# Patient Record
Sex: Female | Born: 1935 | Race: White | Hispanic: No | Smoking: Never smoker
Health system: Southern US, Community
[De-identification: ages and names within clinical notes are randomized; demographics above are authoritative.]

## PROBLEM LIST (undated history)

## (undated) DIAGNOSIS — I10 Essential (primary) hypertension: Secondary | ICD-10-CM

## (undated) DIAGNOSIS — E079 Disorder of thyroid, unspecified: Secondary | ICD-10-CM

## (undated) HISTORY — PX: ABDOMINAL HYSTERECTOMY: SHX81

---

## 2014-09-01 ENCOUNTER — Encounter (HOSPITAL_COMMUNITY): Payer: Self-pay | Admitting: Emergency Medicine

## 2014-09-01 ENCOUNTER — Inpatient Hospital Stay (HOSPITAL_COMMUNITY)
Admission: EM | Admit: 2014-09-01 | Discharge: 2014-09-05 | DRG: 482 | Disposition: A | Discharge status: Skilled Nursing Facility | Payer: Medicare Other | Attending: Internal Medicine | Admitting: Internal Medicine

## 2014-09-01 ENCOUNTER — Emergency Department (HOSPITAL_COMMUNITY): Payer: Medicare Other

## 2014-09-01 DIAGNOSIS — E039 Hypothyroidism, unspecified: Secondary | ICD-10-CM | POA: Diagnosis present

## 2014-09-01 DIAGNOSIS — Y9289 Other specified places as the place of occurrence of the external cause: Secondary | ICD-10-CM

## 2014-09-01 DIAGNOSIS — I10 Essential (primary) hypertension: Secondary | ICD-10-CM | POA: Diagnosis present

## 2014-09-01 DIAGNOSIS — W19XXXA Unspecified fall, initial encounter: Secondary | ICD-10-CM

## 2014-09-01 DIAGNOSIS — S72142A Displaced intertrochanteric fracture of left femur, initial encounter for closed fracture: Secondary | ICD-10-CM | POA: Diagnosis not present

## 2014-09-01 DIAGNOSIS — S72002D Fracture of unspecified part of neck of left femur, subsequent encounter for closed fracture with routine healing: Secondary | ICD-10-CM | POA: Diagnosis not present

## 2014-09-01 DIAGNOSIS — W010XXA Fall on same level from slipping, tripping and stumbling without subsequent striking against object, initial encounter: Secondary | ICD-10-CM | POA: Diagnosis present

## 2014-09-01 DIAGNOSIS — T148XXA Other injury of unspecified body region, initial encounter: Secondary | ICD-10-CM

## 2014-09-01 DIAGNOSIS — E038 Other specified hypothyroidism: Secondary | ICD-10-CM | POA: Diagnosis not present

## 2014-09-01 DIAGNOSIS — S72002A Fracture of unspecified part of neck of left femur, initial encounter for closed fracture: Secondary | ICD-10-CM | POA: Diagnosis not present

## 2014-09-01 DIAGNOSIS — Y93H2 Activity, gardening and landscaping: Secondary | ICD-10-CM | POA: Diagnosis not present

## 2014-09-01 DIAGNOSIS — M25552 Pain in left hip: Secondary | ICD-10-CM | POA: Diagnosis present

## 2014-09-01 HISTORY — DX: Disorder of thyroid, unspecified: E07.9

## 2014-09-01 LAB — CBC WITH DIFFERENTIAL/PLATELET
Basophils Absolute: 0 10*3/uL (ref 0.0–0.1)
Basophils Relative: 0 % (ref 0–1)
EOS ABS: 0 10*3/uL (ref 0.0–0.7)
Eosinophils Relative: 0 % (ref 0–5)
HCT: 41.2 % (ref 36.0–46.0)
Hemoglobin: 13.9 g/dL (ref 12.0–15.0)
LYMPHS PCT: 7 % — AB (ref 12–46)
Lymphs Abs: 1 10*3/uL (ref 0.7–4.0)
MCH: 31 pg (ref 26.0–34.0)
MCHC: 33.7 g/dL (ref 30.0–36.0)
MCV: 91.8 fL (ref 78.0–100.0)
MONOS PCT: 4 % (ref 3–12)
Monocytes Absolute: 0.5 10*3/uL (ref 0.1–1.0)
Neutro Abs: 12 10*3/uL — ABNORMAL HIGH (ref 1.7–7.7)
Neutrophils Relative %: 89 % — ABNORMAL HIGH (ref 43–77)
Platelets: 294 10*3/uL (ref 150–400)
RBC: 4.49 MIL/uL (ref 3.87–5.11)
RDW: 13.6 % (ref 11.5–15.5)
WBC: 13.6 10*3/uL — AB (ref 4.0–10.5)

## 2014-09-01 LAB — BASIC METABOLIC PANEL
Anion gap: 11 (ref 5–15)
BUN: 21 mg/dL — AB (ref 6–20)
CALCIUM: 9.4 mg/dL (ref 8.9–10.3)
CO2: 25 mmol/L (ref 22–32)
Chloride: 104 mmol/L (ref 101–111)
Creatinine, Ser: 1.11 mg/dL — ABNORMAL HIGH (ref 0.44–1.00)
GFR calc Af Amer: 51 mL/min — ABNORMAL LOW (ref >60)
GFR calc non Af Amer: 44 mL/min — ABNORMAL LOW (ref >60)
Glucose, Bld: 118 mg/dL — ABNORMAL HIGH (ref 65–99)
Potassium: 3.9 mmol/L (ref 3.5–5.1)
SODIUM: 140 mmol/L (ref 135–145)

## 2014-09-01 LAB — TSH: TSH: 0.571 u[IU]/mL (ref 0.350–4.500)

## 2014-09-01 MED ORDER — ONDANSETRON HCL 4 MG/2ML IJ SOLN: 4.0000 mg | Freq: Four times a day (QID) | INTRAMUSCULAR | Status: DC | PRN

## 2014-09-01 MED ORDER — SODIUM CHLORIDE 0.9 % IV SOLN
INTRAVENOUS | Status: DC
  Administered 2014-09-01 – 2014-09-04 (×6): via INTRAVENOUS

## 2014-09-01 MED ORDER — HYDROCODONE-ACETAMINOPHEN 5-325 MG PO TABS
1.0000 | ORAL_TABLET | ORAL | Status: AC | PRN
  Administered 2014-09-01: 2 via ORAL
  Filled 2014-09-01: qty 2

## 2014-09-01 MED ORDER — DOXAZOSIN MESYLATE 2 MG PO TABS
4.0000 mg | ORAL_TABLET | Freq: Every day | ORAL | Status: DC
  Administered 2014-09-02 – 2014-09-05 (×4): 4 mg via ORAL
  Filled 2014-09-01 (×2): qty 2
  Filled 2014-09-01: qty 1
  Filled 2014-09-01: qty 2
  Filled 2014-09-01: qty 1
  Filled 2014-09-01: qty 2

## 2014-09-01 MED ORDER — ONDANSETRON HCL 4 MG PO TABS: 4.0000 mg | ORAL_TABLET | Freq: Four times a day (QID) | ORAL | Status: DC | PRN

## 2014-09-01 MED ORDER — LEVOTHYROXINE SODIUM 25 MCG PO TABS: 125.0000 ug | ORAL_TABLET | Freq: Every day | ORAL | Status: DC

## 2014-09-01 MED ORDER — TRIAMTERENE-HCTZ 37.5-25 MG PO TABS
0.5000 | ORAL_TABLET | Freq: Every day | ORAL | Status: DC
  Administered 2014-09-02 – 2014-09-05 (×4): 0.5 via ORAL
  Filled 2014-09-01: qty 1
  Filled 2014-09-01: qty 0.5
  Filled 2014-09-01 (×2): qty 1
  Filled 2014-09-01: qty 0.5
  Filled 2014-09-01: qty 1

## 2014-09-01 MED ORDER — CEFAZOLIN SODIUM-DEXTROSE 2-3 GM-% IV SOLR
2.0000 g | Freq: Once | INTRAVENOUS | Status: AC
  Administered 2014-09-02: 2 g via INTRAVENOUS
  Filled 2014-09-01 (×2): qty 50

## 2014-09-01 MED ORDER — ONDANSETRON HCL 4 MG/2ML IJ SOLN: 4.0000 mg | Freq: Three times a day (TID) | INTRAMUSCULAR | Status: AC | PRN

## 2014-09-01 MED ORDER — POVIDONE-IODINE 10 % EX SOLN
Freq: Once | CUTANEOUS | Status: AC
  Administered 2014-09-01: 23:00:00 via TOPICAL

## 2014-09-01 MED ORDER — LEVOTHYROXINE SODIUM 25 MCG PO TABS
125.0000 ug | ORAL_TABLET | Freq: Every day | ORAL | Status: DC
  Administered 2014-09-02 – 2014-09-05 (×4): 125 ug via ORAL
  Filled 2014-09-01 (×8): qty 1

## 2014-09-01 MED ORDER — MORPHINE SULFATE 2 MG/ML IJ SOLN
2.0000 mg | INTRAMUSCULAR | Status: DC | PRN
  Administered 2014-09-02 – 2014-09-05 (×7): 2 mg via INTRAVENOUS
  Filled 2014-09-01 (×9): qty 1

## 2014-09-01 NOTE — Consult Note (Signed)
Reason for Consult:Fracture of the left hip Referring Physician: ER  Andrea [name] is an 81 y.o. female.  HPI: She was out in her garden tilling the soil.  She stopped to get some fertilizer to till into the soil.  She tripped over a large clod of dirt and fell on her left hip.  She was unable to stand.  X-rays here show an intertrochanteric fracture of the left hip with slight displacement.  She has no other injury.  She is in good health.  She has taken medicine for thyroid and hypertension.  I have gone over the risks and imponderables of hip surgery with her including infection, embolus which could cause death, possible blood transfusion, need for physical therapy and possible post op nursing home placement and anesthesia risks.  She asked appropriate questions and appears to understand.  She agrees to the procedure.  I will place her on the schedule tomorrow.  Past Medical History  Diagnosis Date  . Thyroid disease     Past Surgical History  Procedure Laterality Date  . Abdominal hysterectomy      History reviewed. No pertinent family history.  Social History:  reports that she has never smoked. She does not have any smokeless tobacco history on file. She reports that she does not drink alcohol or use illicit drugs.  Allergies: No Known Allergies  Medications: I have reviewed the patient's current medications.  Results for orders placed or performed during the hospital encounter of 09/01/14 (from the past 48 hour(s))  CBC with Differential/Platelet     Status: Abnormal   Collection Time: 09/01/14  4:16 PM  Result Value Ref Range   WBC 13.6 (H) 4.0 - 10.5 K/uL   RBC 4.49 3.87 - 5.11 MIL/uL   Hemoglobin 13.9 12.0 - 15.0 g/dL   HCT 91.4 78.2 - 95.6 %   MCV 91.8 78.0 - 100.0 fL   MCH 31.0 26.0 - 34.0 pg   MCHC 33.7 30.0 - 36.0 g/dL   RDW 21.3 08.6 - 57.8 %   Platelets 294 150 - 400 K/uL   Neutrophils Relative % 89 (H) 43 - 77 %   Neutro Abs 12.0 (H) 1.7 - 7.7 K/uL   Lymphocytes Relative 7 (L) 12 - 46 %   Lymphs Abs 1.0 0.7 - 4.0 K/uL   Monocytes Relative 4 3 - 12 %   Monocytes Absolute 0.5 0.1 - 1.0 K/uL   Eosinophils Relative 0 0 - 5 %   Eosinophils Absolute 0.0 0.0 - 0.7 K/uL   Basophils Relative 0 0 - 1 %   Basophils Absolute 0.0 0.0 - 0.1 K/uL    Dg Chest 1 View  09/01/2014   CLINICAL DATA:  Left hip fracture.  Fall today.  EXAM: CHEST  1 VIEW  COMPARISON:  None.  FINDINGS: Bony demineralization.  Degenerative spurring at both [facility] joints.  Atherosclerotic calcification and tortuosity of the thoracic aorta. Apical lordotic projection. Upper normal heart size. Faint interstitial accentuation peripherally in both lungs. Thoracic spondylosis.  IMPRESSION: 1. Peripheral interstitial accentuation in both lungs could reflect mild fibrosis but is technically nonspecific. 2. Atherosclerotic aortic arch. 3. Degenerative AC joint arthropathy bilaterally.   Electronically Signed   By: Gaylyn Rong M.D.   On: 09/01/2014 15:28   Dg Tibia/fibula Left  09/01/2014   CLINICAL DATA:  Left hip pain radiating down the leg status post fall in the garden striking the left hip  EXAM: LEFT TIBIA AND FIBULA - 2 VIEW  COMPARISON:  None.  FINDINGS: The left tibia and fibula are adequately mineralized. There is no acute fracture. There is moderate degenerative change of all 3 joint compartments of the left knee. The left ankle is only partially included in the field of view. The soft tissues of the leg are normal.  IMPRESSION: There is moderate tricompartmental degenerative change of the left knee. There is no acute bony abnormality of the left tibia or fibula. The tip of the medial malleolus is excluded from the field of view but is included on an accompanying ankle series   Electronically Signed   By: David  [place] M.D.   On: 09/01/2014 15:28   Dg Ankle Complete Left  09/01/2014   CLINICAL DATA:  Left hip pain which radiates down left leg  EXAM: LEFT ANKLE COMPLETE - 3+ VIEW   COMPARISON:  None.  FINDINGS: There is no evidence of fracture, dislocation, or joint effusion. There is no evidence of arthropathy or other focal bone abnormality. Soft tissues are unremarkable.  IMPRESSION: Negative.   Electronically Signed   By: Signa Kell M.D.   On: 09/01/2014 15:29   Ct Head Wo Contrast  09/01/2014   CLINICAL DATA:  Pain following fall  EXAM: CT HEAD WITHOUT CONTRAST  TECHNIQUE: Contiguous axial images were obtained from the base of the skull through the vertex without intravenous contrast.  COMPARISON:  None.  FINDINGS: There is mild diffuse atrophy. There is no intracranial mass, hemorrhage, extra-axial fluid collection, or midline shift. There is patchy small vessel disease throughout the centra semiovale bilaterally. No acute infarct is apparent. The bony calvarium appears intact. The mastoid air cells are clear. There is mild mucosal thickening in each maxillary antra, more on the right than on the left.  IMPRESSION: Atrophy with patchy small vessel disease in the centra semiovale bilaterally. No intracranial mass, hemorrhage, or extra-axial fluid collection. No acute appearing infarct. Mild paranasal sinus disease in the maxillary antra.   Electronically Signed   By: Bretta Bang III M.D.   On: 09/01/2014 14:26   Dg Hip Unilat With Pelvis 1v Left  09/01/2014   CLINICAL DATA:  Left hip pain radiating down the left leg.  Fall.  EXAM: LEFT HIP (WITH PELVIS) 1 VIEW  COMPARISON:  None.  FINDINGS: Acute intertrochanteric fracture of the left proximal femur. The medial portion of this fractures strays close to the base of the femoral neck.  Lower lumbar spondylosis and degenerative disc disease.  Bony demineralization.  IMPRESSION: 1. Acute left intertrochanteric hip fracture. This strays close to the femoral neck margin medially. 2. Bony demineralization and the hip fracture indicate osteoporosis. 3. Lower lumbar spondylosis and degenerative disc disease.   Electronically Signed    By: Gaylyn Rong M.D.   On: 09/01/2014 15:27   Dg Femur Min 2 Views Left  09/01/2014   CLINICAL DATA:  Left hip pain after falling today. Initial encounter.  EXAM: LEFT FEMUR 2 VIEWS  COMPARISON:  None.  FINDINGS: The bones are demineralized. There is a mildly displaced intertrochanteric left femur fracture. The femoral head is intact without dislocation. There is no evidence of pelvic fracture. Degenerative changes are noted within the lower lumbar spine and at the left knee.  IMPRESSION: Mildly displaced intertrochanteric left femur fracture.   Electronically Signed   By: Carey Bullocks M.D.   On: 09/01/2014 15:26    Review of Systems  Cardiovascular:       Long history of hypertension well controlled.  Musculoskeletal: Positive for falls (today  in garden.  Fell on left hip.  No other injury.).  Endo/Heme/Allergies:       Takes thyroid medicine.  All other systems reviewed and are negative.  Blood pressure 131/56, pulse 80, temperature 97.9 F (36.6 C), temperature source Oral, resp. rate 16, height 5\' 5"  (1.651 m), weight 81.647 kg (180 lb), SpO2 96 %. Physical Exam  Constitutional: She is oriented to person, place, and time. She appears well-developed and well-nourished.  HENT:  Head: Normocephalic and atraumatic.  Eyes: Conjunctivae and EOM are normal. Pupils are equal, round, and reactive to light.  Neck: Normal range of motion. Neck supple.  Cardiovascular: Normal rate, regular rhythm and intact distal pulses.   Respiratory: Effort normal.  GI: Soft.  Musculoskeletal: She exhibits tenderness (Pain of left hip with motion but no shortening or excessive rotation.  NV is intact.).  Neurological: She is alert and oriented to person, place, and time. She has normal reflexes.  Skin: Skin is warm and dry.  Psychiatric: She has a normal mood and affect. Her behavior is normal. Judgment and thought content normal.    Assessment/Plan: Intertrochanteric fracture of the left hip,  closed, slightly displaced for surgery repair tomorrow.  Andrea [name] 09/01/2014, 5:10 PM

## 2014-09-01 NOTE — ED Notes (Signed)
Fall in garden.  Fell on left hip.  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  Has pain with movement to left hip, rates 6.

## 2014-09-01 NOTE — ED Provider Notes (Signed)
CSN: [id number]     Arrival date & time 09/01/14  1252 History   First MD Initiated Contact with Patient 09/01/14 1319     Chief Complaint  Patient presents with  . Fall     (Consider location/radiation/quality/duration/timing/severity/associated sxs/prior Treatment) HPI Comments: Patient presents via EMS after a fall. 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 Uncertain if she hit her head. Denies losing consciousness. Complains of pain initially to left ankle but now pain left thigh and left hip. Has not tried ambulating since. Denies any head, neck, back, chest or abdominal pain. She denies any other medical problems. She denies taking any medications. No blood in her use. Denies any dizziness or lightheadedness.  The history is provided by the patient and the EMS personnel.    Past Medical History  Diagnosis Date  . Thyroid disease    Past Surgical History  Procedure Laterality Date  . Abdominal hysterectomy     History reviewed. No pertinent family history. History  Substance Use Topics  . Smoking status: Never Smoker   . Smokeless tobacco: Not on file  . Alcohol Use: No   OB History    No data available     Review of Systems  Constitutional: Negative for fever, activity change and appetite change.  HENT: Negative for congestion and rhinorrhea.   Respiratory: Negative for cough, chest tightness and shortness of breath.   Cardiovascular: Negative for chest pain.  Gastrointestinal: Negative for nausea, vomiting and abdominal pain.  Genitourinary: Negative for dysuria, hematuria, vaginal bleeding and vaginal discharge.  Musculoskeletal: Positive for myalgias and arthralgias. Negative for back pain and neck pain.  Skin: Negative for pallor and wound.  Neurological: Negative for dizziness, syncope, weakness and headaches.  A complete 10 system review of systems was obtained and all  systems are negative except as noted in the HPI and PMH.      Allergies  Review of patient's allergies indicates no known allergies.  Home Medications   Prior to Admission medications   Medication Sig Start Date End Date Taking? Authorizing Provider  doxazosin (CARDURA) 4 MG tablet Take 4 mg by mouth daily. 06/13/14   Historical Provider, MD  levothyroxine (SYNTHROID, LEVOTHROID) 125 MCG tablet Take 125 mcg by mouth daily. 06/13/14   Historical Provider, MD  triamterene-hydrochlorothiazide (MAXZIDE-25) 37.5-25 MG per tablet Take 0.5 tablets by mouth daily. 06/13/14   Historical Provider, MD   BP 131/56 mmHg  Pulse 80  Temp(Src) 97.9 F (36.6 C) (Oral)  Resp 16  Ht 5\' 5"  (1.651 m)  Wt 180 lb (81.647 kg)  BMI 29.95 kg/m2  SpO2 96% Physical Exam  Constitutional: She is oriented to person, place, and time. She appears well-developed and well-nourished. No distress.  HENT:  Head: Normocephalic and atraumatic.  Mouth/Throat: Oropharynx is clear and moist. No oropharyngeal exudate.  Eyes: Conjunctivae and EOM are normal. Pupils are equal, round, and reactive to light.  Neck: Normal range of motion. Neck supple.  No C spine tenderness  Cardiovascular: Normal rate, regular rhythm, normal heart sounds and intact distal pulses.   No murmur heard. Pulmonary/Chest: Effort normal and breath sounds normal. No respiratory distress.  Abdominal: Soft. There is no tenderness. There is no rebound and no guarding.  Musculoskeletal: Normal range of motion. She exhibits no edema or tenderness.  No T or L spine tenderness Tenderness to left lateral thigh and hip. No ecchymosis.  Patient able to range with minimal difficulty. Range of motion of left hip, knee, ankle intact. Intact DP and PT pulses. No appreciable shortening or external rotation. Full range of motion of hip, knee and ankle on right side.  Neurological: She is alert and oriented to person, place, and time. No cranial nerve deficit. She  exhibits normal muscle tone. Coordination normal.  No ataxia on finger to nose bilaterally. No pronator drift. 5/5 strength throughout. CN 2-12 intact.Equal grip strength. Sensation intact.   Skin: Skin is warm.  Psychiatric: She has a normal mood and affect. Her behavior is normal.  Nursing note and vitals reviewed.   ED Course  Procedures (including critical care time) Labs Review Labs Reviewed  CBC WITH DIFFERENTIAL/PLATELET  BASIC METABOLIC PANEL    Imaging Review Dg Chest 1 View  09/01/2014   CLINICAL DATA:  Left hip fracture.  Fall today.  EXAM: CHEST  1 VIEW  COMPARISON:  None.  FINDINGS: Bony demineralization.  Degenerative spurring at both [facility] joints.  Atherosclerotic calcification and tortuosity of the thoracic aorta. Apical lordotic projection. Upper normal heart size. Faint interstitial accentuation peripherally in both lungs. Thoracic spondylosis.  IMPRESSION: 1. Peripheral interstitial accentuation in both lungs could reflect mild fibrosis but is technically nonspecific. 2. Atherosclerotic aortic arch. 3. Degenerative AC joint arthropathy bilaterally.   Electronically Signed   By: Gaylyn Rong M.D.   On: 09/01/2014 15:28   Dg Tibia/fibula Left  09/01/2014   CLINICAL DATA:  Left hip pain radiating down the leg status post fall in the garden striking the left hip  EXAM: LEFT TIBIA AND FIBULA - 2 VIEW  COMPARISON:  None.  FINDINGS: The left tibia and fibula are adequately mineralized. There is no acute fracture. There is moderate degenerative change of all 3 joint compartments of the left knee. The left ankle is only partially included in the field of view. The soft tissues of the leg are normal.  IMPRESSION: There is moderate tricompartmental degenerative change of the left knee. There is no acute bony abnormality of the left tibia or fibula. The tip of the medial malleolus is excluded from the field of view but is included on an accompanying ankle series   Electronically Signed    By: David  [place] M.D.   On: 09/01/2014 15:28   Dg Ankle Complete Left  09/01/2014   CLINICAL DATA:  Left hip pain which radiates down left leg  EXAM: LEFT ANKLE COMPLETE - 3+ VIEW  COMPARISON:  None.  FINDINGS: There is no evidence of fracture, dislocation, or joint effusion. There is no evidence of arthropathy or other focal bone abnormality. Soft tissues are unremarkable.  IMPRESSION: Negative.   Electronically Signed   By: Signa Kell M.D.   On: 09/01/2014 15:29   Ct Head Wo Contrast  09/01/2014   CLINICAL DATA:  Pain following fall  EXAM: CT HEAD WITHOUT CONTRAST  TECHNIQUE: Contiguous axial images were obtained from the base of the skull through the vertex without intravenous contrast.  COMPARISON:  None.  FINDINGS: There is mild diffuse atrophy. There is no intracranial mass, hemorrhage, extra-axial fluid collection, or midline shift. There is patchy small vessel disease throughout the centra semiovale bilaterally. No acute infarct is apparent. The bony calvarium appears intact. The mastoid air cells are clear. There is mild mucosal thickening in each maxillary antra, more on the right than on the left.  IMPRESSION: Atrophy with patchy small vessel disease in the centra semiovale bilaterally. No intracranial mass, hemorrhage, or  extra-axial fluid collection. No acute appearing infarct. Mild paranasal sinus disease in the maxillary antra.   Electronically Signed   By: Bretta Bang III M.D.   On: 09/01/2014 14:26   Dg Hip Unilat With Pelvis 1v Left  09/01/2014   CLINICAL DATA:  Left hip pain radiating down the left leg.  Fall.  EXAM: LEFT HIP (WITH PELVIS) 1 VIEW  COMPARISON:  None.  FINDINGS: Acute intertrochanteric fracture of the left proximal femur. The medial portion of this fractures strays close to the base of the femoral neck.  Lower lumbar spondylosis and degenerative disc disease.  Bony demineralization.  IMPRESSION: 1. Acute left intertrochanteric hip fracture. This strays close to the  femoral neck margin medially. 2. Bony demineralization and the hip fracture indicate osteoporosis. 3. Lower lumbar spondylosis and degenerative disc disease.   Electronically Signed   By: Gaylyn Rong M.D.   On: 09/01/2014 15:27   Dg Femur Min 2 Views Left  09/01/2014   CLINICAL DATA:  Left hip pain after falling today. Initial encounter.  EXAM: LEFT FEMUR 2 VIEWS  COMPARISON:  None.  FINDINGS: The bones are demineralized. There is a mildly displaced intertrochanteric left femur fracture. The femoral head is intact without dislocation. There is no evidence of pelvic fracture. Degenerative changes are noted within the lower lumbar spine and at the left knee.  IMPRESSION: Mildly displaced intertrochanteric left femur fracture.   Electronically Signed   By: Carey Bullocks M.D.   On: 09/01/2014 15:26     EKG Interpretation None      MDM   Final diagnoses:  Fall   Mechanical fall with left hip and thigh pain. Questionable loss of consciousness.  Imaging remarkable for left intertrochanteric hip fracture. NV intact.  Patient refusing pain meds in the ED.  Discussed with Dr. Hilda Lias. He will plan on surgery tomorrow. Admission d.w Dr. Karilyn Cota.   Glynn Octave, MD 09/01/14 [phone]

## 2014-09-01 NOTE — H&P (Signed)
[facility] History and Physical  Andrea [name] ZOX:[id number] DOB: 11/04/1925 DOA: 09/01/2014  Referring physician: ER PCP: Pcp Not In System   Chief Complaint: Left hip pain  HPI: Andrea [name] is a 81 y.o. female  This is a very pleasant 81 year old lady who was out in her garden telling the swell. She tripped over a large clod of dirt and fell on to her left hip. She was unable to stand. She denied any chest pain, palpitations, dyspnea or syncopal event. There was no head injury. When she presented to the emergency room, x-rays showed presence of a left hip fracture. There was slight displacement. She is now being admitted for further management. Orthopedics, Dr. Hilda LiasKeeling, has already seen the patient and will take her to the operating room tomorrow.   Review of Systems:  Apart from symptoms above, all systems negative.  Past Medical History  Diagnosis Date  . Thyroid disease    Past Surgical History  Procedure Laterality Date  . Abdominal hysterectomy     Social History:  reports that she has never smoked. She does not have any smokeless tobacco history on file. She reports that she does not drink alcohol or use illicit drugs.  No Known Allergies   Family history: She has had 3 siblings all of whom died from some sort of malignancy.   Prior to Admission medications   Medication Sig Start Date End Date Taking? Authorizing Provider  doxazosin (CARDURA) 4 MG tablet Take 4 mg by mouth daily. 06/13/14  Yes Historical Provider, MD  levothyroxine (SYNTHROID, LEVOTHROID) 125 MCG tablet Take 125 mcg by mouth daily. 06/13/14  Yes Historical Provider, MD  naproxen sodium (ANAPROX) 220 MG tablet Take 220-440 mg by mouth daily as needed (for artritis pain).   Yes Historical Provider, MD  triamterene-hydrochlorothiazide (MAXZIDE-25) 37.5-25 MG per tablet Take 0.5 tablets by mouth daily. 06/13/14  Yes Historical Provider, MD   Physical Exam: Filed Vitals:   09/01/14 1301 09/01/14 1549    BP: 115/45 131/56  Pulse: 77 80  Temp: 97.9 F (36.6 C)   TempSrc: Oral   Resp: 16 16  Height: 5\' 5"  (1.651 m)   Weight: 81.647 kg (180 lb)   SpO2: 95% 96%    Wt Readings from Last 3 Encounters:  09/01/14 81.647 kg (180 lb)    General:  Appears calm and comfortable. She does not appear to be in significant pain. She is alert and oriented. Eyes: PERRL, normal lids, irises & conjunctiva ENT: grossly normal hearing, lips & tongue Neck: no LAD, masses or thyromegaly Cardiovascular: RRR, no m/r/g. No LE edema. Telemetry: SR, no arrhythmias  Respiratory: CTA bilaterally, no w/r/r. Normal respiratory effort. Abdomen: soft, ntnd Skin: no rash or induration seen on limited exam Musculoskeletal: Tender in the left hip area but no significant shortening or internal rotation. Psychiatric: grossly normal mood and affect, speech fluent and appropriate Neurologic: grossly non-focal.          Labs on Admission:  Basic Metabolic Panel:  Recent Labs Lab 09/01/14 1616  NA 140  K 3.9  CL 104  CO2 25  GLUCOSE 118*  BUN 21*  CREATININE 1.11*  CALCIUM 9.4   Liver Function Tests: No results for input(s): AST, ALT, ALKPHOS, BILITOT, PROT, ALBUMIN in the last 168 hours. No results for input(s): LIPASE, AMYLASE in the last 168 hours. No results for input(s): AMMONIA in the last 168 hours. CBC:  Recent Labs Lab 09/01/14 1616  WBC 13.6*  NEUTROABS 12.0*  HGB  13.9  HCT 41.2  MCV 91.8  PLT 294   Cardiac Enzymes: No results for input(s): CKTOTAL, CKMB, CKMBINDEX, TROPONINI in the last 168 hours.  BNP (last 3 results) No results for input(s): BNP in the last 8760 hours.  ProBNP (last 3 results) No results for input(s): PROBNP in the last 8760 hours.  CBG: No results for input(s): GLUCAP in the last 168 hours.  Radiological Exams on Admission: Dg Chest 1 View  09/01/2014   CLINICAL DATA:  Left hip fracture.  Fall today.  EXAM: CHEST  1 VIEW  COMPARISON:  None.  FINDINGS:  Bony demineralization.  Degenerative spurring at both [facility] joints.  Atherosclerotic calcification and tortuosity of the thoracic aorta. Apical lordotic projection. Upper normal heart size. Faint interstitial accentuation peripherally in both lungs. Thoracic spondylosis.  IMPRESSION: 1. Peripheral interstitial accentuation in both lungs could reflect mild fibrosis but is technically nonspecific. 2. Atherosclerotic aortic arch. 3. Degenerative AC joint arthropathy bilaterally.   Electronically Signed   By: Gaylyn Rong M.D.   On: 09/01/2014 15:28   Dg Tibia/fibula Left  09/01/2014   CLINICAL DATA:  Left hip pain radiating down the leg status post fall in the garden striking the left hip  EXAM: LEFT TIBIA AND FIBULA - 2 VIEW  COMPARISON:  None.  FINDINGS: The left tibia and fibula are adequately mineralized. There is no acute fracture. There is moderate degenerative change of all 3 joint compartments of the left knee. The left ankle is only partially included in the field of view. The soft tissues of the leg are normal.  IMPRESSION: There is moderate tricompartmental degenerative change of the left knee. There is no acute bony abnormality of the left tibia or fibula. The tip of the medial malleolus is excluded from the field of view but is included on an accompanying ankle series   Electronically Signed   By: David  Swaziland M.D.   On: 09/01/2014 15:28   Dg Ankle Complete Left  09/01/2014   CLINICAL DATA:  Left hip pain which radiates down left leg  EXAM: LEFT ANKLE COMPLETE - 3+ VIEW  COMPARISON:  None.  FINDINGS: There is no evidence of fracture, dislocation, or joint effusion. There is no evidence of arthropathy or other focal bone abnormality. Soft tissues are unremarkable.  IMPRESSION: Negative.   Electronically Signed   By: Signa Kell M.D.   On: 09/01/2014 15:29   Ct Head Wo Contrast  09/01/2014   CLINICAL DATA:  Pain following fall  EXAM: CT HEAD WITHOUT CONTRAST  TECHNIQUE: Contiguous axial images  were obtained from the base of the skull through the vertex without intravenous contrast.  COMPARISON:  None.  FINDINGS: There is mild diffuse atrophy. There is no intracranial mass, hemorrhage, extra-axial fluid collection, or midline shift. There is patchy small vessel disease throughout the centra semiovale bilaterally. No acute infarct is apparent. The bony calvarium appears intact. The mastoid air cells are clear. There is mild mucosal thickening in each maxillary antra, more on the right than on the left.  IMPRESSION: Atrophy with patchy small vessel disease in the centra semiovale bilaterally. No intracranial mass, hemorrhage, or extra-axial fluid collection. No acute appearing infarct. Mild paranasal sinus disease in the maxillary antra.   Electronically Signed   By: Bretta Bang III M.D.   On: 09/01/2014 14:26   Dg Hip Unilat With Pelvis 1v Left  09/01/2014   CLINICAL DATA:  Left hip pain radiating down the left leg.  Fall.  EXAM: LEFT  HIP (WITH PELVIS) 1 VIEW  COMPARISON:  None.  FINDINGS: Acute intertrochanteric fracture of the left proximal femur. The medial portion of this fractures strays close to the base of the femoral neck.  Lower lumbar spondylosis and degenerative disc disease.  Bony demineralization.  IMPRESSION: 1. Acute left intertrochanteric hip fracture. This strays close to the femoral neck margin medially. 2. Bony demineralization and the hip fracture indicate osteoporosis. 3. Lower lumbar spondylosis and degenerative disc disease.   Electronically Signed   By: Gaylyn Rong M.D.   On: 09/01/2014 15:27   Dg Femur Min 2 Views Left  09/01/2014   CLINICAL DATA:  Left hip pain after falling today. Initial encounter.  EXAM: LEFT FEMUR 2 VIEWS  COMPARISON:  None.  FINDINGS: The bones are demineralized. There is a mildly displaced intertrochanteric left femur fracture. The femoral head is intact without dislocation. There is no evidence of pelvic fracture. Degenerative changes are  noted within the lower lumbar spine and at the left knee.  IMPRESSION: Mildly displaced intertrochanteric left femur fracture.   Electronically Signed   By: Carey Bullocks M.D.   On: 09/01/2014 15:26      Assessment/Plan   1. Fracture of the left hip. We will provide analgesia as required. Orthopedics, Dr. Hilda Lias will take her to surgery tomorrow. Nothing by mouth from midnight. 2. Hypertension. Stable. Continue with home medications. 3. Hypothyroidism. Continue with replacement thyroid medications.  Further recommendations will depend on patient's hospital progress.   Code Status: Full code  DVT Prophylaxis: SCDs.  Family Communication: I discussed the plan with the patient at the bedside.   Disposition Plan: Depending on progress. She may need placement for rehabilitation in the short-term.   Time spent: 45 minutes.  Wilson Singer [facility] Pager [phone].

## 2014-09-02 ENCOUNTER — Inpatient Hospital Stay (HOSPITAL_COMMUNITY): Payer: Medicare Other

## 2014-09-02 ENCOUNTER — Encounter (HOSPITAL_COMMUNITY): Payer: Self-pay

## 2014-09-02 ENCOUNTER — Inpatient Hospital Stay (HOSPITAL_COMMUNITY): Payer: Medicare Other | Admitting: Anesthesiology

## 2014-09-02 ENCOUNTER — Encounter (HOSPITAL_COMMUNITY)
Admission: EM | Disposition: A | Discharge status: Skilled Nursing Facility | Payer: Self-pay | Source: Home / Self Care | Attending: Internal Medicine

## 2014-09-02 DIAGNOSIS — E038 Other specified hypothyroidism: Secondary | ICD-10-CM

## 2014-09-02 DIAGNOSIS — S72002D Fracture of unspecified part of neck of left femur, subsequent encounter for closed fracture with routine healing: Secondary | ICD-10-CM

## 2014-09-02 DIAGNOSIS — I10 Essential (primary) hypertension: Secondary | ICD-10-CM

## 2014-09-02 HISTORY — PX: ORIF HIP FRACTURE: SHX2125

## 2014-09-02 LAB — COMPREHENSIVE METABOLIC PANEL
ALBUMIN: 3.3 g/dL — AB (ref 3.5–5.0)
ALT: 25 U/L (ref 14–54)
AST: 37 U/L (ref 15–41)
Alkaline Phosphatase: 71 U/L (ref 38–126)
Anion gap: 8 (ref 5–15)
BUN: 22 mg/dL — AB (ref 6–20)
CALCIUM: 8.8 mg/dL — AB (ref 8.9–10.3)
CO2: 26 mmol/L (ref 22–32)
Chloride: 107 mmol/L (ref 101–111)
Creatinine, Ser: 1.14 mg/dL — ABNORMAL HIGH (ref 0.44–1.00)
GFR calc Af Amer: 49 mL/min — ABNORMAL LOW (ref >60)
GFR calc non Af Amer: 42 mL/min — ABNORMAL LOW (ref >60)
Glucose, Bld: 115 mg/dL — ABNORMAL HIGH (ref 65–99)
Potassium: 4.1 mmol/L (ref 3.5–5.1)
SODIUM: 141 mmol/L (ref 135–145)
Total Bilirubin: 1.1 mg/dL (ref 0.3–1.2)
Total Protein: 6 g/dL — ABNORMAL LOW (ref 6.5–8.1)

## 2014-09-02 LAB — CBC WITH DIFFERENTIAL/PLATELET
BASOS ABS: 0 10*3/uL (ref 0.0–0.1)
BASOS PCT: 1 % (ref 0–1)
Eosinophils Absolute: 0 10*3/uL (ref 0.0–0.7)
Eosinophils Relative: 0 % (ref 0–5)
HEMATOCRIT: 39.8 % (ref 36.0–46.0)
Hemoglobin: 13.1 g/dL (ref 12.0–15.0)
LYMPHS ABS: 1.3 10*3/uL (ref 0.7–4.0)
LYMPHS PCT: 20 % (ref 12–46)
MCH: 30.5 pg (ref 26.0–34.0)
MCHC: 32.9 g/dL (ref 30.0–36.0)
MCV: 92.8 fL (ref 78.0–100.0)
MONO ABS: 0.3 10*3/uL (ref 0.1–1.0)
Monocytes Relative: 5 % (ref 3–12)
Neutro Abs: 4.9 10*3/uL (ref 1.7–7.7)
Neutrophils Relative %: 74 % (ref 43–77)
Platelets: 259 10*3/uL (ref 150–400)
RBC: 4.29 MIL/uL (ref 3.87–5.11)
RDW: 13.8 % (ref 11.5–15.5)
WBC: 6.6 10*3/uL (ref 4.0–10.5)

## 2014-09-02 LAB — PREPARE RBC (CROSSMATCH)

## 2014-09-02 LAB — SURGICAL PCR SCREEN
MRSA, PCR: NEGATIVE
STAPHYLOCOCCUS AUREUS: NEGATIVE

## 2014-09-02 LAB — ABO/RH: ABO/RH(D): O NEG

## 2014-09-02 SURGERY — OPEN REDUCTION INTERNAL FIXATION HIP
Anesthesia: Spinal | Site: Hip | Laterality: Left

## 2014-09-02 MED ORDER — ALBUTEROL SULFATE (2.5 MG/3ML) 0.083% IN NEBU
2.5000 mg | INHALATION_SOLUTION | Freq: Four times a day (QID) | RESPIRATORY_TRACT | Status: DC
  Administered 2014-09-02 – 2014-09-05 (×8): 2.5 mg via RESPIRATORY_TRACT
  Filled 2014-09-02 (×8): qty 3

## 2014-09-02 MED ORDER — MAGNESIUM HYDROXIDE 400 MG/5ML PO SUSP: 30.0000 mL | Freq: Every day | ORAL | Status: DC | PRN

## 2014-09-02 MED ORDER — MIDAZOLAM HCL 5 MG/5ML IJ SOLN
INTRAMUSCULAR | Status: DC | PRN
  Administered 2014-09-02 (×2): 1 mg via INTRAVENOUS

## 2014-09-02 MED ORDER — BUPIVACAINE IN DEXTROSE 0.75-8.25 % IT SOLN
INTRATHECAL | Status: DC | PRN
  Administered 2014-09-02: 15 mg via INTRATHECAL

## 2014-09-02 MED ORDER — HYDROGEN PEROXIDE 3 % EX SOLN
CUTANEOUS | Status: DC | PRN
  Administered 2014-09-02: 1 via TOPICAL

## 2014-09-02 MED ORDER — FENTANYL CITRATE (PF) 100 MCG/2ML IJ SOLN
INTRAMUSCULAR | Status: AC
  Filled 2014-09-02: qty 2

## 2014-09-02 MED ORDER — LACTATED RINGERS IV SOLN
INTRAVENOUS | Status: DC
  Administered 2014-09-02 (×2): via INTRAVENOUS

## 2014-09-02 MED ORDER — FENTANYL CITRATE (PF) 100 MCG/2ML IJ SOLN
INTRAMUSCULAR | Status: DC | PRN
  Administered 2014-09-02: 20 ug via INTRATHECAL

## 2014-09-02 MED ORDER — PROMETHAZINE HCL 25 MG/ML IJ SOLN: 12.5000 mg | INTRAMUSCULAR | Status: DC | PRN

## 2014-09-02 MED ORDER — EPHEDRINE SULFATE 50 MG/ML IJ SOLN
INTRAMUSCULAR | Status: AC
  Filled 2014-09-02: qty 1

## 2014-09-02 MED ORDER — LIDOCAINE HCL (CARDIAC) 10 MG/ML IV SOLN
INTRAVENOUS | Status: DC | PRN
  Administered 2014-09-02: 25 mg via INTRAVENOUS

## 2014-09-02 MED ORDER — SODIUM CHLORIDE 0.9 % IJ SOLN
INTRAMUSCULAR | Status: AC
  Filled 2014-09-02: qty 20

## 2014-09-02 MED ORDER — ONDANSETRON HCL 4 MG/2ML IJ SOLN
4.0000 mg | Freq: Once | INTRAMUSCULAR | Status: AC
  Administered 2014-09-02: 4 mg via INTRAVENOUS
  Filled 2014-09-02: qty 2

## 2014-09-02 MED ORDER — BUPIVACAINE IN DEXTROSE 0.75-8.25 % IT SOLN
INTRATHECAL | Status: AC
  Filled 2014-09-02: qty 2

## 2014-09-02 MED ORDER — ENOXAPARIN SODIUM 40 MG/0.4ML ~~LOC~~ SOLN
40.0000 mg | SUBCUTANEOUS | Status: DC
  Administered 2014-09-03 – 2014-09-05 (×3): 40 mg via SUBCUTANEOUS
  Filled 2014-09-02 (×3): qty 0.4

## 2014-09-02 MED ORDER — FENTANYL CITRATE (PF) 100 MCG/2ML IJ SOLN: 25.0000 ug | INTRAMUSCULAR | Status: DC | PRN

## 2014-09-02 MED ORDER — MIDAZOLAM HCL 2 MG/2ML IJ SOLN
1.0000 mg | INTRAMUSCULAR | Status: DC | PRN
  Administered 2014-09-02: 2 mg via INTRAVENOUS
  Filled 2014-09-02: qty 2

## 2014-09-02 MED ORDER — SODIUM CHLORIDE 0.9 % IV SOLN: Freq: Once | INTRAVENOUS | Status: DC

## 2014-09-02 MED ORDER — ONDANSETRON HCL 4 MG/2ML IJ SOLN: 4.0000 mg | Freq: Once | INTRAMUSCULAR | Status: DC | PRN

## 2014-09-02 MED ORDER — FENTANYL CITRATE (PF) 100 MCG/2ML IJ SOLN
25.0000 ug | INTRAMUSCULAR | Status: DC
  Administered 2014-09-02: 25 ug via INTRAVENOUS
  Filled 2014-09-02: qty 2

## 2014-09-02 MED ORDER — PROPOFOL INFUSION 10 MG/ML OPTIME
INTRAVENOUS | Status: DC | PRN
  Administered 2014-09-02: 25 ug/kg/min via INTRAVENOUS

## 2014-09-02 MED ORDER — MIDAZOLAM HCL 2 MG/2ML IJ SOLN
INTRAMUSCULAR | Status: AC
  Filled 2014-09-02: qty 2

## 2014-09-02 MED ORDER — EPHEDRINE SULFATE 50 MG/ML IJ SOLN
INTRAMUSCULAR | Status: DC | PRN
  Administered 2014-09-02: 10 mg via INTRAVENOUS

## 2014-09-02 MED ORDER — SODIUM CHLORIDE 0.9 % IR SOLN
Status: DC | PRN
  Administered 2014-09-02: 1000 mL

## 2014-09-02 SURGICAL SUPPLY — 54 items
BAG HAMPER (MISCELLANEOUS) ×3 IMPLANT
BIT DRILL TWIST 3.5MM (BIT) ×2 IMPLANT
BLADE 10 SAFETY STRL DISP (BLADE) ×6 IMPLANT
BLADE HEX COATED 2.75 (ELECTRODE) ×3 IMPLANT
BLADE SURG SZ20 CARB STEEL (BLADE) ×3 IMPLANT
CLOTH BEACON ORANGE TIMEOUT ST (SAFETY) ×3 IMPLANT
COVER LIGHT HANDLE STERIS (MISCELLANEOUS) ×6 IMPLANT
COVER MAYO STAND XLG (DRAPE) ×3 IMPLANT
DRAPE STERI IOBAN 125X83 (DRAPES) ×3 IMPLANT
DRILL TWIST 3.5MM (BIT) ×6
ELECT REM PT RETURN 9FT ADLT (ELECTROSURGICAL) ×3
ELECTRODE REM PT RTRN 9FT ADLT (ELECTROSURGICAL) ×1 IMPLANT
EVACUATOR 3/16  PVC DRAIN (DRAIN) ×2
EVACUATOR 3/16 PVC DRAIN (DRAIN) ×1 IMPLANT
GAUZE SPONGE 4X4 12PLY STRL (GAUZE/BANDAGES/DRESSINGS) ×2 IMPLANT
GAUZE XEROFORM 5X9 LF (GAUZE/BANDAGES/DRESSINGS) ×3 IMPLANT
GLOVE BIO SURGEON STRL SZ7 (GLOVE) ×6 IMPLANT
GLOVE BIO SURGEON STRL SZ8 (GLOVE) ×3 IMPLANT
GLOVE BIO SURGEON STRL SZ8.5 (GLOVE) ×3 IMPLANT
GLOVE BIOGEL PI IND STRL 7.0 (GLOVE) ×2 IMPLANT
GLOVE BIOGEL PI IND STRL 7.5 (GLOVE) ×1 IMPLANT
GLOVE BIOGEL PI INDICATOR 7.0 (GLOVE) ×4
GLOVE BIOGEL PI INDICATOR 7.5 (GLOVE) ×2
GOWN STRL REUS W/TWL LRG LVL3 (GOWN DISPOSABLE) ×6 IMPLANT
GOWN STRL REUS W/TWL XL LVL3 (GOWN DISPOSABLE) ×3 IMPLANT
GUIDE PIN CALIBRATED (PIN) ×3 IMPLANT
INST SET MAJOR BONE (KITS) ×3 IMPLANT
KIT BLADEGUARD II DBL (SET/KITS/TRAYS/PACK) ×3 IMPLANT
KIT ROOM TURNOVER AP CYSTO (KITS) ×3 IMPLANT
MANIFOLD NEPTUNE II (INSTRUMENTS) ×3 IMPLANT
MARKER SKIN DUAL TIP RULER LAB (MISCELLANEOUS) ×3 IMPLANT
NS IRRIG 1000ML POUR BTL (IV SOLUTION) ×3 IMPLANT
PACK BASIC III (CUSTOM PROCEDURE TRAY) ×2
PACK SRG BSC III STRL LF ECLPS (CUSTOM PROCEDURE TRAY) ×1 IMPLANT
PAD ABD 5X9 TENDERSORB (GAUZE/BANDAGES/DRESSINGS) ×3 IMPLANT
PAD ARMBOARD 7.5X6 YLW CONV (MISCELLANEOUS) ×3 IMPLANT
PENCIL HANDSWITCHING (ELECTRODE) ×3 IMPLANT
PLATE SHORT BARREL 135X4 (Plate) ×3 IMPLANT
SCREW CORTICAL SFTP 4.5X36MM (Screw) ×6 IMPLANT
SCREW CORTICAL SFTP 4.5X38MM (Screw) ×3 IMPLANT
SCREW CORTICAL SFTP 4.5X42MM (Screw) ×3 IMPLANT
SCREW LAG 85MM (Screw) ×2 IMPLANT
SCREW LAGSTD 85X21X12.7X9 (Screw) ×1 IMPLANT
SET BASIN LINEN APH (SET/KITS/TRAYS/PACK) ×3 IMPLANT
SPONGE GAUZE 4X4 12PLY (GAUZE/BANDAGES/DRESSINGS) ×3 IMPLANT
SPONGE LAP 18X18 X RAY DECT (DISPOSABLE) ×6 IMPLANT
STAPLER VISISTAT 35W (STAPLE) ×3 IMPLANT
SUT BRALON NAB BRD #1 30IN (SUTURE) ×6 IMPLANT
SUT PLAIN 2 0 XLH (SUTURE) ×3 IMPLANT
SUT SILK 0 FSL (SUTURE) ×3 IMPLANT
SYR BULB IRRIGATION 50ML (SYRINGE) ×3 IMPLANT
TAPE MEDIFIX FOAM 3 (GAUZE/BANDAGES/DRESSINGS) ×3 IMPLANT
YANKAUER SUCT 12FT TUBE ARGYLE (SUCTIONS) ×3 IMPLANT
YANKAUER SUCT BULB TIP NO VENT (SUCTIONS) ×3 IMPLANT

## 2014-09-02 NOTE — Anesthesia Procedure Notes (Addendum)
Procedure Name: MAC Date/Time: 09/02/2014 12:08 PM Performed by: Pernell DupreADAMS, AMY A Pre-anesthesia Checklist: Patient identified, Timeout performed, Emergency Drugs available, Suction available and Patient being monitored Oxygen Delivery Method: Simple face mask   Spinal Patient location during procedure: OR Start time: 09/02/2014 12:25 PM Staffing Resident/CRNA: ADAMS, AMY A Performed by: resident/CRNA  Preanesthetic Checklist Completed: patient identified, site marked, surgical consent, pre-op evaluation, timeout performed, IV checked, risks and benefits discussed and monitors and equipment checked Spinal Block Patient position: left lateral decubitus Prep: Betadine Patient monitoring: heart rate, cardiac monitor, continuous pulse ox and blood pressure Approach: left paramedian Location: L3-4 Injection technique: single-shot Needle Needle type: Spinocan  Needle gauge: 22 G Needle length: 9 cm Assessment Sensory level: T8 Additional Notes  ATTEMPTS:1 TRAY ZH:[id number] TRAY EXPIRATION DATE:12/2014

## 2014-09-02 NOTE — Plan of Care (Addendum)
Dr notified about low BP, concerns of given Morphine esponseand possible need for PO Pain meds. Waiting on response. Info passed in report to 3rd shift RN. [phone].

## 2014-09-02 NOTE — Clinical Social Work Placement (Signed)
   CLINICAL SOCIAL WORK PLACEMENT  NOTE  Date:  09/02/2014  Patient Details  Name: Velna Hatchetancy Melling MRN: [id number] Date of Birth: 01/28/1926  Clinical Social Work is seeking post-discharge placement for this patient at the Skilled  Nursing Facility level of care (*CSW will initial, date and re-position this form in  chart as items are completed):  Yes   Patient/family provided with [facility] Social Work Department's list of facilities offering this level of care within the geographic area requested by the patient (or if unable, by the patient's family).  Yes   Patient/family informed of their freedom to choose among providers that offer the needed level of care, that participate in Medicare, Medicaid or managed care program needed by the patient, have an available bed and are willing to accept the patient.  Yes   Patient/family informed of [facility]'s ownership interest in [facility] and [facility], as well as of the fact that they are under no obligation to receive care at these facilities.  PASRR submitted to EDS on 09/02/14     PASRR number received on 09/02/14     Existing PASRR number confirmed on       FL2 transmitted to all facilities in geographic area requested by pt/family on 09/02/14     FL2 transmitted to all facilities within larger geographic area on       Patient informed that his/her managed care company has contracts with or will negotiate with certain facilities, including the following:            Patient/family informed of bed offers received.  Patient chooses bed at       Physician recommends and patient chooses bed at      Patient to be transferred to   on  .  Patient to be transferred to facility by       Patient family notified on   of transfer.  Name of family member notified:        PHYSICIAN       Additional Comment:    _______________________________________________ Karn CassisStultz, Sheilla Maris [place], LCSW 09/02/2014, 9:18  AM [phone]

## 2014-09-02 NOTE — Progress Notes (Signed)
Mid- level made aware of pts BP. Per mid level ordered to give IV Morphine because systolic BP was within normal limits.

## 2014-09-02 NOTE — Progress Notes (Signed)
[facility] PROGRESS NOTE  Andrea [name] ZOX:[id number] DOB: 01/19/1926 DOA: 09/01/2014 PCP: Pcp Not In System  Assessment/Plan: Left intertrochanteric hip fracture -Status post repair by Dr. Hilda LiasKeeling. -Will likely require SNF upon discharge.  Hypothyroidism -Continue Synthroid  Hypertension -Stable.  Code Status: Full code Family Communication: Patient only  Disposition Plan: SNF   Consultants:  Orthopedics, Dr. Hilda LiasKeeling   Antibiotics:  None   Subjective: Patient seen briefly this a.m. as she was leaving for surgery.  Objective: Filed Vitals:   09/02/14 1515 09/02/14 1530 09/02/14 1542 09/02/14 1550  BP: 101/54 101/40 104/47 106/62  Pulse: 66 69 68 68  Temp:  97.6 F (36.4 C)  97.5 F (36.4 C)  TempSrc:      Resp: 16 16 17 18   Height:      Weight:      SpO2: 95% 96% 93% 93%    Intake/Output Summary (Last 24 hours) at 09/02/14 1648 Last data filed at 09/02/14 1409  Gross per 24 hour  Intake   1630 ml  Output    150 ml  Net   1480 ml   Filed Weights   09/01/14 1301 09/01/14 1840 09/01/14 2120  Weight: 81.647 kg (180 lb) 81.647 kg (180 lb) 80.6 kg (177 lb 11.1 oz)    Exam:   General:  Alert, awake, oriented 3  Cardiovascular: Regular rate and rhythm  Respiratory: Clear to auscultation bilaterally  Abdomen: Soft, nontender, nondistended  Extremities: No clubbing, cyanosis or edema   Neurologic:  Nonfocal  Data Reviewed: Basic Metabolic Panel:  Recent Labs Lab 09/01/14 1616 09/02/14 0647  NA 140 141  K 3.9 4.1  CL 104 107  CO2 25 26  GLUCOSE 118* 115*  BUN 21* 22*  CREATININE 1.11* 1.14*  CALCIUM 9.4 8.8*   Liver Function Tests:  Recent Labs Lab 09/02/14 0647  AST 37  ALT 25  ALKPHOS 71  BILITOT 1.1  PROT 6.0*  ALBUMIN 3.3*   No results for input(s): LIPASE, AMYLASE in the last 168 hours. No results for input(s): AMMONIA in the last 168 hours. CBC:  Recent Labs Lab 09/01/14 1616 09/02/14 0647  WBC 13.6*  6.6  NEUTROABS 12.0* 4.9  HGB 13.9 13.1  HCT 41.2 39.8  MCV 91.8 92.8  PLT 294 259   Cardiac Enzymes: No results for input(s): CKTOTAL, CKMB, CKMBINDEX, TROPONINI in the last 168 hours. BNP (last 3 results) No results for input(s): BNP in the last 8760 hours.  ProBNP (last 3 results) No results for input(s): PROBNP in the last 8760 hours.  CBG: No results for input(s): GLUCAP in the last 168 hours.  Recent Results (from the past 240 hour(s))  Surgical pcr screen     Status: None   Collection Time: 09/02/14 12:30 PM  Result Value Ref Range Status   MRSA, PCR NEGATIVE NEGATIVE Final   Staphylococcus aureus NEGATIVE NEGATIVE Final    Comment:        The Xpert SA Assay (FDA approved for NASAL specimens in patients over 79 years of age), is one component of a comprehensive surveillance program.  Test performance has been validated by [facility] for patients greater than or equal to 79 year old. It is not intended to diagnose infection nor to guide or monitor treatment.      Studies: Dg Chest 1 View  09/01/2014   CLINICAL DATA:  Left hip fracture.  Fall today.  EXAM: CHEST  1 VIEW  COMPARISON:  None.  FINDINGS: Bony demineralization.  Degenerative spurring at both [facility] joints.  Atherosclerotic calcification and tortuosity of the thoracic aorta. Apical lordotic projection. Upper normal heart size. Faint interstitial accentuation peripherally in both lungs. Thoracic spondylosis.  IMPRESSION: 1. Peripheral interstitial accentuation in both lungs could reflect mild fibrosis but is technically nonspecific. 2. Atherosclerotic aortic arch. 3. Degenerative AC joint arthropathy bilaterally.   Electronically Signed   By: Gaylyn Rong M.D.   On: 09/01/2014 15:28   Dg Tibia/fibula Left  09/01/2014   CLINICAL DATA:  Left hip pain radiating down the leg status post fall in the garden striking the left hip  EXAM: LEFT TIBIA AND FIBULA - 2 VIEW  COMPARISON:  None.  FINDINGS: The left tibia  and fibula are adequately mineralized. There is no acute fracture. There is moderate degenerative change of all 3 joint compartments of the left knee. The left ankle is only partially included in the field of view. The soft tissues of the leg are normal.  IMPRESSION: There is moderate tricompartmental degenerative change of the left knee. There is no acute bony abnormality of the left tibia or fibula. The tip of the medial malleolus is excluded from the field of view but is included on an accompanying ankle series   Electronically Signed   By: David  Swaziland M.D.   On: 09/01/2014 15:28   Dg Ankle Complete Left  09/01/2014   CLINICAL DATA:  Left hip pain which radiates down left leg  EXAM: LEFT ANKLE COMPLETE - 3+ VIEW  COMPARISON:  None.  FINDINGS: There is no evidence of fracture, dislocation, or joint effusion. There is no evidence of arthropathy or other focal bone abnormality. Soft tissues are unremarkable.  IMPRESSION: Negative.   Electronically Signed   By: Signa Kell M.D.   On: 09/01/2014 15:29   Ct Head Wo Contrast  09/01/2014   CLINICAL DATA:  Pain following fall  EXAM: CT HEAD WITHOUT CONTRAST  TECHNIQUE: Contiguous axial images were obtained from the base of the skull through the vertex without intravenous contrast.  COMPARISON:  None.  FINDINGS: There is mild diffuse atrophy. There is no intracranial mass, hemorrhage, extra-axial fluid collection, or midline shift. There is patchy small vessel disease throughout the centra semiovale bilaterally. No acute infarct is apparent. The bony calvarium appears intact. The mastoid air cells are clear. There is mild mucosal thickening in each maxillary antra, more on the right than on the left.  IMPRESSION: Atrophy with patchy small vessel disease in the centra semiovale bilaterally. No intracranial mass, hemorrhage, or extra-axial fluid collection. No acute appearing infarct. Mild paranasal sinus disease in the maxillary antra.   Electronically Signed   By:  Bretta Bang III M.D.   On: 09/01/2014 14:26   Dg Hip Unilat With Pelvis 1v Left  09/01/2014   CLINICAL DATA:  Left hip pain radiating down the left leg.  Fall.  EXAM: LEFT HIP (WITH PELVIS) 1 VIEW  COMPARISON:  None.  FINDINGS: Acute intertrochanteric fracture of the left proximal femur. The medial portion of this fractures strays close to the base of the femoral neck.  Lower lumbar spondylosis and degenerative disc disease.  Bony demineralization.  IMPRESSION: 1. Acute left intertrochanteric hip fracture. This strays close to the femoral neck margin medially. 2. Bony demineralization and the hip fracture indicate osteoporosis. 3. Lower lumbar spondylosis and degenerative disc disease.   Electronically Signed   By: Gaylyn Rong M.D.   On: 09/01/2014 15:27   Dg Hip Operative Unilat With Pelvis Left  09/02/2014  CLINICAL DATA:  Left hip fracture.  ORIF.  EXAM: OPERATIVE LEFT HIP (WITH PELVIS IF PERFORMED)  VIEWS  TECHNIQUE: Fluoroscopic spot image(s) were submitted for interpretation post-operatively.  FLUOROSCOPY TIME:  Radiation Exposure Index (as provided by the fluoroscopic device):  If the device does not provide the exposure index:  Fluoroscopy Time:  1 minutes 20 seconds  Number of Acquired Images:  0  COMPARISON:  09/01/2014  FINDINGS: Three intraoperative spot images demonstrate dynamic hip screw and lateral plate fixation across the left femoral intertrochanteric fracture. Near anatomic alignment. No hardware or bony complicating feature.  IMPRESSION: Internal fixation across the left intertrochanteric fracture. No complicating feature.   Electronically Signed   By: Charlett Nose M.D.   On: 09/02/2014 14:17   Dg Femur Min 2 Views Left  09/01/2014   CLINICAL DATA:  Left hip pain after falling today. Initial encounter.  EXAM: LEFT FEMUR 2 VIEWS  COMPARISON:  None.  FINDINGS: The bones are demineralized. There is a mildly displaced intertrochanteric left femur fracture. The femoral head is  intact without dislocation. There is no evidence of pelvic fracture. Degenerative changes are noted within the lower lumbar spine and at the left knee.  IMPRESSION: Mildly displaced intertrochanteric left femur fracture.   Electronically Signed   By: Carey Bullocks M.D.   On: 09/01/2014 15:26    Scheduled Meds: . sodium chloride   Intravenous Once  . albuterol  2.5 mg Nebulization Q6H  . doxazosin  4 mg Oral Daily  . [START ON 09/03/2014] enoxaparin (LOVENOX) injection  40 mg Subcutaneous Q24H  . levothyroxine  125 mcg Oral QAC breakfast  . triamterene-hydrochlorothiazide  0.5 tablet Oral Daily   Continuous Infusions: . sodium chloride 75 mL/hr at 09/02/14 0919    Active Problems:   Intertrochanteric fracture of left hip   Hypothyroidism   Hypertension   Closed left hip fracture    Time spent: 25 minutes. Greater than 50% of this time was spent in direct contact with the patient coordinating care.    Chaya Jan  [facility] Pager [phone]  If 7PM-7AM, please contact night-coverage at [email or web address], password [facility] 09/02/2014, 4:48 PM  LOS: 1 day

## 2014-09-02 NOTE — Clinical Social Work Note (Signed)
Clinical Social Work Assessment  Patient Details  Name: Andrea [name] MRN: [id number] Date of Birth: 1926-02-15  Date of referral:  09/02/14               Reason for consult:  Facility Placement                Permission sought to share information with:    Permission granted to share information::     Name::        Agency::     Relationship::     Contact Information:     Housing/Transportation Living arrangements for the past 2 months:  [place] of Information:  Patient Patient Interpreter Needed:  None Criminal Activity/Legal Involvement Pertinent to Current Situation/Hospitalization:  No - Comment as needed Significant Relationships:  Adult Children Lives with:  Adult Children Do you feel safe going back to the place where you live?   (Pt agrees she will likely need SNF at d/c before returning home. ) Need for family participation in patient care:  Yes (Comment) (Pt will require some assist, but has been independent.)  Care giving concerns:  Pt's daughter lives with her, but is on disability per pt.   Social Worker assessment / plan:  CSW met with pt at bedside. Pt alert and oriented and very pleasant. She states she was tilling in the garden yesterday and lost her balance. Pt fell and fractured her left hip. She is scheduled for surgery later today. Pt reports her daughter, [name] lives with her. Pt is independent at baseline and still drives. She describes herself as an "outside person" and also enjoys spending time with her friends. CSW discussed with pt that she will likely require SNF after surgery. She states she has been told this and understands. Pt is agreeable to [place] or [place] placement and plans to discuss this with her family this weekend further. SNF list provided. Pt aware of Medicare coverage/criteria.   Employment status:  Retired Forensic scientist:  Medicare PT Recommendations:  Not assessed at this time [place] / Referral  to community resources:  [place]  Patient/Family's Response to care:  Pt agreeable to begin SNF bed search.   Patient/Family's Understanding of and Emotional Response to Diagnosis, Current Treatment, and Prognosis:  Pt reports that plan has been discussed with her and she is understanding. She reports she has always been healthy and this will be a new experience for her. She states, "I think I'm going to pull through fine."   Emotional Assessment Appearance:  Appears younger than stated age Attitude/Demeanor/Rapport:  Other (Pt very pleasant) Affect (typically observed):  Hopeful Orientation:  Oriented to Self, Oriented to Place, Oriented to  Time, Oriented to Situation Alcohol / Substance use:  Not Applicable Psych involvement (Current and /or in the community):  No (Comment)  Discharge Needs  Concerns to be addressed:  Discharge Planning Concerns Readmission within the last 30 days:  No Current discharge risk:  Other (hip fracture) Barriers to Discharge:  Continued Medical Work up   [employer], [employer], [place] 09/02/2014, 9:20 AM [phone]

## 2014-09-02 NOTE — Care Management Note (Signed)
Case Management Note  Patient Details  Name: Andrea [name] MRN: [id number] Date of Birth: 09/14/1925  Expected Discharge Date:                  Expected Discharge Plan:  Home/Self Care  In-House Referral:  Clinical Social Work  Discharge planning Services  CM Consult  Post Acute Care Choice:  NA Choice offered to:  NA  DME Arranged:    DME Agency:     HH Arranged:    HH Agency:     Status of Service:  Completed, signed off  Medicare Important Message Given:  Yes Date Medicare IM Given:  09/02/14 Medicare IM give by:  Kathyrn SheriffJessica Jacquelyne Quarry, RN, MSN, CM  Date Additional Medicare IM Given:    Additional Medicare Important Message give by:     If discussed at Long Length of Stay Meetings, dates discussed:    Additional Comments: Pt is from home and independent at baseline. Pt admitted after falling and fx hip at home. Pt now in surgery. Anticipate need for SNF at discharge. CSW is aware and will arrange for placement. No CM needs anticipated.  [name], [name], RN 09/02/2014, 3:52 PM

## 2014-09-02 NOTE — Anesthesia Postprocedure Evaluation (Signed)
  Anesthesia Post-op Note  Patient: Andrea [name]  Procedure(s) Performed: Procedure(s): OPEN REDUCTION INTERNAL FIXATION LEFT HIP FRACTURE (Left)  Patient Location: PACU  Anesthesia Type:Spinal  Level of Consciousness: awake, alert , oriented and patient cooperative  Airway and Oxygen Therapy: Patient Spontanous Breathing and Patient connected to nasal cannula oxygen  Post-op Pain: none  Post-op Assessment: Post-op Vital signs reviewed, Patient's Cardiovascular Status Stable, Respiratory Function Stable, Patent Airway, No signs of Nausea or vomiting and Pain level controlled  Post-op Vital Signs: Reviewed and stable  Last Vitals:  Filed Vitals:   09/02/14 1205  BP: 101/49  Pulse:   Temp:   Resp: 23    Complications: No apparent anesthesia complications

## 2014-09-02 NOTE — Brief Op Note (Signed)
09/01/2014 - 09/02/2014  1:57 PM  PATIENT:  Andrea [name]  81 y.o. female  PRE-OPERATIVE DIAGNOSIS:  Intertrochanteric Fracture Left Hip  POST-OPERATIVE DIAGNOSIS:  Intertrochanteric Fracture Left Hip  PROCEDURE:  Procedure(s): OPEN REDUCTION INTERNAL FIXATION LEFT HIP FRACTURE (Left)  SURGEON:  Surgeon(s) and Role:    * Darreld McleanWayne Elzina Devera, MD - Primary  PHYSICIAN ASSISTANT:   ASSISTANTS: [place] NationBetty Ashley, RN    ANESTHESIA:   general  EBL:  Total I/O In: 1100 [I.V.:1100] Out: 150 [Blood:150]  BLOOD ADMINISTERED:none  DRAINS: Large Hemovac in the left hip area  LOCAL MEDICATIONS USED:  NONE  SPECIMEN:  No Specimen  DISPOSITION OF SPECIMEN:  N/A  COUNTS:  YES  TOURNIQUET:  * No tourniquets in log *  DICTATION: .Other Dictation: Dictation Number [id number]  PLAN OF CARE: Admit to inpatient   PATIENT DISPOSITION:  PACU - hemodynamically stable.   Delay start of Pharmacological VTE agent (>24hrs) due to surgical blood loss or risk of bleeding: no

## 2014-09-02 NOTE — Progress Notes (Signed)
The History and Physical is unchanged. I have examined the patient. The patient is medically able to have surgery on the left hip . Andrea [name] 

## 2014-09-02 NOTE — Transfer of Care (Signed)
Immediate Anesthesia Transfer of Care Note  Patient: Andrea [name]  Procedure(s) Performed: Procedure(s): OPEN REDUCTION INTERNAL FIXATION LEFT HIP FRACTURE (Left)  Patient Location: PACU  Anesthesia Type:Spinal  Level of Consciousness: awake, alert , oriented and patient cooperative  Airway & Oxygen Therapy: Patient Spontanous Breathing and Patient connected to nasal cannula oxygen  Post-op Assessment: Report given to RN and Post -op Vital signs reviewed and stable  Post vital signs: Reviewed and stable  Last Vitals:  Filed Vitals:   09/02/14 1205  BP: 101/49  Pulse:   Temp:   Resp: 23    Complications: No apparent anesthesia complications

## 2014-09-02 NOTE — Anesthesia Preprocedure Evaluation (Addendum)
Anesthesia Evaluation  Patient identified by MRN, date of birth, ID band Patient awake    Reviewed: Allergy & Precautions, NPO status , Patient's Chart, lab work & pertinent test results  Airway Mallampati: I  TM Distance: >3 FB     Dental  (+) Edentulous Upper, Edentulous Lower   Pulmonary neg pulmonary ROS,  breath sounds clear to auscultation        Cardiovascular hypertension, Pt. on medications Rhythm:Regular Rate:Normal     Neuro/Psych    GI/Hepatic negative GI ROS,   Endo/Other  Hypothyroidism   Renal/GU      Musculoskeletal   Abdominal   Peds  Hematology   Anesthesia Other Findings   Reproductive/Obstetrics                             Anesthesia Physical Anesthesia Plan  ASA: II  Anesthesia Plan: Spinal   Post-op Pain Management:    Induction:   Airway Management Planned: Simple Face Mask  Additional Equipment:   Intra-op Plan:   Post-operative Plan:   Informed Consent: I have reviewed the patients History and Physical, chart, labs and discussed the procedure including the risks, benefits and alternatives for the proposed anesthesia with the patient or authorized representative who has indicated his/her understanding and acceptance.     Plan Discussed with:   Anesthesia Plan Comments:         Anesthesia Quick Evaluation

## 2014-09-03 LAB — CBC
HCT: 34.7 % — ABNORMAL LOW (ref 36.0–46.0)
HEMOGLOBIN: 11.5 g/dL — AB (ref 12.0–15.0)
MCH: 31 pg (ref 26.0–34.0)
MCHC: 33.1 g/dL (ref 30.0–36.0)
MCV: 93.5 fL (ref 78.0–100.0)
Platelets: 213 10*3/uL (ref 150–400)
RBC: 3.71 MIL/uL — ABNORMAL LOW (ref 3.87–5.11)
RDW: 13.9 % (ref 11.5–15.5)
WBC: 7.7 10*3/uL (ref 4.0–10.5)

## 2014-09-03 LAB — BASIC METABOLIC PANEL
ANION GAP: 7 (ref 5–15)
BUN: 18 mg/dL (ref 6–20)
CALCIUM: 8.3 mg/dL — AB (ref 8.9–10.3)
CO2: 24 mmol/L (ref 22–32)
Chloride: 106 mmol/L (ref 101–111)
Creatinine, Ser: 1.04 mg/dL — ABNORMAL HIGH (ref 0.44–1.00)
GFR calc non Af Amer: 47 mL/min — ABNORMAL LOW (ref >60)
GFR, EST AFRICAN AMERICAN: 55 mL/min — AB (ref >60)
GLUCOSE: 122 mg/dL — AB (ref 65–99)
Potassium: 4 mmol/L (ref 3.5–5.1)
SODIUM: 137 mmol/L (ref 135–145)

## 2014-09-03 NOTE — Progress Notes (Signed)
Subjective: 1 Day Post-Op Procedure(s) (LRB): OPEN REDUCTION INTERNAL FIXATION LEFT HIP FRACTURE (Left) Patient reports pain as 4 on 0-10 scale.    Objective: Vital signs in last 24 hours: Temp:  [97.5 F (36.4 C)-99 F (37.2 C)] 99 F (37.2 C) (06/04 0602) Pulse Rate:  [62-101] 88 (06/04 0602) Resp:  [15-23] 18 (06/04 0602) BP: (88-135)/(30-75) 123/57 mmHg (06/04 0602) SpO2:  [91 %-100 %] 91 % (06/04 0737)  Intake/Output from previous day: 06/03 0701 - 06/04 0700 In: 1390 [P.O.:240; I.V.:1150] Out: 670 [Urine:350; Drains:170; Blood:150] Intake/Output this shift:     Recent Labs  09/01/14 1616 09/02/14 0647 09/03/14 0614  HGB 13.9 13.1 11.5*    Recent Labs  09/02/14 0647 09/03/14 0614  WBC 6.6 7.7  RBC 4.29 3.71*  HCT 39.8 34.7*  PLT 259 213    Recent Labs  09/02/14 0647 09/03/14 0614  NA 141 137  K 4.1 4.0  CL 107 106  CO2 26 24  BUN 22* 18  CREATININE 1.14* 1.04*  GLUCOSE 115* 122*  CALCIUM 8.8* 8.3*   No results for input(s): LABPT, INR in the last 72 hours.  Neurologically intact Neurovascular intact Sensation intact distally Intact pulses distally Dorsiflexion/Plantar flexion intact   She is up in the chair.  Her pain is controlled.  She had a fairly good night last night.  Assessment/Plan: 1 Day Post-Op Procedure(s) (LRB): OPEN REDUCTION INTERNAL FIXATION LEFT HIP FRACTURE (Left) Up with therapy  Allen Egerton 09/03/2014, 10:16 AM

## 2014-09-03 NOTE — Op Note (Signed)
[name], Andrea [name]               ACCOUNT NO.:  [id number]  MEDICAL RECORD NO.:  [id number]  LOCATION:  A313                          FACILITY:  [facility]  PHYSICIAN:  J. Darreld Mclean, M.D. DATE OF BIRTH:  [birth year]  DATE OF PROCEDURE: DATE OF DISCHARGE:                              OPERATIVE REPORT   PREOPERATIVE DIAGNOSIS:  Intertrochanteric fracture of the left hip.  POSTOPERATIVE DIAGNOSIS:  Intertrochanteric fracture of the left hip.  PROCEDURE:  Open treatment and internal fixation of left hip intertrochanteric fracture using Richards Classic compression screw system with 85-mm compression screw, 135-degree short barrel, 4-hole side plate.  INDICATIONS:  The patient fell yesterday, working in her garden and sustained the above-mentioned injury.  She has been seen and evaluated by the Hospitalist, found to be an acceptable candidate at an increased risk because of her age.  I have gone over the risks and imponderables of the procedure and the patient appears to understand and accepts this.  DESCRIPTION OF PROCEDURE:  The patient was seen in the holding area and the left hip was identified as correct surgical site.  A mark was placed on the left hip.  The patient was brought to the operating room, given general anesthesia and transferred to the fracture table.  She was then positioned on the fracture table.  The C-arm fluoroscopy unit was brought in.  Everyone had on aprons, shields and the badges.  Scout film was taken.  Hip was reduced as best we could, AP and lateral views.  The patient was then prepped and draped in usual manner.  Another time-out identifying the patient as Andrea [name], where we were doing a left hip intertrochanteric fracture on the left.  Everyone had on the shields, aprons and badges, everyone knew each other.  Incision was made through the skin, subcutaneous tissue, tensor fascia lata, and vastus lateralis.  Femoral shaft was identified.  Guidepin  was placed.  I tried to reduce it little better to get a better positioning, but looked very good on the AP, but on the lateral, it was slightly offset somewhat, but not as anatomic as I would like.  Guidepin was then placed to 135-degree angle and looked good on both AP and lateral views. It measured 90 mm, and 85-mm compression screw was selected.  An 85-mm step drill was used.  An 85-mm compression screw was then inserted without difficulty with visualization of both in AP and lateral views. A 135-degree side plate was placed and then drill holes were made, so to be under compression.  These screws measured from 42 mm to 38 mm and then two 36 as most distally.  Permanent pictures were taken.  Hemovac drain was placed, sewn in with 2-0 silk suture.  The vastus lateralis was reapproximated using running #1 Bralon suture.  Tensor fascia lata was reapproximated using interrupted figure-of-eight #1 Bralon suture.  Subcutaneous tissue was closed in layers using 2-0 plain and skin reapproximated with skin staples.  Sterile dressing applied.  The patient tolerated the procedure well, will go to recovery in good condition.  She was admitted of course.          ______________________________ J. Darreld Mclean,  M.D.     Andrea DecreeJWK/MEDQ  D:  09/02/2014  T:  09/03/2014  Job:  [id number]

## 2014-09-03 NOTE — Evaluation (Signed)
Physical Therapy Evaluation Patient Details Name: Andrea [name] MRN: [id number] DOB: January 02, 1926 Today's Date: 09/03/2014   History of Present Illness  This is a very pleasant 81 year old lady who was out in her garden telling the swell. She tripped over a large clod of dirt and fell on to her left hip. She was unable to stand. She denied any chest pain, palpitations, dyspnea or syncopal event. There was no head injury. When she presented to the emergency room, x-rays showed presence of a left hip fracture. There was slight displacement. She is now being admitted for further management. Orthopedics, Dr. Hilda Lias, has already seen the patient and will take her to the operating room tomorrow.  Pt underwent OTIF on 09-02-14 and is now TTWB left.  She has a history of severe OA of the knees and moderate in the hips.  Clinical Impression   Pt was seen for initial visit following OTIF.  She was alert and oriented, very cooperative.  Due to bedrest following her fall, she has severe limitation of knee flexion bilaterally.  She states that her knees get "stiff" whenever her knees have been at rest for any period of time.  By the end of PT visit, her right knee could only flex to 60 degrees.  This presents problems with transfers from a chair.  She was able to tolerate gentle supine exercise per protocol.  She required max assist to transfer supine to sit to stand with a walker.  She was able to transfer to a chair with the walker.  She is agreeable to SNF at d/c.    Follow Up Recommendations SNF    Equipment Recommendations  Rolling walker with 5" wheels    Recommendations for Other Services   none    Precautions / Restrictions Precautions Precautions: None Restrictions Weight Bearing Restrictions: Yes LLE Weight Bearing: Touchdown weight bearing Other Position/Activity Restrictions: This is not recorded in the chart but is a part of Dr. Jenetta Downer standard post op protocol      Mobility  Bed  Mobility Overal bed mobility: Needs Assistance Bed Mobility: Supine to Sit     Supine to sit: Max assist;HOB elevated        Transfers Overall transfer level: Needs assistance Equipment used: Rolling walker (2 wheeled) Transfers: Sit to/from Stand Sit to Stand: Max assist;From elevated surface         General transfer comment: pt is unable to stand from a standard sitting height...she was able to take several small steps to pivot to the chair  Ambulation/Gait   unable                                                                             Pertinent Vitals/Pain Pain Assessment:  (no pain at rest...she was premedicated for PT)    Home Living Family/patient expects to be discharged to:: Skilled nursing facility                      Prior Function Level of Independence: Independent                       Extremity/Trunk Assessment  Lower Extremity Assessment: RLE deficits/detail;LLE deficits/detail RLE Deficits / Details: very limited knee flexion due to prolonged bedrest and severe OA...initially only able to achieve 45 degrees knee flexion but by the end of tx had 60 degrees of flexion...Marland Kitchen.Marland Kitchen.Marland Kitchen.hip flexion to 90 degrees LLE Deficits / Details: only 30 degrees of hip and knee flexion achieved with standard bed exercise...when seated, pt had 45 degrees of knee flexion     Communication   Communication: No difficulties  Cognition Arousal/Alertness: Awake/alert Behavior During Therapy: WFL for tasks assessed/performed Overall Cognitive Status: Within Functional Limits for tasks assessed                      General Comments      Exercises General Exercises - Lower Extremity Ankle Circles/Pumps: AAROM;Both;10 reps;Supine Quad Sets: AROM;Both;10 reps;Supine Gluteal Sets: Supine;AROM;Both;10 reps Short Arc Quad: AAROM;Both;10 reps;Supine Heel Slides: AROM;Both;10 reps;Supine Hip  ABduction/ADduction: AAROM;Both;10 reps;Supine      Assessment/Plan    PT Assessment Patient needs continued PT services  PT Diagnosis Difficulty walking;Acute pain (decrease LE joint ROM)   PT Problem List Decreased strength;Decreased range of motion;Decreased activity tolerance;Decreased mobility;Decreased knowledge of use of DME;Decreased knowledge of precautions;Pain  PT Treatment Interventions DME instruction;Gait training;Functional mobility training;Therapeutic exercise   PT Goals (Current goals can be found in the Care Plan section) Acute Rehab PT Goals Patient Stated Goal: return to independence at home PT Goal Formulation: With patient Time For Goal Achievement: 09/17/14 Potential to Achieve Goals: Good    Frequency 7X/week   Barriers to discharge   none                   End of Session Equipment Utilized During Treatment: Gait belt Activity Tolerance: Patient tolerated treatment well Patient left: in chair;with call bell/phone within reach Nurse Communication: Mobility status         Time: 0810-0904 PT Time Calculation (min) (ACUTE ONLY): 54 min   Charges:   PT Evaluation $Initial PT Evaluation Tier I: 1 Procedure PT Treatments $Therapeutic Exercise: 8-22 mins   PT G CodesKonrad Penta:        Zoa Dowty L  PT 09/03/2014, 9:48 AM [phone]

## 2014-09-03 NOTE — Anesthesia Postprocedure Evaluation (Signed)
  Anesthesia Post-op Note  Patient: Andrea [name]  Procedure(s) Performed: Procedure(s): OPEN REDUCTION INTERNAL FIXATION LEFT HIP FRACTURE (Left)  Patient Location: room 313  Anesthesia Type:Spinal  Level of Consciousness: awake, alert , oriented and patient cooperative  Airway and Oxygen Therapy: Patient Spontanous Breathing  Post-op Pain: 2 /10, mild  Post-op Assessment: Post-op Vital signs reviewed, Patient's Cardiovascular Status Stable, Respiratory Function Stable, Patent Airway, No signs of Nausea or vomiting, Adequate PO intake and Pain level controlled  Post-op Vital Signs: Reviewed and stable  Last Vitals:  Filed Vitals:   09/03/14 1453  BP: 114/43  Pulse: 87  Temp: 36.9 C  Resp: 18    Complications: No apparent anesthesia complications

## 2014-09-03 NOTE — Progress Notes (Signed)
[facility] PROGRESS NOTE  Andrea [name] ZOX:[id number] DOB: 11/05/1925 DOA: 09/01/2014 PCP: Pcp Not In System  Assessment/Plan: Left hip intertrochanteric fracture -Status post repair by Dr. Hilda Lias on 6/3. -To SNF upon discharge.  Hypothyroidism -Continue Synthroid  Hypertension -Stable.  Code Status: Full code Family Communication: Patient only  Disposition Plan: To SNF when bed available   Consultants:  Orthopedics, Dr. Hilda Lias   Antibiotics:  None   Subjective: No chest pain, shortness of breath,  Objective: Filed Vitals:   09/03/14 0602 09/03/14 0737 09/03/14 1425 09/03/14 1453  BP: 123/57   114/43  Pulse: 88   87  Temp: 99 F (37.2 C)   98.5 F (36.9 C)  TempSrc: Oral     Resp: 18   18  Height:      Weight:      SpO2: 94% 91% 91% 92%    Intake/Output Summary (Last 24 hours) at 09/03/14 1553 Last data filed at 09/03/14 1200  Gross per 24 hour  Intake    720 ml  Output    520 ml  Net    200 ml   Filed Weights   09/01/14 1301 09/01/14 1840 09/01/14 2120  Weight: 81.647 kg (180 lb) 81.647 kg (180 lb) 80.6 kg (177 lb 11.1 oz)    Exam:   General:  Alert, awake, oriented 3, no distress  Cardiovascular: Regular rate and rhythm  Respiratory: Clear to auscultation bilaterally  Abdomen: Soft, nontender, nondistended  Extremities: No clubbing, cyanosis or edema   Neurologic:  Nonfocal  Data Reviewed: Basic Metabolic Panel:  Recent Labs Lab 09/01/14 1616 09/02/14 0647 09/03/14 0614  NA 140 141 137  K 3.9 4.1 4.0  CL 104 107 106  CO2 GLUCOSE 118* 115* 122*  BUN 21* 22* 18  CREATININE 1.11* 1.14* 1.04*  CALCIUM 9.4 8.8* 8.3*   Liver Function Tests:  Recent Labs Lab 09/02/14 0647  AST 37  ALT 25  ALKPHOS 71  BILITOT 1.1  PROT 6.0*  ALBUMIN 3.3*   No results for input(s): LIPASE, AMYLASE in the last 168 hours. No results for input(s): AMMONIA in the last 168 hours. CBC:  Recent Labs Lab  09/01/14 1616 09/02/14 0647 09/03/14 0614  WBC 13.6* 6.6 7.7  NEUTROABS 12.0* 4.9  --   HGB 13.9 13.1 11.5*  HCT 41.2 39.8 34.7*  MCV 91.8 92.8 93.5  PLT 294 259 213   Cardiac Enzymes: No results for input(s): CKTOTAL, CKMB, CKMBINDEX, TROPONINI in the last 168 hours. BNP (last 3 results) No results for input(s): BNP in the last 8760 hours.  ProBNP (last 3 results) No results for input(s): PROBNP in the last 8760 hours.  CBG: No results for input(s): GLUCAP in the last 168 hours.  Recent Results (from the past 240 hour(s))  Surgical pcr screen     Status: None   Collection Time: 09/02/14 12:30 PM  Result Value Ref Range Status   MRSA, PCR NEGATIVE NEGATIVE Final   Staphylococcus aureus NEGATIVE NEGATIVE Final    Comment:        The Xpert SA Assay (FDA approved for NASAL specimens in patients over 79 years of age), is one component of a comprehensive surveillance program.  Test performance has been validated by [facility] for patients greater than or equal to 79 year old. It is not intended to diagnose infection nor to guide or monitor treatment.      Studies: Dg Hip Operative Unilat With Pelvis Left  09/02/2014   CLINICAL DATA:  Left hip fracture.  ORIF.  EXAM: OPERATIVE LEFT HIP (WITH PELVIS IF PERFORMED)  VIEWS  TECHNIQUE: Fluoroscopic spot image(s) were submitted for interpretation post-operatively.  FLUOROSCOPY TIME:  Radiation Exposure Index (as provided by the fluoroscopic device):  If the device does not provide the exposure index:  Fluoroscopy Time:  1 minutes 20 seconds  Number of Acquired Images:  0  COMPARISON:  09/01/2014  FINDINGS: Three intraoperative spot images demonstrate dynamic hip screw and lateral plate fixation across the left femoral intertrochanteric fracture. Near anatomic alignment. No hardware or bony complicating feature.  IMPRESSION: Internal fixation across the left intertrochanteric fracture. No complicating feature.   Electronically Signed    By: Charlett NoseKevin  Dover M.D.   On: 09/02/2014 14:17    Scheduled Meds: . sodium chloride   Intravenous Once  . albuterol  2.5 mg Nebulization Q6H  . doxazosin  4 mg Oral Daily  . enoxaparin (LOVENOX) injection  40 mg Subcutaneous Q24H  . levothyroxine  125 mcg Oral QAC breakfast  . triamterene-hydrochlorothiazide  0.5 tablet Oral Daily   Continuous Infusions: . sodium chloride 75 mL/hr at 09/03/14 16100823    Active Problems:   Intertrochanteric fracture of left hip   Hypothyroidism   Hypertension   Closed left hip fracture    Time spent: 20 minutes. Greater than 50% of this time was spent in direct contact with the patient coordinating care.    Chaya JanHERNANDEZ ACOSTA,ESTELA  [facility] Pager [phone]  If 7PM-7AM, please contact night-coverage at [email or web address], password [email or web address] 09/03/2014, 3:53 PM  LOS: 2 days

## 2014-09-03 NOTE — Addendum Note (Signed)
Addendum  created 09/03/14 1615 by Despina Hiddenobert J Darlene Bartelt, CRNA   Modules edited: Notes Section   Notes Section:  File: [id number]

## 2014-09-04 DIAGNOSIS — S72142A Displaced intertrochanteric fracture of left femur, initial encounter for closed fracture: Principal | ICD-10-CM

## 2014-09-04 NOTE — Progress Notes (Signed)
Utilization review Completed Shaunta Oncale RN BSN   

## 2014-09-04 NOTE — Progress Notes (Signed)
Subjective: 2 Days Post-Op Procedure(s) (LRB): OPEN REDUCTION INTERNAL FIXATION LEFT HIP FRACTURE (Left) Patient reports pain as mild.    Objective: Vital signs in last 24 hours: Temp:  [98.5 F (36.9 C)-99.2 F (37.3 C)] 99.2 F (37.3 C) (06/05 0651) Pulse Rate:  [70-87] 70 (06/05 0651) Resp:  [18] 18 (06/05 0651) BP: (112-125)/(43-62) 112/62 mmHg (06/05 0651) SpO2:  [91 %-97 %] 93 % (06/05 0733)  Intake/Output from previous day: 06/04 0701 - 06/05 0700 In: 3110 [P.O.:720; I.V.:2390] Out: 970 [Urine:900; Drains:70] Intake/Output this shift:     Recent Labs  09/01/14 1616 09/02/14 0647 09/03/14 0614  HGB 13.9 13.1 11.5*    Recent Labs  09/02/14 0647 09/03/14 0614  WBC 6.6 7.7  RBC 4.29 3.71*  HCT 39.8 34.7*  PLT 259 213    Recent Labs  09/02/14 0647 09/03/14 0614  NA 141 137  K 4.1 4.0  CL 107 106  CO2 26 24  BUN 22* 18  CREATININE 1.14* 1.04*  GLUCOSE 115* 122*  CALCIUM 8.8* 8.3*   No results for input(s): LABPT, INR in the last 72 hours.  Neurologically intact Neurovascular intact Sensation intact distally Intact pulses distally Dorsiflexion/Plantar flexion intact Incision: scant drainage   Hemovac removed.  Wound OK.  Plan on SNF post discharge.  Assessment/Plan: 2 Days Post-Op Procedure(s) (LRB): OPEN REDUCTION INTERNAL FIXATION LEFT HIP FRACTURE (Left) Up with therapy  Andrea [name] 09/04/2014, 10:15 AM

## 2014-09-04 NOTE — Progress Notes (Signed)
[facility] PROGRESS NOTE  Andrea [name] BJY:[id number] DOB: 05/01/1925 DOA: 09/01/2014 PCP: Pcp Not In System  Assessment/Plan: Left hip intertrochanteric fracture -Status post repair by Dr. Hilda LiasKeeling on 6/3. -Only has mild pain today. -To SNF upon discharge. -Lovenox for DVT prophylaxis.  Hypothyroidism -Continue Synthroid  Hypertension -Stable  Code Status: Full code Family Communication: Patient only  Disposition Plan: To SNF once bed available   Consultants:  Orthopedics, Dr. Hilda LiasKeeling   Antibiotics:  None   Subjective: No complaints, only mild postop pain  Objective: Filed Vitals:   09/03/14 2012 09/03/14 2211 09/04/14 0651 09/04/14 0733  BP:  125/59 112/62   Pulse:   70   Temp:   99.2 F (37.3 C)   TempSrc:   Oral   Resp:   18   Height:      Weight:      SpO2: 97%  94% 93%    Intake/Output Summary (Last 24 hours) at 09/04/14 1044 Last data filed at 09/04/14 0552  Gross per 24 hour  Intake   2870 ml  Output    970 ml  Net   1900 ml   Filed Weights   09/01/14 1301 09/01/14 1840 09/01/14 2120  Weight: 81.647 kg (180 lb) 81.647 kg (180 lb) 80.6 kg (177 lb 11.1 oz)    Exam:   General:  Alert, awake, oriented 3  Cardiovascular: Regular rate and rhythm  Respiratory: Clear to auscultation bilaterally  Abdomen: Soft, nontender, nondistended, positive bowel sounds  Extremities: No edema, positive pulses   Neurologic:  nonfocal  Data Reviewed: Basic Metabolic Panel:  Recent Labs Lab 09/01/14 1616 09/02/14 0647 09/03/14 0614  NA 140 141 137  K 3.9 4.1 4.0  CL 104 107 106  CO2 25 26 24   GLUCOSE 118* 115* 122*  BUN 21* 22* 18  CREATININE 1.11* 1.14* 1.04*  CALCIUM 9.4 8.8* 8.3*   Liver Function Tests:  Recent Labs Lab 09/02/14 0647  AST 37  ALT 25  ALKPHOS 71  BILITOT 1.1  PROT 6.0*  ALBUMIN 3.3*   No results for input(s): LIPASE, AMYLASE in the last 168 hours. No results for input(s): AMMONIA in the last 168  hours. CBC:  Recent Labs Lab 09/01/14 1616 09/02/14 0647 09/03/14 0614  WBC 13.6* 6.6 7.7  NEUTROABS 12.0* 4.9  --   HGB 13.9 13.1 11.5*  HCT 41.2 39.8 34.7*  MCV 91.8 92.8 93.5  PLT 294 259 213   Cardiac Enzymes: No results for input(s): CKTOTAL, CKMB, CKMBINDEX, TROPONINI in the last 168 hours. BNP (last 3 results) No results for input(s): BNP in the last 8760 hours.  ProBNP (last 3 results) No results for input(s): PROBNP in the last 8760 hours.  CBG: No results for input(s): GLUCAP in the last 168 hours.  Recent Results (from the past 240 hour(s))  Surgical pcr screen     Status: None   Collection Time: 09/02/14 12:30 PM  Result Value Ref Range Status   MRSA, PCR NEGATIVE NEGATIVE Final   Staphylococcus aureus NEGATIVE NEGATIVE Final    Comment:        The Xpert SA Assay (FDA approved for NASAL specimens in patients over 79 years of age), is one component of a comprehensive surveillance program.  Test performance has been validated by [facility] for patients greater than or equal to 79 year old. It is not intended to diagnose infection nor to guide or monitor treatment.      Studies: Dg Hip Operative Unilat  With Pelvis Left  09/02/2014   CLINICAL DATA:  Left hip fracture.  ORIF.  EXAM: OPERATIVE LEFT HIP (WITH PELVIS IF PERFORMED)  VIEWS  TECHNIQUE: Fluoroscopic spot image(s) were submitted for interpretation post-operatively.  FLUOROSCOPY TIME:  Radiation Exposure Index (as provided by the fluoroscopic device):  If the device does not provide the exposure index:  Fluoroscopy Time:  1 minutes 20 seconds  Number of Acquired Images:  0  COMPARISON:  09/01/2014  FINDINGS: Three intraoperative spot images demonstrate dynamic hip screw and lateral plate fixation across the left femoral intertrochanteric fracture. Near anatomic alignment. No hardware or bony complicating feature.  IMPRESSION: Internal fixation across the left intertrochanteric fracture. No complicating  feature.   Electronically Signed   By: Charlett Nose M.D.   On: 09/02/2014 14:17    Scheduled Meds: . sodium chloride   Intravenous Once  . albuterol  2.5 mg Nebulization Q6H  . doxazosin  4 mg Oral Daily  . enoxaparin (LOVENOX) injection  40 mg Subcutaneous Q24H  . levothyroxine  125 mcg Oral QAC breakfast  . triamterene-hydrochlorothiazide  0.5 tablet Oral Daily   Continuous Infusions: . sodium chloride 75 mL/hr at 09/04/14 0841    Active Problems:   Intertrochanteric fracture of left hip   Hypothyroidism   Hypertension   Closed left hip fracture    Time spent: 15 minutes. Greater than 50% of this time was spent in direct contact with the patient coordinating care.    Chaya Jan  [facility] Pager [phone]  If 7PM-7AM, please contact night-coverage at [email or web address], password [email or web address] 09/04/2014, 10:44 AM  LOS: 3 days

## 2014-09-04 NOTE — Progress Notes (Signed)
Patient has a blood sugar of 415, MD informed and order given.

## 2014-09-04 NOTE — Progress Notes (Signed)
Physical Therapy Treatment Patient Details Name: Andrea [name] MRN: [id number] DOB: 10/11/1925 Today's Date: 09/04/2014    History of Present Illness This is a very pleasant 81 year old lady who was out in her garden telling the swell. She tripped over a large clod of dirt and fell on to her left hip. She was unable to stand. She denied any chest pain, palpitations, dyspnea or syncopal event. There was no head injury. When she presented to the emergency room, x-rays showed presence of a left hip fracture. There was slight displacement. She is now being admitted for further management. Orthopedics, Dr. Hilda LiasKeeling, has already seen the patient and will take her to the operating room tomorrow.    PT Comments    Pt voiced no c/o.  Her pain was well controlled.  She had increased hip and knee flexion bilaterally today with AA gentle ROM.  RLE knee flexion to 80 degrees, LLE knee flexion to 65 degrees while supine.  She was [facility] needing the bed elevated to achieve enough stance to do a pivot to [facility].  She [facility] sit to stand and was able to ambulate 6' with a walker, min assist.  She is currently up in a recliner and comfortable.  She has an outstanding attitude and is highly well motivated to progress through rehab.  Follow Up Recommendations  SNF     Equipment Recommendations  Rolling walker with 5" wheels    Recommendations for Other Services  none     Precautions / Restrictions Precautions Precautions: None Restrictions Weight Bearing Restrictions: Yes LLE Weight Bearing: Touchdown weight bearing    Mobility  Bed Mobility Overal bed mobility: Needs Assistance Bed Mobility: Supine to Sit     Supine to sit: Mod assist;HOB elevated     General bed mobility comments: pt is now able to assist transfer in moving her LLE and trunk to some degree in the bed  Transfers Overall transfer level: Needs assistance Equipment used:  Rolling walker (2 wheeled) Transfers: Sit to/from [employer];Squat Pivot Transfers Sit to Stand: Mod assist;From elevated surface   Squat pivot transfers: Mod assist;From elevated surface     General transfer comment: pt [facility] in [facility]  Ambulation/Gait Ambulation/Gait assistance: Min assist Ambulation Distance (Feet): 6 Feet Assistive device: Rolling walker (2 wheeled) Gait Pattern/deviations: Decreased stride length Gait velocity: appropriate for situation                         Balance                                    Cognition Arousal/Alertness: Awake/alert Behavior During Therapy: WFL for tasks assessed/performed Overall Cognitive Status: Within Functional Limits for tasks assessed                      Exercises General Exercises - Lower Extremity Ankle Circles/Pumps: AAROM;Both;10 reps;Supine Quad Sets: AROM;Both;10 reps;Supine Gluteal Sets: Supine;AROM;Both;10 reps Short Arc Quad: AAROM;Both;10 reps;Supine Heel Slides: AAROM;Both;10 reps;Supine Hip ABduction/ADduction: AAROM;Both;10 reps;Supine            Pertinent Vitals/Pain Pain Assessment: No/denies pain  PT Goals (current goals can now be found in the care plan section) Progress towards PT goals: Progressing toward goals    Frequency  7X/week    PT Plan Current plan remains appropriate                 End of Session Equipment Utilized During Treatment: Gait belt Activity Tolerance: Patient tolerated treatment well Patient left: in chair;with call bell/phone within reach     Time: 1015-1055 PT Time Calculation (min) (ACUTE ONLY): 40 min  Charges:  $Therapeutic Exercise: 8-22 mins $Therapeutic Activity: 8-22 mins                    G CodesKonrad Penta  PT 09/04/2014, 11:04 AM [phone]

## 2014-09-05 ENCOUNTER — Encounter (HOSPITAL_COMMUNITY): Payer: Self-pay | Admitting: Orthopaedic Surgery

## 2014-09-05 LAB — TYPE AND SCREEN
ABO/RH(D): O NEG
Antibody Screen: NEGATIVE
UNIT DIVISION: 0
UNIT DIVISION: 0

## 2014-09-05 LAB — CBC WITH DIFFERENTIAL/PLATELET
BASOS ABS: 0 10*3/uL (ref 0.0–0.1)
Basophils Relative: 0 % (ref 0–1)
Eosinophils Absolute: 0 10*3/uL (ref 0.0–0.7)
Eosinophils Relative: 0 % (ref 0–5)
HEMATOCRIT: 32.7 % — AB (ref 36.0–46.0)
Hemoglobin: 10.9 g/dL — ABNORMAL LOW (ref 12.0–15.0)
LYMPHS PCT: 23 % (ref 12–46)
Lymphs Abs: 1.8 10*3/uL (ref 0.7–4.0)
MCH: 31.1 pg (ref 26.0–34.0)
MCHC: 33.3 g/dL (ref 30.0–36.0)
MCV: 93.2 fL (ref 78.0–100.0)
Monocytes Absolute: 0.8 10*3/uL (ref 0.1–1.0)
Monocytes Relative: 10 % (ref 3–12)
NEUTROS ABS: 5.4 10*3/uL (ref 1.7–7.7)
NEUTROS PCT: 67 % (ref 43–77)
PLATELETS: 208 10*3/uL (ref 150–400)
RBC: 3.51 MIL/uL — AB (ref 3.87–5.11)
RDW: 14.1 % (ref 11.5–15.5)
WBC: 8 10*3/uL (ref 4.0–10.5)

## 2014-09-05 MED ORDER — OXYCODONE HCL 5 MG PO TABS: 5.0000 mg | ORAL_TABLET | ORAL | Status: DC | PRN

## 2014-09-05 MED ORDER — ENOXAPARIN SODIUM 40 MG/0.4ML ~~LOC~~ SOLN: 40.0000 mg | SUBCUTANEOUS | Status: DC

## 2014-09-05 NOTE — Progress Notes (Signed)
Physical Therapy Treatment Patient Details Name: Andrea [name] MRN: [id number] DOB: 08/17/1925 Today's Date: 09/05/2014    History of Present Illness This is a very pleasant 81 year old lady who was out in her garden telling the swell. She tripped over a large clod of dirt and fell on to her left hip. She was unable to stand. She denied any chest pain, palpitations, dyspnea or syncopal event. There was no head injury. When she presented to the emergency room, x-rays showed presence of a left hip fracture. There was slight displacement. She is now being admitted for further management. Orthopedics, Dr. Hilda Lias, has already seen the patient and will take her to the operating room tomorrow.    PT Comments    Pt continues to progress well with pain well controlled.  Her transfer ability OOB is improving with HOB elevated to a lesser degree.  She is now able to flex both knees to 90 degrees after gentle ROM and this allows her to stand from sitting with less effort.  She was instructed in gait using a walker with instruction again in TTWB,  She has difficulty maintaining a true TTWB gait but has no significant pain during gait.  Follow Up Recommendations  SNF     Equipment Recommendations  Rolling walker with 5" wheels    Recommendations for Other Services  none     Precautions / Restrictions Precautions Precautions: None Restrictions LLE Weight Bearing: Touchdown weight bearing Other Position/Activity Restrictions: This is not recorded in the chart but is a part of Dr. Jenetta Downer standard post op protocol    Mobility  Bed Mobility Overal bed mobility: Needs Assistance Bed Mobility: Supine to Sit     Supine to sit: Min assist     General bed mobility comments: HOB elevated to a lesser degree today and pt is now able to move trunk and LLE almost by herself with VC for correct technique  Transfers Overall transfer level: Needs assistance Equipment used: Rolling walker (2  wheeled) Transfers: Sit to/from Stand Sit to Stand: Mod assist            Ambulation/Gait Ambulation/Gait assistance: Min assist Ambulation Distance (Feet): 30 Feet Assistive device: Rolling walker (2 wheeled) Gait Pattern/deviations: Decreased stride length;Decreased weight shift to left;Decreased stance time - left Gait velocity: appropriate for situation   General Gait Details: pt again instructed in TTWB left gait                                                         Cognition Arousal/Alertness: Awake/alert Behavior During Therapy: WFL for tasks assessed/performed Overall Cognitive Status: Within Functional Limits for tasks assessed                      Exercises General Exercises - Lower Extremity Ankle Circles/Pumps: AAROM;Both;10 reps;Supine Quad Sets: AROM;Both;10 reps;Supine Gluteal Sets: Supine;AROM;Both;10 reps Short Arc Quad: AAROM;Both;10 reps;Supine Heel Slides: AAROM;Both;10 reps;Supine Hip ABduction/ADduction: AAROM;Both;10 reps;Supine            Pertinent Vitals/Pain Pain Assessment: No/denies pain                                      PT Goals (current goals can now be found in the care  plan section) Progress towards PT goals: Progressing toward goals    Frequency  7X/week    PT Plan Current plan remains appropriate                 End of Session Equipment Utilized During Treatment: Gait belt Activity Tolerance: Patient tolerated treatment well Patient left: in chair;with call bell/phone within reach     Time: 0830-0910 PT Time Calculation (min) (ACUTE ONLY): 40 min  Charges:  $Gait Training: 8-22 mins $Therapeutic Exercise: 8-22 mins                    G CodesKonrad Penta:      Sholom Dulude L  PT 09/05/2014, 10:20 AM [phone]

## 2014-09-05 NOTE — Progress Notes (Signed)
[facility],report called,and given to Genuine PartsLinda Newman RN. Vital signs stable. No c/o pain or discomfort noted. Accompanied by staff to an awaiting vehicle.

## 2014-09-05 NOTE — Clinical Social Work Note (Signed)
Pt d/c today to SNF. Pt [facility] bed [facility] [place]. D/C summary faxed. Facility unable to provide transportation. Pt is arranging with either family or friend.  Derenda FennelKara Mindy Behnken, LCSW [phone]

## 2014-09-05 NOTE — Care Management Note (Addendum)
Case Management Note  Patient Details  Name: Andrea [name] MRN: [id number] Date of Birth: 06/12/1925   Expected Discharge Date:                  Expected Discharge Plan:  Home/Self Care  In-House Referral:  Clinical Social Work  Discharge planning Services  CM Consult  Post Acute Care Choice:  NA Choice offered to:  NA  DME Arranged:    DME Agency:     HH Arranged:    HH Agency:     Status of Service:  Completed, signed off  Medicare Important Message Given:  Yes Date Medicare IM Given:  09/02/14 Medicare IM give by:  Kathyrn SheriffJessica Sreenidhi Ganson, RN, MSN, CM  Date Additional Medicare IM Given:  09/05/14 Additional Medicare Important Message give by:  Kathyrn SheriffJessica Faatimah Spielberg, RN, MSN, CM   If discussed at Long Length of Stay Meetings, dates discussed:    Additional Comments: [facility] [place] [facility] today. CSW has made arrangments for placement. Pt and RN aware of discharge plan. No CM needs at this time.   [name], [name], RN 09/05/2014, 10:55 AM

## 2014-09-05 NOTE — Clinical Social Work Placement (Signed)
   CLINICAL SOCIAL WORK PLACEMENT  NOTE  Date:  09/05/2014  Patient Details  Name: Andrea [name] MRN: [id number] Date of Birth: 08/05/1925  Clinical Social Work is seeking post-discharge placement for this patient at the Skilled  Nursing Facility level of care (*CSW will initial, date and re-position this form in  chart as items are completed):  Yes   Patient/family provided with [facility] Clinical Social Work Department's list of facilities offering this level of care within the geographic area requested by the patient (or if unable, by the patient's family).  Yes   Patient/family informed of their freedom to choose among providers that offer the needed level of care, that participate in Medicare, Medicaid or managed care program needed by the patient, have an available bed and are willing to accept the patient.  Yes   Patient/family informed of 's ownership interest in [facility] and [facility], as well as of the fact that they are under no obligation to receive care at these facilities.  PASRR submitted to EDS on 09/02/14     PASRR number received on 09/02/14     Existing PASRR number confirmed on       FL2 transmitted to all facilities in geographic area requested by pt/family on 09/02/14     FL2 transmitted to all facilities within larger geographic area on       Patient informed that his/her managed care company has contracts with or will negotiate with certain facilities, including the following:        Yes   Patient/family informed of bed offers received.  [facility] [place]     Physician recommends and patient chooses bed at      Patient to be [facility] [place] on 09/05/14.  Patient to be transferred to facility by pt arranging with friend or family     Patient family notified on 09/05/14 of transfer.  Name of family member notified:  pt refused and stated she had already contacted family      PHYSICIAN       Additional Comment:    _______________________________________________ Karn CassisStultz, Cyerra Yim Shanaberger, LCSW 09/05/2014, 11:29 AM [phone]

## 2014-09-05 NOTE — Discharge Summary (Signed)
Physician Discharge Summary  Andrea [name] WJX:[id number] DOB: 06/25/1925 DOA: 09/01/2014  PCP: Pcp Not In System  Admit date: 09/01/2014 Discharge date: 09/05/2014  Time spent: 45 minutes  Recommendations for Outpatient Follow-up:  -Will be discharged to skilled nursing facility today for short-term rehabilitation status post hip fracture. - She will need to continue the enoxaparin 40 daily for one month post operative. -She will need physical therapy with walker touch down only on left. -She will need staples removed from the left hip area on June 10th. Steristrip wound. -Follow up with Dr. Hilda LiasKeeling in one month. X-rays of the left hip prior to the appointment if still in the nursing home.  Discharge Diagnoses:  Active Problems:   Intertrochanteric fracture of left hip   Hypothyroidism   Hypertension   Closed left hip fracture   Discharge Condition: Stable and improved  Filed Weights   09/01/14 1301 09/01/14 1840 09/01/14 2120  Weight: 81.647 kg (180 lb) 81.647 kg (180 lb) 80.6 kg (177 lb 11.1 oz)    History of present illness:  Andrea [name] is a 81 y.o. female  This is a very pleasant 81 year old lady who was out in her garden telling the soil. She tripped over a large clod of dirt and fell on to her left hip. She was unable to stand. She denied any chest pain, palpitations, dyspnea or syncopal event. There was no head injury. When she presented to the emergency room, x-rays showed presence of a left hip fracture. There was slight displacement. She is now being admitted for further management. Orthopedics, Dr. Hilda LiasKeeling, has already seen the patient and will take her to the operating room tomorrow.   Hospital Course:   Left hip intertrochanteric fracture -Status post repair by Dr. Hilda LiasKeeling on 6/3. -Only has mild pain today. -To SNF upon discharge today -Lovenox for DVT prophylaxis for 30 days.  Hypothyroidism -Continue  Synthroid  Hypertension -Stable  Procedures:  Left hip repair   Consultations:  Dr. Hilda LiasKeeling, orthopedics  Discharge Instructions  Discharge Instructions    Diet - low sodium heart healthy    Complete by:  As directed      Increase activity slowly    Complete by:  As directed             Medication List    TAKE these medications        doxazosin 4 MG tablet  Commonly known as:  CARDURA  Take 4 mg by mouth daily.     enoxaparin 40 MG/0.4ML injection  Commonly known as:  LOVENOX  Inject 0.4 mLs (40 mg total) into the skin daily. For 30 days     levothyroxine 125 MCG tablet  Commonly known as:  SYNTHROID, LEVOTHROID  Take 125 mcg by mouth daily.     naproxen sodium 220 MG tablet  Commonly known as:  ANAPROX  Take 220-440 mg by mouth daily as needed (for artritis pain).     oxyCODONE 5 MG immediate release tablet  Commonly known as:  Oxy IR/ROXICODONE  Take 1 tablet (5 mg total) by mouth every 4 (four) hours as needed for severe pain.     triamterene-hydrochlorothiazide 37.5-25 MG per tablet  Commonly known as:  MAXZIDE-25  Take 0.5 tablets by mouth daily.       No Known Allergies     Follow-up Information    Follow up with Darreld McleanKEELING,WAYNE, MD. Schedule an appointment as soon as possible for a visit in 1 month.   Specialty:  Orthopedic Surgery   Contact information:   [address] [place] [address] [phone]        The results of significant diagnostics from this hospitalization (including imaging, microbiology, ancillary and laboratory) are listed below for reference.    Significant Diagnostic Studies: Dg Chest 1 View  09/01/2014   CLINICAL DATA:  Left hip fracture.  Fall today.  EXAM: CHEST  1 VIEW  COMPARISON:  None.  FINDINGS: Bony demineralization.  Degenerative spurring at both [facility] joints.  Atherosclerotic calcification and tortuosity of the thoracic aorta. Apical lordotic projection. Upper normal heart size. Faint interstitial  accentuation peripherally in both lungs. Thoracic spondylosis.  IMPRESSION: 1. Peripheral interstitial accentuation in both lungs could reflect mild fibrosis but is technically nonspecific. 2. Atherosclerotic aortic arch. 3. Degenerative AC joint arthropathy bilaterally.   Electronically Signed   By: Gaylyn Rong M.D.   On: 09/01/2014 15:28   Dg Tibia/fibula Left  09/01/2014   CLINICAL DATA:  Left hip pain radiating down the leg status post fall in the garden striking the left hip  EXAM: LEFT TIBIA AND FIBULA - 2 VIEW  COMPARISON:  None.  FINDINGS: The left tibia and fibula are adequately mineralized. There is no acute fracture. There is moderate degenerative change of all 3 joint compartments of the left knee. The left ankle is only partially included in the field of view. The soft tissues of the leg are normal.  IMPRESSION: There is moderate tricompartmental degenerative change of the left knee. There is no acute bony abnormality of the left tibia or fibula. The tip of the medial malleolus is excluded from the field of view but is included on an accompanying ankle series   Electronically Signed   By: David  [place] M.D.   On: 09/01/2014 15:28   Dg Ankle Complete Left  09/01/2014   CLINICAL DATA:  Left hip pain which radiates down left leg  EXAM: LEFT ANKLE COMPLETE - 3+ VIEW  COMPARISON:  None.  FINDINGS: There is no evidence of fracture, dislocation, or joint effusion. There is no evidence of arthropathy or other focal bone abnormality. Soft tissues are unremarkable.  IMPRESSION: Negative.   Electronically Signed   By: Signa Kell M.D.   On: 09/01/2014 15:29   Ct Head Wo Contrast  09/01/2014   CLINICAL DATA:  Pain following fall  EXAM: CT HEAD WITHOUT CONTRAST  TECHNIQUE: Contiguous axial images were obtained from the base of the skull through the vertex without intravenous contrast.  COMPARISON:  None.  FINDINGS: There is mild diffuse atrophy. There is no intracranial mass, hemorrhage, extra-axial  fluid collection, or midline shift. There is patchy small vessel disease throughout the centra semiovale bilaterally. No acute infarct is apparent. The bony calvarium appears intact. The mastoid air cells are clear. There is mild mucosal thickening in each maxillary antra, more on the right than on the left.  IMPRESSION: Atrophy with patchy small vessel disease in the centra semiovale bilaterally. No intracranial mass, hemorrhage, or extra-axial fluid collection. No acute appearing infarct. Mild paranasal sinus disease in the maxillary antra.   Electronically Signed   By: Bretta Bang III M.D.   On: 09/01/2014 14:26   Dg Hip Unilat With Pelvis 1v Left  09/01/2014   CLINICAL DATA:  Left hip pain radiating down the left leg.  Fall.  EXAM: LEFT HIP (WITH PELVIS) 1 VIEW  COMPARISON:  None.  FINDINGS: Acute intertrochanteric fracture of the left proximal femur. The medial portion of this fractures strays  close to the base of the femoral neck.  Lower lumbar spondylosis and degenerative disc disease.  Bony demineralization.  IMPRESSION: 1. Acute left intertrochanteric hip fracture. This strays close to the femoral neck margin medially. 2. Bony demineralization and the hip fracture indicate osteoporosis. 3. Lower lumbar spondylosis and degenerative disc disease.   Electronically Signed   By: Gaylyn Rong M.D.   On: 09/01/2014 15:27   Dg Hip Operative Unilat With Pelvis Left  09/02/2014   CLINICAL DATA:  Left hip fracture.  ORIF.  EXAM: OPERATIVE LEFT HIP (WITH PELVIS IF PERFORMED)  VIEWS  TECHNIQUE: Fluoroscopic spot image(s) were submitted for interpretation post-operatively.  FLUOROSCOPY TIME:  Radiation Exposure Index (as provided by the fluoroscopic device):  If the device does not provide the exposure index:  Fluoroscopy Time:  1 minutes 20 seconds  Number of Acquired Images:  0  COMPARISON:  09/01/2014  FINDINGS: Three intraoperative spot images demonstrate dynamic hip screw and lateral plate fixation  across the left femoral intertrochanteric fracture. Near anatomic alignment. No hardware or bony complicating feature.  IMPRESSION: Internal fixation across the left intertrochanteric fracture. No complicating feature.   Electronically Signed   By: Charlett Nose M.D.   On: 09/02/2014 14:17   Dg Femur Min 2 Views Left  09/01/2014   CLINICAL DATA:  Left hip pain after falling today. Initial encounter.  EXAM: LEFT FEMUR 2 VIEWS  COMPARISON:  None.  FINDINGS: The bones are demineralized. There is a mildly displaced intertrochanteric left femur fracture. The femoral head is intact without dislocation. There is no evidence of pelvic fracture. Degenerative changes are noted within the lower lumbar spine and at the left knee.  IMPRESSION: Mildly displaced intertrochanteric left femur fracture.   Electronically Signed   By: Carey Bullocks M.D.   On: 09/01/2014 15:26    Microbiology: Recent Results (from the past 240 hour(s))  Surgical pcr screen     Status: None   Collection Time: 09/02/14 12:30 PM  Result Value Ref Range Status   MRSA, PCR NEGATIVE NEGATIVE Final   Staphylococcus aureus NEGATIVE NEGATIVE Final    Comment:        The Xpert SA Assay (FDA approved for NASAL specimens in patients over 74 years of age), is one component of a comprehensive surveillance program.  Test performance has been validated by [facility] for patients greater than or equal to 64 year old. It is not intended to diagnose infection nor to guide or monitor treatment.      Labs: Basic Metabolic Panel:  Recent Labs Lab 09/01/14 1616 09/02/14 0647 09/03/14 0614  NA 140 141 137  K 3.9 4.1 4.0  CL 104 107 106  CO2 GLUCOSE 118* 115* 122*  BUN 21* 22* 18  CREATININE 1.11* 1.14* 1.04*  CALCIUM 9.4 8.8* 8.3*   Liver Function Tests:  Recent Labs Lab 09/02/14 0647  AST 37  ALT 25  ALKPHOS 71  BILITOT 1.1  PROT 6.0*  ALBUMIN 3.3*   No results for input(s): LIPASE, AMYLASE in the last 168  hours. No results for input(s): AMMONIA in the last 168 hours. CBC:  Recent Labs Lab 09/01/14 1616 09/02/14 0647 09/03/14 0614 09/05/14 0750  WBC 13.6* 6.6 7.7 8.0  NEUTROABS 12.0* 4.9  --  5.4  HGB 13.9 13.1 11.5* 10.9*  HCT 41.2 39.8 34.7* 32.7*  MCV 91.8 92.8 93.5 93.2  PLT 294 259 213 208   Cardiac Enzymes: No results for input(s): CKTOTAL, CKMB, CKMBINDEX,  TROPONINI in the last 168 hours. BNP: BNP (last 3 results) No results for input(s): BNP in the last 8760 hours.  ProBNP (last 3 results) No results for input(s): PROBNP in the last 8760 hours.  CBG: No results for input(s): GLUCAP in the last 168 hours.     SignedChaya Jan  [facility] Pager: [phone] 09/05/2014, 10:15 AM

## 2014-09-05 NOTE — Progress Notes (Signed)
Subjective: 3 Days Post-Op Procedure(s) (LRB): OPEN REDUCTION INTERNAL FIXATION LEFT HIP FRACTURE (Left) Patient reports pain as mild.    Objective: Vital signs in last 24 hours: Temp:  [98.1 F (36.7 C)-98.7 F (37.1 C)] 98.1 F (36.7 C) (06/06 0517) Pulse Rate:  [81-95] 81 (06/06 0517) Resp:  [18-20] 20 (06/06 0517) BP: (115-121)/(51-55) 115/51 mmHg (06/06 0517) SpO2:  [93 %-96 %] 95 % (06/06 0517)  Intake/Output from previous day: 06/05 0701 - 06/06 0700 In: 2528.8 [P.O.:720; I.V.:1808.8] Out: 1500 [Urine:1500] Intake/Output this shift:     Recent Labs  09/03/14 0614  HGB 11.5*    Recent Labs  09/03/14 0614  WBC 7.7  RBC 3.71*  HCT 34.7*  PLT 213    Recent Labs  09/03/14 0614  NA 137  K 4.0  CL 106  CO2 24  BUN 18  CREATININE 1.04*  GLUCOSE 122*  CALCIUM 8.3*   No results for input(s): LABPT, INR in the last 72 hours.  Neurologically intact Neurovascular intact Sensation intact distally Intact pulses distally Dorsiflexion/Plantar flexion intact  Assessment/Plan: 3 Days Post-Op Procedure(s) (LRB): OPEN REDUCTION INTERNAL FIXATION LEFT HIP FRACTURE (Left) Discharge to SNF when bed available.  She will need to continue the enoxaparin 40 daily for one month post operative. She will need physical therapy with walker touch down only on left. She will need staples removed from the left hip area on June 10th.  Steristrip wound. I will need to see her in my office in one month.  X-rays of the left hip prior to the appointment if still in the nursing home.   Jolane Bankhead 09/05/2014, 7:09 AM

## 2015-01-29 ENCOUNTER — Encounter (HOSPITAL_COMMUNITY): Payer: Self-pay | Admitting: *Deleted

## 2015-01-29 ENCOUNTER — Emergency Department (HOSPITAL_COMMUNITY): Payer: Medicare Other

## 2015-01-29 ENCOUNTER — Emergency Department (HOSPITAL_COMMUNITY)
Admission: EM | Admit: 2015-01-29 | Discharge: 2015-01-29 | Disposition: A | Discharge status: Home / Self Care | Payer: Medicare Other | Attending: Emergency Medicine | Admitting: Emergency Medicine

## 2015-01-29 DIAGNOSIS — E079 Disorder of thyroid, unspecified: Secondary | ICD-10-CM | POA: Insufficient documentation

## 2015-01-29 DIAGNOSIS — F419 Anxiety disorder, unspecified: Secondary | ICD-10-CM | POA: Diagnosis not present

## 2015-01-29 DIAGNOSIS — R350 Frequency of micturition: Secondary | ICD-10-CM | POA: Diagnosis not present

## 2015-01-29 DIAGNOSIS — Z79899 Other long term (current) drug therapy: Secondary | ICD-10-CM | POA: Insufficient documentation

## 2015-01-29 LAB — I-STAT CHEM 8, ED
BUN: 17 mg/dL (ref 6–20)
CALCIUM ION: 1.23 mmol/L (ref 1.13–1.30)
CHLORIDE: 105 mmol/L (ref 101–111)
CREATININE: 0.9 mg/dL (ref 0.44–1.00)
GLUCOSE: 110 mg/dL — AB (ref 65–99)
HCT: 45 % (ref 36.0–46.0)
Hemoglobin: 15.3 g/dL — ABNORMAL HIGH (ref 12.0–15.0)
POTASSIUM: 4.3 mmol/L (ref 3.5–5.1)
Sodium: 142 mmol/L (ref 135–145)
TCO2: 27 mmol/L (ref 0–100)

## 2015-01-29 LAB — URINALYSIS, ROUTINE W REFLEX MICROSCOPIC
Bilirubin Urine: NEGATIVE
Glucose, UA: NEGATIVE mg/dL
KETONES UR: NEGATIVE mg/dL
Leukocytes, UA: NEGATIVE
NITRITE: NEGATIVE
PH: 7.5 (ref 5.0–8.0)
Protein, ur: NEGATIVE mg/dL
SPECIFIC GRAVITY, URINE: 1.02 (ref 1.005–1.030)
UROBILINOGEN UA: 0.2 mg/dL (ref 0.0–1.0)

## 2015-01-29 LAB — URINE MICROSCOPIC-ADD ON

## 2015-01-29 NOTE — ED Notes (Signed)
Patient assisted to bedpan, tolerated well 

## 2015-01-29 NOTE — ED Provider Notes (Signed)
MSE was initiated and I personally evaluated the patient and placed orders (if any) at  6:44 AM on January 29, 2015. Patient reports she's been having urinary frequency. As soon as I enter the room she states she had to use the bathroom again he didn't think she could hold it. She also felt like she needed to have a BM. I left and notified nursing staff patient needed assistance to use the bathroom.  The patient appears stable so that the remainder of the MSE may be completed by another provider.  Devoria AlbeIva Noura Purpura, MD 01/29/15 [phone]

## 2015-01-29 NOTE — Discharge Instructions (Signed)
°Emergency Department Resource Guide °1) Find a Doctor and Pay Out of Pocket °Although you won't have to find out who is covered by your insurance plan, it is a good idea to ask around and get recommendations. You will then need to call the office and see if the doctor you have chosen will accept you as a new patient and what types of options they offer for patients who are self-pay. Some doctors offer discounts or will set up payment plans for their patients who do not have insurance, but you will need to ask so you aren't surprised when you get to your appointment. ° °2) Contact Your Local Health Department °Not all health departments have doctors that can see patients for sick visits, but many do, so it is worth a call to see if yours does. If you don't know where your local health department is, you can check in your phone book. The CDC also has a tool to help you locate your state's health department, and many state websites also have listings of all of their local health departments. ° °3) Find a Walk-in Clinic °If your illness is not likely to be very severe or complicated, you may want to try a walk in clinic. These are popping up all over the country in pharmacies, drugstores, and shopping centers. They're usually staffed by nurse practitioners or physician assistants that have been trained to treat common illnesses and complaints. They're usually fairly quick and inexpensive. However, if you have serious medical issues or chronic medical problems, these are probably not your best option. ° °No Primary Care Doctor: °- Call [facility] at  832-8000 - they can help you locate a primary care doctor that  accepts your insurance, provides certain services, etc. °- Physician Referral Service- 1-800-533-3463 ° °Chronic Pain Problems: °Organization         Address  Phone   Notes  °[facility]  (336) 297-2271 Patients need to be referred by their primary care doctor.  ° °Medication  Assistance: °Organization         Address  Phone   Notes  °[place] [facility] [address] °[place], [place] [address] (336) 641-8030 --Must be a resident of [place] °-- Must have NO insurance coverage whatsoever (no Medicaid/ Medicare, etc.) °-- The pt. MUST have a primary care doctor that directs their care regularly and follows them in the community °  °[facility]  (866) 331-1348   °[employer]  (888) 892-1162   ° °Agencies that provide inexpensive medical care: °Organization         Address  Phone   Notes  °[facility]  (336) 832-8035   °[facility]    (336) 832-7272   °[facility] [address] °[place], [place] [address] (336) 832-4777   °[facility] [address], °[place] (336) 271-4999   °[facility]    (336) 373-0678   °[facility]    (336) 272-1050   °[facility] ° [address], [place] Phone:  (336) 832-4444, Fax:  (336) 832-4440 Hours of Operation:  9 am - 6 pm, M-F.  Also accepts Medicaid/Medicare and self-pay.  °[facility] ° [address], [place] Phone: (336) [phone], Fax: (336) [phone]. Hours of Operation:  8:30 am - 5:30 pm, M-F.  Also accepts Medicaid and self-pay.  °[facility] [place] [address], [place] Phone: (336) 878-6027   °[facility] [address], [place], [place] (336)723-1848, Ext. 123 Mondays & Thursdays: 7-9 AM.  First 15 patients are seen on a first come, first serve basis. °  ° °Medicaid-accepting [place] Providers: ° °Organization         Address  Phone   Notes  °[facility] [address], [place] (336) 641-2100 Also accepts self-pay patients.  °[facility] [address], [place] ° (336) 856-9996   °[facility] [address], [place]  (336) 288-8857   °[facility] [address] [place] [address], [place] (336) 299-7000   °Veita Bland [address], [place]  ° (336) 373-1557 Only accepts [place] Access Medicaid patients after they have their name applied to their card.  ° °Self-Pay (no insurance) in [place]: ° °Organization         Address  Phone   Notes  °Sickle Cell Patients, [facility] [address], [place] (336) 832-1970   °[facility] [address], [place] (336) 832-4400   °[facility] ° [address], [place] (336) 992-4800   °[facility]/Dr. Osei-Bonsu ° [address] [place] [address], [place] or [address], [place] (336) 841-8500 Phone number for both [place] and [place] locations is the same.  °[facility] [address], [place] (336) 299-0000   °[facility] [address] [place] [address], [place] or [address] (336) 852-7530 °(336) 878-2260   °[facility] [address], [place] (336) 350-1642, phone; (336) 294-5005, fax Sees patients 1st and 3rd Saturday of every month.  Must not qualify for public or private insurance (i.e. Medicaid, Medicare, [place] Health Choice, Veterans' Benefits) • Household income should be no more than 200% of the poverty level •The clinic cannot treat you if you are pregnant or think you are pregnant • Sexually transmitted diseases are not treated at the clinic.  ° ° °Dental Care: °Organization         Address  Phone  Notes  °[place] [facility] [address], [place] (336) 641-6152 Accepts children up to age 21 who are enrolled in Medicaid or [place] Health Choice; pregnant women with a Medicaid card; and children who have applied for Medicaid or [place] Health Choice, but were declined, whose parents can pay a reduced fee at time of service.  °[place]  [facility] [place]  [address], [place] (336) 641-7733 Accepts children up to age 21 who are enrolled in Medicaid or [place] Health Choice; pregnant women with a Medicaid card; and children who have applied for Medicaid or [place] Health Choice, but were declined, whose parents can pay a reduced fee at time of service.  °[facility] ° [address], [place] (336) 641-4533 Patients are seen by appointment only. Walk-ins are not accepted. [facility] will see patients 18 years of age and older. °Monday - Tuesday (8am-5pm) °Most Wednesdays (8:30-5pm) °$30 per visit, cash only  °[facility] ° [address], [place] (336) 641-4533 Patients are seen by appointment only. Walk-ins are not accepted. [facility] will see patients 18 years of age and older. °One   Wednesday Evening (Monthly: Volunteer Based).  $30 per visit, cash only  °[facility]  (919) 537-3737 for adults; Children under age 4, call Graduate Pediatric Dentistry at ([phone]) [phone]. Children aged 4-14, please call (919) [phone] to request a pediatric application. ° Dental services are provided in all areas of dental care including fillings, crowns and bridges, complete and partial dentures, implants, gum treatment, root canals, and extractions. Preventive care is also provided. Treatment is provided to both adults and children. °Patients are selected via a lottery and there is often a waiting list. °  °[facility] [address], °[place] ° (336) 763-8833 [email or web address] °  °[facility] [address], [place], [place] (336)723-1848, Ext. 123 Second and Fourth Thursday of each month, opens at 6:30 AM; Clinic ends at 9 AM.  Patients are seen on a first-come first-served basis, and a limited number are seen during each clinic.  ° °[facility] ° [address], [place], [place] (336) 723-7904    Eligibility Requirements °You must have lived in [place], [place], or [place] counties for at least the last three months. °  You cannot be eligible for state or federal sponsored healthcare insurance, including Veterans Administration, Medicaid, or Medicare. °  You generally cannot be eligible for healthcare insurance through your employer.  °  How to apply: °Eligibility screenings are held every Tuesday and Wednesday afternoon from 1:00 pm until 4:00 pm. You do not need an appointment for the interview!  °[facility] [address], [place], [place] 336-631-2330   °[facility]  336-342-8273   °[facility]  336-703-3100   °[facility]  336-570-6415   ° °Behavioral Health Resources in the Community: °Intensive Outpatient Programs °Organization         Address  Phone  Notes  °[facility] [address], [place], [place] 336-878-6098   °[facility] [address], [place], [place] 336-832-9800   °[facility] [address], [place], [place] ° 336-882-2125   °[facility] [address],  °[place], [place] 1-800-853-5163 or 336-641-4981   °Substance Abuse Resources °Organization         Address  Phone  Notes  °[facility]  336-882-2125   °[facility]  336-784-9470   °[facility]  336-285-9073   °[facility]  336-845-3988   °Residential & Outpatient Substance Abuse Program  1-800-659-3381   °Psychological Services °Organization         Address  Phone  Notes  °[facility]  336- 832-9600   °[facility]  336- 378-7881   °[facility] [address], [place] 1-800-853-5163 or 336-641-4981   ° °Mobile Crisis Teams °Organization         Address  Phone  Notes  °[facility], Mobile Crisis Care Unit  1-877-626-1772   °Assertive °[facility] ° [address]  [place], [place] 336-834-9664   °Sharon DeEsch [address] °[place] 336-554-5454   ° °Self-Help/Support Groups °Organization         Address  Phone             Notes  °[facility]  - variety of support groups  336- 373-1402 Call for more information  °Narcotics Anonymous (NA), [facility] [address], °[place]  2 meetings at this location  ° °  Residential Treatment Programs Organization         Address  Phone  Notes  [facility] [address],    [place]  [phone]) [phone]   [facility]  [address], [place] [phone], [place], [place] [phone]   [facility] [address], [place] [phone] Admissions: 8am-3pm M-F  [facility] [address]    [place], [place] [phone]   [facility] [address], [place], [place] [phone]   [facility] [address]  [place], [place] [phone]   [facility] Intensive Outpatient [address], [place], [place] [phone]   [facility] [address]  [place], [place] [phone] or [phone]   [facility] ([facility] [address] [place], [place] [phone] Accepts Medicaid  [facility] [address]  [place] [phone] Substance Abuse/Addiction Treatment   [facility]         Address  Phone  Notes  [facility]  [phone]) [phone]   Angie FavaJulie Brannon, PhD [address], [address] [place], [place]   [phone](336) [phone] or [phone](336) [phone]   [facility]   [address] [place], [place] [phone](336) [phone]   [facility] [address], [place], [place] ([phone]) [phone](336) [phone] Insurance/Medicaid/sponsorship through [facility] [address]                                    [place], [place] [phone](336) [phone] Therapy/tele-psych/case    [facility] [address]   [place], [place] [phone]) [phone]    Dr. Lolly MustacheArfeen  [phone](336) [phone]   [facility] [place] [facility]  [employer] [facility] 1) [address], [place] 2) [address], [place] 3)  [address], [place] [phone](336) [phone](336) [phone]  [phone](336) [phone]   [facility] Child Abuse Hot[facility] [phone](336) [phone] or [phone](336) [phone] (After Hours)      Take your usual prescriptions as previously directed. Your urine dip today did not clearly show an infection; a urine culture was ordered and is pending results.  Call your regular medical doctor tomorrow to schedule a follow up appointment within the next 2 days to follow up these results.  Return to the Emergency Department immediately sooner if worsening.

## 2015-01-29 NOTE — ED Provider Notes (Signed)
CSN: [id number]     Arrival date & time 01/29/15  0415 History   First MD Initiated Contact with Patient 01/29/15 [phone]     Chief Complaint  Patient presents with  . Urinary Frequency      HPI Pt was seen at 0745. Per pt, c/o gradual onset and persistence of constant urinary frequency for the past 2 to 3 days.  Denies dysuria, no flank pain, no back pain, no fevers, no abd pain, no N/V/D, no vaginal bleeding/discharge, no rash. Pt also endorses she "just saw Dr. Hilda Lias" regarding "needing another hip operation" for "a loose pin" which has "made me nervous."    Past Medical History  Diagnosis Date  . Thyroid disease    Past Surgical History  Procedure Laterality Date  . Abdominal hysterectomy    . Orif hip fracture Left 09/02/2014    Procedure: OPEN REDUCTION INTERNAL FIXATION LEFT HIP FRACTURE;  Surgeon: Darreld Mclean, MD;  Location: [facility];  Service: Orthopedics;  Laterality: Left;    Social History  Substance Use Topics  . Smoking status: Never Smoker   . Smokeless tobacco: None  . Alcohol Use: No    Review of Systems ROS: Statement: All systems negative except as marked or noted in the HPI; Constitutional: Negative for fever and chills. ; ; Eyes: Negative for eye pain, redness and discharge. ; ; ENMT: Negative for ear pain, hoarseness, nasal congestion, sinus pressure and sore throat. ; ; Cardiovascular: Negative for chest pain, palpitations, diaphoresis, dyspnea and peripheral edema. ; ; Respiratory: Negative for cough, wheezing and stridor. ; ; Gastrointestinal: Negative for nausea, vomiting, diarrhea, abdominal pain, blood in stool, hematemesis, jaundice and rectal bleeding. . ; ; Genitourinary: +urinary frequency. Negative for dysuria, flank pain and hematuria. ; ; Musculoskeletal: Negative for back pain and neck pain. Negative for swelling and trauma.; ; Skin: Negative for pruritus, rash, abrasions, blisters, bruising and skin lesion.; ; Neuro: Negative for headache,  lightheadedness and neck stiffness. Negative for weakness, altered level of consciousness , altered mental status, extremity weakness, paresthesias, involuntary movement, seizure and syncope.; Psych:  +anxiety. No SI, no SA, no HI, no hallucinations.      Allergies  Review of patient's allergies indicates no known allergies.  Home Medications   Prior to Admission medications   Medication Sig Start Date End Date Taking? Authorizing Provider  doxazosin (CARDURA) 4 MG tablet Take 4 mg by mouth daily. 06/13/14   Historical Provider, MD  enoxaparin (LOVENOX) 40 MG/0.4ML injection Inject 0.4 mLs (40 mg total) into the skin daily. For 30 days 09/05/14   Henderson Cloud, MD  levothyroxine (SYNTHROID, LEVOTHROID) 125 MCG tablet Take 125 mcg by mouth daily. 06/13/14   Historical Provider, MD  naproxen sodium (ANAPROX) 220 MG tablet Take 220-440 mg by mouth daily as needed (for artritis pain).    Historical Provider, MD  oxyCODONE (OXY IR/ROXICODONE) 5 MG immediate release tablet Take 1 tablet (5 mg total) by mouth every 4 (four) hours as needed for severe pain. 09/05/14   Henderson Cloud, MD  triamterene-hydrochlorothiazide (MAXZIDE-25) 37.5-25 MG per tablet Take 0.5 tablets by mouth daily. 06/13/14   Historical Provider, MD   BP 181/71 mmHg  Pulse 92  Temp(Src) 98.1 F (36.7 C) (Oral)  Resp 20  Ht  (1.626 m)  Wt 165 lb (74.844 kg)  BMI 28.31 kg/m2  SpO2 97% Physical Exam  0750: Physical examination:  Nursing notes reviewed; Vital signs and O2 SAT reviewed;  Constitutional: Well  developed, Well nourished, Well hydrated, In no acute distress; Head:  Normocephalic, atraumatic; Eyes: EOMI, PERRL, No scleral icterus; ENMT: Mouth and pharynx normal, Mucous membranes moist; Neck: Supple, Full range of motion, No lymphadenopathy; Cardiovascular: Regular rate and rhythm, No gallop; Respiratory: Breath sounds clear & equal bilaterally, No wheezes.  Speaking full sentences with ease,  Normal respiratory effort/excursion; Chest: Nontender, Movement normal; Abdomen: Soft, Nontender, Nondistended, Normal bowel sounds; Genitourinary: No CVA tenderness; Spine:  No midline CS, TS, LS tenderness.;; Extremities: Pulses normal, No tenderness, No edema, No calf edema or asymmetry.; Neuro: AA&Ox3, Major CN grossly intact.  Speech clear. No gross focal motor or sensory deficits in extremities.; Skin: Color normal, Warm, Dry.; Psych:  Anxious.   ED Course  Procedures (including critical care time) Labs Review   Imaging Review  I have personally reviewed and evaluated these images and lab results as part of my medical decision-making.   EKG Interpretation None      MDM  MDM Reviewed: previous chart, nursing note and vitals Reviewed previous: labs Interpretation: labs and CT scan   Results for orders placed or performed during the hospital encounter of 01/29/15  Urinalysis, Routine w reflex microscopic  Result Value Ref Range   Color, Urine YELLOW YELLOW   APPearance CLEAR CLEAR   Specific Gravity, Urine 1.020 1.005 - 1.030   pH 7.5 5.0 - 8.0   Glucose, UA NEGATIVE NEGATIVE mg/dL   Hgb urine dipstick MODERATE (A) NEGATIVE   Bilirubin Urine NEGATIVE NEGATIVE   Ketones, ur NEGATIVE NEGATIVE mg/dL   Protein, ur NEGATIVE NEGATIVE mg/dL   Urobilinogen, UA 0.2 0.0 - 1.0 mg/dL   Nitrite NEGATIVE NEGATIVE   Leukocytes, UA NEGATIVE NEGATIVE  Urine microscopic-add on  Result Value Ref Range   Squamous Epithelial / LPF RARE RARE   RBC / HPF 0-2 <3 RBC/hpf  I-stat Chem 8, ED  Result Value Ref Range   Sodium 142 135 - 145 mmol/L   Potassium 4.3 3.5 - 5.1 mmol/L   Chloride 105 101 - 111 mmol/L   BUN 17 6 - 20 mg/dL   Creatinine, Ser 1.470.90 0.44 - 1.00 mg/dL   Glucose, Bld 829110 (H) 65 - 99 mg/dL   Calcium, Ion 5.621.23 1.301.13 - 1.30 mmol/L   TCO2 27 0 - 100 mmol/L   Hemoglobin 15.3 (H) 12.0 - 15.0 g/dL   HCT 86.545.0 78.436.0 - 69.646.0 %   Ct Renal Stone Study 01/29/2015  CLINICAL DATA:   Patient with urinary frequency since 1900 on 01/28/15; also c/o foul smelling urine; ORIF LT hip. EXAM: CT ABDOMEN AND PELVIS WITHOUT CONTRAST TECHNIQUE: Multidetector CT imaging of the abdomen and pelvis was performed following the standard protocol without IV contrast. COMPARISON:  None. FINDINGS: Coarse subpleural scarring in the visualized lung bases. Heavy mitral annulus calcification. Scattered coronary calcifications. 3 cm peripherally calcified gallstone in the dependent aspect of the gallbladder which is physiologically distended. No surrounding inflammatory soft/edematous changes. No intrahepatic biliary duct dilatation is evident. Unremarkable liver, spleen, adrenal glands, kidneys, pancreas. No nephrolithiasis or hydronephrosis. Ureters decompressed. Unenhanced CT was performed per clinician order. Lack of IV contrast limits sensitivity and specificity, especially for evaluation of abdominal/pelvic solid viscera. Small hiatal hernia. Stomach is decompressed. Small bowel decompressed. Normal appendix. Scattered diverticula from the ascending colon. There is segmental fecal distention of the splenic flexure of the colon, and moderate fecal material in the descending colon and sigmoid which are nondilated. There is fecal distention of the rectum. No significant colonic wall thickening  or adjacent inflammatory/edematous changes. Urinary bladder physiologically distended. Patchy aortoiliac arterial calcifications without aneurysm. Bilateral pelvic phleboliths. Fixation hardware across a comminuted left femoral neck fracture, incompletely visualized. However, the component which previously projected centrally in the femoral head now projects at its lateral margin beyond the cortex of the femoral head, with adjacent lucency suggesting migration/loosening/infection. No ascites.  No free air.  No adenopathy. Mild spondylitic changes throughout the visualized lower thoracic and lumbar spine. IMPRESSION: 1. No  nephrolithiasis or hydronephrosis. 2. Fecal dilatation of the splenic flexure of the colon and rectum without evidence of obstruction. 3. Malposition of left femoral neck fixation hardware as detailed above. 4. Cholelithiasis 5. Atherosclerosis, including aortoiliac and coronary artery disease. Please note that although the presence of coronary artery calcium documents the presence of coronary artery disease, the severity of this disease and any potential stenosis cannot be assessed on this non-gated CT examination. Assessment for potential risk factor modification, dietary therapy or pharmacologic therapy may be warranted, if clinically indicated. Electronically Signed   By: Corlis Leak M.D.   On: 01/29/2015 08:32      0745:  In the ED, pt stated she felt like she had to urinate, as well as have a BM: PVR 60ml.   0900:  Pt has had a very large, hard BM while in the ED. States she "feels better now" and wants to go home. No clear UTI on Udip; UC is pending. Dx and testing d/w pt.  Questions answered.  Verb understanding, agreeable to d/c home with outpt f/u.   Samuel Jester, DO 02/01/15 1401

## 2015-01-29 NOTE — ED Notes (Addendum)
Pt c/o urinary frequency since 7pm last night. Pt states she feels like has to urinate every 30 mins.  Denies dysuria but did notice an odor to her urine around 7pm last night. *Pt is due for an operation for a fracture in her left hip. Pt saw Dr. Hilda LiasKeeling this past Thursday and will see him again in 2 weeks.*

## 2015-01-30 ENCOUNTER — Other Ambulatory Visit: Payer: Self-pay | Admitting: Orthopaedic Surgery

## 2015-01-30 DIAGNOSIS — Z9889 Other specified postprocedural states: Secondary | ICD-10-CM

## 2015-01-31 LAB — URINE CULTURE

## 2015-02-10 NOTE — Patient Instructions (Signed)
Andrea [name]  02/10/2015     @PREFPERIOPPHARMACY @   Your procedure is scheduled on  02/16/2015   [facility] to [facility] at  615  A.M.  Call this number if you have problems the morning of surgery:  [phone]   Remember:  Do not eat food or drink liquids after midnight.  Take these medicines the morning of surgery with A SIP OF WATER  Cardura, synthroid, oxycodone.   Do not wear jewelry, make-up or nail polish.  Do not wear lotions, powders, or perfumes.  You may wear deodorant.  Do not shave 48 hours prior to surgery.  Men may shave face and neck.  Do not bring valuables to the hospital.  [facility] is not responsible for any belongings or valuables.  Contacts, dentures or bridgework may not be worn into surgery.  Leave your suitcase in the car.  After surgery it may be brought to your room.  For patients admitted to the hospital, discharge time will be determined by your treatment team.  Patients discharged the day of surgery will not be allowed to drive home.   Name and phone number of your driver:   family Special instructions:  none  Please read over the following fact sheets that you were given. Pain Booklet, Coughing and Deep Breathing, Surgical Site Infection Prevention, Anesthesia Post-op Instructions and Care and Recovery After Surgery      Incision Care An incision is when a surgeon cuts into your body. After surgery, the incision needs to be cared for properly to prevent infection.  HOW TO CARE FOR YOUR INCISION  Take medicines only as directed by your health care provider.  There are many different ways to close and cover an incision, including stitches, skin glue, and adhesive strips. Follow your health care provider's instructions on:  Incision care.  Bandage (dressing) changes and removal.  Incision closure removal.  Do not take baths, swim, or use a hot tub until your health care provider approves. You may shower as directed by your  health care provider.  Resume your normal diet and activities as directed.  Use anti-itch medicine (such as an antihistamine) as directed by your health care provider. The incision may itch while it is healing. Do not pick or scratch at the incision.  Drink enough fluid to keep your urine clear or pale yellow. SEEK MEDICAL CARE IF:   You have drainage, redness, swelling, or pain at your incision site.  You have muscle aches, chills, or a general ill feeling.  You notice a bad smell coming from the incision or dressing.  Your incision edges separate after the sutures, staples, or skin adhesive strips have been removed.  You have persistent nausea or vomiting.  You have a fever.  You are dizzy. SEEK IMMEDIATE MEDICAL CARE IF:   You have a rash.  You faint.  You have difficulty breathing. MAKE SURE YOU:   Understand these instructions.  Will watch your condition.  Will get help right away if you are not doing well or get worse.   This information is not intended to replace advice given to you by your health care provider. Make sure you discuss any questions you have with your health care provider.   Document Released: 10/05/2004 Document Revised: 04/08/2014 Document Reviewed: 05/12/2013 Elsevier Interactive Patient Education 2016 [employer] PATIENT INSTRUCTIONS POST-ANESTHESIA  IMMEDIATELY FOLLOWING SURGERY:  Do not drive or operate machinery for the first twenty four hours after surgery.  Do not make any important decisions for twenty four hours after surgery or while taking narcotic pain medications or sedatives.  If you develop intractable nausea and vomiting or a severe headache please notify your doctor immediately.  FOLLOW-UP:  Please make an appointment with your surgeon as instructed. You do not need to follow up with anesthesia unless specifically instructed to do so.  WOUND CARE INSTRUCTIONS (if applicable):  Keep a dry clean dressing on the  anesthesia/puncture wound site if there is drainage.  Once the wound has quit draining you may leave it open to air.  Generally you should leave the bandage intact for twenty four hours unless there is drainage.  If the epidural site drains for more than 36-48 hours please call the anesthesia department.  QUESTIONS?:  Please feel free to call your physician or the hospital operator if you have any questions, and they will be happy to assist you.

## 2015-02-13 ENCOUNTER — Encounter (HOSPITAL_COMMUNITY): Payer: Self-pay

## 2015-02-13 ENCOUNTER — Other Ambulatory Visit (HOSPITAL_COMMUNITY): Payer: Self-pay | Admitting: Orthopaedic Surgery

## 2015-02-13 ENCOUNTER — Encounter (HOSPITAL_COMMUNITY)
Admission: RE | Admit: 2015-02-13 | Discharge: 2015-02-13 | Disposition: A | Discharge status: Home / Self Care | Payer: Medicare Other | Source: Ambulatory Visit | Attending: Orthopaedic Surgery | Admitting: Orthopaedic Surgery

## 2015-02-13 VITALS — Wt 150.0 lb

## 2015-02-13 DIAGNOSIS — Z9889 Other specified postprocedural states: Secondary | ICD-10-CM

## 2015-02-13 DIAGNOSIS — M25552 Pain in left hip: Secondary | ICD-10-CM | POA: Diagnosis present

## 2015-02-13 DIAGNOSIS — S728X2K Other fracture of left femur, subsequent encounter for closed fracture with nonunion: Secondary | ICD-10-CM | POA: Diagnosis not present

## 2015-02-13 DIAGNOSIS — M11252 Other chondrocalcinosis, left hip: Secondary | ICD-10-CM | POA: Diagnosis not present

## 2015-02-13 DIAGNOSIS — Z472 Encounter for removal of internal fixation device: Secondary | ICD-10-CM | POA: Diagnosis not present

## 2015-02-13 DIAGNOSIS — M89552 Osteolysis, left thigh: Secondary | ICD-10-CM | POA: Diagnosis not present

## 2015-02-13 DIAGNOSIS — T148XXA Other injury of unspecified body region, initial encounter: Secondary | ICD-10-CM

## 2015-02-13 DIAGNOSIS — Z01812 Encounter for preprocedural laboratory examination: Secondary | ICD-10-CM | POA: Diagnosis not present

## 2015-02-13 LAB — TYPE AND SCREEN
ABO/RH(D): O NEG
ANTIBODY SCREEN: NEGATIVE

## 2015-02-13 LAB — CBC WITH DIFFERENTIAL/PLATELET
BASOS PCT: 0 %
Basophils Absolute: 0 10*3/uL (ref 0.0–0.1)
EOS ABS: 0 10*3/uL (ref 0.0–0.7)
EOS PCT: 0 %
HCT: 42.9 % (ref 36.0–46.0)
HEMOGLOBIN: 14.1 g/dL (ref 12.0–15.0)
LYMPHS ABS: 1.6 10*3/uL (ref 0.7–4.0)
Lymphocytes Relative: 20 %
MCH: 30.2 pg (ref 26.0–34.0)
MCHC: 32.9 g/dL (ref 30.0–36.0)
MCV: 91.9 fL (ref 78.0–100.0)
MONO ABS: 0.5 10*3/uL (ref 0.1–1.0)
MONOS PCT: 6 %
Neutro Abs: 6.1 10*3/uL (ref 1.7–7.7)
Neutrophils Relative %: 74 %
PLATELETS: 400 10*3/uL (ref 150–400)
RBC: 4.67 MIL/uL (ref 3.87–5.11)
RDW: 14.8 % (ref 11.5–15.5)
WBC: 8.1 10*3/uL (ref 4.0–10.5)

## 2015-02-13 LAB — COMPREHENSIVE METABOLIC PANEL
ALK PHOS: 67 U/L (ref 38–126)
ALT: 8 U/L — ABNORMAL LOW (ref 14–54)
ANION GAP: 8 (ref 5–15)
AST: 22 U/L (ref 15–41)
Albumin: 3.9 g/dL (ref 3.5–5.0)
BUN: 23 mg/dL — ABNORMAL HIGH (ref 6–20)
CALCIUM: 10.3 mg/dL (ref 8.9–10.3)
CO2: 26 mmol/L (ref 22–32)
Chloride: 108 mmol/L (ref 101–111)
Creatinine, Ser: 1.05 mg/dL — ABNORMAL HIGH (ref 0.44–1.00)
GFR calc non Af Amer: 46 mL/min — ABNORMAL LOW (ref >60)
GFR, EST AFRICAN AMERICAN: 54 mL/min — AB (ref >60)
Glucose, Bld: 100 mg/dL — ABNORMAL HIGH (ref 65–99)
POTASSIUM: 4.7 mmol/L (ref 3.5–5.1)
SODIUM: 142 mmol/L (ref 135–145)
Total Bilirubin: 0.8 mg/dL (ref 0.3–1.2)
Total Protein: 6.5 g/dL (ref 6.5–8.1)

## 2015-02-14 ENCOUNTER — Ambulatory Visit (HOSPITAL_COMMUNITY)
Admission: RE | Admit: 2015-02-14 | Discharge: 2015-02-14 | Disposition: A | Discharge status: Home / Self Care | Payer: Medicare Other | Source: Ambulatory Visit | Attending: Orthopaedic Surgery | Admitting: Orthopaedic Surgery

## 2015-02-14 ENCOUNTER — Encounter (HOSPITAL_COMMUNITY): Payer: Self-pay | Admitting: Anesthesiology

## 2015-02-14 DIAGNOSIS — Z472 Encounter for removal of internal fixation device: Secondary | ICD-10-CM | POA: Insufficient documentation

## 2015-02-14 DIAGNOSIS — M25552 Pain in left hip: Secondary | ICD-10-CM | POA: Diagnosis not present

## 2015-02-14 DIAGNOSIS — Z01812 Encounter for preprocedural laboratory examination: Secondary | ICD-10-CM | POA: Insufficient documentation

## 2015-02-14 DIAGNOSIS — S728X2K Other fracture of left femur, subsequent encounter for closed fracture with nonunion: Secondary | ICD-10-CM | POA: Insufficient documentation

## 2015-02-14 DIAGNOSIS — T148XXA Other injury of unspecified body region, initial encounter: Secondary | ICD-10-CM

## 2015-02-14 DIAGNOSIS — M11252 Other chondrocalcinosis, left hip: Secondary | ICD-10-CM | POA: Insufficient documentation

## 2015-02-14 DIAGNOSIS — M89552 Osteolysis, left thigh: Secondary | ICD-10-CM | POA: Insufficient documentation

## 2015-02-16 ENCOUNTER — Encounter (HOSPITAL_COMMUNITY): Admission: RE | Payer: Self-pay | Source: Ambulatory Visit

## 2015-02-16 ENCOUNTER — Ambulatory Visit (HOSPITAL_COMMUNITY): Admission: RE | Admit: 2015-02-16 | Payer: Medicare Other | Source: Ambulatory Visit | Admitting: Orthopaedic Surgery

## 2015-02-16 SURGERY — REMOVAL, HARDWARE
Anesthesia: Choice | Laterality: Left

## 2015-03-14 DIAGNOSIS — Z96649 Presence of unspecified artificial hip joint: Secondary | ICD-10-CM | POA: Insufficient documentation

## 2015-05-02 ENCOUNTER — Ambulatory Visit (HOSPITAL_COMMUNITY): Payer: Medicare Other | Attending: Student | Admitting: Physical Therapy

## 2015-05-02 DIAGNOSIS — R29898 Other symptoms and signs involving the musculoskeletal system: Secondary | ICD-10-CM | POA: Diagnosis present

## 2015-05-02 DIAGNOSIS — R2689 Other abnormalities of gait and mobility: Secondary | ICD-10-CM | POA: Insufficient documentation

## 2015-05-02 DIAGNOSIS — Z96642 Presence of left artificial hip joint: Secondary | ICD-10-CM | POA: Diagnosis present

## 2015-05-02 DIAGNOSIS — R269 Unspecified abnormalities of gait and mobility: Secondary | ICD-10-CM | POA: Diagnosis not present

## 2015-05-02 NOTE — Therapy (Signed)
[facility] [address] [place], [place], [address] Phone: [phone]   Fax:  [phone]  Physical Therapy Evaluation  Patient Details  Name: Andrea [name] MRN: [id number] Date of Birth: July 02, 1925 Referring Provider: Levonne Lapping  Encounter Date: 05/02/2015      PT End of Session - 05/02/15 1608    Visit Number 1   Number of Visits 12   Date for PT Re-Evaluation 06/01/15   Authorization Type medicare/ medicaid   Authorization - Visit Number 1   Authorization - Number of Visits 12   PT Start Time 1500   PT Stop Time 1600   PT Time Calculation (min) 60 min   Equipment Utilized During Treatment Gait belt   Activity Tolerance Patient tolerated treatment well   Behavior During Therapy [facility] for tasks assessed/performed      Past Medical History  Diagnosis Date  . Thyroid disease     Past Surgical History  Procedure Laterality Date  . Abdominal hysterectomy    . Orif hip fracture Left 09/02/2014    Procedure: OPEN REDUCTION INTERNAL FIXATION LEFT HIP FRACTURE;  Surgeon: Darreld Mclean, MD;  Location: [facility];  Service: Orthopedics;  Laterality: Left;    There were no vitals filed for this visit.  Visit Diagnosis:  Abnormality of gait - Plan: PT plan of care cert/re-cert  History of hemiarthroplasty of left hip - Plan: PT plan of care cert/re-cert  Weakness of left lower extremity - Plan: PT plan of care cert/re-cert  Poor balance - Plan: PT plan of care cert/re-cert      Subjective Assessment - 05/02/15 1619    Subjective Andrea [name] states that she fell in her garden and fractured her hip.  She had an ORIF on 09/03/2014.  She was discharged to a SNF for a month and was discharged even though she knew that there was something wrong.  She went home and continued to go down hill.  She ended up being referred to a different orthopedic surgeon who completed a Lt hip revision with hemiarthroplasty on 02/28/2015   She states since  the second surgery she has done well but she would like to get off the walker.  Prior to the fracture she was ambulating without an assistive device.     How long can you sit comfortably? no problem   How long can you stand comfortably? she is unable to stand much on her Lt foot due to swelling and discomfort.    How long can you walk comfortably? Waler:  15 minutes; Cane: not walking be herself    Patient Stated Goals to get off the walker and to be independent again;  get outdoors ; to be able to sleep    Currently in Pain? No/denies            [facility] PT Assessment - 05/02/15 0001    Assessment   Medical Diagnosis LT total hip replacement   Referring Provider Andrea [name]   Onset Date/Surgical Date 02/28/15   Hand Dominance Right   Next MD Visit 06/12/2015   Prior Therapy HH   Precautions   Precautions Posterior Hip   Precaution Comments Pt states precautions werer for 6 weeks which it has been longer    Restrictions   Weight Bearing Restrictions No   Balance Screen   Has the patient fallen in the past 6 months Yes   How many times? 1   Has the patient had a decrease in activity level  because of a fear of falling?  Yes   Is the patient reluctant to leave their home because of a fear of falling?  No   Home Environment   Living Environment Private residence   Living Arrangements Children   Available Help at Discharge Family   Type of Home Mobile home   Home Access Ramped entrance   Prior Function   Level of Independence Independent   Vocation Retired   Leisure fish, garden;crochet    Cognition   Overall Cognitive Status Within Functional Limits for tasks assessed   Observation/Other Assessments   Focus on Therapeutic Outcomes (FOTO)  46   Functional Tests   Functional tests Single leg stance;Sit to Stand   Single Leg Stance   Comments Rt:  30 seconds; Lt 0 seconds    Sit to Stand   Comments 5 times in 28.58 seconds    ROM / Strength   AROM / PROM / Strength  Strength   AROM   AROM Assessment Site Knee;Ankle   Right/Left Knee Right;Left   Right Knee Extension 20   Left Knee Extension 18   Right/Left Ankle Right;Left   Right Ankle Dorsiflexion 3   Right Ankle Plantar Flexion 28   Left Ankle Dorsiflexion 2   Left Ankle Plantar Flexion 20   Strength   Strength Assessment Site Hip;Knee;Ankle   Right/Left Hip Right;Left   Right Hip Flexion 5/5   Right Hip Extension 3-/5   Right Hip ABduction 5/5   Left Hip Flexion 5/5   Left Hip Extension 3/5   Left Hip ABduction 3/5   Right/Left Knee Right;Left   Right Knee Flexion 5/5   Right Knee Extension 5/5   Left Knee Flexion 5/5   Left Knee Extension 5/5   Right/Left Ankle Right;Left   Right Ankle Dorsiflexion 4/5   Left Ankle Dorsiflexion 4+/5   6 Minute Walk- Baseline   6 Minute Walk- Baseline --  with single point cane ambulates 349ft. in 6 minutes    Functional Gait  Assessment   Gait assessed  Yes  forward flexed, decreased knee extension, ankle dorsiflexion                   [facility] Adult PT Treatment/Exercise - 05/02/15 0001    Exercises   Exercises Knee/Hip   Knee/Hip Exercises: Stretches   Active Hamstring Stretch 3 reps;30 seconds   Knee/Hip Exercises: Standing   Other Standing Knee Exercises heel raise/toe raise x 10    Knee/Hip Exercises: Supine   Quad Sets Strengthening;Both;10 reps   Bridges 10 reps   Knee/Hip Exercises: Sidelying   Hip ABduction Strengthening;10 reps                PT Education - 05/02/15 1545    Education provided Yes   Education Details HEP   Person(s) Educated Patient   Methods Explanation;Handout;Verbal cues   Comprehension Verbalized understanding;Returned demonstration          PT Short Term Goals - 05/02/15 1556    PT SHORT TERM GOAL #1   Title Pt to be ambulating with a cane to be able to complete light housework, get a cup of coffee    Time 3   Period Weeks   PT SHORT TERM GOAL #2   Title Pt ankle ROM to be  improved 5 degrees dorsiflexion; knee extension to be less than 15 for more normalized gait.   Time 3   Period Weeks   PT SHORT TERM GOAL #3  Title Pt strength to be increased by 1/2 grade to allow pt to be able to come from sit to stand without difficulty.    Time 3   Period Weeks   PT SHORT TERM GOAL #4   Title Pt to be able to single leg stance on Lt for 15 seconds for decrease fall risk and to progress to walking with no assistive device to return to prior functional level.    Time 3   Period Weeks   PT SHORT TERM GOAL #5   Title Pt to be able to sleep for 5 hours in a row for improved health benefit    Time 3   Period Weeks           PT Long Term Goals - 05-04-15 1601    PT LONG TERM GOAL #1   Title Pt to be able to walk without an assistive device to be able to carry items in both hands in her home.   Time 6   Period Weeks   PT LONG TERM GOAL #2   Title Pt sterngth at least a 4+/5 in order to be able to work in the yard, and to get  up from a squatted position.    Time 6   Period Weeks   PT LONG TERM GOAL #3   Title Pt single leg stance for 30seconds to be a reduced fall risk to be able to walk without an assistive device on uneven ground with ease.   Time 6   Period Weeks   PT LONG TERM GOAL #4   Title Pt to have increased dorsiflexion and knee extension with equal stride length at a rate of 1000 feet in 6 minutes gait test in order feel confident to go fishing.                 Plan - 04-May-2015 1611    Clinical Impression Statement Andrea [name] is an 82 yo female who fell and fx her hip in June of 2016.  She had a compression screw failure and underwent a Lt hip revision with hemiarthroplasty o 02/28/2015.  She is now being referred to outpatient physical therapy to return her to her prior functional level of ambualting without an assisitve device, fishing and gardening.  She will benefit from skilled therapy to address her decreased strength, decreased balance,  decreased ROM, and abnormal gait.     Pt will benefit from skilled therapeutic intervention in order to improve on the following deficits Abnormal gait;Decreased activity tolerance;Decreased balance;Decreased endurance;Difficulty walking;Decreased strength;Postural dysfunction   Rehab Potential Good   PT Frequency 2x / week   PT Duration 6 weeks   PT Treatment/Interventions Patient/family education;Gait training;Stair training;Functional mobility training;Therapeutic activities;Therapeutic exercise;Balance training   PT Next Visit Plan work on increasing ankle dorsiflexion and plantarflexion with gait, increasing velocity of gait with single prong cane, SLS, sit to stand, side step and rockerboard.  Progress to lunges and ambulation without assisitve device.    PT Home Exercise Plan given   Consulted and Agree with Plan of Care Patient          G-Codes - 05-04-2015 1623    Functional Assessment Tool Used foto walking    Functional Limitation Mobility: Walking and moving around   Mobility: Walking and Moving Around Current Status [phone]) At least 40 percent but less than 60 percent impaired, limited or restricted   Mobility: Walking and Moving Around Goal Status (U0454) At least 40 percent but less than  60 percent impaired, limited or restricted       Problem List Patient Active Problem List   Diagnosis Date Noted  . Intertrochanteric fracture of left hip (HCC) 09/01/2014  . Hypothyroidism 09/01/2014  . Hypertension 09/01/2014  . Closed left hip fracture [facility]) 09/01/2014    Andrea [name], PT CLT [phone] 05/02/2015, 4:29 PM  [facility] [address] [place], [place], [address] Phone: [phone]   Fax:  [phone]  Name: Neka Bise MRN: [id number] Date of Birth: 06-Mar-1926

## 2015-05-02 NOTE — Patient Instructions (Signed)
Toe / Heel Raise (Sitting)    Sitting, raise heels, then rock back on heels and raise toes. Repeat __10__ times. Four times a day  Copyright  VHI. All rights reserved.  Strengthening: Quadriceps Set    Tighten muscles on top of thighs by pushing knees down into surface. Hold __5__ seconds. Repeat __10__ times per set. Do __1__ sets per session. Do ___4_ sessions per day.  http://orth.exer.us/602   Copyright  VHI. All rights reserved.  Stretching: Hamstring (Supine)    Supporting right thigh behind knee, slowly straighten knee until stretch is felt in back of thigh. Hold __20__ seconds. Repeat __3__ times per set. Do __1__ sets per session. Do ___2_ sessions per day.  http://orth.exer.us/656   Copyright  VHI. All rights reserved.  Bridging    Slowly raise buttocks from floor, keeping stomach tight. Repeat _10___ times per set. Do _1___ sets per session. Do _2___ sessions per day.  http://orth.exer.us/1096   Copyright  VHI. All rights reserved.  Strengthening: Hip Abduction (Side-Lying)    Tighten muscles on front of left thigh, then lift leg _15___ inches from surface, keeping knee locked.  Repeat _10___ times per set. Do _1___ sets per session. Do __2__ sessions per day.  http://orth.exer.us/622   Copyright  VHI. All rights reserved.

## 2015-05-17 ENCOUNTER — Telehealth (HOSPITAL_COMMUNITY): Payer: Self-pay

## 2015-05-17 ENCOUNTER — Ambulatory Visit (HOSPITAL_COMMUNITY): Payer: Medicare Other

## 2015-05-17 NOTE — Telephone Encounter (Signed)
She has Bladder infection and doesn't feel like coming in today.

## 2015-05-18 ENCOUNTER — Ambulatory Visit (HOSPITAL_COMMUNITY): Payer: Medicare Other | Attending: Student

## 2015-05-18 DIAGNOSIS — Z96642 Presence of left artificial hip joint: Secondary | ICD-10-CM | POA: Insufficient documentation

## 2015-05-18 DIAGNOSIS — R29898 Other symptoms and signs involving the musculoskeletal system: Secondary | ICD-10-CM | POA: Diagnosis present

## 2015-05-18 DIAGNOSIS — R269 Unspecified abnormalities of gait and mobility: Secondary | ICD-10-CM | POA: Diagnosis present

## 2015-05-18 DIAGNOSIS — R2689 Other abnormalities of gait and mobility: Secondary | ICD-10-CM

## 2015-05-18 NOTE — Therapy (Signed)
[place] [facility] [address] [place], [place], [address] Phone: [phone]   Fax:  [phone]  Physical Therapy Treatment  Patient Details  Name: Andrea [name] MRN: [id number] Date of Birth: 1926/02/17 Referring Provider: Levonne Lapping  Encounter Date: 05/18/2015      PT End of Session - 05/18/15 1649    Visit Number 2   Number of Visits 12   Date for PT Re-Evaluation 06/01/15   Authorization Type medicare/ medicaid   Authorization - Visit Number 2   Authorization - Number of Visits 12   PT Start Time 1648   PT Stop Time 1735   PT Time Calculation (min) 47 min   Equipment Utilized During Treatment Gait belt   Activity Tolerance Patient tolerated treatment well   Behavior During Therapy [facility] for tasks assessed/performed      Past Medical History  Diagnosis Date  . Thyroid disease     Past Surgical History  Procedure Laterality Date  . Abdominal hysterectomy    . Orif hip fracture Left 09/02/2014    Procedure: OPEN REDUCTION INTERNAL FIXATION LEFT HIP FRACTURE;  Surgeon: Darreld Mclean, MD;  Location: [facility];  Service: Orthopedics;  Laterality: Left;    There were no vitals filed for this visit.  Visit Diagnosis:  Abnormality of gait  History of hemiarthroplasty of left hip  Weakness of left lower extremity  Poor balance      Subjective Assessment - 05/18/15 1642    Subjective Pt stated hip is feeling good today, today her arthritis is bother her today.  Feels stiff BLE today.  Reports compliance with HEP with no questions concerning.   Patient Stated Goals to get off the walker and to be independent again;  get outdoors ; to be able to sleep    Currently in Pain? No/denies                [facility] Adult PT Treatment/Exercise - 05/18/15 0001    Knee/Hip Exercises: Stretches   Active Hamstring Stretch 3 reps;30 seconds   Active Hamstring Stretch Limitations supine with rope   Gastroc Stretch 3 reps;30 seconds    Gastroc Stretch Limitations slant board   Knee/Hip Exercises: Standing   Heel Raises Both;10 reps   Heel Raises Limitations Toe raises 10x   Rocker Board 2 minutes  R/L and A/P 2 HHA   SLS Rt 12", Lt 4" max of 5   Gait Training Gait training with SPC, cueing for heel to toe pattern and minimal cueing for proper sequence 3-point then 2-point   Other Standing Knee Exercises side stepping no resistance this session 1RT, cueing to reduce ER   Knee/Hip Exercises: Seated   Sit to Sand 10 reps;without UE support   Knee/Hip Exercises: Supine   Quad Sets Strengthening;Both;10 reps   Short Arc Quad Sets 10 reps   Bridges 10 reps   Knee/Hip Exercises: Sidelying   Hip ABduction Strengthening;10 reps              PT Short Term Goals - 05/02/15 1556    PT SHORT TERM GOAL #1   Title Pt to be ambulating with a cane to be able to complete light housework, get a cup of coffee    Time 3   Period Weeks   PT SHORT TERM GOAL #2   Title Pt ankle ROM to be improved 5 degrees dorsiflexion; knee extension to be less than 15 for more normalized gait.   Time 3  Period Weeks   PT SHORT TERM GOAL #3   Title Pt strength to be increased by 1/2 grade to allow pt to be able to come from sit to stand without difficulty.    Time 3   Period Weeks   PT SHORT TERM GOAL #4   Title Pt to be able to single leg stance on Lt for 15 seconds for decrease fall risk and to progress to walking with no assistive device to return to prior functional level.    Time 3   Period Weeks   PT SHORT TERM GOAL #5   Title Pt to be able to sleep for 5 hours in a row for improved health benefit    Time 3   Period Weeks           PT Long Term Goals - 05/02/15 1601    PT LONG TERM GOAL #1   Title Pt to be able to walk without an assistive device to be able to carry items in both hands in her home.   Time 6   Period Weeks   PT LONG TERM GOAL #2   Title Pt sterngth at least a 4+/5 in order to be able to work in the yard,  and to get  up from a squatted position.    Time 6   Period Weeks   PT LONG TERM GOAL #3   Title Pt single leg stance for 30seconds to be a reduced fall risk to be able to walk without an assistive device on uneven ground with ease.   Time 6   Period Weeks   PT LONG TERM GOAL #4   Title Pt to have increased dorsiflexion and knee extension with equal stride length at a rate of 1000 feet in 6 minutes gait test in order feel confident to go fishing.                 Plan - 05/18/15 1722    Clinical Impression Statement Reviewed goals, compliance and techiques with HEP and copy of evaluation given to pt.  Session focus on functional stretches to improve ankle ROM and knee extension with gait mechanincs, balance training and functional strengthening activities.  Pt able to demonstrate appropraite 3-point then 2-point sequence with SPC with min cueing for heel to toe and to increase cadence with gait.  Began rockerboard to improve weight distrubtion and balance with cueing for technique and posture.  Min A required with balance activities to reduce risk of falls.  No reports of increased pain through session.   PT Next Visit Plan Continue wiht current PT POC to improve ankle dorsiflexion and plantarflexion with gait, increasing velocity of gait with single prong cane, SLS, sit to stand, side step and rockerboard.  Progress to lunges and ambulation without assisitve device.         Problem List Patient Active Problem List   Diagnosis Date Noted  . Intertrochanteric fracture of left hip (HCC) 09/01/2014  . Hypothyroidism 09/01/2014  . Hypertension 09/01/2014  . Closed left hip fracture [facility]) 09/01/2014   Becky Sax, LPTA; CBIS [phone]  Juel Burrow 05/18/2015, 6:36 PM   [facility] [address] [place], [place], [address] Phone: [phone]   Fax:  [phone]  Name: Andrea [name] MRN: [id number] Date of Birth:  06/30/1925

## 2015-05-23 ENCOUNTER — Ambulatory Visit (HOSPITAL_COMMUNITY): Payer: Medicare Other | Admitting: Physical Therapy

## 2015-05-23 DIAGNOSIS — Z96642 Presence of left artificial hip joint: Secondary | ICD-10-CM

## 2015-05-23 DIAGNOSIS — R269 Unspecified abnormalities of gait and mobility: Secondary | ICD-10-CM | POA: Diagnosis not present

## 2015-05-23 DIAGNOSIS — R29898 Other symptoms and signs involving the musculoskeletal system: Secondary | ICD-10-CM

## 2015-05-23 DIAGNOSIS — R2689 Other abnormalities of gait and mobility: Secondary | ICD-10-CM

## 2015-05-23 NOTE — Therapy (Addendum)
[place], [place], [address] Phone: [phone]   Fax:  [phone]  Physical Therapy Treatment  Patient Details  Name: Andrea [name] MRN: [id number] Date of Birth: 1926-01-15 Referring Provider: Vela Prose  Encounter Date: 05/23/2015      PT End of Session - 05/23/15 1603    Visit Number 3   Number of Visits 12   Date for PT Re-Evaluation 06/01/15   Authorization Type medicare/ medicaid   Authorization - Visit Number 3   Authorization - Number of Visits 12   PT Start Time 4742   PT Stop Time 1518   PT Time Calculation (min) 40 min   Equipment Utilized During Treatment Gait belt   Activity Tolerance Patient tolerated treatment well   Behavior During Therapy [facility] for tasks assessed/performed      Past Medical History  Diagnosis Date  . Thyroid disease     Past Surgical History  Procedure Laterality Date  . Abdominal hysterectomy    . Orif hip fracture Left 09/02/2014    Procedure: OPEN REDUCTION INTERNAL FIXATION LEFT HIP FRACTURE;  Surgeon: Sanjuana Kava, MD;  Location: [facility];  Service: Orthopedics;  Laterality: Left;    There were no vitals filed for this visit.  Visit Diagnosis:  Abnormality of gait  History of hemiarthroplasty of left hip  Weakness of left lower extremity  Poor balance      Subjective Assessment - 05/23/15 1447    Subjective Pt states arthritis is flared up bad in her LE's and request to do all exercises non weight bearing today.  STates she has inflammatory arthritis and it comes and goes.  PT states her Lt hip does not hurt.   Currently in Pain? No/denies                         [facility] Adult PT Treatment/Exercise - 05/23/15 1449    Knee/Hip Exercises: Stretches   Active Hamstring Stretch 3 reps;30 seconds   Active Hamstring Stretch Limitations supine with rope   Knee/Hip Exercises: Seated   Long Arc Quad Both;15 reps   Knee/Hip Exercises: Supine    Quad Sets Strengthening;Both;10 reps   Short Arc [employer] Both;15 reps   Bridges 15 reps   Straight Leg Raises Both;10 reps   Knee/Hip Exercises: Sidelying   Hip ABduction Strengthening;Left;15 reps                  PT Short Term Goals - 05/02/15 1556    PT SHORT TERM GOAL #1   Title Pt to be ambulating with a cane to be able to complete light housework, get a cup of coffee    Time 3   Period Weeks   PT SHORT TERM GOAL #2   Title Pt ankle ROM to be improved 5 degrees dorsiflexion; knee extension to be less than 15 for more normalized gait.   Time 3   Period Weeks   PT SHORT TERM GOAL #3   Title Pt strength to be increased by 1/2 grade to allow pt to be able to come from sit to stand without difficulty.    Time 3   Period Weeks   PT SHORT TERM GOAL #4   Title Pt to be able to single leg stance on Lt for 15 seconds for decrease fall risk and to progress to walking with no assistive device to return to prior functional level.    Time 3  Period Weeks   PT SHORT TERM GOAL #5   Title Pt to be able to sleep for 5 hours in a row for improved health benefit    Time 3   Period Weeks           PT Long Term Goals - 05/02/15 1601    PT LONG TERM GOAL #1   Title Pt to be able to walk without an assistive device to be able to carry items in both hands in her home.   Time 6   Period Weeks   PT LONG TERM GOAL #2   Title Pt sterngth at least a 4+/5 in order to be able to work in the yard, and to get  up from a squatted position.    Time 6   Period Weeks   PT LONG TERM GOAL #3   Title Pt single leg stance for 30seconds to be a reduced fall risk to be able to walk without an assistive device on uneven ground with ease.   Time 6   Period Weeks   PT LONG TERM GOAL #4   Title Pt to have increased dorsiflexion and knee extension with equal stride length at a rate of 1000 feet in 6 minutes gait test in order feel confident to go fishing.                 Plan -  05/23/15 1604    Clinical Impression Statement Completed all non-weight bearing therex this session due to arthritis flare up.  Pt able to complete with minimal cues, howeer noted difficulty and weakness noted in quads with inablity to complete SLR without extension lag.  Pt requried cues to complete therex controlled and slowly for optimal benefit.  Added SLR and LAQ to POC today to target weak quad muscles.    PT Next Visit Plan Resume standing exericses next session if pain in LE's decreased.  Progress increased velocity ambulation wtih cane, SLS, sit to stand, and vector stance.  Progress to lunges and ambulation without assisitve device.       [id number] CK G8980 CK  Problem List Patient Active Problem List   Diagnosis Date Noted  . Intertrochanteric fracture of left hip ([place]) 09/01/2014  . Hypothyroidism 09/01/2014  . Hypertension 09/01/2014  . Closed left hip fracture ([place]) 09/01/2014    Teena Irani, PTA/CLT [phone]  05/23/2015, 4:08 PM  [facility] [address] [place], [place], [address] Phone: [phone]   Fax:  [phone]  Name: Cyniah Gossard MRN: [id number] Date of Birth: 07/02/1925  PHYSICAL THERAPY DISCHARGE SUMMARY  Visits from Start of Care: 3  Current functional level related to goals / functional outcomes: PT completing HEP   Remaining deficits: Unknown as pt did not return for future visists    Education / Equipment: HEP  Plan: Patient agrees to discharge.  Patient goals were partially met. Patient is being discharged due to being pleased with the current functional level.  ?????       Rayetta Humphrey, [place] CLT [phone]

## 2015-05-25 ENCOUNTER — Encounter (HOSPITAL_COMMUNITY): Payer: Medicare Other | Admitting: Physical Therapy

## 2015-05-25 ENCOUNTER — Telehealth (HOSPITAL_COMMUNITY): Payer: Self-pay | Admitting: Physical Therapy

## 2015-05-25 NOTE — Telephone Encounter (Signed)
D/C She is doing well and will continue her exercise at home.

## 2015-05-30 ENCOUNTER — Encounter (HOSPITAL_COMMUNITY): Payer: Medicare Other | Admitting: Physical Therapy

## 2015-06-01 ENCOUNTER — Encounter (HOSPITAL_COMMUNITY): Payer: Medicare Other

## 2015-06-06 ENCOUNTER — Encounter (HOSPITAL_COMMUNITY): Payer: Medicare Other | Admitting: Physical Therapy

## 2015-06-08 ENCOUNTER — Encounter (HOSPITAL_COMMUNITY): Payer: Medicare Other | Admitting: Physical Therapy

## 2016-03-20 DIAGNOSIS — M65332 Trigger finger, left middle finger: Secondary | ICD-10-CM | POA: Insufficient documentation

## 2016-09-11 ENCOUNTER — Emergency Department (HOSPITAL_COMMUNITY): Payer: Medicare Other

## 2016-09-11 ENCOUNTER — Encounter (HOSPITAL_COMMUNITY): Payer: Self-pay | Admitting: Emergency Medicine

## 2016-09-11 ENCOUNTER — Emergency Department (HOSPITAL_COMMUNITY)
Admission: EM | Admit: 2016-09-11 | Discharge: 2016-09-11 | Disposition: A | Discharge status: Home / Self Care | Payer: Medicare Other | Attending: Emergency Medicine | Admitting: Emergency Medicine

## 2016-09-11 DIAGNOSIS — Z79899 Other long term (current) drug therapy: Secondary | ICD-10-CM | POA: Insufficient documentation

## 2016-09-11 DIAGNOSIS — R42 Dizziness and giddiness: Secondary | ICD-10-CM | POA: Diagnosis present

## 2016-09-11 DIAGNOSIS — I10 Essential (primary) hypertension: Secondary | ICD-10-CM | POA: Diagnosis not present

## 2016-09-11 DIAGNOSIS — E039 Hypothyroidism, unspecified: Secondary | ICD-10-CM | POA: Insufficient documentation

## 2016-09-11 DIAGNOSIS — N390 Urinary tract infection, site not specified: Secondary | ICD-10-CM

## 2016-09-11 HISTORY — DX: Essential (primary) hypertension: I10

## 2016-09-11 LAB — CBC WITH DIFFERENTIAL/PLATELET
Basophils Absolute: 0 10*3/uL (ref 0.0–0.1)
Basophils Relative: 1 %
EOS PCT: 0 %
Eosinophils Absolute: 0 10*3/uL (ref 0.0–0.7)
HEMATOCRIT: 43.7 % (ref 36.0–46.0)
Hemoglobin: 14.8 g/dL (ref 12.0–15.0)
LYMPHS ABS: 2.8 10*3/uL (ref 0.7–4.0)
LYMPHS PCT: 32 %
MCH: 31.5 pg (ref 26.0–34.0)
MCHC: 33.9 g/dL (ref 30.0–36.0)
MCV: 93 fL (ref 78.0–100.0)
Monocytes Absolute: 0.7 10*3/uL (ref 0.1–1.0)
Monocytes Relative: 8 %
NEUTROS ABS: 5.1 10*3/uL (ref 1.7–7.7)
Neutrophils Relative %: 59 %
PLATELETS: 343 10*3/uL (ref 150–400)
RBC: 4.7 MIL/uL (ref 3.87–5.11)
RDW: 13.7 % (ref 11.5–15.5)
WBC: 8.5 10*3/uL (ref 4.0–10.5)

## 2016-09-11 LAB — COMPREHENSIVE METABOLIC PANEL
ALK PHOS: 104 U/L (ref 38–126)
ALT: 11 U/L — AB (ref 14–54)
AST: 23 U/L (ref 15–41)
Albumin: 4 g/dL (ref 3.5–5.0)
Anion gap: 11 (ref 5–15)
BILIRUBIN TOTAL: 0.5 mg/dL (ref 0.3–1.2)
BUN: 23 mg/dL — AB (ref 6–20)
CALCIUM: 9.8 mg/dL (ref 8.9–10.3)
CHLORIDE: 109 mmol/L (ref 101–111)
CO2: 25 mmol/L (ref 22–32)
CREATININE: 1.01 mg/dL — AB (ref 0.44–1.00)
GFR, EST AFRICAN AMERICAN: 56 mL/min — AB (ref >60)
GFR, EST NON AFRICAN AMERICAN: 48 mL/min — AB (ref >60)
Glucose, Bld: 148 mg/dL — ABNORMAL HIGH (ref 65–99)
Potassium: 3.7 mmol/L (ref 3.5–5.1)
Sodium: 145 mmol/L (ref 135–145)
TOTAL PROTEIN: 7.3 g/dL (ref 6.5–8.1)

## 2016-09-11 LAB — I-STAT TROPONIN, ED: Troponin i, poc: 0.08 ng/mL (ref 0.00–0.08)

## 2016-09-11 LAB — URINALYSIS, ROUTINE W REFLEX MICROSCOPIC
Bacteria, UA: NONE SEEN
Bilirubin Urine: NEGATIVE
GLUCOSE, UA: NEGATIVE mg/dL
HGB URINE DIPSTICK: NEGATIVE
Ketones, ur: NEGATIVE mg/dL
NITRITE: NEGATIVE
Protein, ur: 30 mg/dL — AB
SPECIFIC GRAVITY, URINE: 1.025 (ref 1.005–1.030)
pH: 5 (ref 5.0–8.0)

## 2016-09-11 MED ORDER — CEPHALEXIN 500 MG PO CAPS: 500.0000 mg | ORAL_CAPSULE | Freq: Two times a day (BID) | ORAL | 0 refills | Status: DC

## 2016-09-11 MED ORDER — HYDRALAZINE HCL 20 MG/ML IJ SOLN
10.0000 mg | Freq: Once | INTRAMUSCULAR | Status: AC
  Administered 2016-09-11: 10 mg via INTRAVENOUS
  Filled 2016-09-11: qty 1

## 2016-09-11 MED ORDER — DEXTROSE 5 % IV SOLN
1.0000 g | Freq: Once | INTRAVENOUS | Status: AC
  Administered 2016-09-11: 1 g via INTRAVENOUS
  Filled 2016-09-11: qty 10

## 2016-09-11 NOTE — ED Provider Notes (Signed)
[facility] Provider Note   CSN: [id number] Arrival date & time: 09/11/16  0111     History   Chief Complaint Chief Complaint  Patient presents with  . Dizziness    HPI Andrea [name] is a 83 y.o. female.  Patient presents for evaluation of dizziness. Patient reports that she worked outside all day. She mowed several acres of lawn. She came in and 8 and then noticed that she was feeling dizzy. She took her blood pressure and it was elevated. EMS checked her out the house, reported that she appeared well but her pressure was elevated and she should come to the ER for evaluation. She says she has a very slight frontal headache currently. No chest pain or shortness of breath. She reports that she feels well when she is lying down but if she gets up and moves around the dizziness occurs.      Past Medical History:  Diagnosis Date  . Hypertension   . Thyroid disease     Patient Active Problem List   Diagnosis Date Noted  . Intertrochanteric fracture of left hip ([facility]) 09/01/2014  . Hypothyroidism 09/01/2014  . Hypertension 09/01/2014  . Closed left hip fracture ([facility]) 09/01/2014    Past Surgical History:  Procedure Laterality Date  . ABDOMINAL HYSTERECTOMY    . ORIF HIP FRACTURE Left 09/02/2014   Procedure: OPEN REDUCTION INTERNAL FIXATION LEFT HIP FRACTURE;  Surgeon: Darreld Mclean, MD;  Location: [facility];  Service: Orthopedics;  Laterality: Left;    OB History    No data available       Home Medications    Prior to Admission medications   Medication Sig Start Date End Date Taking? Authorizing Provider  doxazosin (CARDURA) 4 MG tablet Take 4 mg by mouth daily. 06/13/14  Yes [provider]  levothyroxine (SYNTHROID, LEVOTHROID) 125 MCG tablet Take 125 mcg by mouth daily. 06/13/14  Yes [provider]  naproxen (NAPROSYN) 500 MG tablet Take 500 mg by mouth 2 (two) times daily with a meal.   Yes [provider]  naproxen sodium (ANAPROX)  220 MG tablet Take 220-440 mg by mouth daily as needed (for artritis pain).   Yes [provider]  triamterene-hydrochlorothiazide (MAXZIDE-25) 37.5-25 MG per tablet Take 0.5 tablets by mouth daily. 06/13/14  Yes [provider]  cephALEXin (KEFLEX) 500 MG capsule Take 1 capsule (500 mg total) by mouth 2 (two) times daily. 09/11/16   Gilda Crease, MD  HYDROcodone-acetaminophen (NORCO) 7.5-325 MG tablet Take 1 tablet by mouth every 4 (four) hours as needed for moderate pain.    [provider]  oxyCODONE (OXY IR/ROXICODONE) 5 MG immediate release tablet Take 1 tablet (5 mg total) by mouth every 4 (four) hours as needed for severe pain. 09/05/14   Philip Aspen, Limmie Patricia, MD    Family History No family history on file.  Social History Social History  Substance Use Topics  . Smoking status: Never Smoker  . Smokeless tobacco: Never Used  . Alcohol use No     Allergies   Patient has no known allergies.   Review of Systems Review of Systems  Neurological: Positive for dizziness and headaches.  All other systems reviewed and are negative.    Physical Exam Updated Vital Signs BP (!) 144/70   Pulse 95   Temp 98.2 F (36.8 C)   Resp (!) 23   Ht 5\' 4"  (1.626 m)   Wt 81.6 kg (180 lb)   SpO2 98%  BMI 30.90 kg/m   Physical Exam  Constitutional: She is oriented to person, place, and time. She appears well-developed and well-nourished. No distress.  HENT:  Head: Normocephalic and atraumatic.  Right Ear: Hearing normal.  Left Ear: Hearing normal.  Nose: Nose normal.  Mouth/Throat: Oropharynx is clear and moist and mucous membranes are normal.  Eyes: Conjunctivae and EOM are normal. Pupils are equal, round, and reactive to light.  Neck: Normal range of motion. Neck supple.  Cardiovascular: Regular rhythm, S1 normal and S2 normal.  Exam reveals no gallop and no friction rub.   No murmur heard. Pulmonary/Chest: Effort normal and breath sounds  normal. No respiratory distress. She exhibits no tenderness.  Abdominal: Soft. Normal appearance and bowel sounds are normal. There is no hepatosplenomegaly. There is no tenderness. There is no rebound, no guarding, no tenderness at McBurney's point and negative Murphy's sign. No hernia.  Musculoskeletal: Normal range of motion.  Neurological: She is alert and oriented to person, place, and time. She has normal strength. No cranial nerve deficit or sensory deficit. Coordination normal. GCS eye subscore is 4. GCS verbal subscore is 5. GCS motor subscore is 6.  Extraocular muscle movement: normal No visual field cut Pupils: equal and reactive both direct and consensual response is normal No nystagmus present    Sensory function is intact to light touch, pinprick Proprioception intact  Grip strength 5/5 symmetric in upper extremities No pronator drift Normal finger to nose bilaterally  Lower extremity strength 5/5 against gravity Normal heel to shin bilaterally  Gait: normal   Skin: Skin is warm, dry and intact. No rash noted. No cyanosis.  Psychiatric: She has a normal mood and affect. Her speech is normal and behavior is normal. Thought content normal.  Nursing note and vitals reviewed.    ED Treatments / Results  Labs (all labs ordered are listed, but only abnormal results are displayed) Labs Reviewed  COMPREHENSIVE METABOLIC PANEL - Abnormal; Notable for the following:       Result Value   Glucose, Bld 148 (*)    BUN 23 (*)    Creatinine, Ser 1.01 (*)    ALT 11 (*)    GFR calc non Af Amer 48 (*)    GFR calc Af Amer 56 (*)    All other components within normal limits  URINALYSIS, ROUTINE W REFLEX MICROSCOPIC - Abnormal; Notable for the following:    Protein, ur 30 (*)    Leukocytes, UA SMALL (*)    Squamous Epithelial / LPF 0-5 (*)    All other components within normal limits  URINE CULTURE  CBC WITH DIFFERENTIAL/PLATELET  Rose[facility] SensorI-STAT TROPOININ, ED    EKG  EKG  Interpretation  Date/Time:  Wednesday September 11 2016 01:34:47 EDT Ventricular Rate:  83 PR Interval:    QRS Duration: 97 QT Interval:  355 QTC Calculation: 418 R Axis:   11 Text Interpretation:  Sinus rhythm Atrial premature complex Otherwise within normal limits Confirmed by Gilda CreasePollina, Lalonnie Shaffer J [phone]([id number]) on 09/11/2016 3:07:29 AM       Radiology Ct Head Wo Contrast  Result Date: 09/11/2016 CLINICAL DATA:  Dizziness with shaking EXAM: CT HEAD WITHOUT CONTRAST TECHNIQUE: Contiguous axial images were obtained from the base of the skull through the vertex without intravenous contrast. COMPARISON:  09/01/2014 FINDINGS: Brain: No evidence of acute infarction, hemorrhage, hydrocephalus, extra-axial collection or mass lesion/mass effect. Mild to moderate periventricular and subcortical white matter small vessel ischemic change. Vascular: No hyperdense vessels.  Carotid artery calcifications. Skull:  Normal. Negative for fracture or focal lesion. Sinuses/Orbits: No acute finding. Other: None IMPRESSION: No CT evidence for acute intracranial abnormality. Electronically Signed   By: Jasmine Pang M.D.   On: 09/11/2016 02:11    Procedures Procedures (including critical care time)  Medications Ordered in ED Medications  cefTRIAXone (ROCEPHIN) 1 g in dextrose 5 % 50 mL IVPB (not administered)  hydrALAZINE (APRESOLINE) injection 10 mg (10 mg Intravenous Given 09/11/16 0147)     Initial Impression / Assessment and Plan / ED Course  I have reviewed the triage vital signs and the nursing notes.  Pertinent labs & imaging results that were available during my care of the patient were reviewed by me and considered in my medical decision making (see chart for details).     Patient presents with complaints of dizziness. Blood pressure was elevated at home. She does have a history of hypertension. She is not expressing chest pain or shortness of breath. Patient administered hydralazine with significant  improvement of her blood pressure. She has been ambulatory here in the ER, has a normal neurologic exam. Head CT was negative. Blood work was unremarkable but she does have evidence of a urinary tract infection. Dizziness was likely multifactorial with the elevated blood pressure and UTI. She was administered IV Rocephin here in the ER. As she is much improved, she is appropriate for discharge, follow-up with PCP. Will treat with Keflex outpatient. Urine culture pending.  Final Clinical Impressions(s) / ED Diagnoses   Final diagnoses:  Essential hypertension  Dizziness  Lower urinary tract infectious disease    New Prescriptions New Prescriptions   CEPHALEXIN (KEFLEX) 500 MG CAPSULE    Take 1 capsule (500 mg total) by mouth 2 (two) times daily.     Gilda Crease, MD 09/11/16 [phone]

## 2016-09-11 NOTE — ED Triage Notes (Signed)
Pt c/o episodes of dizziness and shakiness. Pt states blood pressure was high at home.

## 2016-09-12 LAB — URINE CULTURE: CULTURE: NO GROWTH

## 2017-01-25 ENCOUNTER — Inpatient Hospital Stay (HOSPITAL_COMMUNITY)
Admission: EM | Admit: 2017-01-25 | Discharge: 2017-01-28 | DRG: 065 | Disposition: A | Discharge status: Rehabilitation Facility | Payer: Medicare Other | Attending: Internal Medicine | Admitting: Internal Medicine

## 2017-01-25 ENCOUNTER — Emergency Department (HOSPITAL_COMMUNITY): Payer: Medicare Other

## 2017-01-25 ENCOUNTER — Observation Stay (HOSPITAL_COMMUNITY): Payer: Medicare Other

## 2017-01-25 ENCOUNTER — Encounter (HOSPITAL_COMMUNITY): Payer: Self-pay | Admitting: *Deleted

## 2017-01-25 DIAGNOSIS — I491 Atrial premature depolarization: Secondary | ICD-10-CM | POA: Diagnosis present

## 2017-01-25 DIAGNOSIS — M1710 Unilateral primary osteoarthritis, unspecified knee: Secondary | ICD-10-CM | POA: Diagnosis not present

## 2017-01-25 DIAGNOSIS — E038 Other specified hypothyroidism: Secondary | ICD-10-CM

## 2017-01-25 DIAGNOSIS — G8194 Hemiplegia, unspecified affecting left nondominant side: Secondary | ICD-10-CM | POA: Diagnosis present

## 2017-01-25 DIAGNOSIS — G459 Transient cerebral ischemic attack, unspecified: Secondary | ICD-10-CM | POA: Diagnosis present

## 2017-01-25 DIAGNOSIS — I10 Essential (primary) hypertension: Secondary | ICD-10-CM | POA: Diagnosis present

## 2017-01-25 DIAGNOSIS — Z79899 Other long term (current) drug therapy: Secondary | ICD-10-CM

## 2017-01-25 DIAGNOSIS — M1990 Unspecified osteoarthritis, unspecified site: Secondary | ICD-10-CM | POA: Diagnosis present

## 2017-01-25 DIAGNOSIS — M170 Bilateral primary osteoarthritis of knee: Secondary | ICD-10-CM | POA: Diagnosis not present

## 2017-01-25 DIAGNOSIS — E785 Hyperlipidemia, unspecified: Secondary | ICD-10-CM | POA: Diagnosis present

## 2017-01-25 DIAGNOSIS — E870 Hyperosmolality and hypernatremia: Secondary | ICD-10-CM | POA: Diagnosis present

## 2017-01-25 DIAGNOSIS — R944 Abnormal results of kidney function studies: Secondary | ICD-10-CM | POA: Diagnosis not present

## 2017-01-25 DIAGNOSIS — M19041 Primary osteoarthritis, right hand: Secondary | ICD-10-CM

## 2017-01-25 DIAGNOSIS — K5900 Constipation, unspecified: Secondary | ICD-10-CM | POA: Diagnosis not present

## 2017-01-25 DIAGNOSIS — I69354 Hemiplegia and hemiparesis following cerebral infarction affecting left non-dominant side: Secondary | ICD-10-CM | POA: Diagnosis not present

## 2017-01-25 DIAGNOSIS — E039 Hypothyroidism, unspecified: Secondary | ICD-10-CM | POA: Diagnosis present

## 2017-01-25 DIAGNOSIS — R200 Anesthesia of skin: Secondary | ICD-10-CM | POA: Diagnosis not present

## 2017-01-25 DIAGNOSIS — Z87440 Personal history of urinary (tract) infections: Secondary | ICD-10-CM

## 2017-01-25 DIAGNOSIS — M19042 Primary osteoarthritis, left hand: Secondary | ICD-10-CM

## 2017-01-25 DIAGNOSIS — Z79890 Hormone replacement therapy (postmenopausal): Secondary | ICD-10-CM | POA: Diagnosis not present

## 2017-01-25 DIAGNOSIS — I639 Cerebral infarction, unspecified: Secondary | ICD-10-CM | POA: Diagnosis present

## 2017-01-25 DIAGNOSIS — Z8249 Family history of ischemic heart disease and other diseases of the circulatory system: Secondary | ICD-10-CM | POA: Diagnosis not present

## 2017-01-25 DIAGNOSIS — M159 Polyosteoarthritis, unspecified: Secondary | ICD-10-CM | POA: Diagnosis not present

## 2017-01-25 LAB — CBC WITH DIFFERENTIAL/PLATELET
BASOS ABS: 0 10*3/uL (ref 0.0–0.1)
Basophils Relative: 0 %
EOS ABS: 0 10*3/uL (ref 0.0–0.7)
EOS PCT: 0 %
HCT: 44.6 % (ref 36.0–46.0)
Hemoglobin: 15.2 g/dL — ABNORMAL HIGH (ref 12.0–15.0)
LYMPHS PCT: 23 %
Lymphs Abs: 2 10*3/uL (ref 0.7–4.0)
MCH: 32.2 pg (ref 26.0–34.0)
MCHC: 34.1 g/dL (ref 30.0–36.0)
MCV: 94.5 fL (ref 78.0–100.0)
Monocytes Absolute: 0.5 10*3/uL (ref 0.1–1.0)
Monocytes Relative: 6 %
NEUTROS PCT: 71 %
Neutro Abs: 6.3 10*3/uL (ref 1.7–7.7)
PLATELETS: 368 10*3/uL (ref 150–400)
RBC: 4.72 MIL/uL (ref 3.87–5.11)
RDW: 13.7 % (ref 11.5–15.5)
WBC: 8.9 10*3/uL (ref 4.0–10.5)

## 2017-01-25 LAB — ECHOCARDIOGRAM COMPLETE
AOPV: 0.6 m/s
AV Area VTI index: 0.66 cm2/m2
AV Area VTI: 1.37 cm2
AV Mean grad: 9 mmHg
AV Peak grad: 17 mmHg
AV VEL mean LVOT/AV: 0.6
AV peak Index: 0.71
AV pk vel: 204 cm/s
AVAREAMEANV: 1.36 cm2
AVAREAMEANVIN: 0.7 cm2/m2
CHL CUP AV VEL: 1.28
CHL CUP DOP CALC LVOT VTI: 23.8 cm
CHL CUP RV SYS PRESS: 32 mmHg
CHL CUP STROKE VOLUME: 31 mL
CHL CUP TV REG PEAK VELOCITY: 267 cm/s
DOP CAL AO MEAN VELOCITY: 137 cm/s
E decel time: 313 msec
E/e' ratio: 15.91
FS: 37 % (ref 28–44)
Height: 64 in
IVS/LV PW RATIO, ED: 0.94
LA diam end sys: 35 mm
LADIAMINDEX: 1.81 cm/m2
LASIZE: 35 mm
LAVOL: 50 mL
LAVOLA4C: 64.9 mL
LAVOLIN: 25.8 mL/m2
LDCA: 2.27 cm2
LV E/e' medial: 15.91
LV E/e'average: 15.91
LV sys vol index: 5 mL/m2
LVDIAVOL: 41 mL — AB (ref 46–106)
LVDIAVOLIN: 21 mL/m2
LVELAT: 5.55 cm/s
LVOT SV: 54 mL
LVOT peak grad rest: 6 mmHg
LVOT peak vel: 123 cm/s
LVOTD: 17 mm
LVOTVTI: 0.57 cm
LVSYSVOL: 10 mL — AB (ref 14–42)
MV Dec: 313
MV Peak grad: 3 mmHg
MVPKAVEL: 115 m/s
MVPKEVEL: 88.3 m/s
P 1/2 time: 577 ms
PW: 15.4 mm — AB (ref 0.6–1.1)
RV LATERAL S' VELOCITY: 16.4 cm/s
RV TAPSE: 25.7 mm
Simpson's disk: 76
TDI e' lateral: 5.55
TDI e' medial: 5.87
TR max vel: 267 cm/s
VTI: 42.1 cm
Valve area index: 0.66
Valve area: 1.28 cm2
Weight: 2848 oz

## 2017-01-25 LAB — BASIC METABOLIC PANEL
Anion gap: 10 (ref 5–15)
BUN: 20 mg/dL (ref 6–20)
CO2: 28 mmol/L (ref 22–32)
Calcium: 10.1 mg/dL (ref 8.9–10.3)
Chloride: 108 mmol/L (ref 101–111)
Creatinine, Ser: 0.98 mg/dL (ref 0.44–1.00)
GFR, EST AFRICAN AMERICAN: 58 mL/min — AB (ref >60)
GFR, EST NON AFRICAN AMERICAN: 50 mL/min — AB (ref >60)
Glucose, Bld: 119 mg/dL — ABNORMAL HIGH (ref 65–99)
POTASSIUM: 4 mmol/L (ref 3.5–5.1)
SODIUM: 146 mmol/L — AB (ref 135–145)

## 2017-01-25 LAB — URINALYSIS, ROUTINE W REFLEX MICROSCOPIC
BILIRUBIN URINE: NEGATIVE
GLUCOSE, UA: NEGATIVE mg/dL
HGB URINE DIPSTICK: NEGATIVE
KETONES UR: NEGATIVE mg/dL
Leukocytes, UA: NEGATIVE
Nitrite: NEGATIVE
PROTEIN: NEGATIVE mg/dL
Specific Gravity, Urine: 1.006 (ref 1.005–1.030)
pH: 7 (ref 5.0–8.0)

## 2017-01-25 LAB — TSH: TSH: 31.569 u[IU]/mL — ABNORMAL HIGH (ref 0.350–4.500)

## 2017-01-25 LAB — TROPONIN I

## 2017-01-25 MED ORDER — ACETAMINOPHEN 325 MG PO TABS
650.0000 mg | ORAL_TABLET | ORAL | Status: DC | PRN
  Administered 2017-01-26: 650 mg via ORAL
  Filled 2017-01-25: qty 2

## 2017-01-25 MED ORDER — LEVOTHYROXINE SODIUM 25 MCG PO TABS
125.0000 ug | ORAL_TABLET | Freq: Every day | ORAL | Status: DC
  Administered 2017-01-25 – 2017-01-28 (×4): 125 ug via ORAL
  Filled 2017-01-25 (×4): qty 1

## 2017-01-25 MED ORDER — ASPIRIN 81 MG PO CHEW
324.0000 mg | CHEWABLE_TABLET | Freq: Once | ORAL | Status: AC
  Administered 2017-01-25: 324 mg via ORAL
  Filled 2017-01-25: qty 4

## 2017-01-25 MED ORDER — STROKE: EARLY STAGES OF RECOVERY BOOK
Freq: Once | Status: AC
  Administered 2017-01-25: 09:00:00
  Filled 2017-01-25: qty 1

## 2017-01-25 MED ORDER — ASPIRIN EC 81 MG PO TBEC
81.0000 mg | DELAYED_RELEASE_TABLET | Freq: Every day | ORAL | Status: DC
  Administered 2017-01-26 – 2017-01-28 (×3): 81 mg via ORAL
  Filled 2017-01-25 (×3): qty 1

## 2017-01-25 MED ORDER — SODIUM CHLORIDE 0.45 % IV SOLN
INTRAVENOUS | Status: DC
  Administered 2017-01-25: 09:00:00 via INTRAVENOUS

## 2017-01-25 MED ORDER — TRIAMTERENE-HCTZ 37.5-25 MG PO TABS
1.0000 | ORAL_TABLET | Freq: Every day | ORAL | Status: DC
  Administered 2017-01-25 – 2017-01-28 (×4): 1 via ORAL
  Filled 2017-01-25 (×4): qty 1

## 2017-01-25 MED ORDER — ENOXAPARIN SODIUM 40 MG/0.4ML ~~LOC~~ SOLN: 40.0000 mg | SUBCUTANEOUS | Status: DC

## 2017-01-25 MED ORDER — ENOXAPARIN SODIUM 40 MG/0.4ML ~~LOC~~ SOLN
40.0000 mg | SUBCUTANEOUS | Status: DC
  Administered 2017-01-25 – 2017-01-28 (×4): 40 mg via SUBCUTANEOUS
  Filled 2017-01-25 (×4): qty 0.4

## 2017-01-25 MED ORDER — ACETAMINOPHEN 650 MG RE SUPP
650.0000 mg | RECTAL | Status: DC | PRN
Start: 2017-01-25 — End: 2017-01-28

## 2017-01-25 MED ORDER — ACETAMINOPHEN 160 MG/5ML PO SOLN: 650.0000 mg | ORAL | Status: DC | PRN

## 2017-01-25 MED ORDER — FAMOTIDINE 20 MG PO TABS
20.0000 mg | ORAL_TABLET | Freq: Two times a day (BID) | ORAL | Status: DC
  Administered 2017-01-25 – 2017-01-28 (×6): 20 mg via ORAL
  Filled 2017-01-25 (×7): qty 1

## 2017-01-25 NOTE — ED Provider Notes (Addendum)
[facility] EMERGENCY DEPARTMENT Provider Note   CSN: [id number] Arrival date & time: 01/25/17  0410     History   Chief Complaint Chief Complaint  Patient presents with  . Weakness    HPI Andrea [name] is a 83 y.o. female.  Patient is an 83 year old female with past medical history of hypertension, hypothyroidism, and prior UTIs.  She presents today for evaluation of left arm numbness.  This was present early this morning when she was walking across her bedroom to get into bed.  She was holding her cell phone which she then dropped stating that her hand was weak.  She had difficulty picking it up.  She also describes weakness in her right leg.  She reports similar symptoms several months ago during which time she was diagnosed with a urinary tract infection.  She was treated with antibiotics and these symptoms resolved.  She denies any urinary complaints at present.   The history is provided by the patient.  Weakness  Primary symptoms include focal weakness, loss of sensation. This is a new problem. The current episode started less than 1 hour ago. The problem has not changed since onset.There was no focality noted. There has been no fever. Pertinent negatives include no shortness of breath, no chest pain and no confusion.    Past Medical History:  Diagnosis Date  . Hypertension   . Thyroid disease     Patient Active Problem List   Diagnosis Date Noted  . Intertrochanteric fracture of left hip ([facility]) 09/01/2014  . Hypothyroidism 09/01/2014  . Hypertension 09/01/2014  . Closed left hip fracture ([facility]) 09/01/2014    Past Surgical History:  Procedure Laterality Date  . ABDOMINAL HYSTERECTOMY    . ORIF HIP FRACTURE Left 09/02/2014   Procedure: OPEN REDUCTION INTERNAL FIXATION LEFT HIP FRACTURE;  Surgeon: Darreld Mclean, MD;  Location: [facility];  Service: Orthopedics;  Laterality: Left;    OB History    No data available       Home Medications    Prior to Admission  medications   Medication Sig Start Date End Date Taking? Authorizing Provider  doxazosin (CARDURA) 4 MG tablet Take 4 mg by mouth daily. 06/13/14  Yes [provider]  HYDROcodone-acetaminophen (NORCO) 7.5-325 MG tablet Take 1 tablet by mouth every 4 (four) hours as needed for moderate pain.   Yes [provider]  levothyroxine (SYNTHROID, LEVOTHROID) 125 MCG tablet Take 125 mcg by mouth daily. 06/13/14  Yes [provider]  naproxen (NAPROSYN) 500 MG tablet Take 500 mg by mouth 2 (two) times daily with a meal.   Yes [provider]  naproxen sodium (ANAPROX) 220 MG tablet Take 220-440 mg by mouth daily as needed (for artritis pain).   Yes [provider]  triamterene-hydrochlorothiazide (MAXZIDE-25) 37.5-25 MG per tablet Take 0.5 tablets by mouth daily. 06/13/14  Yes [provider]  cephALEXin (KEFLEX) 500 MG capsule Take 1 capsule (500 mg total) by mouth 2 (two) times daily. 09/11/16   Gilda Crease, MD  oxyCODONE (OXY IR/ROXICODONE) 5 MG immediate release tablet Take 1 tablet (5 mg total) by mouth every 4 (four) hours as needed for severe pain. 09/05/14   Philip Aspen, Limmie Patricia, MD    Family History No family history on file.  Social History Social History  Substance Use Topics  . Smoking status: Never Smoker  . Smokeless tobacco: Never Used  . Alcohol use No     Allergies   Patient has no known  allergies.   Review of Systems Review of Systems  Respiratory: Negative for shortness of breath.   Cardiovascular: Negative for chest pain.  Neurological: Positive for focal weakness and weakness.  Psychiatric/Behavioral: Negative for confusion.  All other systems reviewed and are negative.    Physical Exam Updated Vital Signs BP (!) 209/96 (BP Location: Right Arm)   Pulse 78   Temp 97.9 F (36.6 C) (Oral)   Resp 18   Ht 5\' 4"  (1.626 m)   Wt 80.7 kg (178 lb)   SpO2 96%   BMI 30.55 kg/m   Physical Exam    Constitutional: She is oriented to person, place, and time. She appears well-developed and well-nourished. No distress.  HENT:  Head: Normocephalic and atraumatic.  Mouth/Throat: Oropharynx is clear and moist.  Eyes: Pupils are equal, round, and reactive to light. EOM are normal.  Neck: Normal range of motion. Neck supple.  Cardiovascular: Normal rate and regular rhythm.  Exam reveals no gallop and no friction rub.   No murmur heard. Pulmonary/Chest: Effort normal and breath sounds normal. No respiratory distress. She has no wheezes.  Abdominal: Soft. Bowel sounds are normal. She exhibits no distension. There is no tenderness.  Musculoskeletal: Normal range of motion.  Neurological: She is alert and oriented to person, place, and time. No cranial nerve deficit. She exhibits normal muscle tone. Coordination normal.  Skin: Skin is warm and dry. She is not diaphoretic.  Nursing note and vitals reviewed.    ED Treatments / Results  Labs (all labs ordered are listed, but only abnormal results are displayed) Labs Reviewed  BASIC METABOLIC PANEL  CBC WITH DIFFERENTIAL/PLATELET  TROPONIN I  URINALYSIS, ROUTINE W REFLEX MICROSCOPIC    EKG  EKG Interpretation  Date/Time:  Saturday January 25 2017 04:21:24 EDT Ventricular Rate:  85 PR Interval:    QRS Duration: 105 QT Interval:  390 QTC Calculation: 464 R Axis:   12 Text Interpretation:  Sinus rhythm Atrial premature complex Borderline T abnormalities, inferior leads Confirmed by Geoffery Lyons ([id number]) on 01/25/2017 4:36:29 AM       Radiology No results found.  Procedures Procedures (including critical care time)  Medications Ordered in ED Medications - No data to display   Initial Impression / Assessment and Plan / ED Course  I have reviewed the triage vital signs and the nursing notes.  Pertinent labs & imaging results that were available during my care of the patient were reviewed by me and considered in my medical  decision making (see chart for details).  Patient presents with complaints of left arm numbness that began this evening while walking across her bedroom floor.  She reports dropping her phone due to the symptoms.  She was concerned that she may have a UTI as she had similar symptoms in the past with this, however urinalysis this evening is clear.  Remainder of the workup is also essentially unremarkable including laboratory studies and head CT.  She is mainly reporting a subjective numbness of the left hand.  She does have 5 out of 5 strength in all extremities.  She may have some mild coordination issues with the left hand, however she does report that her symptoms are improving.  I suspect a possible TIA in this patient. This has been dicussed with Dr. Robb Matar who will evaluate and admit.  ADDENDUM:  The patient's onset of symptoms were somewhat unclear, her neurologic exam was nonfocal, symptoms were improving since onset, and she was also complaining of contralateral leg  numbness.  For the above reasons, I do not feel as though the patient is a candidate for TPA.  Final Clinical Impressions(s) / ED Diagnoses   Final diagnoses:  None    New Prescriptions New Prescriptions   No medications on file     Geoffery Lyonselo, Taisha Pennebaker, MD 01/25/17 [id number]    Geoffery Lyonselo, Liesl Simons, MD 02/09/17 [phone]

## 2017-01-25 NOTE — H&P (Addendum)
History and Physical    Andrea [name] WUJ:[id number] DOB: Apr 05, 1925 DOA: 01/25/2017  PCP: [facility]   Patient coming from: Home.  I have personally briefly reviewed patient's old medical records in [facility]  Chief Complaint: Left hand heaviness.  HPI: Andrea [name] is a 83 y.o. female with medical history significant of hypertension, hypothyroidism, osteoarthritis, UTIs who is being brought via EMS to the hospital after she developed weakness and numbness of her left upper extremity, particularly her hand. She mentions that she was walking across her bedroom to get into bed and was holding her cell phone. Then she dropped it unintentionally and when she tried up picked up with her left hand noticed a lot of difficulty. She also developed left lower extremity weakness. She mentions that she has had 2 similar episodes like this when she has developed UTIs and usually they resolve with antibiotics. She denies headache, dizziness, blurred vision, word finding difficulty, her friend denies slurred speech, but mentions that she was so weak that she had to be helped by 2 of the EMS crew to get up from the floor. Denies chest pain, dyspnea, diaphoresis, nausea, emesis, recent PND, orthopnea or pitting edema of the lower extremities. She denies abdominal pain, diarrhea, melena or hematochezia. She gets constipated on occasion. She denies dysuria, frequency or hematuria. No polyuria, polydipsia or polyphagia.  ED Course: Initial vital signs temperature 97.29F, pulse 96, blood pressure 209/96 mmHg, respirations 18 and O2 sat 96% on room air.  Her workup shows normal urinalysis. WBC 8.9 with 71% neutrophils, hemoglobin 15.2 g/dL and platelets 956. BMP showed a sodium of 146 mmol/L and a glucose of 119 mg/dL. All other values were unremarkable. Troponin level was normal. EKG was the sinus rhythm with APCs, questionable borderline T-wave abnormalities. Her CT head was negative for  acute intracranial pathology.  Review of Systems: As per HPI otherwise 10 point review of systems negative.    Past Medical History:  Diagnosis Date  . Hypertension   . Thyroid disease     Past Surgical History:  Procedure Laterality Date  . ABDOMINAL HYSTERECTOMY    . ORIF HIP FRACTURE Left 09/02/2014   Procedure: OPEN REDUCTION INTERNAL FIXATION LEFT HIP FRACTURE;  Surgeon: Darreld Mclean, MD;  Location: [facility];  Service: Orthopedics;  Laterality: Left;     reports that she has never smoked. She has never used smokeless tobacco. She reports that she does not drink alcohol or use drugs.  No Known Allergies  Family History  Problem Relation Age of Onset  . CAD Mother 47       Died of MI  . Heart failure Father   . CAD Father     Prior to Admission medications   Medication Sig Start Date End Date Taking? Authorizing Provider  doxazosin (CARDURA) 4 MG tablet Take 4 mg by mouth daily. 06/13/14  Yes [provider]  HYDROcodone-acetaminophen (NORCO) 7.5-325 MG tablet Take 1 tablet by mouth every 4 (four) hours as needed for moderate pain.   Yes [provider]  levothyroxine (SYNTHROID, LEVOTHROID) 125 MCG tablet Take 125 mcg by mouth daily. 06/13/14  Yes [provider]  naproxen (NAPROSYN) 500 MG tablet Take 500 mg by mouth 2 (two) times daily with a meal.   Yes [provider]  naproxen sodium (ANAPROX) 220 MG tablet Take 220-440 mg by mouth daily as needed (for artritis pain).   Yes [provider]  triamterene-hydrochlorothiazide (MAXZIDE-25) 37.5-25 MG per tablet Take  0.5 tablets by mouth daily. 06/13/14  Yes [provider]  cephALEXin (KEFLEX) 500 MG capsule Take 1 capsule (500 mg total) by mouth 2 (two) times daily. 09/11/16   Gilda CreasePollina, Christopher J, MD  oxyCODONE (OXY IR/ROXICODONE) 5 MG immediate release tablet Take 1 tablet (5 mg total) by mouth every 4 (four) hours as needed for severe pain. 09/05/14   Philip AspenHernandez Acosta,  Limmie PatriciaEstela Y, MD    Physical Exam: Vitals:   01/25/17 0415 01/25/17 0418 01/25/17 0430  BP:  (!) 209/96 (!) 181/93  Pulse:  78 73  Resp:  18 13  Temp:  97.9 F (36.6 C)   TempSrc:  Oral   SpO2:  96% 99%  Weight: 80.7 kg (178 lb)    Height: 5\' 4"  (1.626 m)      Constitutional: Pleasant, in NAD, calm, comfortable Eyes: PERRL, lids and conjunctivae normal ENMT: Mucous membranes are moist. Posterior pharynx clear of any exudate or lesions. Neck: normal, supple, no masses, no thyromegaly Respiratory: clear to auscultation bilaterally, no wheezing, no crackles. Normal respiratory effort. No accessory muscle use.  Cardiovascular: Regular rate and rhythm, occasional extra systole, +2/6 systolic murmur, rubs / gallops. No extremity edema. 2+ pedal pulses. No carotid bruits.  Abdomen: Soft, no tenderness, no masses palpated. No hepatosplenomegaly. Bowel sounds positive.  Musculoskeletal: no clubbing / cyanosis. Left hand fingers are mildly flexed/contracted. Good ROM.  Normal muscle tone.  Skin: Other than freckles, no rashes, lesions, ulcers on limited dermatological exam. Neurologic: CN 2-12 grossly intact. Decreased sensation on left sided hand and foot, DTR normal. 4/5 left sided hemiparesis. Failed finger test with left upper extremity Psychiatric: Normal judgment and insight. Alert and oriented x 4. Normal mood.    Labs on Admission: I have personally reviewed following labs and imaging studies  CBC:  Recent Labs Lab 01/25/17 0436  WBC 8.9  NEUTROABS 6.3  HGB 15.2*  HCT 44.6  MCV 94.5  PLT 368   Basic Metabolic Panel:  Recent Labs Lab 01/25/17 0436  NA 146*  K 4.0  CL 108  CO2 28  GLUCOSE 119*  BUN 20  CREATININE 0.98  CALCIUM 10.1   GFR: Estimated Creatinine Clearance: 40 mL/min (by C-G formula based on SCr of 0.98 mg/dL). Liver Function Tests: No results for input(s): AST, ALT, ALKPHOS, BILITOT, PROT, ALBUMIN in the last 168 hours. No results for input(s):  LIPASE, AMYLASE in the last 168 hours. No results for input(s): AMMONIA in the last 168 hours. Coagulation Profile: No results for input(s): INR, PROTIME in the last 168 hours. Cardiac Enzymes:  Recent Labs Lab 01/25/17 0436  TROPONINI <0.03   BNP (last 3 results) No results for input(s): PROBNP in the last 8760 hours. HbA1C: No results for input(s): HGBA1C in the last 72 hours. CBG: No results for input(s): GLUCAP in the last 168 hours. Lipid Profile: No results for input(s): CHOL, HDL, LDLCALC, TRIG, CHOLHDL, LDLDIRECT in the last 72 hours. Thyroid Function Tests: No results for input(s): TSH, T4TOTAL, FREET4, T3FREE, THYROIDAB in the last 72 hours. Anemia Panel: No results for input(s): VITAMINB12, FOLATE, FERRITIN, TIBC, IRON, RETICCTPCT in the last 72 hours. Urine analysis:    Component Value Date/Time   COLORURINE STRAW (A) 01/25/2017 0436   APPEARANCEUR CLEAR 01/25/2017 0436   LABSPEC 1.006 01/25/2017 0436   PHURINE 7.0 01/25/2017 0436   GLUCOSEU NEGATIVE 01/25/2017 0436   HGBUR NEGATIVE 01/25/2017 0436   BILIRUBINUR NEGATIVE 01/25/2017 0436   KETONESUR NEGATIVE 01/25/2017 0436   PROTEINUR NEGATIVE  01/25/2017 0436   UROBILINOGEN 0.2 01/29/2015 0536   NITRITE NEGATIVE 01/25/2017 0436   LEUKOCYTESUR NEGATIVE 01/25/2017 0436    Radiological Exams on Admission: Ct Head Wo Contrast  Result Date: 01/25/2017 CLINICAL DATA:  Acute onset of left arm heaviness. Urinary incontinence. Initial encounter. EXAM: CT HEAD WITHOUT CONTRAST TECHNIQUE: Contiguous axial images were obtained from the base of the skull through the vertex without intravenous contrast. COMPARISON:  CT of the head performed 09/11/2016 FINDINGS: Brain: No evidence of acute infarction, hemorrhage, hydrocephalus, extra-axial collection or mass lesion/mass effect. Prominence of the ventricles and sulci reflects mild to moderate cortical volume loss. Mild cerebellar atrophy is noted. Scattered periventricular and  subcortical white matter change likely reflects small vessel ischemic microangiopathy. The brainstem and fourth ventricle are within normal limits. The basal ganglia are unremarkable in appearance. The cerebral hemispheres demonstrate grossly normal gray-white differentiation. No mass effect or midline shift is seen. Vascular: No hyperdense vessel or unexpected calcification. Skull: There is no evidence of fracture; visualized osseous structures are unremarkable in appearance. Sinuses/Orbits: The orbits are within normal limits. The paranasal sinuses and mastoid air cells are well-aerated. Other: No significant soft tissue abnormalities are seen. IMPRESSION: 1. No acute intracranial pathology seen on CT. 2. Mild to moderate cortical volume loss and scattered small vessel ischemic microangiopathy. Electronically Signed   By: Roanna Raider M.D.   On: 01/25/2017 05:03    EKG: Independently reviewed.  Vent. rate 85 BPM PR interval * ms QRS duration 105 ms QT/QTc 390/464 ms P-R-T axes 65 12 -29 Sinus rhythm Atrial premature complex Borderline T abnormalities, inferior leads Not significantly changed from previous.  Assessment/Plan Principal Problem:   TIA (transient ischemic attack) Telemetry/observation. Frequent neuro checks. Swallow screen. Allow permissive hypertension Check hemoglobin A1c and lipid panel in a.m. Check carotid Dopplers. Check echocardiogram. Check MRI/MRA of the brain if available. Consider neurology evaluation if no improvement.  Active Problems:   Hypothyroidism Continue levothyroxine 125 g by mouth daily. Check TSH.    Hypertension All antihypertensive agents. Monitor blood pressure Allow permissive hypertension.    Hypernatremia Gentle and time-limited IV hydration with half tenderness. Follow-up sodium level in the morning.    DVT prophylaxis: Lovenox SQ. Code Status: Full code. Family Communication: Her friend Andrea [name] was present in the  ED. Disposition Plan: Observation for TIA workup. Consults called:  Admission status: Observation/telemetry.   Bobette Mo MD [facility] Pager [phone].  If 7PM-7AM, please contact night-coverage [email or web address] Password [facility]  01/25/2017, 6:47 AM

## 2017-01-25 NOTE — Progress Notes (Signed)
Patient has arrived on [address] and Dia CrawfordLisa Jazman Reuter, RN has assumed care of this patient.

## 2017-01-25 NOTE — ED Triage Notes (Signed)
Pt arrived by EMS from home. Pt went to bed around midnight. Woke around 330 and had a heaviness to her left arm. Pt states similar symptoms from a UTI. Pt has been incontinent of urine.

## 2017-01-25 NOTE — Evaluation (Signed)
Physical Therapy Evaluation Patient Details Name: Andrea [name] MRN: [id number] DOB: 1925-10-02 Today's Date: 01/25/2017   History of Present Illness   Andrea [name] is a 83 y.o. female with medical history significant of hypertension, hypothyroidism, osteoarthritis, UTIs who is being brought via EMS to the hospital after she developed weakness and numbness of her left upper extremity, particularly her hand. She mentions that she was walking across her bedroom to get into bed and was holding her cell phone. Then she dropped it unintentionally and when she tried up picked up with her left hand noticed a lot of difficulty. She also developed left lower extremity weakness. She mentions that she has had 2 similar episodes like this when she has developed UTIs and usually they resolve with antibiotics.   Clinical Impression  Andrea [name] normally ambulates with a cane.  She has used walker in the past as she has fx both hips.  She currently is more concerned with the motion of her Lt arm.  She was able to come from supine to sit to supine with extra time And minimal assist.  Sitting balance is fair +.  She had max assist to stand and balance was poor.  Recommend IP rehab following discharge from hospital.     Follow Up Recommendations      Equipment Recommendations  None recommended by PT    Recommendations for Other Services Rehab consult     Precautions / Restrictions Precautions Precautions: Fall Restrictions Weight Bearing Restrictions: No      Mobility  Bed Mobility Overal bed mobility: Needs Assistance Bed Mobility: Supine to Sit;Sit to Supine     Supine to sit: Min assist Sit to supine: Min assist      Transfers Overall transfer level: Needs assistance   Transfers: Sit to/from Stand Sit to Stand: Max assist            Ambulation/Gait Ambulation/Gait assistance:  (unable at this time)              Information systems manager  Rankin (Stroke Patients Only)       Balance                                             Pertinent Vitals/Pain Pain Assessment: No/denies pain    Home Living Family/patient expects to be discharged to:: Private residence Living Arrangements: Children Available Help at Discharge: Family Type of Home: House Home Access: Stairs to enter   Secretary/administrator of Steps: 3 Home Layout: One level Home Equipment: Environmental consultant - 2 wheels;Cane - single point      Prior Function                 Hand Dominance        Extremity/Trunk Assessment   Upper Extremity Assessment Upper Extremity Assessment: Defer to OT evaluation    Lower Extremity Assessment Lower Extremity Assessment: LLE deficits/detail LLE Deficits / Details: HIp flex 3/5; ab/extension 2/5; knee 3-/5 LLE Coordination: decreased gross motor       Communication      Cognition Arousal/Alertness: Awake/alert Behavior During Therapy: WFL for tasks assessed/performed Overall Cognitive Status: Within Functional Limits for tasks assessed  General Comments      Exercises     Assessment/Plan    PT Assessment Patient needs continued PT services  PT Problem List Decreased strength;Decreased activity tolerance;Decreased balance;Decreased mobility       PT Treatment Interventions Gait training;Functional mobility training;Therapeutic activities;Therapeutic exercise;Balance training;Neuromuscular re-education    PT Goals (Current goals can be found in the Care Plan section)  Acute Rehab PT Goals Patient Stated Goal: to be walking again  PT Goal Formulation: With patient Potential to Achieve Goals: Good    Frequency 7X/week   Barriers to discharge    mobility    Co-evaluation               AM-PAC PT "6 Clicks" Daily Activity  Outcome Measure Difficulty turning over in bed (including adjusting bedclothes, sheets and  blankets)?: A Little Difficulty moving from lying on back to sitting on the side of the bed? : A Lot Difficulty sitting down on and standing up from a chair with arms (e.g., wheelchair, bedside commode, etc,.)?: A Lot Help needed moving to and from a bed to chair (including a wheelchair)?: A Lot Help needed walking in hospital room?: Total Help needed climbing 3-5 steps with a railing? : Total 6 Click Score: 11    End of Session Equipment Utilized During Treatment: Gait belt Activity Tolerance: Patient tolerated treatment well Patient left: in bed;with call bell/phone within reach Nurse Communication: Mobility status PT Visit Diagnosis: Unsteadiness on feet (R26.81);Other abnormalities of gait and mobility (R26.89);Muscle weakness (generalized) (M62.81)    Time: 1132-1202 PT Time Calculation (min) (ACUTE ONLY): 30 min   Charges:   PT Evaluation $PT Eval Low Complexity: 1 Low     Virgina OrganCynthia Russell, PT CLT [phone] 01/25/2017, 12:03 PM

## 2017-01-25 NOTE — Progress Notes (Signed)
PROGRESS NOTE    Andrea [name]  ZOX:[id number] DOB: 06/21/1925 DOA: 01/25/2017 [facility]    Brief Narrative:  83 year old female who presented with left hand paresthesias. Patient has significant past medical history for hypertension, hypothyroidism, osteoarthritis, who developed acute weakness and numbness on her left upper extremity, more prominent on her left hand. Apparently she developed acute weakness on her left hand, unable to to hold objects due to loss of grip strength, associated left lower extremity weakness. EMS was called, and patient was brought into the hospital. On the initial physical examination blood pressure 209/96, 181/93, heart rate 73, respiratory rate 13, oxygen saturation 99%. Mucous membranes were moist, lungs were clear to auscultation bilaterally, no wheezing or rhonchi, heart S1-S2 present rhythmic, no gallops, +2/6 systolic murmur, abdomen soft nontender, no lower extremity edema, positive hemiparesis and left side 4 out of 5 with significant decrease drip on the left. Sodium 146, potassium 4.0, chloride 108, bicarbonate 28, glucose 119, BUN 20, creatinine 0.8, troponin less than 0.03, white count 8.9, hemoglobin 15.2, hematocrit 44.6, platelets 368. Urinalysis negative for infection, head CT with no acute changes, chest x-ray with right rotation, no infiltrates, effusions or pneumothorax. TSH 31.5, EKG normal sinus rhythm with premature atrial complexes, normal axis, normal intervals.  The patient was admitted to hospital with working diagnosis of focal neurologic deficit, rule out cluster ischemic attack, complicated by hypothyroidism.   Assessment & Plan:   Principal Problem:   TIA (transient ischemic attack) Active Problems:   Hypothyroidism   Hypertension   Hypernatremia   1. Transfer ischemic attack. Patient with persistent weakness on the left hand, will continue neuro checks, and blood pressure monitoring. Follow on carotid  doppler US, echocardiography and brain MRI. Antiplatelet therapy, check lipid profile, statin therapy if confirmed ischemic CVA.   2. Hypothyroidism. No recent change in levothyroxine dose, neck with no masses or goiter, noted elevated TSH but patient with no hypothyroid symptoms. Advice to take medication on empty stomach at least 30 to 60 min before eating.   3. Hypertension. Blood pressure 179 to 160, will resume diuretic therapy, but will be cautious not to provoke hypotension.   4. Severe osteoarthritis. Affecting hands and knees. Joint disease may be affecting hand grip as well.   DVT prophylaxis: enoxaparin  Code Status: full Family Communication:  Disposition Plan: home   Consultants:     Procedures:     Antimicrobials:       Subjective: Patient with persistent weakness on the left hand, no dyspnea or chest pain, no nausea or vomiting.  Objective: Vitals:   01/25/17 0730 01/25/17 0800 01/25/17 0839 01/25/17 0906  BP: (!) 178/88 (!) 176/89 (!) 192/82   Pulse: 66 74 73   Resp: 17 (!) 21 18   Temp:   98.7 F (37.1 C)   TempSrc:   Oral   SpO2: 98% 96% 100% 100%  Weight:      Height:        Intake/Output Summary (Last 24 hours) at 01/25/17 1143 Last data filed at 01/25/17 0809  Gross per 24 hour  Intake                0 ml  Output              300 ml  Net             -300 ml   Filed Weights   01/25/17 0415  Weight: 80.7 kg (178 lb)  Examination:   General: Not in pain or dyspnea, deconditioned Neurology: Awake and alert, decreased hand grip on the left hand E ENT: no pallor, no icterus, oral mucosa moist, no goiter or neck masses.  Cardiovascular: No JVD. S1-S2 present, rhythmic, no gallops, rubs, or murmurs. No lower extremity edema. Pulmonary: vesicular breath sounds bilaterally, adequate air movement, no wheezing, rhonchi or rales. Gastrointestinal. Abdomen flat, no organomegaly, non tender, no rebound or guarding Skin. No  rashes Musculoskeletal: significant joint deformities on the distal interphalangeal joints, and bilateral knees.     Data Reviewed: I have personally reviewed following labs and imaging studies  CBC:  Recent Labs Lab 01/25/17 0436  WBC 8.9  NEUTROABS 6.3  HGB 15.2*  HCT 44.6  MCV 94.5  PLT 368   Basic Metabolic Panel:  Recent Labs Lab 01/25/17 0436  NA 146*  K 4.0  CL 108  CO2 28  GLUCOSE 119*  BUN 20  CREATININE 0.98  CALCIUM 10.1   GFR: Estimated Creatinine Clearance: 40 mL/min (by C-G formula based on SCr of 0.98 mg/dL). Liver Function Tests: No results for input(s): AST, ALT, ALKPHOS, BILITOT, PROT, ALBUMIN in the last 168 hours. No results for input(s): LIPASE, AMYLASE in the last 168 hours. No results for input(s): AMMONIA in the last 168 hours. Coagulation Profile: No results for input(s): INR, PROTIME in the last 168 hours. Cardiac Enzymes:  Recent Labs Lab 01/25/17 0436  TROPONINI <0.03   BNP (last 3 results) No results for input(s): PROBNP in the last 8760 hours. HbA1C: No results for input(s): HGBA1C in the last 72 hours. CBG: No results for input(s): GLUCAP in the last 168 hours. Lipid Profile: No results for input(s): CHOL, HDL, LDLCALC, TRIG, CHOLHDL, LDLDIRECT in the last 72 hours. Thyroid Function Tests:  Recent Labs  01/25/17 0436  TSH 31.569*   Anemia Panel: No results for input(s): VITAMINB12, FOLATE, FERRITIN, TIBC, IRON, RETICCTPCT in the last 72 hours.    Radiology Studies: I have reviewed all of the imaging during this hospital visit personally     Scheduled Meds: .  stroke: mapping our early stages of recovery book   Does not apply Once  . [START ON 01/26/2017] aspirin EC  81 mg Oral Daily  . enoxaparin (LOVENOX) injection  40 mg Subcutaneous Q24H  . famotidine  20 mg Oral BID  . levothyroxine  125 mcg Oral QAC breakfast   Continuous Infusions: . sodium chloride       LOS: 0 days        [name]  Annett Gula, MD [facility] Pager [phone]

## 2017-01-25 NOTE — Progress Notes (Signed)
*  PRELIMINARY RESULTS* Echocardiogram 2D Echocardiogram has been performed.  Stacey DrainWhite, Mathias Bogacki J 01/25/2017, 2:12 PM

## 2017-01-26 ENCOUNTER — Inpatient Hospital Stay (HOSPITAL_COMMUNITY): Payer: Medicare Other

## 2017-01-26 DIAGNOSIS — M159 Polyosteoarthritis, unspecified: Secondary | ICD-10-CM

## 2017-01-26 LAB — BASIC METABOLIC PANEL
ANION GAP: 7 (ref 5–15)
BUN: 18 mg/dL (ref 6–20)
CHLORIDE: 107 mmol/L (ref 101–111)
CO2: 25 mmol/L (ref 22–32)
Calcium: 9.1 mg/dL (ref 8.9–10.3)
Creatinine, Ser: 0.99 mg/dL (ref 0.44–1.00)
GFR, EST AFRICAN AMERICAN: 57 mL/min — AB (ref >60)
GFR, EST NON AFRICAN AMERICAN: 49 mL/min — AB (ref >60)
Glucose, Bld: 118 mg/dL — ABNORMAL HIGH (ref 65–99)
POTASSIUM: 3.8 mmol/L (ref 3.5–5.1)
SODIUM: 139 mmol/L (ref 135–145)

## 2017-01-26 LAB — LIPID PANEL
CHOL/HDL RATIO: 3.8 ratio
Cholesterol: 180 mg/dL (ref 0–200)
HDL: 48 mg/dL (ref >40)
LDL CALC: 114 mg/dL — AB (ref 0–99)
Triglycerides: 89 mg/dL (ref <150)
VLDL: 18 mg/dL (ref 0–40)

## 2017-01-26 MED ORDER — ATORVASTATIN CALCIUM 20 MG PO TABS
20.0000 mg | ORAL_TABLET | Freq: Every day | ORAL | Status: DC
  Administered 2017-01-26 – 2017-01-27 (×2): 20 mg via ORAL
  Filled 2017-01-26 (×2): qty 1

## 2017-01-26 NOTE — Progress Notes (Signed)
PROGRESS NOTE    Andrea [name]  OZD:[id number] DOB: 11/26/1925 DOA: 01/25/2017 [facility]    Brief Narrative:  83 year old female who presented with left hand paresthesias. Patient has significant past medical history for hypertension, hypothyroidism, osteoarthritis, who developed acute weakness and numbness on her left upper extremity, more prominent on her left hand. Apparently she developed acute weakness on her left hand, unable to to hold objects due to loss of grip strength, associated left lower extremity weakness. EMS was called, and patient was brought into the hospital. On the initial physical examination blood pressure 209/96, 181/93, heart rate 73, respiratory rate 13, oxygen saturation 99%. Mucous membranes were moist, lungs were clear to auscultation bilaterally, no wheezing or rhonchi, heart S1-S2 present rhythmic, no gallops, +2/6 systolic murmur, abdomen soft nontender, no lower extremity edema, positive hemiparesis and left side 4 out of 5 with significant decrease drip on the left. Sodium 146, potassium 4.0, chloride 108, bicarbonate 28, glucose 119, BUN 20, creatinine 0.8, troponin less than 0.03, white count 8.9, hemoglobin 15.2, hematocrit 44.6, platelets 368. Urinalysis negative for infection, head CT with no acute changes, chest x-ray with right rotation, no infiltrates, effusions or pneumothorax. TSH 31.5, EKG normal sinus rhythm with premature atrial complexes, normal axis, normal intervals.  The patient was admitted to hospital with working diagnosis of focal neurologic deficit, rule out cluster ischemic attack, complicated by hypothyroidism.    Assessment & Plan:   Principal Problem:   TIA (transient ischemic attack) Active Problems:   Hypothyroidism   Hypertension   Hypernatremia   CVA (cerebral vascular accident) (HCC)  1. Transitory ischemic attack/ ro CVA. Improving focal neuro deficit, will continue aspirin for antiplatelet therapy and  will start patient on high potency statin, LDH at 114 with HDL at 48. Follow with MRI report. Physical therapy evaluation with recommendations for inpatient rehab evaluation.   2. Hypothyroidism.  Asymptomatic, will continue current dose of levothyroxine, will need thyroid function teste in 3 weeks. Take levothyroxine with empty stomach.  3. Hypertension. Resume diuretic therapy per home regimen, blood pressure 120 to 150 systolic.   4. Severe osteoarthritis. No significant pain, but affecting function, will continue physical therapy.  DVT prophylaxis: enoxaparin  Code Status: full Family Communication:  Disposition Plan: home   Consultants:     Procedures:     Antimicrobials:       Subjective: Right upper extremity weakness improving, no headache, nausea or vomiting, has been able to work with physical therapy.   Objective: Vitals:   01/25/17 0415 01/25/17 0830 01/26/17 0239 01/26/17 0639  BP:   120/66 105/86  Pulse:   63 60  Resp:   17 18  Temp:   97.6 F (36.4 C) 97.6 F (36.4 C)  TempSrc:   Oral Oral  SpO2:   95% 98%  Weight: 80.7 kg (178 lb) 79.4 kg (175 lb)    Height: 5\' 4"  (1.626 m) 5\' 5"  (1.651 m)      Intake/Output Summary (Last 24 hours) at 01/26/17 1107 Last data filed at 01/26/17 0346  Gross per 24 hour  Intake              600 ml  Output              430 ml  Net              170 ml   Filed Weights   01/25/17 0415 01/25/17 0830  Weight: 80.7 kg (178 lb) 79.4 kg (175  lb)    Examination:   General: Not in pain or dyspnea, deconditioned Neurology: Awake and alert, non focal, improved hand grip on the right, 4-5/5, no other focal findings.  E ENT: mild pallor, no icterus, oral mucosa moist Cardiovascular: No JVD. S1-S2 present, rhythmic, no gallops, rubs, or murmurs. No lower extremity edema. Pulmonary: vesicular breath sounds bilaterally, adequate air movement, no wheezing, rhonchi or rales. Gastrointestinal. Abdomen flat, no  organomegaly, non tender, no rebound or guarding Skin. No rashes Musculoskeletal: deformities on the distal interphalangeal joints and knees, bilaterally.      Data Reviewed: I have personally reviewed following labs and imaging studies  CBC:  Recent Labs Lab 01/25/17 0436  WBC 8.9  NEUTROABS 6.3  HGB 15.2*  HCT 44.6  MCV 94.5  PLT 368   Basic Metabolic Panel:  Recent Labs Lab 01/25/17 0436 01/26/17 0546  NA 146* 139  K 4.0 3.8  CL 108 107  CO2 28 25  GLUCOSE 119* 118*  BUN 20 18  CREATININE 0.98 0.99  CALCIUM 10.1 9.1   GFR: Estimated Creatinine Clearance: 40.1 mL/min (by C-G formula based on SCr of 0.99 mg/dL). Liver Function Tests: No results for input(s): AST, ALT, ALKPHOS, BILITOT, PROT, ALBUMIN in the last 168 hours. No results for input(s): LIPASE, AMYLASE in the last 168 hours. No results for input(s): AMMONIA in the last 168 hours. Coagulation Profile: No results for input(s): INR, PROTIME in the last 168 hours. Cardiac Enzymes:  Recent Labs Lab 01/25/17 0436  TROPONINI <0.03   BNP (last 3 results) No results for input(s): PROBNP in the last 8760 hours. HbA1C: No results for input(s): HGBA1C in the last 72 hours. CBG: No results for input(s): GLUCAP in the last 168 hours. Lipid Profile:  Recent Labs  01/26/17 0546  CHOL 180  HDL 48  LDLCALC 114*  TRIG 89  CHOLHDL 3.8   Thyroid Function Tests:  Recent Labs  01/25/17 0436  TSH 31.569*   Anemia Panel: No results for input(s): VITAMINB12, FOLATE, FERRITIN, TIBC, IRON, RETICCTPCT in the last 72 hours.    Radiology Studies: I have reviewed all of the imaging during this hospital visit personally     Scheduled Meds: . aspirin EC  81 mg Oral Daily  . enoxaparin (LOVENOX) injection  40 mg Subcutaneous Q24H  . famotidine  20 mg Oral BID  . levothyroxine  125 mcg Oral QAC breakfast  . triamterene-hydrochlorothiazide  1 tablet Oral Daily   Continuous Infusions:   LOS: 1 day          Mauricio Annett Gula, MD [facility] Pager [phone]

## 2017-01-26 NOTE — Progress Notes (Addendum)
Physical Therapy Treatment Patient Details Name: Andrea [name] Uyeno MRN: [id number] DOB: 02/14/1926 Today's Date: 01/26/2017    History of Present Illness  Andrea [name] Sieler is a 83 y.o. female with medical history significant of hypertension, hypothyroidism, osteoarthritis, UTIs who is being brought via EMS to the hospital after she developed weakness and numbness of her left upper extremity, particularly her hand. She mentions that she was walking across her bedroom to get into bed and was holding her cell phone. Then she dropped it unintentionally and when she tried up picked up with her left hand noticed a lot of difficulty. She also developed left lower extremity weakness. She mentions that she has had 2 similar episodes like this when she has developed UTIs and usually they resolve with antibiotics.     PT Comments    Pt has improved control and use of Lt UE and LE today.  Treatment was cut short due to pt going for MRI. Pt still has difficulty putting full weight on LT LE and needs mod assist to come from sit to stand.  Pt static standing balance is fair (-).  Transfers with max assist.  Pt is 83 yo and lives with her daughter but her daughter is in poor health.  Pt is very cooperative and wants to be as I as possible.  Plan for CIR is still appropriate.    Follow Up Recommendations   CIR     Equipment Recommendations   none    Recommendations for Other Services  none     Precautions / Restrictions Precautions Precautions: Fall Restrictions Weight Bearing Restrictions: No    Mobility  Bed Mobility Overal bed mobility: Needs Assistance Bed Mobility: Supine to Sit;Sit to Supine     Supine to sit: Min assist Sit to supine: Min assist      Transfers Overall transfer level: Needs assistance Equipment used: Rolling walker (2 wheeled) Transfers: Sit to/from [employer]Stand;Stand Pivot Transfers Sit to Stand: Mod assist Stand pivot transfers: Max assist           Ambulation/Gait Ambulation/Gait assistance:  (unable )              Balance Overall balance assessment: Needs assistance Sitting-balance support: No upper extremity supported Sitting balance-Leahy Scale: Good   Postural control: Posterior lean Standing balance support: Bilateral upper extremity supported Standing balance-Leahy Scale: Fair               High level balance activites: Side stepping              Cognition  WNL                                            Exercises General Exercises - Upper Extremity Shoulder Flexion: AROM;Left;Supine;10 reps Shoulder Extension: Right;10 reps;AROM Elbow Flexion: AROM;Left;10 reps Elbow Extension: AROM;Left;10 reps General Exercises - Lower Extremity Ankle Circles/Pumps: AROM;10 reps Gluteal Sets: 10 reps (Bridges) Hip ABduction/ADduction: AROM;Left;10 reps Straight Leg Raises: AROM;Left     Prior Function  I with cane          PT Goals (current goals can now be found in the care plan section) Acute Rehab PT Goals PT Goal Formulation: With patient Potential to Achieve Goals: Good Progress towards PT goals: Progressing toward goals    Frequency    7X/week7x week      PT Plan Continue with functional mobility  End of Session  PT transferring to radiology for MRI   Patient left: in bed Nurse Communication: Mobility status PT Visit Diagnosis: Unsteadiness on feet (R26.81);Other abnormalities of gait and mobility (R26.89);Muscle weakness (generalized) (M62.81);Difficulty in walking, not elsewhere classified (R26.2);History of falling (Z91.81)     Time: 1610-9604 PT Time Calculation (min) (ACUTE ONLY): 29 min  Charges:  $Therapeutic Exercise: 8-22 mins $Therapeutic Activity: 8-22 mins                       Virgina Organ, PT CLT [phone] 01/26/2017, 10:08 AM

## 2017-01-27 DIAGNOSIS — I639 Cerebral infarction, unspecified: Principal | ICD-10-CM

## 2017-01-27 LAB — HEMOGLOBIN A1C
Hgb A1c MFr Bld: 5.8 % — ABNORMAL HIGH (ref 4.8–5.6)
Mean Plasma Glucose: 120 mg/dL

## 2017-01-27 NOTE — Progress Notes (Signed)
Patient c/o pain in left leg.  Patient states that pain has been the same since before she was admitted.  Patient states that pain is not severe, but noticeable.

## 2017-01-27 NOTE — Progress Notes (Signed)
OT Cancellation Note  Patient Details Name: Andrea [name] MRN: [id number] DOB: 11/02/1925   Cancelled Treatment:    Reason Eval/Treat Not Completed: Patient at procedure or test/ unavailable (with RN) Therapist will attempt evaluation later this date or 01/28/17 as schedule allows.    Shirlean MylarBethany H. Murray, MHA, OTR/L [phone]  01/27/2017, 9:44 AM

## 2017-01-27 NOTE — Progress Notes (Signed)
I met with pt and her neighbor, Tommy/Herman, at bedside. We discussed goals and expectations of an inpatient acute rehabilitation admission at [facility]. She prefers an inpt rehab admit rather than SNF. I will clarify bed availability over the next 24 to 48 hrs and make RN CM aware tomorrow. [phone]

## 2017-01-27 NOTE — Progress Notes (Signed)
Physical Therapy Treatment Patient Details Name: Andrea Hatchetancy Dacquisto MRN: [id number] DOB: 12/31/1925 Today's Date: 01/27/2017    History of Present Illness  Andrea [name] is a 83 y.o. female with medical history significant of hypertension, hypothyroidism, osteoarthritis, UTIs who is being brought via EMS to the hospital after she developed weakness and numbness of her left upper extremity, particularly her hand. She mentions that she was walking across her bedroom to get into bed and was holding her cell phone. Then she dropped it unintentionally and when she tried up picked up with her left hand noticed a lot of difficulty. She also developed left lower extremity weakness. She mentions that she has had 2 similar episodes like this when she has developed UTIs and usually they resolve with antibiotics.     PT Comments    Patient demonstrates increased left side strength for transfers and gait training, able to ambulate in hallway followed by wheelchair for safety, incoordination of LLE when taking side steps and occasionally when stepping forward, and tolerated sitting up in chair after therapy.  Patient will benefit from continued physical therapy in hospital and recommended venue below to increase strength, balance, endurance for safe ADLs and gait.    Follow Up Recommendations  CIR     Equipment Recommendations  None recommended by PT    Recommendations for Other Services Rehab consult     Precautions / Restrictions Precautions Precautions: Fall Restrictions Weight Bearing Restrictions: No    Mobility  Bed Mobility Overal bed mobility: Needs Assistance Bed Mobility: Supine to Sit;Sit to Supine     Supine to sit: Min assist Sit to supine: Min assist   General bed mobility comments: had difficulty propping up on  LLE due to weakness  Transfers Overall transfer level: Needs assistance Equipment used: Rolling walker (2 wheeled) Transfers: Sit to/from [employer]Stand;Stand Pivot  Transfers Sit to Stand: Min assist;Mod assist Stand pivot transfers: Mod assist;Min assist       General transfer comment: has most diffiuclty when side stepping with LLE due to incoordination   Ambulation/Gait Ambulation/Gait assistance: Mod assist;Min assist Ambulation Distance (Feet): 25 Feet Assistive device: Rolling walker (2 wheeled) Gait Pattern/deviations: Decreased step length - left;Decreased stance time - left;Decreased stride length   Gait velocity interpretation: Below normal speed for age/gender General Gait Details: Patient demonstrates improvement for advancing LLE when taking steps in hallway followed with wheelchair, mild incoordination noted with slightly decreased left heelstrike   Stairs            Wheelchair Mobility    Modified Rankin (Stroke Patients Only)       Balance Overall balance assessment: Needs assistance Sitting-balance support: No upper extremity supported Sitting balance-Leahy Scale: Good     Standing balance support: Bilateral upper extremity supported;During functional activity Standing balance-Leahy Scale: Fair                              Cognition Arousal/Alertness: Awake/alert Behavior During Therapy: WFL for tasks assessed/performed Overall Cognitive Status: Within Functional Limits for tasks assessed                                        Exercises General Exercises - Lower Extremity Long Arc Quad: Seated;AROM;Both;10 reps;Strengthening Hip Flexion/Marching: Seated;AROM;Strengthening;Both;10 reps Toe Raises: Seated;AROM;Strengthening;Both;10 reps Heel Raises: Seated;AROM;Strengthening;Both;10 reps    General Comments  Pertinent Vitals/Pain Pain Assessment: No/denies pain    Home Living                      Prior Function            PT Goals (current goals can now be found in the care plan section) Acute Rehab PT Goals Patient Stated Goal: to be walking again   PT Goal Formulation: With patient Potential to Achieve Goals: Good Progress towards PT goals: Progressing toward goals    Frequency    7X/week      PT Plan Current plan remains appropriate    Co-evaluation              AM-PAC PT "6 Clicks" Daily Activity  Outcome Measure  Difficulty turning over in bed (including adjusting bedclothes, sheets and blankets)?: A Little Difficulty moving from lying on back to sitting on the side of the bed? : A Little Difficulty sitting down on and standing up from a chair with arms (e.g., wheelchair, bedside commode, etc,.)?: A Little Help needed moving to and from a bed to chair (including a wheelchair)?: A Lot Help needed walking in hospital room?: A Lot Help needed climbing 3-5 steps with a railing? : Total 6 Click Score: 14    End of Session Equipment Utilized During Treatment: Gait belt Activity Tolerance: Patient tolerated treatment well Patient left: in chair;with call bell/phone within reach;with family/visitor present Nurse Communication: Mobility status PT Visit Diagnosis: Unsteadiness on feet (R26.81);Other abnormalities of gait and mobility (R26.89);Muscle weakness (generalized) (M62.81);Difficulty in walking, not elsewhere classified (R26.2);History of falling (Z91.81)     Time: 4540-9811 PT Time Calculation (min) (ACUTE ONLY): 38 min  Charges:  $Gait Training: 8-22 mins $Therapeutic Activity: 23-37 mins                    G Codes:       2:44 PM, 2017-01-31 Ocie Bob, MPT Physical Therapist with [facility] [phone] office [phone] mobile phone

## 2017-01-27 NOTE — Evaluation (Signed)
Speech Language Pathology Evaluation Patient Details Name: Andrea [name] MRN: [id number] DOB: [birth year] Today's Date: 01/27/2017 Time: 4098-1191 SLP Time Calculation (min) (ACUTE ONLY): 25 min  Problem List:  Patient Active Problem List   Diagnosis Date Noted  . TIA (transient ischemic attack) 01/25/2017  . Hypernatremia 01/25/2017  . CVA (cerebral vascular accident) (HCC) 01/25/2017  . Intertrochanteric fracture of left hip (HCC) 09/01/2014  . Hypothyroidism 09/01/2014  . Hypertension 09/01/2014  . Closed left hip fracture (HCC) 09/01/2014   Past Medical History:  Past Medical History:  Diagnosis Date  . Hypertension   . Thyroid disease    Past Surgical History:  Past Surgical History:  Procedure Laterality Date  . ABDOMINAL HYSTERECTOMY    . ORIF HIP FRACTURE Left 09/02/2014   Procedure: OPEN REDUCTION INTERNAL FIXATION LEFT HIP FRACTURE;  Surgeon: Darreld Mclean, MD;  Location: [facility];  Service: Orthopedics;  Laterality: Left;   HPI:  83 year old female who presented with left hand paresthesias. Patient has significant past medical history for hypertension, hypothyroidism, osteoarthritis, who developed acute weakness and numbness on her left upper extremity, more prominent on her left hand. Apparently she developed acute weakness on her left hand, unable to to hold objects due to loss of grip strength, associated left lower extremity weakness. EMS was called, and patient was brought into the hospital. On the initial physical examination blood pressure 209/96, 181/93, heart rate 73, respiratory rate 13, oxygen saturation 99%. Mucous membranes were moist, lungs were clear to auscultation bilaterally, no wheezing or rhonchi, heart S1-S2 present rhythmic, no gallops, +2/6 systolic murmur, abdomen soft nontender, no lower extremity edema, positive hemiparesis and left side 4 out of 5 with significant decrease drip on the left. Sodium 146, potassium 4.0, chloride 108, bicarbonate 28,  glucose 119, BUN 20, creatinine 0.8, troponin less than 0.03, white count 8.9, hemoglobin 15.2, hematocrit 44.6, platelets 368. Urinalysis negative for infection, head CT with no acute changes, chest x-ray with right rotation, no infiltrates, effusions or pneumothorax. TSH 31.5, EKG normal sinus rhythm with premature atrial complexes, normal axis, normal intervals. MRI shows acute right thalamic infarct. SLE ordered as part of stroke protocol.   Assessment / Plan / Recommendation Clinical Impression  Pt reports no changes in cognition, speech, language, or swallowing. She does endorse weakness in left hand and leg. Pt lives with daughter, but states she was independent with all tasks (driving, shopping, finances, medication management, etc) prior to admission. Cognitive linguistic skills appear WFL except for mild working memory deficits. Pt may benefit from SLP follow up at next venue for mild cognitive impairment. No further acute SLP needs indicated at this time. SLP will sign off. Above to RN.    SLP Assessment  SLP Recommendation/Assessment: All further Speech Lanaguage Pathology  needs can be addressed in the next venue of care SLP Visit Diagnosis: Cognitive communication deficit (R41.841)    Follow Up Recommendations   (SLP services at CIR if Pt goes per PT rec)    Frequency and Duration           SLP Evaluation Cognition  Overall Cognitive Status: Within Functional Limits for tasks assessed Arousal/Alertness: Awake/alert Orientation Level: Oriented to person;Oriented to place;Oriented to situation;Disoriented to time Memory: Impaired Memory Impairment: Retrieval deficit Awareness: Appears intact Problem Solving: Appears intact Safety/Judgment: Appears intact Comments: Pt stated that she would call for help to reach cane for ambulation       Comprehension  Auditory Comprehension Overall Auditory Comprehension: Appears within functional limits for tasks assessed  Yes/No  Questions: Within Functional Limits Commands: Within Functional Limits Conversation: Complex Visual Recognition/Discrimination Discrimination: Within Function Limits Reading Comprehension Reading Status: Not tested (Pt requested a newspaper, which SLP left in room)    Expression Expression Primary Mode of Expression: Verbal Verbal Expression Overall Verbal Expression: Appears within functional limits for tasks assessed Initiation: No impairment Automatic Speech: Name;Social Response;Day of week Level of Generative/Spontaneous Verbalization: Conversation Repetition: No impairment Naming: No impairment Pragmatics: No impairment Non-Verbal Means of Communication: Not applicable Written Expression Dominant Hand: Right Written Expression: Not tested   Oral / Motor  Oral Motor/Sensory Function Overall Oral Motor/Sensory Function: Within functional limits Motor Speech Overall Motor Speech: Appears within functional limits for tasks assessed Respiration: Within functional limits Phonation: Normal Resonance: Within functional limits Articulation: Within functional limitis Intelligibility: Intelligible Motor Planning: Witnin functional limits Motor Speech Errors: Not applicable   Thank you,  Havery MorosDabney Lari Linson, CCC-SLP [phone]                     Philipe Laswell 01/27/2017, 3:07 PM

## 2017-01-27 NOTE — Progress Notes (Signed)
PROGRESS NOTE    Andrea [name]  ZOX:[id number] DOB: 05/21/1925 DOA: 01/25/2017 [facility]CP: [facility]    Brief Narrative:  83 year old female who presented with left hand paresthesias. Patient has significant past medical history for hypertension, hypothyroidism, osteoarthritis, who developed acute weakness and numbness on her left upper extremity, more prominent on her left hand. Apparently she developed acute weakness on her left hand, unable to to hold objects due to loss of grip strength, associated left lower extremity weakness. EMS was called, and patient was brought into the hospital. On the initial physical examination blood pressure 209/96, 181/93, heart rate 73, respiratory rate 13, oxygen saturation 99%. Mucous membranes were moist, lungs were clear to auscultation bilaterally, no wheezing or rhonchi, heart S1-S2 present rhythmic, no gallops, +2/6 systolic murmur, abdomen soft nontender, no lower extremity edema, positive hemiparesis and left side 4 out of 5 with significant decrease drip on the left. Sodium 146, potassium 4.0, chloride 108, bicarbonate 28, glucose 119, BUN 20, creatinine 0.8, troponin less than 0.03, white count 8.9, hemoglobin 15.2, hematocrit 44.6, platelets 368. Urinalysis negative for infection, head CT with no acute changes, chest x-ray with right rotation, no infiltrates, effusions or pneumothorax. TSH 31.5, EKG normal sinus rhythm with premature atrial complexes, normal axis, normal intervals.  The patient was admitted to hospital with working diagnosis of focal neurologic deficit, rule out cluster ischemic attack, complicated by hypothyroidism.    Assessment & Plan:   Principal Problem:   TIA (transient ischemic attack) Active Problems:   Hypothyroidism   Hypertension   Hypernatremia   CVA (cerebral vascular accident) (HCC)  1. Acute ischemic right thalamic CVA. Improving but persistant focal neuro deficit, patient needs significant assistance  for transfering, follow with OT. MRI resulted positive for CVA with acute thalamic infarct, severe right A1 stenosis, and mild to moderate ICA narrowing. Will continue antiplatelet therapy, and high potency statin. Carotids with no significant stenosis, echocardiogram with no embolic phenomena.   2. Hypothyroidism.  Continue levothyroxine. Follow tyroid function testing as outpatient.   3. Hypertension. Blood pressure controlled will continue traimterene with hctz, systolic 120 to 138.   4. Severe osteoarthritis. Continue pain control and physical therapy.   DVT prophylaxis:enoxaparin Code Status:full Family Communication: Disposition Plan:home   Consultants:    Procedures:    Antimicrobials:      Subjective: Patient feeling better, persistent weakness on the left hand, no chest pain, no dyspnea, no nausea or vomiting.   Objective: Vitals:   01/25/17 0830 01/27/17 0229 01/27/17 0629 01/27/17 0759  BP:  (!) 110/54 124/67 137/64  Pulse:  69 63 70  Resp:  17 18 18   Temp:  97.7 F (36.5 C) 97.7 F (36.5 C) 98.2 F (36.8 C)  TempSrc:  Oral Oral Oral  SpO2:  96% 95% 96%  Weight: 79.4 kg (175 lb)     Height: 5\' 5"  (1.651 m)       Intake/Output Summary (Last 24 hours) at 01/27/17 1002 Last data filed at 01/27/17 0900  Gross per 24 hour  Intake              600 ml  Output             1550 ml  Net             -950 ml   Filed Weights   01/25/17 0415 01/25/17 0830  Weight: 80.7 kg (178 lb) 79.4 kg (175 lb)    Examination:   General: Not in pain or  dyspnea Neurology: Awake and alert, decreased hand grip on the left hand 4-5/5. Per Pt transfers with max assist.  E ENT: mild pallor, no icterus, oral mucosa moist Cardiovascular: No JVD. S1-S2 present, rhythmic, no gallops, rubs, or murmurs. No lower extremity edema. Pulmonary: vesicular breath sounds bilaterally, adequate air movement, no wheezing, rhonchi or rales. Gastrointestinal. Abdomen flat, no  organomegaly, non tender, no rebound or guarding Skin. No rashes Musculoskeletal: no joint deformities     Data Reviewed: I have personally reviewed following labs and imaging studies  CBC:  Recent Labs Lab 01/25/17 0436  WBC 8.9  NEUTROABS 6.3  HGB 15.2*  HCT 44.6  MCV 94.5  PLT 368   Basic Metabolic Panel:  Recent Labs Lab 01/25/17 0436 01/26/17 0546  NA 146* 139  K 4.0 3.8  CL 108 107  CO2 28 25  GLUCOSE 119* 118*  BUN 20 18  CREATININE 0.98 0.99  CALCIUM 10.1 9.1   GFR: Estimated Creatinine Clearance: 40.1 mL/min (by C-G formula based on SCr of 0.99 mg/dL). Liver Function Tests: No results for input(s): AST, ALT, ALKPHOS, BILITOT, PROT, ALBUMIN in the last 168 hours. No results for input(s): LIPASE, AMYLASE in the last 168 hours. No results for input(s): AMMONIA in the last 168 hours. Coagulation Profile: No results for input(s): INR, PROTIME in the last 168 hours. Cardiac Enzymes:  Recent Labs Lab 01/25/17 0436  TROPONINI <0.03   BNP (last 3 results) No results for input(s): PROBNP in the last 8760 hours. HbA1C: No results for input(s): HGBA1C in the last 72 hours. CBG: No results for input(s): GLUCAP in the last 168 hours. Lipid Profile:  Recent Labs  01/26/17 0546  CHOL 180  HDL 48  LDLCALC 114*  TRIG 89  CHOLHDL 3.8   Thyroid Function Tests:  Recent Labs  01/25/17 0436  TSH 31.569*   Anemia Panel: No results for input(s): VITAMINB12, FOLATE, FERRITIN, TIBC, IRON, RETICCTPCT in the last 72 hours.    Radiology Studies: I have reviewed all of the imaging during this hospital visit personally     Scheduled Meds: . aspirin EC  81 mg Oral Daily  . atorvastatin  20 mg Oral q1800  . enoxaparin (LOVENOX) injection  40 mg Subcutaneous Q24H  . famotidine  20 mg Oral BID  . levothyroxine  125 mcg Oral QAC breakfast  . triamterene-hydrochlorothiazide  1 tablet Oral Daily   Continuous Infusions:   LOS: 2 days         Abena Erdman Annett Gula, MD [facility] Pager [phone]

## 2017-01-27 NOTE — Progress Notes (Signed)
Rehab Admissions Coordinator Note:  Patient was screened by Clois DupesBoyette, Tanishi Nault Godwin for appropriateness for an Inpatient Acute Rehab Consult per PT recommendation on 01/25/17. I spoke with pt by phone and she would like to be considered for a possible admission. I have made arrangements to meet with pt at 4 pm today onsite for assessment. I have left a message with RN CM Aviva SignsSharley, of my intent for onsite assessment today. Clois Dupes.  Kyrstyn Greear Godwin 01/27/2017, 12:46 PM  I can be reached at [phone].

## 2017-01-28 ENCOUNTER — Inpatient Hospital Stay (HOSPITAL_COMMUNITY)
Admission: RE | Admit: 2017-01-28 | Discharge: 2017-02-07 | DRG: 057 | Disposition: A | Discharge status: Home Health | Payer: Medicare Other | Source: Other Acute Inpatient Hospital | Attending: Physical Medicine & Rehabilitation | Admitting: Physical Medicine & Rehabilitation

## 2017-01-28 DIAGNOSIS — I69354 Hemiplegia and hemiparesis following cerebral infarction affecting left non-dominant side: Principal | ICD-10-CM

## 2017-01-28 DIAGNOSIS — K5900 Constipation, unspecified: Secondary | ICD-10-CM | POA: Diagnosis present

## 2017-01-28 DIAGNOSIS — Z87440 Personal history of urinary (tract) infections: Secondary | ICD-10-CM

## 2017-01-28 DIAGNOSIS — G8194 Hemiplegia, unspecified affecting left nondominant side: Secondary | ICD-10-CM | POA: Diagnosis not present

## 2017-01-28 DIAGNOSIS — E038 Other specified hypothyroidism: Secondary | ICD-10-CM | POA: Diagnosis not present

## 2017-01-28 DIAGNOSIS — I6381 Other cerebral infarction due to occlusion or stenosis of small artery: Secondary | ICD-10-CM | POA: Diagnosis present

## 2017-01-28 DIAGNOSIS — Z79890 Hormone replacement therapy (postmenopausal): Secondary | ICD-10-CM

## 2017-01-28 DIAGNOSIS — E785 Hyperlipidemia, unspecified: Secondary | ICD-10-CM | POA: Diagnosis present

## 2017-01-28 DIAGNOSIS — E039 Hypothyroidism, unspecified: Secondary | ICD-10-CM | POA: Diagnosis present

## 2017-01-28 DIAGNOSIS — I639 Cerebral infarction, unspecified: Secondary | ICD-10-CM | POA: Diagnosis not present

## 2017-01-28 DIAGNOSIS — I10 Essential (primary) hypertension: Secondary | ICD-10-CM | POA: Diagnosis present

## 2017-01-28 DIAGNOSIS — M1710 Unilateral primary osteoarthritis, unspecified knee: Secondary | ICD-10-CM | POA: Diagnosis present

## 2017-01-28 DIAGNOSIS — Z8249 Family history of ischemic heart disease and other diseases of the circulatory system: Secondary | ICD-10-CM | POA: Diagnosis not present

## 2017-01-28 DIAGNOSIS — R944 Abnormal results of kidney function studies: Secondary | ICD-10-CM | POA: Diagnosis present

## 2017-01-28 DIAGNOSIS — M170 Bilateral primary osteoarthritis of knee: Secondary | ICD-10-CM | POA: Diagnosis not present

## 2017-01-28 LAB — CREATININE, SERUM
CREATININE: 1.19 mg/dL — AB (ref 0.44–1.00)
GFR calc Af Amer: 46 mL/min — ABNORMAL LOW (ref >60)
GFR, EST NON AFRICAN AMERICAN: 39 mL/min — AB (ref >60)

## 2017-01-28 LAB — CBC
HCT: 46.1 % — ABNORMAL HIGH (ref 36.0–46.0)
HEMOGLOBIN: 15.9 g/dL — AB (ref 12.0–15.0)
MCH: 32.3 pg (ref 26.0–34.0)
MCHC: 34.5 g/dL (ref 30.0–36.0)
MCV: 93.7 fL (ref 78.0–100.0)
PLATELETS: 377 10*3/uL (ref 150–400)
RBC: 4.92 MIL/uL (ref 3.87–5.11)
RDW: 13.9 % (ref 11.5–15.5)
WBC: 9.6 10*3/uL (ref 4.0–10.5)

## 2017-01-28 LAB — T4, FREE: FREE T4: 0.83 ng/dL (ref 0.61–1.12)

## 2017-01-28 MED ORDER — ACETAMINOPHEN 325 MG PO TABS
650.0000 mg | ORAL_TABLET | ORAL | Status: DC | PRN
  Administered 2017-01-29 – 2017-02-02 (×3): 650 mg via ORAL
  Filled 2017-01-28 (×3): qty 2

## 2017-01-28 MED ORDER — TRIAMTERENE-HCTZ 37.5-25 MG PO TABS
1.0000 | ORAL_TABLET | Freq: Every day | ORAL | Status: DC
  Administered 2017-01-29: 1 via ORAL
  Filled 2017-01-28 (×2): qty 1

## 2017-01-28 MED ORDER — ATORVASTATIN CALCIUM 40 MG PO TABS: 40.0000 mg | ORAL_TABLET | Freq: Every day | ORAL | 0 refills | Status: DC

## 2017-01-28 MED ORDER — LEVOTHYROXINE SODIUM 25 MCG PO TABS
125.0000 ug | ORAL_TABLET | Freq: Every day | ORAL | Status: DC
  Administered 2017-01-29 – 2017-02-07 (×9): 125 ug via ORAL
  Filled 2017-01-28 (×10): qty 1

## 2017-01-28 MED ORDER — ATORVASTATIN CALCIUM 20 MG PO TABS
20.0000 mg | ORAL_TABLET | Freq: Every day | ORAL | Status: DC
  Administered 2017-01-28 – 2017-02-06 (×10): 20 mg via ORAL
  Filled 2017-01-28 (×10): qty 1

## 2017-01-28 MED ORDER — ACETAMINOPHEN 160 MG/5ML PO SOLN: 650.0000 mg | ORAL | Status: DC | PRN

## 2017-01-28 MED ORDER — SORBITOL 70 % SOLN
30.0000 mL | Freq: Every day | Status: DC | PRN
  Administered 2017-01-29 – 2017-02-06 (×4): 30 mL via ORAL
  Filled 2017-01-28 (×4): qty 30

## 2017-01-28 MED ORDER — ONDANSETRON HCL 4 MG/2ML IJ SOLN: 4.0000 mg | Freq: Four times a day (QID) | INTRAMUSCULAR | Status: DC | PRN

## 2017-01-28 MED ORDER — ASPIRIN EC 81 MG PO TBEC
81.0000 mg | DELAYED_RELEASE_TABLET | Freq: Every day | ORAL | Status: DC
  Administered 2017-01-29 – 2017-02-07 (×10): 81 mg via ORAL
  Filled 2017-01-28 (×11): qty 1

## 2017-01-28 MED ORDER — ACETAMINOPHEN 325 MG PO TABS: 650.0000 mg | ORAL_TABLET | ORAL | 0 refills | Status: DC | PRN

## 2017-01-28 MED ORDER — FAMOTIDINE 20 MG PO TABS
20.0000 mg | ORAL_TABLET | Freq: Two times a day (BID) | ORAL | Status: DC
  Administered 2017-01-28 – 2017-02-07 (×20): 20 mg via ORAL
  Filled 2017-01-28 (×20): qty 1

## 2017-01-28 MED ORDER — ACETAMINOPHEN 325 MG PO TABS: 650.0000 mg | ORAL_TABLET | ORAL | Status: DC | PRN

## 2017-01-28 MED ORDER — ACETAMINOPHEN 650 MG RE SUPP: 650.0000 mg | RECTAL | Status: DC | PRN

## 2017-01-28 MED ORDER — ENOXAPARIN SODIUM 40 MG/0.4ML ~~LOC~~ SOLN
40.0000 mg | SUBCUTANEOUS | Status: DC
  Administered 2017-01-29 – 2017-02-07 (×10): 40 mg via SUBCUTANEOUS
  Filled 2017-01-28 (×10): qty 0.4

## 2017-01-28 MED ORDER — HYDROCODONE-ACETAMINOPHEN 7.5-325 MG PO TABS: 1.0000 | ORAL_TABLET | ORAL | 0 refills | Status: DC | PRN

## 2017-01-28 MED ORDER — ONDANSETRON HCL 4 MG PO TABS: 4.0000 mg | ORAL_TABLET | Freq: Four times a day (QID) | ORAL | Status: DC | PRN

## 2017-01-28 MED ORDER — ACETAMINOPHEN 160 MG/5ML PO SOLN
650.0000 mg | ORAL | Status: DC | PRN
Start: 2017-01-28 — End: 2017-02-07

## 2017-01-28 MED ORDER — ASPIRIN 81 MG PO TBEC: 81.0000 mg | DELAYED_RELEASE_TABLET | Freq: Every day | ORAL | 0 refills | Status: AC

## 2017-01-28 NOTE — Care Management Important Message (Signed)
Important Message  Patient Details  Name: Andrea [name] MRN: [id number] Date of Birth: 07/06/1925   Medicare Important Message Given:  Yes    [name], Melaina Howerton Demske, RN 01/28/2017, 10:28 AM

## 2017-01-28 NOTE — PMR Pre-admission (Signed)
[facility] PMR Admission Coordinator Pre-Admission Assessment  Patient: Andrea [name] is an 83 y.o., female MRN: [id number] DOB: Aug 02, 1925 Height: '5\' 5"'$  (165.1 cm) Weight: 79.4 kg (175 lb)  Insurance Information HMO:     PPO:      PCP:      IPA:      80/20: yes     OTHER: no HMO PRIMARY: Medicare a and b      Policy#: [id number]      Subscriber: pt Benefits:  Phone #: passport one online     Name: 01/27/17 Eff. Date: 09/29/1992     Deduct: $1340      Out of Pocket Max: none      Life Max: none CIR: 100%      SNF: 20 full days Outpatient: 80%     Co-Pay: 20% Home Health: 100%      Co-Pay: none DME: 80%     Co-Pay: 20% Providers: pt choice  SECONDARY: Medicaid [place] Access      Policy#: [id number]      Subscriber: pt  Medicaid Application Date:       Case Manager:  Disability Application Date:       Case Worker:   Emergency Contact Information Contact Information    Name Relation Home Work Mobile   [place] Friend [phone]     Kron,Robin Daughter [phone]        Current Medical History  Patient Admitting Diagnosis: right Thalamic Infarction  History of Present Illness: HPI: 83 year old right handed female with past medical history of hypertension, recurrent UTIs, hypothyroidism. left hip fracture with ORIF June 2016.  Presented 01/25/2017 with left-sided weakness and numbness. Blood pressure 209/96. Troponin negative. Cranial CT scan negative. Patient did not receive TPA. MRI showed acute right thalamic infarction. MRA with no large vessel occlusion. Carotid Doppler no ICA stenosis. Echocardiogram with ejection fraction 65% no wall motion abnormalities. Maintained on aspirin for CVA prophylaxis. Subcutaneous Lovenox for DVT prophylaxis. Elevated TSH 31.569 with free T4 and T3 pending. Patient currently remains on Synthroid and plans to follow-up thyroid panel in 2 weeks. Tolerating a regular diet consistency.   Patient's medical record from [facility] has been reviewed by the rehabilitation admission coordinator and physician. NIH Stroke scale:3 Glascow Coma Scale:n/a  Past Medical History  Past Medical History:  Diagnosis Date  . Hypertension   . Thyroid disease     Family History   family history includes CAD in her father; CAD (age of onset: 45) in her mother; Heart failure in her father.  Prior Rehab/Hospitalizations Has the patient had major surgery during 100 days prior to admission? No  Has been to [facility] in [place] after hip replacement 2 years ago for 30 days   Current Medications Tylenol Aspirin atorvastin Doxazosin norco Maxzide   Patients Current Diet:  Regular diet with thin liquids  Precautions / Restrictions Precautions Precautions: Fall Restrictions Weight Bearing Restrictions: No   Has the patient had 2 or more falls or a fall with injury in the past year?No  Prior Activity Level Community (5-7x/wk): Mod I with cane; drives, Indpendent with adls  Pt does own mowing, weed eating and tills in garden. 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 Likes to play the scratch offs lottery. Drives. Has a dog, Muffin.  Prior Functional Level Self Care: Did the patient need help bathing, dressing, using the toilet or eating?  Independent  Indoor Mobility: Did the patient need assistance with walking from room to room (with or  without device)? Independent  Stairs: Did the patient need assistance with internal or external stairs (with or without device)? Independent  Functional Cognition: Did the patient need help planning regular tasks such as shopping or remembering to take medications? Independent  Home Assistive Devices / Equipment Home Assistive Devices/Equipment: Cane (specify quad or straight) Home Equipment: Walker - 2 wheels, Cane - single point  Prior Device Use: Indicate devices/aids used by the patient prior to current illness, exacerbation or injury? cane   Prior Functional Level Current  Functional Level  Bed Mobility  Independent  Min assist   Transfers  Independent  Mod assist; during transfers, pt noted to lose her grip on the RW with left hand. Needs increased time for coordination. Decreased balance and weakness noted. Difficulty propping up on LLE due to weakness..   Mobility - Walk/Wheelchair  Mod Independent (used cane)  Mod assist. Min to mod assist 25 feet with RW.Decreased left heel stride.   Upper Body Dressing  Independent  Min assist; decreased coordination noted. Decreased strength left hand. Patient is right hand dominant.    Lower Body Dressing  Independent  Mod assist   Grooming  Independent  Min assist   Eating/Drinking  Independent  Min assist   Toilet Transfer  Independent  Mod assist   Bladder Continence   continent  incontinent; external catheter   Bowel Management  continent  LBM 01/25/17. 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 Very independent      Special needs/care consideration BiPAP/CPAP  N/a CPM  N/a Continuous Drip IV  N/a Dialysis  N/a Life Vest  N/a Oxygen  N/a Special Bed  N/a Trach Size n/a Wound Vac n/a Skin skin tear left lower leg with sponge dressing applied Bowel mgmt: LBM 10/27 prior to admit Bladder mgmt: incontinent; external catheter Diabetic mgmt  N/a  Previous Home Environment Living Arrangements:  (31 year old daughter, [name])  Lives With: Daughter Available Help at Discharge: Family, Available 24 hours/day Type of Home: Mobile home Home Layout: One level Home Access: Ramped entrance Entrance Stairs-Number of Steps: 3 Bathroom Shower/Tub: Chiropodist: Standard Bathroom Accessibility: Yes How  Accessible: Accessible via walker [place]: No  Discharge Living Setting Plans for Discharge Living Setting: Patient's home, Mobile Home Type of Home at Discharge: Mobile home Discharge Home Layout: One level Discharge Home Access: [place] entrance Discharge Bathroom Shower/Tub: [place] unit Discharge Bathroom Toilet: Standard Discharge Bathroom Accessibility: Yes How Accessible: Accessible via walker Does the patient have any problems obtaining your medications?: No  Social/Family/Support Systems Patient Roles: Parent Contact Information: Tommy, neighbor, is main contact Anticipated Caregiver: Konrad Dolores and Robin Anticipated Ambulance person Information: see above Ability/Limitations of Caregiver: Robin unreliable at times therefore Toomy is main contatc Caregiver Availability: 24/7 Discharge Plan Discussed with Primary Caregiver: Yes Is Caregiver In Agreement with Plan?: Yes Does Caregiver/Family have Issues with Lodging/Transportation while Pt is in Rehab?: No   Neighbor, Tommy/Herman, is primary contact. Pt's daughter, [name], lives with pt but has "health Issues". Neighbor states she takes too many pills and is unreliable therefore he lives 2 doors down and helps with whatever. Ms. Donice needs.  Goals/Additional Needs Patient/Family Goal for Rehab: Mod I to supervision with PT, OT and SLP Expected length of stay: ELOS 10- 14 days Pt/Family Agrees to Admission and willing to participate: Yes Program Orientation Provided & Reviewed with Pt/Caregiver Including Roles  & Responsibilities: Yes  Patient Condition: I have reviewed medical records from [facility] as well as met with patient and her neighbor at bedside. Patient will benefit form ongoing PT, OT< and SLP. She will receive a coordinated team approach during an Inpatient Acute Rehabilitation admission. She will receive 3 hours per day of therapy 5 days per week , Rehabilitation physician as well as Nursing  care. She is currently min to mod functionally with mobility and adls. We will admit to Acute Inpatient Rehabilitation today.  Preadmission Screen Completed By:  Cleatrice Burke, 01/28/2017 11:15 AM ______________________________________________________________________   Discussed status with Dr. Naaman Plummer  on  01/28/2017  at  1117 and received telephone approval for admission today.  Admission Coordinator:  Cleatrice Burke, time  [id number] Date  01/28/2017   Assessment/Plan: Diagnosis: right thalamic infarction 1. Does the need for close, 24 hr/day  Medical supervision in concert with the patient's rehab needs make it unreasonable for this patient to be served in a less intensive setting? Yes 2. Co-Morbidities requiring supervision/potential complications: htn, thyroid disease, post-stroke sequelae 3. Due to bladder management, bowel management, safety, skin/wound care, disease management, medication administration, pain management and patient education, does the patient require 24 hr/day rehab nursing? Yes 4. Does the patient require coordinated care of a physician, rehab nurse, PT (1-2 hrs/day, 5 days/week), OT (1-2 hrs/day, 5 days/week) and SLP (1-2 hrs/day, 5 days/week) to address physical and functional deficits in the context of the above medical diagnosis(es)? Yes Addressing deficits in the following areas: balance, endurance, locomotion, strength, transferring, bowel/bladder control, bathing, dressing, feeding, grooming, toileting, cognition, speech and psychosocial support 5. Can the patient actively participate in an intensive therapy program of at least 3 hrs of therapy 5 days a week? Yes 6. The potential for patient to make measurable gains while on inpatient rehab is excellent 7. Anticipated functional outcomes upon discharge from inpatients are: modified independent and supervision PT, modified independent and supervision OT, modified independent and supervision  SLP 8. Estimated rehab length of stay to reach the above functional goals is: 10-14 days 9. Does the patient have adequate social supports to accommodate these discharge functional goals? Yes 10. Anticipated D/C setting: Home 11. Anticipated post D/C treatments: HH therapy and Outpatient therapy 12. Overall Rehab/Functional Prognosis: excellent    RECOMMENDATIONS: This patient's condition is appropriate for continued rehabilitative care in the following setting: CIR Patient has agreed to participate in recommended program. Yes Note that insurance prior authorization may be required for reimbursement for recommended care.  Comment: Admit to inpatient rehab today  Meredith Staggers, MD, [facility] 01/28/2017   Cleatrice Burke 01/28/2017

## 2017-01-28 NOTE — Progress Notes (Signed)
Admit to unit via ambulance. Oriented to unit and plan of care. Reviewed schedule and medications and routine with pt and friend. No questions noted at this time. Pamelia HoitSharp, Tamica Covell B

## 2017-01-28 NOTE — H&P (Signed)
Physical Medicine and Rehabilitation Admission H&P    Chief Complaint  Patient presents with  . Weakness  : HPI: 83 year old right handed female with past medical history of hypertension, recurrent UTIs, hypothyroidism. left hip fracture with ORIF June 2016. Per chart review patient lives with daughter independent with a cane on occasion prior to admission driving, shopping and doing her own finances. One level home with 3 steps to entry. She also has very good assistance from a neighbor. Presented 01/25/2017 with left-sided weakness and numbness. Blood pressure 209/96. Troponin negative. Cranial CT scan negative. Patient did not receive TPA. MRI showed acute right thalamic infarction. MRA with no large vessel occlusion. Carotid Doppler no ICA stenosis. Echocardiogram with ejection fraction 65% no wall motion abnormalities. Maintained on aspirin for CVA prophylaxis. Subcutaneous Lovenox for DVT prophylaxis. Elevated TSH 31.569 with free T4 and T3 pending.. Patient currently remains on Synthroid and plans to follow-up thyroid panel in 2 weeks. Tolerating a regular diet consistency. Physical and occupational therapy evaluations completed with recommendations of physical medicine rehabilitation consult.   Review of Systems  Constitutional: Negative for chills and fever.  HENT: Negative for hearing loss and tinnitus.   Eyes: Negative for blurred vision and double vision.  Respiratory: Negative for cough and shortness of breath.   Cardiovascular: Negative for chest pain and leg swelling.  Gastrointestinal: Positive for constipation. Negative for nausea.  Genitourinary: Negative for dysuria, flank pain and hematuria.  Musculoskeletal: Positive for joint pain and myalgias.  Skin: Negative for rash.  Neurological: Positive for focal weakness.  All other systems reviewed and are negative.  Past Medical History:  Diagnosis Date  . Hypertension   . Thyroid disease    Past Surgical History:    Procedure Laterality Date  . ABDOMINAL HYSTERECTOMY    . ORIF HIP FRACTURE Left 09/02/2014   Procedure: OPEN REDUCTION INTERNAL FIXATION LEFT HIP FRACTURE;  Surgeon: Darreld Mclean, MD;  Location: [facility];  Service: Orthopedics;  Laterality: Left;   Family History  Problem Relation Age of Onset  . CAD Mother 25       Died of MI  . Heart failure Father   . CAD Father    Social History:  reports that she has never smoked. She has never used smokeless tobacco. She reports that she does not drink alcohol or use drugs. Allergies: No Known Allergies Medications Prior to Admission  Medication Sig Dispense Refill  . doxazosin (CARDURA) 4 MG tablet Take 4 mg by mouth daily.    Marland Kitchen HYDROcodone-acetaminophen (NORCO) 7.5-325 MG tablet Take 1 tablet by mouth every 4 (four) hours as needed for moderate pain.    Marland Kitchen levothyroxine (SYNTHROID, LEVOTHROID) 125 MCG tablet Take 125 mcg by mouth daily.    . naproxen (NAPROSYN) 500 MG tablet Take 500 mg by mouth 2 (two) times daily with a meal.    . naproxen sodium (ANAPROX) 220 MG tablet Take 220-440 mg by mouth daily as needed (for artritis pain).    . triamterene-hydrochlorothiazide (MAXZIDE-25) 37.5-25 MG per tablet Take 0.5 tablets by mouth daily.    . cephALEXin (KEFLEX) 500 MG capsule Take 1 capsule (500 mg total) by mouth 2 (two) times daily. 20 capsule 0  . oxyCODONE (OXY IR/ROXICODONE) 5 MG immediate release tablet Take 1 tablet (5 mg total) by mouth every 4 (four) hours as needed for severe pain. 20 tablet 0    Drug Regimen Review Drug regimen was reviewed and remains appropriate with no significant issues identified  Home:  Home Living Family/patient expects to be discharged to:: Private residence Living Arrangements: Children Available Help at Discharge: Family Type of Home: House Home Access: Stairs to enter Secretary/administratorntrance Stairs-Number of Steps: 3 Home Layout: One level Bathroom Shower/Tub: Engineer, manufacturing systemsTub/shower unit Bathroom Toilet: Standard Home  Equipment: Environmental consultantWalker - 2 wheels, Medical laboratory scientific officerCane - single point  Lives With: Daughter (Pt independent PTA)   Functional History:    Functional Status:  Mobility: Bed Mobility Overal bed mobility: Needs Assistance Bed Mobility: Supine to Sit, Sit to Supine Supine to sit: Min assist Sit to supine: Min assist General bed mobility comments: had difficulty propping up on  LLE due to weakness Transfers Overall transfer level: Needs assistance Equipment used: Rolling walker (2 wheeled) Transfers: Sit to/from Stand, Anadarko Petroleum CorporationStand Pivot Transfers Sit to Stand: Min assist, Mod assist Stand pivot transfers: Mod assist, Min assist General transfer comment: has most diffiuclty when side stepping with LLE due to incoordination  Ambulation/Gait Ambulation/Gait assistance: Mod assist, Min assist Ambulation Distance (Feet): 25 Feet Assistive device: Rolling walker (2 wheeled) Gait Pattern/deviations: Decreased step length - left, Decreased stance time - left, Decreased stride length General Gait Details: Patient demonstrates improvement for advancing LLE when taking steps in hallway followed with wheelchair, mild incoordination noted with slightly decreased left heelstrike Gait velocity interpretation: Below normal speed for age/gender    ADL:    Cognition: Cognition Overall Cognitive Status: Within Functional Limits for tasks assessed Arousal/Alertness: Awake/alert Orientation Level: Oriented X4 Memory: Impaired Memory Impairment: Retrieval deficit Awareness: Appears intact Problem Solving: Appears intact Safety/Judgment: Appears intact Comments: Pt stated that she would call for help to reach cane for ambulation Cognition Arousal/Alertness: Awake/alert Behavior During Therapy: WFL for tasks assessed/performed Overall Cognitive Status: Within Functional Limits for tasks assessed  Physical Exam: Blood pressure (!) 153/62, pulse 71, temperature 98.5 F (36.9 C), temperature source Oral, resp. rate 20,  height 5\' 5"  (1.651 m), weight 79.4 kg (175 lb), SpO2 94 %. Physical Exam  Vitals reviewed. Constitutional: She is oriented to person, place, and time.  HENT:  Head: Normocephalic.  Eyes: EOM are normal.  Neck: Normal range of motion. Neck supple. No thyromegaly present.  Cardiovascular: Normal rate, regular rhythm and normal heart sounds.   Respiratory: Effort normal and breath sounds normal. No respiratory distress.  GI: Soft. Bowel sounds are normal. She exhibits no distension.  Neurological: She is alert and oriented to person, place, and time.  Makes good eye contact with examiner. Follows full commands. Fair awareness of deficits. LUE 4/5 with pronator drift. LLE 4/5 prox to distal. No sensory loss. Face essentially symmetrical. No CN findings. Normal insight and awareness.   Skin: Skin is warm and dry.  Left lower lateral leg with 1" skin tear with foam dressing  Psychiatric: She has a normal mood and affect. Her behavior is normal. Thought content normal.    No results found for this or any previous visit (from the past 48 hour(s)). Mr Brain Wo Contrast  Result Date: 01/26/2017 CLINICAL DATA:  Left-sided weakness. EXAM: MRI HEAD WITHOUT CONTRAST MRA HEAD WITHOUT CONTRAST TECHNIQUE: Multiplanar, multiecho pulse sequences of the brain and surrounding structures were obtained without intravenous contrast. Angiographic images of the head were obtained using MRA technique without contrast. COMPARISON:  Head CT 01/25/2017 FINDINGS: MRI HEAD FINDINGS Brain: There is a 1.7 cm acute infarct in the lateral right thalamus without associated hemorrhage. Two foci of cortical/ subcortical chronic microhemorrhage are noted in the parietal lobes. Patchy T2 hyperintensities throughout the subcortical and deep cerebral white  matter are nonspecific but compatible with moderate chronic small vessel ischemic disease. The chronic cerebral white matter lacunar infarcts are noted bilaterally. Generalized  cerebral atrophy is mild for age. Vascular: Major intracranial vascular flow voids are preserved. Skull and upper cervical spine: No suspicious marrow lesion. Upper cervical disc and facet degeneration. Sinuses/Orbits: Right cataract extraction. Mild right maxillary sinus mucosal thickening. Clear mastoid air cells. Other: None. MRA HEAD FINDINGS The visualized distal vertebral arteries are patent to the basilar. Bilateral proximal V4 segment signal loss is considered artifactual. The left PICA is patent with a potential severe focal stenosis 7 mm distal to its origin. There is motion artifact through the proximal right AICA. The basilar artery is patent without stenosis. Posterior communicating arteries are not identified and may be diminutive or absent. P1 segments are widely patent bilaterally. The P2 and more distal PCA segments are poorly visualized and inadequately evaluated. The internal carotid arteries are patent from skullbase to carotid termini with mild right and mild-to-moderate left anterior genu stenosis versus artifact. ACAs and MCAs are patent without evidence of proximal branch occlusion or significant proximal M1 stenosis. There is evidence of a severe right ACA origin stenosis. There is asymmetric right M2 superior division branch vessel irregular narrowing versus artifact. No aneurysm is identified. IMPRESSION: 1. Acute right thalamic infarct. 2. Moderate chronic small vessel ischemic disease. 3. No large vessel occlusion. 4. Severe right A1 stenosis. 5. Possible mild-to-moderate ICA narrowing. Electronically Signed   By: Sebastian Ache M.D.   On: 01/26/2017 11:15   Mr Maxine Glenn Head/brain ZO Cm  Result Date: 01/26/2017 CLINICAL DATA:  Left-sided weakness. EXAM: MRI HEAD WITHOUT CONTRAST MRA HEAD WITHOUT CONTRAST TECHNIQUE: Multiplanar, multiecho pulse sequences of the brain and surrounding structures were obtained without intravenous contrast. Angiographic images of the head were obtained using  MRA technique without contrast. COMPARISON:  Head CT 01/25/2017 FINDINGS: MRI HEAD FINDINGS Brain: There is a 1.7 cm acute infarct in the lateral right thalamus without associated hemorrhage. Two foci of cortical/ subcortical chronic microhemorrhage are noted in the parietal lobes. Patchy T2 hyperintensities throughout the subcortical and deep cerebral white matter are nonspecific but compatible with moderate chronic small vessel ischemic disease. The chronic cerebral white matter lacunar infarcts are noted bilaterally. Generalized cerebral atrophy is mild for age. Vascular: Major intracranial vascular flow voids are preserved. Skull and upper cervical spine: No suspicious marrow lesion. Upper cervical disc and facet degeneration. Sinuses/Orbits: Right cataract extraction. Mild right maxillary sinus mucosal thickening. Clear mastoid air cells. Other: None. MRA HEAD FINDINGS The visualized distal vertebral arteries are patent to the basilar. Bilateral proximal V4 segment signal loss is considered artifactual. The left PICA is patent with a potential severe focal stenosis 7 mm distal to its origin. There is motion artifact through the proximal right AICA. The basilar artery is patent without stenosis. Posterior communicating arteries are not identified and may be diminutive or absent. P1 segments are widely patent bilaterally. The P2 and more distal PCA segments are poorly visualized and inadequately evaluated. The internal carotid arteries are patent from skullbase to carotid termini with mild right and mild-to-moderate left anterior genu stenosis versus artifact. ACAs and MCAs are patent without evidence of proximal branch occlusion or significant proximal M1 stenosis. There is evidence of a severe right ACA origin stenosis. There is asymmetric right M2 superior division branch vessel irregular narrowing versus artifact. No aneurysm is identified. IMPRESSION: 1. Acute right thalamic infarct. 2. Moderate chronic  small vessel ischemic disease. 3. No large  vessel occlusion. 4. Severe right A1 stenosis. 5. Possible mild-to-moderate ICA narrowing. Electronically Signed   By: Sebastian Ache M.D.   On: 01/26/2017 11:15       Medical Problem List and Plan: 1.  Left hemiparesis secondary to right thalamic infarction  -admit to inpatient rehab 2.  DVT Prophylaxis/Anticoagulation: Subcutaneous Lovenox. Monitor for any bleeding episodes 3. Pain Management: Tylenol as needed 4. Mood: Provide emotional support 5. Neuropsych: This patient is capable of making decisions on her own behalf. 6. Skin/Wound Care: Routine skin checks  -local skin tear to abrasions 7. Fluids/Electrolytes/Nutrition: Routine I&O's with follow-up chemistries 8. Hypertension. Maxide 37.5-25 mg daily. Monitor with increased mobility 9. Hyperlipidemia. Lipitor. 10. Hypothyroidism. Synthroid. TSH 31.569. Plans to follow-up thyroid panels in 2 weeks   Post Admission Physician Evaluation: 1. Functional deficits secondary  to right thalamic infarction. 2. Patient is admitted to receive collaborative, interdisciplinary care between the physiatrist, rehab nursing staff, and therapy team. 3. Patient's level of medical complexity and substantial therapy needs in context of that medical necessity cannot be provided at a lesser intensity of care such as a SNF. 4. Patient has experienced substantial functional loss from his/her baseline which was documented above under the "Functional History" and "Functional Status" headings.  Judging by the patient's diagnosis, physical exam, and functional history, the patient has potential for functional progress which will result in measurable gains while on inpatient rehab.  These gains will be of substantial and practical use upon discharge  in facilitating mobility and self-care at the household level. 5. Physiatrist will provide 24 hour management of medical needs as well as oversight of the therapy  plan/treatment and provide guidance as appropriate regarding the interaction of the two. 6. The Preadmission Screening has been reviewed and patient status is unchanged unless otherwise stated above. 7. 24 hour rehab nursing will assist with bladder management, bowel management, safety, skin/wound care, disease management, medication administration, pain management and patient education  and help integrate therapy concepts, techniques,education, etc. 8. PT will assess and treat for/with: Lower extremity strength, range of motion, stamina, balance, functional mobility, safety, adaptive techniques and equipment, NMR, family ed.   Goals are: mod I to supervision. 9. OT will assess and treat for/with: ADL's, functional mobility, safety, upper extremity strength, adaptive techniques and equipment, NMR, family ed.   Goals are: mod I to supervision. Therapy may proceed with showering this patient. 10. SLP will assess and treat for/with: cognition and communication.  Goals are: mod I. 11. Case Management and Social Worker will assess and treat for psychological issues and discharge planning. 12. Team conference will be held weekly to assess progress toward goals and to determine barriers to discharge. 13. Patient will receive at least 3 hours of therapy per day at least 5 days per week. 14. ELOS: 10-14 days       15. Prognosis:  excellent     Ranelle Oyster, MD, [facility] Physical Medicine & Rehabilitation 01/28/2017  Charlton Amor., PA-C 01/28/2017

## 2017-01-28 NOTE — H&P (Signed)
 [] Hover for attribution information    Physical Medicine and Rehabilitation Admission H&P       Chief Complaint  Patient presents with  . Weakness  : HPI: 83 year old right handed female with past medical history of hypertension, recurrent UTIs, hypothyroidism. left hip fracture with ORIF June 2016. Per chart review patient lives with daughter independent with a cane on occasion prior to admission driving, shopping and doing her own finances. One level home with 3 steps to entry. She also has very good assistance from a neighbor. Presented 01/25/2017 with left-sided weakness and numbness. Blood pressure 209/96. Troponin negative. Cranial CT scan negative. Patient did not receive TPA. MRI showed acute right thalamic infarction. MRA with no large vessel occlusion. Carotid Doppler no ICA stenosis. Echocardiogram with ejection fraction 65% no wall motion abnormalities. Maintained on aspirin for CVA prophylaxis. Subcutaneous Lovenox for DVT prophylaxis. Elevated TSH 31.569 with free T4 and T3 pending.. Patient currently remains on Synthroid and plans to follow-up thyroid panel in 2 weeks. Tolerating a regular diet consistency. Physical and occupational therapy evaluations completed with recommendations of physical medicine rehabilitation consult.   Review of Systems  Constitutional: Negative for chills and fever.  HENT: Negative for hearing loss and tinnitus.   Eyes: Negative for blurred vision and double vision.  Respiratory: Negative for cough and shortness of breath.   Cardiovascular: Negative for chest pain and leg swelling.  Gastrointestinal: Positive for constipation. Negative for nausea.  Genitourinary: Negative for dysuria, flank pain and hematuria.  Musculoskeletal: Positive for joint pain and myalgias.  Skin: Negative for rash.  Neurological: Positive for focal weakness.  All other systems reviewed and are negative.      Past Medical History:  Diagnosis Date  . Hypertension     . Thyroid disease         Past Surgical History:  Procedure Laterality Date  . ABDOMINAL HYSTERECTOMY    . ORIF HIP FRACTURE Left 09/02/2014   Procedure: OPEN REDUCTION INTERNAL FIXATION LEFT HIP FRACTURE;  Surgeon: Darreld McleanWayne Keeling, MD;  Location: [facility];  Service: Orthopedics;  Laterality: Left;        Family History  Problem Relation Age of Onset  . CAD Mother [id number]       Died of MI  . Heart failure Father   . CAD Father    Social History:  reports that she has never smoked. She has never used smokeless tobacco. She reports that she does not drink alcohol or use drugs. Allergies: No Known Allergies Medications Prior to Admission  Medication Sig Dispense Refill  . doxazosin (CARDURA) 4 MG tablet Take 4 mg by mouth daily.    Marland Kitchen. HYDROcodone-acetaminophen (NORCO) 7.5-325 MG tablet Take 1 tablet by mouth every 4 (four) hours as needed for moderate pain.    Marland Kitchen. levothyroxine (SYNTHROID, LEVOTHROID) 125 MCG tablet Take 125 mcg by mouth daily.    . naproxen (NAPROSYN) 500 MG tablet Take 500 mg by mouth 2 (two) times daily with a meal.    . naproxen sodium (ANAPROX) 220 MG tablet Take 220-440 mg by mouth daily as needed (for artritis pain).    . triamterene-hydrochlorothiazide (MAXZIDE-25) 37.5-25 MG per tablet Take 0.5 tablets by mouth daily.    . cephALEXin (KEFLEX) 500 MG capsule Take 1 capsule (500 mg total) by mouth 2 (two) times daily. 20 capsule 0  . oxyCODONE (OXY IR/ROXICODONE) 5 MG immediate release tablet Take 1 tablet (5 mg total) by mouth every 4 (four) hours as needed for severe pain. 20  tablet 0    Drug Regimen Review Drug regimen was reviewed and remains appropriate with no significant issues identified  Home: Home Living Family/patient expects to be discharged to:: Private residence Living Arrangements: Children Available Help at Discharge: Family Type of Home: House Home Access: Stairs to enter Secretary/administrator of Steps: 3 Home Layout: One  level Bathroom Shower/Tub: Engineer, manufacturing systems: Standard Home Equipment: Environmental consultant - 2 wheels, Medical laboratory scientific officer - single point  Lives With: Daughter (Pt independent PTA)   Functional History:  Functional Status:  Mobility: Bed Mobility Overal bed mobility: Needs Assistance Bed Mobility: Supine to Sit, Sit to Supine Supine to sit: Min assist Sit to supine: Min assist General bed mobility comments: had difficulty propping up on  LLE due to weakness Transfers Overall transfer level: Needs assistance Equipment used: Rolling walker (2 wheeled) Transfers: Sit to/from Stand, [employer] Transfers Sit to Stand: Min assist, Mod assist Stand pivot transfers: Mod assist, Min assist General transfer comment: has most diffiuclty when side stepping with LLE due to incoordination  Ambulation/Gait Ambulation/Gait assistance: Mod assist, Min assist Ambulation Distance (Feet): 25 Feet Assistive device: Rolling walker (2 wheeled) Gait Pattern/deviations: Decreased step length - left, Decreased stance time - left, Decreased stride length General Gait Details: Patient demonstrates improvement for advancing LLE when taking steps in hallway followed with wheelchair, mild incoordination noted with slightly decreased left heelstrike Gait velocity interpretation: Below normal speed for age/gender  ADL:  Cognition: Cognition Overall Cognitive Status: Within Functional Limits for tasks assessed Arousal/Alertness: Awake/alert Orientation Level: Oriented X4 Memory: Impaired Memory Impairment: Retrieval deficit Awareness: Appears intact Problem Solving: Appears intact Safety/Judgment: Appears intact Comments: Pt stated that she would call for help to reach cane for ambulation Cognition Arousal/Alertness: Awake/alert Behavior During Therapy: WFL for tasks assessed/performed Overall Cognitive Status: Within Functional Limits for tasks assessed  Physical Exam: Blood pressure (!) 153/62, pulse 71,  temperature 98.5 F (36.9 C), temperature source Oral, resp. rate 20, height 5\' 5"  (1.651 m), weight 79.4 kg (175 lb), SpO2 94 %. Physical Exam  Vitals reviewed. Constitutional: She is oriented to person, place, and time.  HENT:  Head: Normocephalic.  Eyes: EOM are normal.  Neck: Normal range of motion. Neck supple. No thyromegaly present.  Cardiovascular: Normal rate, regular rhythm and normal heart sounds.   Respiratory: Effort normal and breath sounds normal. No respiratory distress.  GI: Soft. Bowel sounds are normal. She exhibits no distension.  Neurological: She is alert and oriented to person, place, and time.  Makes good eye contact with examiner. Follows full commands. Fair awareness of deficits. LUE 4/5 with pronator drift. LLE 4/5 prox to distal. No sensory loss. Face essentially symmetrical. No CN findings. Normal insight and awareness.   Skin: Skin is warm and dry.  Left lower lateral leg with 1" skin tear with foam dressing  Psychiatric: She has a normal mood and affect. Her behavior is normal. Thought content normal.    Lab Results Last 48 Hours  No results found for this or any previous visit (from the past 48 hour(s)).    Imaging Results (Last 48 hours)  Mr Brain Wo Contrast  Result Date: 01/26/2017 CLINICAL DATA:  Left-sided weakness. EXAM: MRI HEAD WITHOUT CONTRAST MRA HEAD WITHOUT CONTRAST TECHNIQUE: Multiplanar, multiecho pulse sequences of the brain and surrounding structures were obtained without intravenous contrast. Angiographic images of the head were obtained using MRA technique without contrast. COMPARISON:  Head CT 01/25/2017 FINDINGS: MRI HEAD FINDINGS Brain: There is a 1.7 cm acute infarct  in the lateral right thalamus without associated hemorrhage. Two foci of cortical/ subcortical chronic microhemorrhage are noted in the parietal lobes. Patchy T2 hyperintensities throughout the subcortical and deep cerebral white matter are nonspecific but compatible  with moderate chronic small vessel ischemic disease. The chronic cerebral white matter lacunar infarcts are noted bilaterally. Generalized cerebral atrophy is mild for age. Vascular: Major intracranial vascular flow voids are preserved. Skull and upper cervical spine: No suspicious marrow lesion. Upper cervical disc and facet degeneration. Sinuses/Orbits: Right cataract extraction. Mild right maxillary sinus mucosal thickening. Clear mastoid air cells. Other: None. MRA HEAD FINDINGS The visualized distal vertebral arteries are patent to the basilar. Bilateral proximal V4 segment signal loss is considered artifactual. The left PICA is patent with a potential severe focal stenosis 7 mm distal to its origin. There is motion artifact through the proximal right AICA. The basilar artery is patent without stenosis. Posterior communicating arteries are not identified and may be diminutive or absent. P1 segments are widely patent bilaterally. The P2 and more distal PCA segments are poorly visualized and inadequately evaluated. The internal carotid arteries are patent from skullbase to carotid termini with mild right and mild-to-moderate left anterior genu stenosis versus artifact. ACAs and MCAs are patent without evidence of proximal branch occlusion or significant proximal M1 stenosis. There is evidence of a severe right ACA origin stenosis. There is asymmetric right M2 superior division branch vessel irregular narrowing versus artifact. No aneurysm is identified. IMPRESSION: 1. Acute right thalamic infarct. 2. Moderate chronic small vessel ischemic disease. 3. No large vessel occlusion. 4. Severe right A1 stenosis. 5. Possible mild-to-moderate ICA narrowing. Electronically Signed   By: Sebastian Ache M.D.   On: 01/26/2017 11:15   Mr Maxine Glenn Head/brain WG Cm  Result Date: 01/26/2017 CLINICAL DATA:  Left-sided weakness. EXAM: MRI HEAD WITHOUT CONTRAST MRA HEAD WITHOUT CONTRAST TECHNIQUE: Multiplanar, multiecho pulse  sequences of the brain and surrounding structures were obtained without intravenous contrast. Angiographic images of the head were obtained using MRA technique without contrast. COMPARISON:  Head CT 01/25/2017 FINDINGS: MRI HEAD FINDINGS Brain: There is a 1.7 cm acute infarct in the lateral right thalamus without associated hemorrhage. Two foci of cortical/ subcortical chronic microhemorrhage are noted in the parietal lobes. Patchy T2 hyperintensities throughout the subcortical and deep cerebral white matter are nonspecific but compatible with moderate chronic small vessel ischemic disease. The chronic cerebral white matter lacunar infarcts are noted bilaterally. Generalized cerebral atrophy is mild for age. Vascular: Major intracranial vascular flow voids are preserved. Skull and upper cervical spine: No suspicious marrow lesion. Upper cervical disc and facet degeneration. Sinuses/Orbits: Right cataract extraction. Mild right maxillary sinus mucosal thickening. Clear mastoid air cells. Other: None. MRA HEAD FINDINGS The visualized distal vertebral arteries are patent to the basilar. Bilateral proximal V4 segment signal loss is considered artifactual. The left PICA is patent with a potential severe focal stenosis 7 mm distal to its origin. There is motion artifact through the proximal right AICA. The basilar artery is patent without stenosis. Posterior communicating arteries are not identified and may be diminutive or absent. P1 segments are widely patent bilaterally. The P2 and more distal PCA segments are poorly visualized and inadequately evaluated. The internal carotid arteries are patent from skullbase to carotid termini with mild right and mild-to-moderate left anterior genu stenosis versus artifact. ACAs and MCAs are patent without evidence of proximal branch occlusion or significant proximal M1 stenosis. There is evidence of a severe right ACA origin stenosis. There is asymmetric  right M2 superior division  branch vessel irregular narrowing versus artifact. No aneurysm is identified. IMPRESSION: 1. Acute right thalamic infarct. 2. Moderate chronic small vessel ischemic disease. 3. No large vessel occlusion. 4. Severe right A1 stenosis. 5. Possible mild-to-moderate ICA narrowing. Electronically Signed   By: Sebastian Ache M.D.   On: 01/26/2017 11:15        Medical Problem List and Plan: 1.  Left hemiparesis secondary to right thalamic infarction             -admit to inpatient rehab 2.  DVT Prophylaxis/Anticoagulation: Subcutaneous Lovenox. Monitor for any bleeding episodes 3. Pain Management: Tylenol as needed 4. Mood: Provide emotional support 5. Neuropsych: This patient is capable of making decisions on her own behalf. 6. Skin/Wound Care: Routine skin checks             -local skin tear to abrasions 7. Fluids/Electrolytes/Nutrition: Routine I&O's with follow-up chemistries 8. Hypertension. Maxide 37.5-25 mg daily. Monitor with increased mobility 9. Hyperlipidemia. Lipitor. 10. Hypothyroidism. Synthroid. TSH 31.569. Plans to follow-up thyroid panels in 2 weeks   Post Admission Physician Evaluation: 1. Functional deficits secondary  to right thalamic infarction. 2. Patient is admitted to receive collaborative, interdisciplinary care between the physiatrist, rehab nursing staff, and therapy team. 3. Patient's level of medical complexity and substantial therapy needs in context of that medical necessity cannot be provided at a lesser intensity of care such as a SNF. 4. Patient has experienced substantial functional loss from his/her baseline which was documented above under the "Functional History" and "Functional Status" headings.  Judging by the patient's diagnosis, physical exam, and functional history, the patient has potential for functional progress which will result in measurable gains while on inpatient rehab.  These gains will be of substantial and practical use upon discharge  in  facilitating mobility and self-care at the household level. 5. Physiatrist will provide 24 hour management of medical needs as well as oversight of the therapy plan/treatment and provide guidance as appropriate regarding the interaction of the two. 6. The Preadmission Screening has been reviewed and patient status is unchanged unless otherwise stated above. 7. 24 hour rehab nursing will assist with bladder management, bowel management, safety, skin/wound care, disease management, medication administration, pain management and patient education  and help integrate therapy concepts, techniques,education, etc. 8. PT will assess and treat for/with: Lower extremity strength, range of motion, stamina, balance, functional mobility, safety, adaptive techniques and equipment, NMR, family ed.   Goals are: mod I to supervision. 9. OT will assess and treat for/with: ADL's, functional mobility, safety, upper extremity strength, adaptive techniques and equipment, NMR, family ed.   Goals are: mod I to supervision. Therapy may proceed with showering this patient. 10. SLP will assess and treat for/with: cognition and communication.  Goals are: mod I. 11. Case Management and Social Worker will assess and treat for psychological issues and discharge planning. 12. Team conference will be held weekly to assess progress toward goals and to determine barriers to discharge. 13. Patient will receive at least 3 hours of therapy per day at least 5 days per week. 14. ELOS: 10-14 days       15. Prognosis:  excellent     Ranelle Oyster, MD, [facility] Physical Medicine & Rehabilitation 01/28/2017  Charlton Amor., PA-C 01/28/2017

## 2017-01-28 NOTE — Progress Notes (Signed)
Pt discharged today to CIR per Dr. Ella JubileeArrien. Pt's IV site D/C'd and WDL. Pt's VSS. [facility] Verbalized understanding. Pt currently awaiting arrival of [facility] to transport.

## 2017-01-28 NOTE — Evaluation (Signed)
Occupational Therapy Evaluation Patient Details Name: Andrea [name] MRN: [id number] DOB: 10/02/1925 Today's Date: 01/28/2017    History of Present Illness  Andrea Hatchetancy Sanson is a 83 y.o. female with medical history significant of hypertension, hypothyroidism, osteoarthritis, UTIs who is being brought via EMS to the hospital after she developed weakness and numbness of her left upper extremity, particularly her hand. She mentions that she was walking across her bedroom to get into bed and was holding her cell phone. Then she dropped it unintentionally and when she tried up picked up with her left hand noticed a lot of difficulty. She also developed left lower extremity weakness. She mentions that she has had 2 similar episodes like this when she has developed UTIs and usually they resolve with antibiotics. MRI positive for acute CVA   Clinical Impression   Pt received supine in bed, agreeable to OT evaluation. Pt reporting she feels better than yesterday, left side is weak. During evaluation pt limited by left weakness and coordination deficits. During bed<>chair transfer pt noted to lose her grip on the RW with left hand, requiring verbal cuing and tactile assistance for replacing hand on walker as she was unaware that she was no longer holding the walker. Pt attempting to use LUE as assist for ADLs, increased time for coordination. Pt is unable to complete tasks in standing due to weakness and balance deficits. Recommend CIR on discharge for improving safety and independence in ADL completion and functional mobility tasks.     Follow Up Recommendations  CIR    Equipment Recommendations  None recommended by OT       Precautions / Restrictions Precautions Precautions: Fall Restrictions Weight Bearing Restrictions: No      Mobility Bed Mobility Overal bed mobility: Needs Assistance Bed Mobility: Supine to Sit     Supine to sit: Min assist        Transfers Overall transfer level: Needs  assistance Equipment used: Rolling walker (2 wheeled) Transfers: Sit to/from [employer];Stand Pivot Transfers Sit to Stand: Mod assist Stand pivot transfers: Min assist       General transfer comment: coordination deficits limiting independence and sequencing        ADL either performed or assessed with clinical judgement   ADL Overall ADL's : Needs assistance/impaired Eating/Feeding: Modified independent;Sitting   Grooming: Wash/dry hands;Wash/dry face;Set up;Sitting Grooming Details (indicate cue type and reason): Pt requiring set-up for seated grooming tasks, increased time required when using left hand for completion due to difficulty maintaining grasp on items and coordination deficits Upper Body Bathing: Minimal assistance;Sitting   Lower Body Bathing: Minimal assistance;Moderate assistance;Sitting/lateral leans   Upper Body Dressing : Minimal assistance;Sitting Upper Body Dressing Details (indicate cue type and reason): Assistance for managing gown buttons/ties due to coordination deficits with LUE Lower Body Dressing: Min guard;Sitting/lateral leans Lower Body Dressing Details (indicate cue type and reason): Increased time for adjusting socks using primarily right hand, difficulty using left hand due to strength and coordination deficits Toilet Transfer: Minimal assistance;Stand-pivot;BSC   Toileting- Clothing Manipulation and Hygiene: Min guard;Sitting/lateral lean               Vision Baseline Vision/History: No visual deficits Patient Visual Report: No change from baseline Vision Assessment?: Yes Eye Alignment: Within Functional Limits Ocular Range of Motion: Within Functional Limits Alignment/Gaze Preference: Within Defined Limits Tracking/Visual Pursuits: Able to track stimulus in all quads without difficulty Saccades: Within functional limits Convergence: Within functional limits Visual Fields: No apparent deficits  Pertinent Vitals/Pain Pain  Assessment: No/denies pain     Hand Dominance Right   Extremity/Trunk Assessment Upper Extremity Assessment Upper Extremity Assessment: LUE deficits/detail LUE Deficits / Details: LUE strength 3+/5 throughout, decreased grip strength LUE Sensation: decreased light touch LUE Coordination: decreased fine motor;decreased gross motor   Lower Extremity Assessment Lower Extremity Assessment: Defer to PT evaluation   Cervical / Trunk Assessment Cervical / Trunk Assessment: Normal   Communication Communication Communication: No difficulties   Cognition Arousal/Alertness: Awake/alert Behavior During Therapy: WFL for tasks assessed/performed Overall Cognitive Status: Within Functional Limits for tasks assessed                                                Home Living Family/patient expects to be discharged to:: Private residence Living Arrangements: Children (daughter) Available Help at Discharge: Family Type of Home: House Home Access: Stairs to enter Secretary/administrator of Steps: 3   Home Layout: One level     Bathroom Shower/Tub: Chief Strategy Officer: Standard     Home Equipment: Environmental consultant - 2 wheels;Gilmer Mor - single point      Lives With: Daughter    Prior Functioning/Environment Level of Independence: Independent with assistive device(s)        Comments: Used cane for functional mobility. Pt independent in B/IADL completion including mowing yard         OT Problem List: Decreased strength;Decreased activity tolerance;Impaired balance (sitting and/or standing);Decreased coordination;Decreased knowledge of use of DME or AE;Impaired UE functional use;Impaired sensation      OT Treatment/Interventions: Self-care/ADL training;Therapeutic exercise;Neuromuscular education;DME and/or AE instruction;Energy conservation;Therapeutic activities;Visual/perceptual remediation/compensation;Patient/family education    OT Goals(Current goals can  be found in the care plan section) Acute Rehab OT Goals Patient Stated Goal: To be independent OT Goal Formulation: With patient Time For Goal Achievement: 02/11/17 Potential to Achieve Goals: Good  OT Frequency: Min 2X/week   Barriers to D/C:                  End of Session Equipment Utilized During Treatment: Gait belt;Rolling walker  Activity Tolerance: Patient tolerated treatment well Patient left: in chair;with call bell/phone within reach  OT Visit Diagnosis: Muscle weakness (generalized) (M62.81);Hemiplegia and hemiparesis Hemiplegia - Right/Left: Left Hemiplegia - dominant/non-dominant: Non-Dominant Hemiplegia - caused by: Cerebral infarction                Time: 4098-1191 OT Time Calculation (min): 29 min Charges:  OT General Charges $OT Visit: 1 Visit OT Evaluation $OT Eval Low Complexity: 1 Low   Ezra Sites, OTR/L  [phone] 01/28/2017, 8:16 AM

## 2017-01-28 NOTE — Discharge Summary (Signed)
Physician Discharge Summary  Andrea [name] ZOX:[id number] DOB: [birth year] DOA: 01/25/2017  PCP: [facility]  Admit date: 01/25/2017 Discharge date: 01/28/2017  Admitted From: Home Disposition:  Inpatient rehab  Recommendations for Outpatient Follow-up:  1. Follow up with PCP in 1- week 2. Patient has been placed on aspirin and statin therapy 3. Continue blood pressure control 4. Check thyroid function testing in 2 weeks.   Home Health: na  Equipment/Devices:na   Discharge Condition: Stable CODE STATUS: Full  Diet recommendation: Heart healthy  Brief/Interim Summary: 83 year old female who presented with left hand paresthesias. Patient has significant past medical history for hypertension, hypothyroidism, osteoarthritis, who developed acute weakness and numbness on her left upper extremity, more prominent on her left hand. Apparently she developed acute weakness on her left hand, unable to to hold objects due to loss of grip strength, associated left lower extremity weakness. EMS was called, and patient was brought into the hospital. On the initial physical examination blood pressure 209/96, 181/93, heart rate 73, respiratory rate 13, oxygen saturation 99%. Mucous membranes were moist, lungs were clear to auscultation bilaterally, no wheezing or rhonchi, heart S1-S2 present rhythmic, no gallops, +2/6 systolic murmur, abdomen soft nontender, no lower extremity edema, positive hemiparesis and left side 4 out of 5 with significant decrease grip on the left. Sodium 146, potassium 4.0, chloride 108, bicarbonate 28, glucose 119, BUN 20, creatinine 0.8, troponin less than 0.03, white count 8.9, hemoglobin 15.2, hematocrit 44.6, platelets 368. Urinalysis negative for infection, head CT with no acute changes, chest x-ray with right rotation, no infiltrates, effusions or pneumothorax. TSH 31.5, EKG normal sinus rhythm with premature atrial complexes, normal axis, normal  intervals.  The patient was admitted to hospital with working diagnosis of focal neurologic deficit, rule out transient ischemic attack, complicated by hypothyroidism.   1. Acute ischemic right thalamic infarct. Patient was admitted to the hospital, she was placed on remote telemetry monitor, frequent neuro checks, antiplatelet therapy with aspirin, and further blood pressure control. Workup included carotid artery ultrasonography which showed mild atherosclerotic disease bilaterally. Brain MRI/MRA, with acute right thalamic infarct, moderate chronic small vessel ischemic changes, severe right A1 stenosis, possible mild-to-moderate ICA narrowing. Echocardiogram showed normal LV systolic function, and she remain on sinus rhythm on telemetry monitor. Her symptoms have been improving, she was evaluated by physical therapy, with recommendations for inpatient rehabilitation. Patient has been started on high potency statin. Will need close follow-up as outpatient.  2. Hypothyroidism. TSH was noted to be elevated, but clinically patient euthyroid, will continue current dose of levothyroxine, recommendations to check thyroid function testing in 2 weeks.  3. Hypertension. Patient was resumed on hydrochlorothiazide/triamterene, with good blood pressure control, systolic 120 to 140.   4. Severe osteoarthritis. Continue physical therapy and pain control.  Discharge Diagnoses:  Principal Problem:   TIA (transient ischemic attack) Active Problems:   Hypothyroidism   Hypertension   Hypernatremia   CVA (cerebral vascular accident) [facility])    Discharge Instructions   Allergies as of 01/28/2017   No Known Allergies     Medication List    STOP taking these medications   cephALEXin 500 MG capsule Commonly known as:  KEFLEX   naproxen sodium 220 MG tablet Commonly known as:  ALEVE   oxyCODONE 5 MG immediate release tablet Commonly known as:  Oxy IR/ROXICODONE     TAKE these medications    acetaminophen 325 MG tablet Commonly known as:  TYLENOL Take 2 tablets (650 mg total) by mouth every  4 (four) hours as needed for mild pain (or temp > 37.5 C (99.5 F)).   aspirin 81 MG EC tablet Take 1 tablet (81 mg total) by mouth daily.   atorvastatin 40 MG tablet Commonly known as:  LIPITOR Take 1 tablet (40 mg total) by mouth daily at 6 PM.   doxazosin 4 MG tablet Commonly known as:  CARDURA Take 4 mg by mouth daily.   HYDROcodone-acetaminophen 7.5-325 MG tablet Commonly known as:  NORCO Take 1 tablet by mouth every 4 (four) hours as needed for moderate pain.   levothyroxine 125 MCG tablet Commonly known as:  SYNTHROID, LEVOTHROID Take 125 mcg by mouth daily.   naproxen 500 MG tablet Commonly known as:  NAPROSYN Take 500 mg by mouth 2 (two) times daily with a meal.   triamterene-hydrochlorothiazide 37.5-25 MG tablet Commonly known as:  MAXZIDE-25 Take 0.5 tablets by mouth daily.       No Known Allergies  Consultations:     Procedures/Studies: Dg Chest 2 View  Result Date: 01/25/2017 CLINICAL DATA:  TIA, unable to keep left arm up for lateral EXAM: CHEST  2 VIEW COMPARISON:  09/01/2014 FINDINGS: Heart is normal in size. There is prominence of interstitial markings throughout the lungs not associated with focal consolidation or pleural effusions. IMPRESSION: Mildly prominent interstitial markings may be chronic. No focal acute pulmonary abnormality. Electronically Signed   By: Norva Pavlov M.D.   On: 01/25/2017 11:37   Ct Head Wo Contrast  Result Date: 01/25/2017 CLINICAL DATA:  Acute onset of left arm heaviness. Urinary incontinence. Initial encounter. EXAM: CT HEAD WITHOUT CONTRAST TECHNIQUE: Contiguous axial images were obtained from the base of the skull through the vertex without intravenous contrast. COMPARISON:  CT of the head performed 09/11/2016 FINDINGS: Brain: No evidence of acute infarction, hemorrhage, hydrocephalus, extra-axial collection or  mass lesion/mass effect. Prominence of the ventricles and sulci reflects mild to moderate cortical volume loss. Mild cerebellar atrophy is noted. Scattered periventricular and subcortical white matter change likely reflects small vessel ischemic microangiopathy. The brainstem and fourth ventricle are within normal limits. The basal ganglia are unremarkable in appearance. The cerebral hemispheres demonstrate grossly normal gray-white differentiation. No mass effect or midline shift is seen. Vascular: No hyperdense vessel or unexpected calcification. Skull: There is no evidence of fracture; visualized osseous structures are unremarkable in appearance. Sinuses/Orbits: The orbits are within normal limits. The paranasal sinuses and mastoid air cells are well-aerated. Other: No significant soft tissue abnormalities are seen. IMPRESSION: 1. No acute intracranial pathology seen on CT. 2. Mild to moderate cortical volume loss and scattered small vessel ischemic microangiopathy. Electronically Signed   By: Roanna Raider M.D.   On: 01/25/2017 05:03   Mr Brain Wo Contrast  Result Date: 01/26/2017 CLINICAL DATA:  Left-sided weakness. EXAM: MRI HEAD WITHOUT CONTRAST MRA HEAD WITHOUT CONTRAST TECHNIQUE: Multiplanar, multiecho pulse sequences of the brain and surrounding structures were obtained without intravenous contrast. Angiographic images of the head were obtained using MRA technique without contrast. COMPARISON:  Head CT 01/25/2017 FINDINGS: MRI HEAD FINDINGS Brain: There is a 1.7 cm acute infarct in the lateral right thalamus without associated hemorrhage. Two foci of cortical/ subcortical chronic microhemorrhage are noted in the parietal lobes. Patchy T2 hyperintensities throughout the subcortical and deep cerebral white matter are nonspecific but compatible with moderate chronic small vessel ischemic disease. The chronic cerebral white matter lacunar infarcts are noted bilaterally. Generalized cerebral atrophy is  mild for age. Vascular: Major intracranial vascular flow voids are preserved.  Skull and upper cervical spine: No suspicious marrow lesion. Upper cervical disc and facet degeneration. Sinuses/Orbits: Right cataract extraction. Mild right maxillary sinus mucosal thickening. Clear mastoid air cells. Other: None. MRA HEAD FINDINGS The visualized distal vertebral arteries are patent to the basilar. Bilateral proximal V4 segment signal loss is considered artifactual. The left PICA is patent with a potential severe focal stenosis 7 mm distal to its origin. There is motion artifact through the proximal right AICA. The basilar artery is patent without stenosis. Posterior communicating arteries are not identified and may be diminutive or absent. P1 segments are widely patent bilaterally. The P2 and more distal PCA segments are poorly visualized and inadequately evaluated. The internal carotid arteries are patent from skullbase to carotid termini with mild right and mild-to-moderate left anterior genu stenosis versus artifact. ACAs and MCAs are patent without evidence of proximal branch occlusion or significant proximal M1 stenosis. There is evidence of a severe right ACA origin stenosis. There is asymmetric right M2 superior division branch vessel irregular narrowing versus artifact. No aneurysm is identified. IMPRESSION: 1. Acute right thalamic infarct. 2. Moderate chronic small vessel ischemic disease. 3. No large vessel occlusion. 4. Severe right A1 stenosis. 5. Possible mild-to-moderate ICA narrowing. Electronically Signed   By: Sebastian AcheAllen  Grady M.D.   On: 01/26/2017 11:15   [place] Carotid Bilateral (at [facility] And [facility] Only)  Result Date: 01/25/2017 CLINICAL DATA:  TIA and weakness. EXAM: BILATERAL CAROTID DUPLEX ULTRASOUND TECHNIQUE: Wallace CullensGray scale imaging, color Doppler and duplex ultrasound were performed of bilateral carotid and vertebral arteries in the neck. COMPARISON:  None. FINDINGS: Criteria: Quantification of carotid  stenosis is based on velocity parameters that correlate the residual internal carotid diameter with NASCET-based stenosis levels, using the diameter of the distal internal carotid lumen as the denominator for stenosis measurement. The following velocity measurements were obtained: RIGHT ICA:  76 cm/sec CCA:  82 cm/sec SYSTOLIC ICA/CCA RATIO:  0.9 DIASTOLIC ICA/CCA RATIO:  1.2 ECA:  84 cm/sec LEFT ICA:  207 cm/sec CCA:  115 cm/sec SYSTOLIC ICA/CCA RATIO:  1.8 DIASTOLIC ICA/CCA RATIO:  2.5 ECA:  98 cm/sec RIGHT CAROTID ARTERY: Mild echogenic plaque at the right carotid bulb. External carotid artery is patent with normal waveform. Small amount of plaque in the proximal internal carotid artery. Normal waveforms and velocities in the internal carotid artery. RIGHT VERTEBRAL ARTERY: Antegrade flow and normal waveform in the right vertebral artery. LEFT CAROTID ARTERY: Left common carotid artery is tortuous. Small amount of plaque at the left carotid bulb. External carotid artery is patent with normal waveform. Elevated peak systolic velocity in the proximal left internal carotid artery measuring 207 cm/sec. There is only minimal plaque at this area with an elevated peak systolic velocity. Mid and distal left internal carotid artery patent. LEFT VERTEBRAL ARTERY: Antegrade flow and normal waveform in the left vertebral artery. IMPRESSION: Mild atherosclerotic disease in the bilateral carotid arteries. Elevated peak systolic velocity in the proximal left internal carotid artery. Peak systolic velocity in the left internal carotid artery estimates stenosis between 50-69%. Peak systolic velocity may be falsely elevated due to tortuosity because the stenosis appears to be less than 50% based on the grayscale or color Doppler imaging. Study has technical limitations due to tortuosity and patient's inability to stay completely still. Consider short-term follow-up or further characterization with CTA or MRA. Estimated degree  stenosis in the right internal carotid artery is less than 50%. Patent vertebral arteries with antegrade flow. Electronically Signed   By: Richarda OverlieAdam  Henn  M.D.   On: 01/25/2017 13:42   Mr Maxine Glenn Head/brain VH Cm  Result Date: 01/26/2017 CLINICAL DATA:  Left-sided weakness. EXAM: MRI HEAD WITHOUT CONTRAST MRA HEAD WITHOUT CONTRAST TECHNIQUE: Multiplanar, multiecho pulse sequences of the brain and surrounding structures were obtained without intravenous contrast. Angiographic images of the head were obtained using MRA technique without contrast. COMPARISON:  Head CT 01/25/2017 FINDINGS: MRI HEAD FINDINGS Brain: There is a 1.7 cm acute infarct in the lateral right thalamus without associated hemorrhage. Two foci of cortical/ subcortical chronic microhemorrhage are noted in the parietal lobes. Patchy T2 hyperintensities throughout the subcortical and deep cerebral white matter are nonspecific but compatible with moderate chronic small vessel ischemic disease. The chronic cerebral white matter lacunar infarcts are noted bilaterally. Generalized cerebral atrophy is mild for age. Vascular: Major intracranial vascular flow voids are preserved. Skull and upper cervical spine: No suspicious marrow lesion. Upper cervical disc and facet degeneration. Sinuses/Orbits: Right cataract extraction. Mild right maxillary sinus mucosal thickening. Clear mastoid air cells. Other: None. MRA HEAD FINDINGS The visualized distal vertebral arteries are patent to the basilar. Bilateral proximal V4 segment signal loss is considered artifactual. The left PICA is patent with a potential severe focal stenosis 7 mm distal to its origin. There is motion artifact through the proximal right AICA. The basilar artery is patent without stenosis. Posterior communicating arteries are not identified and may be diminutive or absent. P1 segments are widely patent bilaterally. The P2 and more distal PCA segments are poorly visualized and inadequately evaluated.  The internal carotid arteries are patent from skullbase to carotid termini with mild right and mild-to-moderate left anterior genu stenosis versus artifact. ACAs and MCAs are patent without evidence of proximal branch occlusion or significant proximal M1 stenosis. There is evidence of a severe right ACA origin stenosis. There is asymmetric right M2 superior division branch vessel irregular narrowing versus artifact. No aneurysm is identified. IMPRESSION: 1. Acute right thalamic infarct. 2. Moderate chronic small vessel ischemic disease. 3. No large vessel occlusion. 4. Severe right A1 stenosis. 5. Possible mild-to-moderate ICA narrowing. Electronically Signed   By: Sebastian Ache M.D.   On: 01/26/2017 11:15       Subjective: Patient feeling better, no nausea or vomiting, weakness have been improving, no chest pain or dyspnea.   Discharge Exam: Vitals:   01/28/17 0229 01/28/17 0629  BP: (!) 153/62 (!) 125/58  Pulse: 71 68  Resp: 20 20  Temp: 98.5 F (36.9 C) 98.6 F (37 C)  SpO2: 94% 95%   Vitals:   01/27/17 1829 01/27/17 2229 01/28/17 0229 01/28/17 0629  BP: (!) 141/63 114/74 (!) 153/62 (!) 125/58  Pulse: 77 67 71 68  Resp: 20 20 20 20   Temp: 98.3 F (36.8 C) 98.2 F (36.8 C) 98.5 F (36.9 C) 98.6 F (37 C)  TempSrc: Oral Oral Oral Oral  SpO2: 96% 96% 94% 95%  Weight:      Height:        General: Pt is alert, awake, not in acute distress E ENT, no pallor, moist mucous membranes Cardiovascular: RRR, S1/S2 +, no rubs, no gallops Respiratory: CTA bilaterally, no wheezing, no rhonchi Abdominal: Soft, NT, ND, bowel sounds + Extremities: no edema, no cyanosis Neurology. Weakness on left hand 4/5 and left leg 4/5    The results of significant diagnostics from this hospitalization (including imaging, microbiology, ancillary and laboratory) are listed below for reference.     Microbiology: No results found for this or any previous visit (from  the past 240 hour(s)).    Labs: BNP (last 3 results) No results for input(s): BNP in the last 8760 hours. Basic Metabolic Panel:  Recent Labs Lab 01/25/17 0436 01/26/17 0546  NA 146* 139  K 4.0 3.8  CL 108 107  CO2 28 25  GLUCOSE 119* 118*  BUN 20 18  CREATININE 0.98 0.99  CALCIUM 10.1 9.1   Liver Function Tests: No results for input(s): AST, ALT, ALKPHOS, BILITOT, PROT, ALBUMIN in the last 168 hours. No results for input(s): LIPASE, AMYLASE in the last 168 hours. No results for input(s): AMMONIA in the last 168 hours. CBC:  Recent Labs Lab 01/25/17 0436  WBC 8.9  NEUTROABS 6.3  HGB 15.2*  HCT 44.6  MCV 94.5  PLT 368   Cardiac Enzymes:  Recent Labs Lab 01/25/17 0436  TROPONINI <0.03   BNP: Invalid input(s): POCBNP CBG: No results for input(s): GLUCAP in the last 168 hours. D-Dimer No results for input(s): DDIMER in the last 72 hours. Hgb A1c  Recent Labs  01/26/17 0545  HGBA1C 5.8*   Lipid Profile  Recent Labs  01/26/17 0546  CHOL 180  HDL 48  LDLCALC 114*  TRIG 89  CHOLHDL 3.8   Thyroid function studies No results for input(s): TSH, T4TOTAL, T3FREE, THYROIDAB in the last 72 hours.  Invalid input(s): FREET3 Anemia work up No results for input(s): VITAMINB12, FOLATE, FERRITIN, TIBC, IRON, RETICCTPCT in the last 72 hours. Urinalysis    Component Value Date/Time   COLORURINE STRAW (A) 01/25/2017 0436   APPEARANCEUR CLEAR 01/25/2017 0436   LABSPEC 1.006 01/25/2017 0436   PHURINE 7.0 01/25/2017 0436   GLUCOSEU NEGATIVE 01/25/2017 0436   HGBUR NEGATIVE 01/25/2017 0436   BILIRUBINUR NEGATIVE 01/25/2017 0436   KETONESUR NEGATIVE 01/25/2017 0436   PROTEINUR NEGATIVE 01/25/2017 0436   UROBILINOGEN 0.2 01/29/2015 0536   NITRITE NEGATIVE 01/25/2017 0436   LEUKOCYTESUR NEGATIVE 01/25/2017 0436   Sepsis Labs Invalid input(s): PROCALCITONIN,  WBC,  LACTICIDVEN Microbiology No results found for this or any previous visit (from the past 240 hour(s)).   Time  coordinating discharge: 45 minutes  SIGNED:   Coralie Keens, MD  [facility] 01/28/2017, 9:18 AM Pager [phone]  If 7PM-7AM, please contact night-coverage [email or web address] Password [email or web address]

## 2017-01-28 NOTE — Progress Notes (Signed)
[facility]. I spoke with Shanda BumpsJessica, RN CM , to make the arrangements. I contacted via phone pt, Her neighbor, Orvilla Fusommy and daughter, Zella BallRobin of plans to admit today. All are in agreement. I will make the arrangements. Plans to admit room 4M01 under Dr. Wynn BankerKirsteins today. [phone]

## 2017-01-28 NOTE — Progress Notes (Signed)
Meredith Staggers, MD Physician Signed Physical Medicine and Rehabilitation  PMR Pre-admission Date of Service: 01/28/2017 10:52 AM  Related encounter: ED to Hosp-Admission (Discharged) from 01/25/2017 in [place]       _0 Hide copied text   [employer] PMR Admission Coordinator Pre-Admission Assessment  Patient: Andrea [name] is an 83 y.o., female MRN: [id number] DOB: 1925-07-26 Height: _1  (165.1 cm) Weight: 79.4 kg (175 lb)  Insurance Information HMO:     PPO:      PCP:      IPA:      80/20: yes     OTHER: no HMO PRIMARY: Medicare a and b      Policy#: [id number]      Subscriber: pt Benefits:  Phone #: passport one online     Name: 01/27/17 Eff. Date: 09/29/1992     Deduct: $1340      Out of Pocket Max: none      Life Max: none CIR: 100%      SNF: 20 full days Outpatient: 80%     Co-Pay: 20% Home Health: 100%      Co-Pay: none DME: 80%     Co-Pay: 20% Providers: pt choice  SECONDARY: Medicaid [place] Access      Policy#: [id number]      Subscriber: pt  Medicaid Application Date:       Case Manager:  Disability Application Date:       Case Worker:   Emergency Contact Information        Contact Information    Name Relation Home Work Mobile   [place] Friend [phone]     [place],Robin Daughter [phone]        Current Medical History  Patient Admitting Diagnosis: right Thalamic Infarction  History of Present Illness: HPI: 83 year old right handed female with past medical history of hypertension, recurrent UTIs,hypothyroidism.left hip fracture with ORIF June 2016. Presented 01/25/2017 with left-sided weakness and numbness. Blood pressure 209/96. Troponin negative. Cranial CT scan negative. Patient did not receive TPA. MRI showed acute right thalamic infarction. MRA with no large vessel occlusion. Carotid Doppler no ICA stenosis. Echocardiogram with ejection fraction 65% no wall motion abnormalities. Maintained on  aspirin for CVA prophylaxis. Subcutaneous Lovenox for DVT prophylaxis. Elevated TSH 31.569 with free T4 and T3 pending. Patient currently remains on Synthroid and plans to follow-up thyroid panel in 2weeks. Tolerating a regular diet consistency.   Patient's medical record from [facility] has been reviewed by the rehabilitation admission coordinator and physician. NIH Stroke scale:3 Glascow Coma Scale:n/a  Past Medical History      Past Medical History:  Diagnosis Date  . Hypertension   . Thyroid disease     Family History   family history includes CAD in her father; CAD (age of onset: 35) in her mother; Heart failure in her father.  Prior Rehab/Hospitalizations Has the patient had major surgery during 100 days prior to admission? No             Has been to [facility] in [place] after hip replacement 2 years ago for 30 days   Current Medications Tylenol Aspirin atorvastin Doxazosin norco Maxzide   Patients Current Diet:  Regular diet with thin liquids  Precautions / Restrictions Precautions Precautions: Fall Restrictions Weight Bearing Restrictions: No   Has the patient had 2 or more falls or a fall with injury in the past year?No  Prior Activity Level Community (5-7x/wk): Mod I with cane; drives, Indpendent with adls  Pt does own mowing, weed eating and tills in garden. 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 Likes to play the scratch offs lottery. Drives. Has a dog, Muffin.  Prior Functional Level Self Care: Did the patient need help bathing, dressing, using the toilet or eating?  Independent  Indoor Mobility: Did the patient need assistance with walking from room to room (with or without device)? Independent  Stairs: Did the patient need assistance with internal or external stairs (with or without device)? Independent  Functional Cognition: Did the patient need help planning regular tasks such as shopping or remembering to take medications?  Independent  Home Assistive Devices / Equipment Home Assistive Devices/Equipment: Cane (specify quad or straight) Home Equipment: Walker - 2 wheels, Cane - single point  Prior Device Use: Indicate devices/aids used by the patient prior to current illness, exacerbation or injury? cane   Prior Functional Level Current Functional Level  Bed Mobility  Independent  Min assist   Transfers  Independent  Mod assist; during transfers, pt noted to lose her grip on the RW with left hand. Needs increased time for coordination. Decreased balance and weakness noted. Difficulty propping up on LLE due to weakness..   Mobility - Walk/Wheelchair  Mod Independent (used cane)  Mod assist. Min to mod assist 25 feet with RW.Decreased left heel stride.   Upper Body Dressing  Independent  Min assist; decreased coordination noted. Decreased strength left hand. Patient is right hand dominant.    Lower Body Dressing  Independent  Mod assist   Grooming  Independent  Min assist   Eating/Drinking  Independent  Min assist   Toilet Transfer  Independent  Mod assist   Bladder Continence   continent  incontinent; external catheter   Bowel Management  continent  LBM 01/25/17. 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 Very independent      Special needs/care consideration BiPAP/CPAP  N/a CPM  N/a Continuous Drip IV  N/a Dialysis  N/a Life Vest  N/a Oxygen  N/a Special Bed  N/a Trach Size n/a Wound Vac n/a Skin skin tear left lower leg with sponge dressing applied Bowel mgmt: LBM 10/27 prior to admit Bladder mgmt: incontinent; external catheter Diabetic mgmt   N/a  Previous Home Environment Living Arrangements:  (26 year old daughter, [name])  Lives With: Daughter Available Help at Discharge: Family, Available 24 hours/day Type of Home: Mobile home Home Layout: One level Home Access: Ramped entrance Entrance Stairs-Number of Steps: 3 Bathroom Shower/Tub: Chiropodist: Standard Bathroom Accessibility: Yes How Accessible: Accessible via walker [place]: No  Discharge Living Setting Plans for Discharge Living Setting: Patient's home, Mobile Home Type of Home at Discharge: Mobile home Discharge Home Layout: One level Discharge Home Access: Pearson entrance Discharge Bathroom Shower/Tub: Bandera unit Discharge Bathroom Toilet: Standard Discharge Bathroom Accessibility: Yes How Accessible: Accessible via walker Does the patient have any problems obtaining your medications?: No  Social/Family/Support Systems Patient Roles: Parent Contact Information: Tommy, neighbor, is main contact Anticipated Caregiver: Konrad Dolores and Robin Anticipated Caregiver's Contact Information: see above Ability/Limitations of Caregiver: Robin unreliable at times therefore Toomy is main contatc Caregiver Availability: 24/7 Discharge Plan Discussed with Primary  Caregiver: Yes Is Caregiver In Agreement with Plan?: Yes Does Caregiver/Family have Issues with Lodging/Transportation while Pt is in Rehab?: No   Neighbor, Tommy/Herman, is primary contact. Pt's daughter, [name], lives with pt but has "health Issues". Neighbor states she takes too many pills and is unreliable therefore he lives 2 doors down and helps with whatever. Ms. Rhys needs.  Goals/Additional Needs Patient/Family Goal for Rehab: Mod I to supervision with PT, OT and SLP Expected length of stay: ELOS 10- 14 days Pt/Family Agrees to Admission and willing to participate: Yes Program Orientation Provided & Reviewed with Pt/Caregiver Including Roles  & Responsibilities:  Yes  Patient Condition: I have reviewed medical records from [facility] as well as met with patient and her neighbor at bedside. Patient will benefit form ongoing PT, OT< and SLP. She will receive a coordinated team approach during an Inpatient Acute Rehabilitation admission. She will receive 3 hours per day of therapy 5 days per week , Rehabilitation physician as well as Nursing care. She is currently min to mod functionally with mobility and adls. We will admit to Acute Inpatient Rehabilitation today.  Preadmission Screen Completed By:  Cleatrice Burke, 01/28/2017 11:15 AM ______________________________________________________________________   Discussed status with Dr. Naaman Plummer  on  01/28/2017  at  1117 and received telephone approval for admission today.  Admission Coordinator:  Cleatrice Burke, time  4268 Date  01/28/2017   Assessment/Plan: Diagnosis: right thalamic infarction 1. Does the need for close, 24 hr/day  Medical supervision in concert with the patient's rehab needs make it unreasonable for this patient to be served in a less intensive setting? Yes 2. Co-Morbidities requiring supervision/potential complications: htn, thyroid disease, post-stroke sequelae 3. Due to bladder management, bowel management, safety, skin/wound care, disease management, medication administration, pain management and patient education, does the patient require 24 hr/day rehab nursing? Yes 4. Does the patient require coordinated care of a physician, rehab nurse, PT (1-2 hrs/day, 5 days/week), OT (1-2 hrs/day, 5 days/week) and SLP (1-2 hrs/day, 5 days/week) to address physical and functional deficits in the context of the above medical diagnosis(es)? Yes Addressing deficits in the following areas: balance, endurance, locomotion, strength, transferring, bowel/bladder control, bathing, dressing, feeding, grooming, toileting, cognition, speech and psychosocial support 5. Can the patient  actively participate in an intensive therapy program of at least 3 hrs of therapy 5 days a week? Yes 6. The potential for patient to make measurable gains while on inpatient rehab is excellent 7. Anticipated functional outcomes upon discharge from inpatients are: modified independent and supervision PT, modified independent and supervision OT, modified independent and supervision SLP 8. Estimated rehab length of stay to reach the above functional goals is: 10-14 days 9. Does the patient have adequate social supports to accommodate these discharge functional goals? Yes 10. Anticipated D/C setting: Home 11. Anticipated post D/C treatments: HH therapy and Outpatient therapy 12. Overall Rehab/Functional Prognosis: excellent    RECOMMENDATIONS: This patient's condition is appropriate for continued rehabilitative care in the following setting: CIR Patient has agreed to participate in recommended program. Yes Note that insurance prior authorization may be required for reimbursement for recommended care.  Comment: Admit to inpatient rehab today  Meredith Staggers, MD, [place] Physical Medicine & Rehabilitation 01/28/2017   Cleatrice Burke 01/28/2017    Revision History

## 2017-01-28 NOTE — Care Management Note (Signed)
Case Management Note  Patient Details  Name: Andrea [name] MRN: [id number] Date of Birth: 12/13/1925  Discharging to CIR today. Pt going to Rm 4MW1, accepting is Dr. Doroteo BradfordKirstein. Unit secretary to arrange transportation. CIR rep given bedside RN phone number to call report to.   Expected Discharge Date:  01/28/17               Expected Discharge Plan:  IP Rehab Facility  In-House Referral:  NA  Discharge planning Services  CM Consult  Post Acute Care Choice:  NA Choice offered to:  NA  Status of Service:  Completed, signed off  Malcolm MetroChildress, Lena Gores Demske, RN 01/28/2017, 10:28 AM

## 2017-01-29 ENCOUNTER — Inpatient Hospital Stay (HOSPITAL_COMMUNITY): Payer: Medicare Other | Admitting: Occupational Therapy

## 2017-01-29 ENCOUNTER — Inpatient Hospital Stay (HOSPITAL_COMMUNITY): Payer: Medicare Other | Admitting: Speech Pathology

## 2017-01-29 ENCOUNTER — Inpatient Hospital Stay (HOSPITAL_COMMUNITY): Payer: Medicare Other | Admitting: Physical Therapy

## 2017-01-29 LAB — COMPREHENSIVE METABOLIC PANEL
ALBUMIN: 3.9 g/dL (ref 3.5–5.0)
ALT: 25 U/L (ref 14–54)
ANION GAP: 12 (ref 5–15)
AST: 46 U/L — ABNORMAL HIGH (ref 15–41)
Alkaline Phosphatase: 92 U/L (ref 38–126)
BUN: 30 mg/dL — ABNORMAL HIGH (ref 6–20)
CO2: 23 mmol/L (ref 22–32)
Calcium: 9.8 mg/dL (ref 8.9–10.3)
Chloride: 102 mmol/L (ref 101–111)
Creatinine, Ser: 1.28 mg/dL — ABNORMAL HIGH (ref 0.44–1.00)
GFR calc non Af Amer: 36 mL/min — ABNORMAL LOW (ref >60)
GFR, EST AFRICAN AMERICAN: 42 mL/min — AB (ref >60)
Glucose, Bld: 131 mg/dL — ABNORMAL HIGH (ref 65–99)
Potassium: 3.9 mmol/L (ref 3.5–5.1)
Sodium: 137 mmol/L (ref 135–145)
Total Bilirubin: 1 mg/dL (ref 0.3–1.2)
Total Protein: 7.2 g/dL (ref 6.5–8.1)

## 2017-01-29 LAB — CBC WITH DIFFERENTIAL/PLATELET
BASOS PCT: 0 %
Basophils Absolute: 0 10*3/uL (ref 0.0–0.1)
EOS ABS: 0 10*3/uL (ref 0.0–0.7)
EOS PCT: 0 %
HCT: 47.4 % — ABNORMAL HIGH (ref 36.0–46.0)
Hemoglobin: 16.3 g/dL — ABNORMAL HIGH (ref 12.0–15.0)
LYMPHS ABS: 2.3 10*3/uL (ref 0.7–4.0)
Lymphocytes Relative: 28 %
MCH: 32 pg (ref 26.0–34.0)
MCHC: 34.4 g/dL (ref 30.0–36.0)
MCV: 92.9 fL (ref 78.0–100.0)
Monocytes Absolute: 0.8 10*3/uL (ref 0.1–1.0)
Monocytes Relative: 9 %
Neutro Abs: 5.3 10*3/uL (ref 1.7–7.7)
Neutrophils Relative %: 63 %
PLATELETS: 380 10*3/uL (ref 150–400)
RBC: 5.1 MIL/uL (ref 3.87–5.11)
RDW: 13.7 % (ref 11.5–15.5)
WBC: 8.4 10*3/uL (ref 4.0–10.5)

## 2017-01-29 LAB — T3, FREE: T3 FREE: 1.7 pg/mL — AB (ref 2.0–4.4)

## 2017-01-29 NOTE — Progress Notes (Signed)
Social Work Assessment and Plan Social Work Assessment and Plan  Patient Details  Name: Andrea [name] MRN: [id number] Date of Birth: 11/15/1925  Today's Date: 01/29/2017  Problem List:  Patient Active Problem List   Diagnosis Date Noted  . Right thalamic infarction (HCC) 01/28/2017  . Thalamic stroke (HCC)   . TIA (transient ischemic attack) 01/25/2017  . Hypernatremia 01/25/2017  . CVA (cerebral vascular accident) (HCC) 01/25/2017  . Intertrochanteric fracture of left hip (HCC) 09/01/2014  . Hypothyroidism 09/01/2014  . Hypertension 09/01/2014  . Closed left hip fracture (HCC) 09/01/2014   Past Medical History:  Past Medical History:  Diagnosis Date  . Hypertension   . Thyroid disease    Past Surgical History:  Past Surgical History:  Procedure Laterality Date  . ABDOMINAL HYSTERECTOMY    . ORIF HIP FRACTURE Left 09/02/2014   Procedure: OPEN REDUCTION INTERNAL FIXATION LEFT HIP FRACTURE;  Surgeon: Darreld McleanWayne Keeling, MD;  Location: [facility];  Service: Orthopedics;  Laterality: Left;   Social History:  reports that she has never smoked. She has never used smokeless tobacco. She reports that she does not drink alcohol or use drugs.  Family / Support Systems Marital Status: Divorced Patient Roles: Parent Children: Zella BallRobin daughter [phone]-cell Other Supports: Tommy/Herman-friend [phone]-cell Anticipated Caregiver: Orvilla Fusommy and Zella BallRobin Ability/Limitations of Caregiver: Zella BallRobin has health issues of her own and is taking care of the animals. Pt needs to be mod/i-supervision beofre going home Caregiver Availability: 24/7 Family Dynamics: Close with daughter whom lives with her. She is grateful to have Tommy and her friends watching out for her. She has good supports she feels with her age, most of her freinds have passed away.  Social History Preferred language: English Religion:  Cultural Background: No issues Education: High School Read: Yes Write: Yes Employment Status:  Retired Fish farm managerLegal Hisotry/Current Legal Issues: No issues Guardian/Conservator: None-according to MD pt is capable of making her own decisions while here.   Abuse/Neglect Physical Abuse: Denies Verbal Abuse: Denies Sexual Abuse: Denies Exploitation of patient/patient's resources: Denies Self-Neglect: Denies  Emotional Status Pt's affect, behavior adn adjustment status: Pt is motivated to improve and recover from this stroke. She has made improvement already which is encouraging to her. She will work hard and see who well she does here. She wants to go home from here and not to a nursing home. Recent Psychosocial Issues: other health issues were maanged doing well until this Pyschiatric History: No issues-deferred depression screen due to coping appropriately and verbalizes her concerns and feelings. Substance Abuse History: No issues  Patient / Family Perceptions, Expectations & Goals Pt/Family understanding of illness & functional limitations: Pt and Tommy are able to verbalize her stroke and deficits. She has made progress and is pleased and hopeful this will continue. They have spoken with the MD's and feel their questions and concerns have been addressed. Premorbid pt/family roles/activities: Mom, friend, retiree, tc Anticipated changes in roles/activities/participation: resume Pt/family expectations/goals: Pt states: " I want to be able to take care of myself by the time I leave here."  Orvilla Fusommy states: " If there is a will there is a way."  [employer] IncCommunity Resources Community Agencies: Other (Comment) ([facility]) Premorbid Home Care/DME Agencies: Other (Comment) (RW[facility] Transportation available at discharge: Friends Resource referrals recommended: Support group (specify)  Discharge Planning Living Arrangements: Children Support Systems: Children, Manufacturing engineerriends/neighbors, Psychologist, clinicalChurch/faith community Type of Residence: Private residence Insurance  Resources: [employer] EntertainmentMedicare, [employer] EnergyMedicaid (specify county) Architectinancial Resources: Restaurant manager, fast foodocial Security Financial  Screen Referred: No Living Expenses: Own Money Management: Patient Does the patient have any problems obtaining your medications?: No Home Management: Patient and daughter need to do more according to pt Patient/Family Preliminary Plans: Return home with daughter who can provide supervision due to her own health issues. Will need to at least be supervision level prior to discharge to be able to go home. Has been to a NH after THR and if needed can go back to one. Await team's evaluations and work on safe discharge plan. Sw Barriers to Discharge: Decreased caregiver support Sw Barriers to Discharge Comments: Supervision level only per daughter Social Work Anticipated Follow Up Needs: HH/OP, Support Group  Clinical Impression Pleasant young looking 83 yo woman.She has been very active and loves being outside with her animals. Very motivated to do well here and get back home but will need to reach supervision-mod/i to be able too. She has been to a NH before after THR. Work on a safe discharge plan for her. Supportive friend-Tommy.  Lucy Chris 01/29/2017, 12:44 PM

## 2017-01-29 NOTE — Evaluation (Signed)
Physical Therapy Assessment and Plan  Patient Details  Name: Andrea [name] MRN: [id number] Date of Birth: [birth year]  PT Diagnosis: Abnormality of gait, Ataxia, Ataxic gait, Coordination disorder, Difficulty walking, Hemiplegia non-dominant and Muscle weakness Rehab Potential: Good ELOS: 10-14days    Today's Date: 01/29/2017 PT Individual Time: 0900-1030 PT Individual Time Calculation (min): 90 min    Problem List:  Patient Active Problem List   Diagnosis Date Noted  . Right thalamic infarction ([place]) 01/28/2017  . Thalamic stroke ([place])   . TIA (transient ischemic attack) 01/25/2017  . Hypernatremia 01/25/2017  . CVA (cerebral vascular accident) ([place]) 01/25/2017  . Intertrochanteric fracture of left hip ([place]) 09/01/2014  . Hypothyroidism 09/01/2014  . Hypertension 09/01/2014  . Closed left hip fracture ([place]) 09/01/2014    Past Medical History:  Past Medical History:  Diagnosis Date  . Hypertension   . Thyroid disease    Past Surgical History:  Past Surgical History:  Procedure Laterality Date  . ABDOMINAL HYSTERECTOMY    . ORIF HIP FRACTURE Left 09/02/2014   Procedure: OPEN REDUCTION INTERNAL FIXATION LEFT HIP FRACTURE;  Surgeon: Sanjuana Kava, MD;  Location: [facility];  Service: Orthopedics;  Laterality: Left;    Assessment & Plan Clinical Impression: Patient is a 83 year old right handed female with past medical history of hypertension, recurrent UTIs,hypothyroidism.left hip fracture with ORIF June 2016. Per chart review patient lives with daughter independent with a caneon occasionprior to admission driving, shopping and doing her own finances. One level home with 3 steps to entry.She also has very good assistance from a neighbor.Presented 01/25/2017 with left-sided weakness and numbness. Blood pressure 209/96. Troponin negative. Cranial CT scan negative. Patient did not receive TPA. MRI showed acute right thalamic infarction. MRA with no large vessel occlusion. Carotid  Doppler no ICA stenosis. Echocardiogram with ejection fraction 65% no wall motion abnormalities. Maintained on aspirin for CVA prophylaxis. Subcutaneous Lovenox for DVT prophylaxis  Patient transferred to CIR on 01/28/2017 .   Patient currently requires mod with mobility secondary to muscle weakness, decreased cardiorespiratoy endurance, impaired timing and sequencing, unbalanced muscle activation, motor apraxia, ataxia, decreased coordination and decreased motor planning and decreased sitting balance, decreased standing balance, decreased postural control, hemiplegia and decreased balance strategies.  Prior to hospitalization, patient was modified independent  with mobility and lived with Daughter in a House home.  Home access is 4Ramped entrance, Stairs to enter (alternate entrance with ramp ).  Patient will benefit from skilled PT intervention to maximize safe functional mobility, minimize fall risk and decrease caregiver burden for planned discharge home with 24 hour supervision.  Anticipate patient will benefit from follow up [place] at discharge.  PT - End of Session Activity Tolerance: Tolerates 10 - 20 min activity with multiple rests Endurance Deficit: Yes PT Assessment Rehab Potential (ACUTE/IP ONLY): Good PT Barriers to Discharge: Inaccessible home environment;Decreased caregiver support;Home environment access/layout PT Patient demonstrates impairments in the following area(s): Balance;Edema;Motor;Endurance;Nutrition;Pain;Safety;Sensory;Skin Integrity PT Transfers Functional Problem(s): Bed Mobility;Bed to Chair;Car;Furniture;Floor PT Locomotion Functional Problem(s): Ambulation;Stairs;Wheelchair Mobility PT Plan PT Intensity: Minimum of 1-2 x/day ,45 to 90 minutes PT Frequency: 5 out of 7 days PT Duration Estimated Length of Stay: 10-14days  PT Treatment/Interventions: Training and development officer;Ambulation/gait training;Community reintegration;Discharge planning;Disease  management/prevention;Functional electrical stimulation;Cognitive remediation/compensation;DME/adaptive equipment instruction;Functional mobility training;Neuromuscular re-education;Pain management;Patient/family education;Splinting/orthotics;Stair training;Psychosocial support;Therapeutic Activities;Therapeutic Exercise;UE/LE Coordination activities;UE/LE Strength taining/ROM;Visual/perceptual remediation/compensation;Wheelchair propulsion/positioning PT Transfers Anticipated Outcome(s): Mod I with LRAD  PT Locomotion Anticipated Outcome(s): Ambulatory with supervision assist and LRAD  PT Recommendation Follow Up Recommendations: Home health PT Patient  destination: Home Equipment Recommended: To be determined Equipment Details: has RW, SPC, and WC   Skilled Therapeutic Intervention Pt received supine in bed and agreeable to PT. Supine>sit transfer with min assist and cues for safety PT instructed patient in PT Evaluation and initiated treatment intervention; see below for results. PT educated patient in [place], rehab potential, rehab goals, and discharge recommendations. Patient returned to room and left sitting in [facility] with call bell in reach and all needs met.        PT Evaluation Precautions/Restrictions Precautions Precautions: Fall Restrictions Weight Bearing Restrictions: No General   Vital Signs Pain Pain Assessment Pain Assessment: No/denies pain Pain Score: 0-No pain Home Living/Prior Functioning Home Living Available Help at Discharge: Family Type of Home: House Home Access: Ramped entrance;Stairs to enter (alternate entrance with ramp ) Entrance Stairs-Number of Steps: 4 Entrance Stairs-Rails: Right;Left;Can reach both Home Layout: One level Bathroom Shower/Tub: Chiropodist: Standard  Lives With: Daughter Prior Function Level of Independence: Requires assistive device for independence (uses SPC )  Able to Take Stairs?: Yes Driving: Yes Vocation:  Retired Comments: Used cane for functional mobility. Pt independent in B/IADL completion including mowing yard  Vision/Perception  Vision - Assessment Eye Alignment: Within Functional Limits Alignment/Gaze Preference: Within Defined Limits Saccades: Within functional limits Perception Perception: Within Functional Limits Praxis Praxis: Intact  Cognition   WFL for all tasks assessed  Sensation Sensation Light Touch: Appears Intact Proprioception: Impaired by gross assessment Additional Comments: poor Neuromoto contorl in the LUE and RLE Coordination Gross Motor Movements are Fluid and Coordinated: No Fine Motor Movements are Fluid and Coordinated: No Coordination and Movement Description: Ataxia on the L UE and LE Finger Nose Finger Test: ataxic/dysmetric on the L Heel Shin Test: Ataxia on the L  Motor  Motor Motor: Hemiplegia;Ataxia Motor - Skilled Clinical Observations: Mild L sided Hemiplegia.   Mobility Bed Mobility Bed Mobility: Rolling Right;Rolling Left;Supine to Sit;Sit to Supine Rolling Right: 5: Supervision Rolling Right Details: Verbal cues for precautions/safety;Verbal cues for technique;Verbal cues for safe use of DME/AE Rolling Left: 5: Supervision Rolling Left Details: Verbal cues for technique;Verbal cues for precautions/safety;Verbal cues for safe use of DME/AE Supine to Sit: 4: Min assist (with bed rails ) Sit to Supine: 4: Min guard Transfers Transfers: Yes Sit to Stand: 4: Min assist Sit to Stand Details: Visual cues/gestures for sequencing;Verbal cues for sequencing Stand to Sit: 4: Min assist Stand to Sit Details (indicate cue type and reason): Visual cues/gestures for sequencing;Verbal cues for sequencing Stand Pivot Transfers: 4: Min assist Stand Pivot Transfer Details: Verbal cues for precautions/safety;Verbal cues for technique Locomotion  Ambulation Ambulation: Yes Ambulation/Gait Assistance: 4: Min assist;3: Mod assist Ambulation Distance  (Feet): 80 Feet Assistive device: Rolling walker Ambulation/Gait Assistance Details: Verbal cues for technique;Verbal cues for precautions/safety;Verbal cues for gait pattern;Verbal cues for safe use of DME/AE Gait Gait: Yes Gait Pattern: Impaired Gait Pattern: Step-to pattern;Step-through pattern;Ataxic Stairs / Additional Locomotion Stairs: Yes Stairs Assistance: 3: Mod assist Stairs Assistance Details: Verbal cues for technique Stair Management Technique: Two rails Number of Stairs: 4 Height of Stairs: 3 Wheelchair Mobility Wheelchair Mobility: Yes Wheelchair Assistance: 3: Mod assist;2: Max Technical sales engineer Details: Verbal cues for technique;Verbal cues for safe use of DME/AE;Manual facilitation for weight bearing;Manual facilitation for placement Wheelchair Propulsion: Both upper extremities Wheelchair Parts Management: Needs assistance Distance: 44f   Trunk/Postural Assessment  Cervical Assessment Cervical Assessment: Exceptions to [facility] Assessment Thoracic Assessment: Exceptions to [facility]umbar Assessment Lumbar Assessment:  Exceptions to [facility] Postural Control Postural Control: Deficits on evaluation Righting Reactions: delayed in standing. posterior/L LOB   Balance Balance Balance Assessed: Yes Static Sitting Balance Static Sitting - Level of Assistance: 6: Modified independent (Device/Increase time) Dynamic Sitting Balance Dynamic Sitting - Level of Assistance: 4: Min assist Static Standing Balance Static Standing - Balance Support: Right upper extremity supported Static Standing - Level of Assistance: 4: Min assist Dynamic Standing Balance Dynamic Standing - Balance Support: Bilateral upper extremity supported Dynamic Standing - Level of Assistance: 3: Mod assist Extremity Assessment      RLE Assessment RLE Assessment: Within Functional Limits (grossly 4+/5 proximal to distal ) LLE Assessment LLE Assessment: Exceptions to [facility] (4/5 to 4+/5  proximal to distal )   See Function Navigator for Current Functional Status.   Refer to Care Plan for Long Term Goals  Recommendations for other services: Therapeutic Recreation  Stress management  Discharge Criteria: Patient will be discharged from PT if patient refuses treatment 3 consecutive times without medical reason, if treatment goals not met, if there is a change in medical status, if patient makes no progress towards goals or if patient is discharged from hospital.  The above assessment, treatment plan, treatment alternatives and goals were discussed and mutually agreed upon: by patient  Lorie Phenix 01/29/2017, 10:29 AM

## 2017-01-29 NOTE — Progress Notes (Signed)
Patient information reviewed and entered into eRehab system by Vernis Cabacungan, RN, CRRN, PPS Coordinator.  Information including medical coding and functional independence measure will be reviewed and updated through discharge.     Per nursing patient was given "Data Collection Information Summary for Patients in Inpatient Rehabilitation Facilities with attached "Privacy Act Statement-Health Care Records" upon admission.  

## 2017-01-29 NOTE — Evaluation (Signed)
Occupational Therapy Assessment and Plan  Patient Details  Name: Andrea [name] MRN: [id number] Date of Birth: 03-21-1926  OT Diagnosis: ataxia, hemiplegia affecting non-dominant side and muscle weakness (generalized) Rehab Potential: Rehab Potential (ACUTE ONLY): Good ELOS: 10-14 days   Today's Date: 01/29/2017 OT Individual Time: 7672-0947 OT Individual Time Calculation (min): 60 min     Problem List:  Patient Active Problem List   Diagnosis Date Noted  . Right thalamic infarction ([place]) 01/28/2017  . Thalamic stroke ([place])   . TIA (transient ischemic attack) 01/25/2017  . Hypernatremia 01/25/2017  . CVA (cerebral vascular accident) ([place]) 01/25/2017  . Intertrochanteric fracture of left hip ([place]) 09/01/2014  . Hypothyroidism 09/01/2014  . Hypertension 09/01/2014  . Closed left hip fracture ([place]) 09/01/2014    Past Medical History:  Past Medical History:  Diagnosis Date  . Hypertension   . Thyroid disease    Past Surgical History:  Past Surgical History:  Procedure Laterality Date  . ABDOMINAL HYSTERECTOMY    . ORIF HIP FRACTURE Left 09/02/2014   Procedure: OPEN REDUCTION INTERNAL FIXATION LEFT HIP FRACTURE;  Surgeon: Sanjuana Kava, MD;  Location: [facility];  Service: Orthopedics;  Laterality: Left;    Assessment & Plan Clinical Impression: HPI: 83 year old right handed female with past medical history of hypertension, recurrent UTIs,hypothyroidism.left hip fracture with ORIF June 2016. Per chart review patient lives with daughter independent with a caneon occasionprior to admission driving, shopping and doing her own finances. One level home with 3 steps to entry.She also has very good assistance from a neighbor.Presented 01/25/2017 with left-sided weakness and numbness. Blood pressure 209/96. Troponin negative. Cranial CT scan negative. Patient did not receive TPA. MRI showed acute right thalamic infarction. MRA with no large vessel occlusion. Carotid Doppler no ICA  stenosis. Echocardiogram with ejection fraction 65% no wall motion abnormalities. Maintained on aspirin for CVA prophylaxis. Subcutaneous Lovenox for DVT prophylaxis. Elevated TSH 31.569 with free T4 and T3 pending.. Patient currently remains on Synthroid and plans to follow-up thyroid panel in 2weeks. Tolerating a regular diet consistency. Physical and occupational therapy evaluations completed with recommendations of physical medicine rehabilitation consult.   Patient transferred to CIR on 01/28/2017 .    Patient currently requires max with basic self-care skills secondary to muscle weakness, decreased cardiorespiratoy endurance, abnormal tone, ataxia, decreased coordination and decreased motor planning and decreased standing balance, decreased postural control, hemiplegia and decreased balance strategies.  Prior to hospitalization, patient could complete ADLs/IADLs with independent .  Patient will benefit from skilled intervention to decrease level of assist with basic self-care skills and increase independence with basic self-care skills prior to discharge home with care partner.  Anticipate patient will require intermittent supervision and follow up home health and follow up outpatient.  OT - End of Session Activity Tolerance: Tolerates 10 - 20 min activity with multiple rests Endurance Deficit: Yes OT Assessment Rehab Potential (ACUTE ONLY): Good OT Patient demonstrates impairments in the following area(s): Balance;Endurance;Safety;Motor;Sensory OT Basic ADL's Functional Problem(s): Eating;Grooming;Bathing;Dressing;Toileting OT Advanced ADL's Functional Problem(s): Simple Meal Preparation;Laundry;Light Housekeeping OT Transfers Functional Problem(s): Tub/Shower;Toilet OT Additional Impairment(s): Fuctional Use of Upper Extremity OT Plan OT Intensity: Minimum of 1-2 x/day, 45 to 90 minutes OT Frequency: 5 out of 7 days OT Duration/Estimated Length of Stay: 10-14 days OT  Treatment/Interventions: Balance/vestibular training;Discharge planning;Community reintegration;DME/adaptive equipment instruction;Functional mobility training;Neuromuscular re-education;Pain management;Psychosocial support;Patient/family education;Self Care/advanced ADL retraining;Therapeutic Activities;Therapeutic Exercise;UE/LE Strength taining/ROM;UE/LE Coordination activities OT Self Feeding Anticipated Outcome(s): Mod I OT Basic Self-Care Anticipated Outcome(s): Supervision-mod I OT Toileting Anticipated  Outcome(s): Mod I OT Bathroom Transfers Anticipated Outcome(s): Mod I OT Recommendation Recommendations for Other Services: Therapeutic Recreation consult Therapeutic Recreation Interventions: Pet therapy;Kitchen group;Stress management;Outing/community reintergration Patient destination: Home Follow Up Recommendations: Home health OT Equipment Recommended: To be determined   Skilled Therapeutic Intervention Pt seen for OT eval and ADL bathing/dressing session. Pt sitting up in w/c upon arrival, agreeable to tx session. She required mod A sit > stand throughout session. She ambulated with min-mod A into bathroom with VCs for RW management. She bathed seated on tub bench with assist for buttock hygiene (standing and then  Lateral leans as standing unsuccessful). Pt attempting to use L UE in functional manner throughout session, however, with great difficulty to due severe proprioceptive deficits. Education provided regarding decreased proprioception and functional implications.  She returned to w/c to dress, assist required for advancing clothes over hips.  Pt left seated in w/c at end of session, all needs in reach. Education provided throughout session regarding role of OT, POC, pt's deficits and functional implications, and d/c planning.   OT Evaluation Precautions/Restrictions  Precautions Precautions: Fall Restrictions Weight Bearing Restrictions: No General Chart Reviewed:  Yes Pain Pain Assessment Pain Assessment: No/denies pain Pain Score: 0-No pain Home Living/Prior Functioning Home Living Living Arrangements: Children Available Help at Discharge: Family Type of Home: House Home Access: Ramped entrance, Stairs to enter (alternate entrance with ramp ) Entrance Stairs-Number of Steps: 4 Entrance Stairs-Rails: Right, Left, Can reach both Home Layout: One level Bathroom Shower/Tub: Chiropodist: Standard  Lives With: Daughter Prior Function Level of Independence: Requires assistive device for independence (uses SPC )  Able to Take Stairs?: Yes Driving: Yes Vocation: Retired Comments: Used cane for functional mobility. Pt independent in B/IADL completion including mowing yard  Vision Baseline Vision/History: No visual deficits Patient Visual Report: No change from baseline Vision Assessment?: No apparent visual deficits Eye Alignment: Within Functional Limits Alignment/Gaze Preference: Within Defined Limits Tracking/Visual Pursuits: Able to track stimulus in all quads without difficulty Saccades: Within functional limits Visual Fields: No apparent deficits Perception  Perception: Within Functional Limits Praxis Praxis: Intact Cognition Overall Cognitive Status: Within Functional Limits for tasks assessed Arousal/Alertness: Awake/alert Memory: Impaired Memory Impairment: Retrieval deficit Awareness: Appears intact Problem Solving: Appears intact Safety/Judgment: Appears intact Sensation Sensation Light Touch: Appears Intact Proprioception: Impaired by gross assessment Additional Comments: poor Neuromoto contorl in the LUE and RLE Coordination Gross Motor Movements are Fluid and Coordinated: No Fine Motor Movements are Fluid and Coordinated: No Coordination and Movement Description: Ataxia on the L UE and LE Finger Nose Finger Test: ataxic/dysmetric on the L Heel Shin Test: Ataxia on the L  Motor  Motor Motor:  Hemiplegia;Ataxia Motor - Skilled Clinical Observations: Mild L sided Hemiplegia.  Mobility  Bed Mobility Bed Mobility: Rolling Right;Rolling Left;Supine to Sit;Sit to Supine Rolling Right: 5: Supervision Rolling Right Details: Verbal cues for precautions/safety;Verbal cues for technique;Verbal cues for safe use of DME/AE Rolling Left: 5: Supervision Rolling Left Details: Verbal cues for technique;Verbal cues for precautions/safety;Verbal cues for safe use of DME/AE Supine to Sit: 4: Min assist (with bed rails ) Sit to Supine: 4: Min guard Transfers Sit to Stand: 4: Min assist Sit to Stand Details: Visual cues/gestures for sequencing;Verbal cues for sequencing Stand to Sit: 4: Min assist Stand to Sit Details (indicate cue type and reason): Visual cues/gestures for sequencing;Verbal cues for sequencing  Trunk/Postural Assessment  Cervical Assessment Cervical Assessment: Exceptions to [facility] (Forward head) Thoracic Assessment Thoracic Assessment: Exceptions to [facility] (  Kyphotic) Lumbar Assessment Lumbar Assessment: Exceptions to [facility] (Posterior pelvic tilt) Postural Control Postural Control: Deficits on evaluation Righting Reactions: delayed in standing. posterior/L LOB   Balance Balance Balance Assessed: Yes Static Sitting Balance Static Sitting - Level of Assistance: 5: Stand by assistance;6: Modified independent (Device/Increase time) Dynamic Sitting Balance Dynamic Sitting - Level of Assistance: 4: Min assist Static Standing Balance Static Standing - Balance Support: During functional activity Static Standing - Level of Assistance: 4: Min assist;3: Mod assist Static Standing - Comment/# of Minutes: Stnading to complete clothing management Dynamic Standing Balance Dynamic Standing - Balance Support: During functional activity Dynamic Standing - Level of Assistance: 3: Mod assist Extremity/Trunk Assessment RUE Assessment RUE Assessment: Within Functional Limits LUE Assessment LUE  Assessment: Within Functional Limits (4/5 throughout)   See Function Navigator for Current Functional Status.   Refer to Care Plan for Long Term Goals  Recommendations for other services: Therapeutic Recreation  Pet therapy, Kitchen group and Outing/community reintegration   Discharge Criteria: Patient will be discharged from OT if patient refuses treatment 3 consecutive times without medical reason, if treatment goals not met, if there is a change in medical status, if patient makes no progress towards goals or if patient is discharged from hospital.  The above assessment, treatment plan, treatment alternatives and goals were discussed and mutually agreed upon: by patient  Ernestina Patches 01/29/2017, 12:56 PM

## 2017-01-29 NOTE — Care Management Note (Signed)
Inpatient Rehabilitation Center Individual Statement of Services  Patient Name:  Andrea [name]  Date:  01/29/2017  Welcome to the Inpatient Rehabilitation Center.  Our goal is to provide you with an individualized program based on your diagnosis and situation, designed to meet your specific needs.  With this comprehensive rehabilitation program, you will be expected to participate in at least 3 hours of rehabilitation therapies Monday-Friday, with modified therapy programming on the weekends.  Your rehabilitation program will include the following services:  Physical Therapy (PT), Occupational Therapy (OT), Speech Therapy (ST), 24 hour per day rehabilitation nursing, Case Management (Social Worker), Rehabilitation Medicine, Nutrition Services and Pharmacy Services  Weekly team conferences will be held on Wednesday to discuss your progress.  Your Social Worker will talk with you frequently to get your input and to update you on team discussions.  Team conferences with you and your family in attendance may also be held.  Expected length of stay: 10-14 days  Overall anticipated outcome: supervision set-up  Depending on your progress and recovery, your program may change. Your Social Worker will coordinate services and will keep you informed of any changes. Your Social Worker's name and contact numbers are listed  below.  The following services may also be recommended but are not provided by the Inpatient Rehabilitation Center:   Driving Evaluations  Home Health Rehabiltiation Services  Outpatient Rehabilitation Services    Arrangements will be made to provide these services after discharge if needed.  Arrangements include referral to agencies that provide these services.  Your insurance has been verified to be:  Medicare & Medicaid [facility]  Pertinent information will be shared with your doctor and your insurance company.  Social Worker:   Dossie DerBecky Neida Ellegood, SW [phone] or [phone]) [phone]  Information discussed with and copy given to patient by: Lucy Chrisupree, Annaliza Zia G, 01/29/2017, 12:32 PM

## 2017-01-29 NOTE — Patient Care Conference (Signed)
Inpatient RehabilitationTeam Conference and Plan of Care Update Date: 01/29/2017   Time: 11:30 AM    Patient Name: Andrea [name]      Medical Record Number: [id number]  Date of Birth: 09/20/1925 Sex: Female         Room/Bed: 4M01C/4M01C-01 Payor Info: Payor: MEDICARE / Plan: MEDICARE PART A AND B / Product Type: *No Product type* /    Admitting Diagnosis: R CVA  Admit Date/Time:  01/28/2017  3:34 PM Admission Comments: No comment available   Primary Diagnosis:  <principal problem not specified> Principal Problem: <principal problem not specified>  Patient Active Problem List   Diagnosis Date Noted  . Right thalamic infarction (HCC) 01/28/2017  . Thalamic stroke (HCC)   . TIA (transient ischemic attack) 01/25/2017  . Hypernatremia 01/25/2017  . CVA (cerebral vascular accident) (HCC) 01/25/2017  . Intertrochanteric fracture of left hip (HCC) 09/01/2014  . Hypothyroidism 09/01/2014  . Hypertension 09/01/2014  . Closed left hip fracture (HCC) 09/01/2014    Expected Discharge Date:    Team Members Present: Physician leading conference: Dr. Claudette LawsAndrew Kirsteins Social Worker Present: Dossie DerBecky Ahava Kissoon, LCSW Nurse Present: Chana Bodeeborah Sharp, RN PT Present: Teodoro Kilaitlin Penven-Crew, PT OT Present: Kearney HardElisabeth Doe, OT SLP Present: Feliberto Gottronourtney Payne, SLP PPS Coordinator present : Tora DuckMarie Noel, RN, CRRN     Current Status/Progress Goal Weekly Team Focus  Medical     Adjusting medications monitoring BS and BP        Bowel/Bladder        Cont B & B     Swallow/Nutrition/ Hydration             ADL's   New eval        Mobility     eval pending        Communication             Safety/Cognition/ Behavioral Observations  Eval Pending          Pain             Skin                *See Care Plan and progress notes for long and short-term goals.     Barriers to Discharge  Current Status/Progress Possible Resolutions Date Resolved   Physician                    Nursing                  PT  Inaccessible home environment;Decreased caregiver support;Home environment access/layout                 OT                  SLP                SW Decreased caregiver support Supervision level only per daughter            Discharge Planning/Teaching Needs:    Hoping to go home if can get to supervision-mod/i level, daughter can only do supervision level     Team Discussion:  New evaluations-goals being set. Only seen PT-10-14 days ELOS. Medically adjusting medications  Revisions to Treatment Plan: New evaluation        Lucy ChrisDupree, Kamdon Reisig G 01/29/2017, 12:47 PM

## 2017-01-29 NOTE — Progress Notes (Signed)
Team Conference today. Pt resting In bed quietly. Easily aroused. Denies any pain at this time. Admitted for CVA R brain L body with noted left sided weakness. Continent with incontinent episodes and malodorous urine. Increased confusion [facility] at [facility] and continues to be able to follow commands. Swallows pills whole with thin liquids noted. Partial bath completed with pericare. Safety maintained. Will continue to monitor.

## 2017-01-29 NOTE — Evaluation (Signed)
Speech Language Pathology Assessment and Plan  Patient Details  Name: Andrea [name] MRN: [id number] Date of Birth: [birth year]  SLP Diagnosis: Cognitive Impairments  Rehab Potential: Excellent ELOS: 10-14 days    Today's Date: 01/29/2017 SLP Individual Time: 1400-1500 SLP Individual Time Calculation (min): 60 min   Problem List:  Patient Active Problem List   Diagnosis Date Noted  . Right thalamic infarction ([place]) 01/28/2017  . Thalamic stroke ([place])   . TIA (transient ischemic attack) 01/25/2017  . Hypernatremia 01/25/2017  . CVA (cerebral vascular accident) ([place]) 01/25/2017  . Intertrochanteric fracture of left hip ([place]) 09/01/2014  . Hypothyroidism 09/01/2014  . Hypertension 09/01/2014  . Closed left hip fracture ([place]) 09/01/2014   Past Medical History:  Past Medical History:  Diagnosis Date  . Hypertension   . Thyroid disease    Past Surgical History:  Past Surgical History:  Procedure Laterality Date  . ABDOMINAL HYSTERECTOMY    . ORIF HIP FRACTURE Left 09/02/2014   Procedure: OPEN REDUCTION INTERNAL FIXATION LEFT HIP FRACTURE;  Surgeon: Sanjuana Kava, MD;  Location: [facility];  Service: Orthopedics;  Laterality: Left;    Assessment / Plan / Recommendation Clinical Impression 83 year old right handed female with past medical history of hypertension, recurrent UTIs,hypothyroidism.left hip fracture with ORIF June 2016. Per chart review patient lives with daughter independent with a caneon occasionprior to admission driving, shopping and doing her own finances. One level home with 3 steps to entry.She also has very good assistance from a neighbor.Presented 01/25/2017 with left-sided weakness and numbness. Blood pressure 209/96. Troponin negative. Cranial CT scan negative. Patient did not receive TPA. MRI showed acute right thalamic infarction. MRA with no large vessel occlusion. Carotid Doppler no ICA stenosis. Echocardiogram with ejection fraction 65% no wall motion  abnormalities. Maintained on aspirin for CVA prophylaxis. Subcutaneous Lovenox for DVT prophylaxis. Elevated TSH 31.569 with free T4 and T3 pending.. Patient currently remains on Synthroid and plans to follow-up thyroid panel in 2weeks. Tolerating a regular diet consistency. Physical and occupational therapy evaluations completed with recommendations of physical medicine rehabilitation consult. Patient admitted 01/28/17.  Patient administered the MoCA (version 7.2). Patient scored 23/30 points with a score of 26 or above with mild impairments in recall and complex problem solving. Patient would benefit from skilled SLP intervention to maximize her cognitive functioning and overall functional independence prior to discharge.    Skilled Therapeutic Interventions          Administered a cognitive-linguistic evaluation. Please see above for details. Patient recalled events from previous therapy sessions with supervision verbal cues. Patient left upright in bed with all needs within reach. Continue with current plan of care.   SLP Assessment  Patient will need skilled [facility] during CIR admission    Recommendations  Oral Care Recommendations: Oral care BID Recommendations for Other Services: Neuropsych consult Patient destination: Home Follow up Recommendations:  (TBD) Equipment Recommended: None recommended by SLP    SLP Frequency 3 to 5 out of 7 days   SLP Duration  SLP Intensity  SLP Treatment/Interventions 10-14 days  Minumum of 1-2 x/day, 30 to 90 minutes  Cognitive remediation/compensation;Cueing hierarchy;Functional tasks;Patient/family education;Therapeutic Activities;Environmental controls;Internal/external aids    Pain No/Denies pain   Function:    Cognition Comprehension Comprehension assist level: Follows complex conversation/direction with extra time/assistive device  Expression   Expression assist level: Expresses complex ideas: With extra  time/assistive device  Social Interaction Social Interaction assist level: Interacts appropriately 90% of the time - Needs monitoring or encouragement  for participation or interaction.  Problem Solving Problem solving assist level: Solves basic problems with no assist  Memory Memory assist level: Recognizes or recalls 50 - 74% of the time/requires cueing 25 - 49% of the time   Short Term Goals: Week 1: SLP Short Term Goal 1 (Week 1): STGs=LTGs  Refer to Care Plan for Long Term Goals  Recommendations for other services: Neuropsych  Discharge Criteria: Patient will be discharged from SLP if patient refuses treatment 3 consecutive times without medical reason, if treatment goals not met, if there is a change in medical status, if patient makes no progress towards goals or if patient is discharged from hospital.  The above assessment, treatment plan, treatment alternatives and goals were discussed and mutually agreed upon: by patient  Darris Carachure 01/29/2017, 3:25 PM

## 2017-01-29 NOTE — Progress Notes (Signed)
Subjective/Complaints:   Objective: Vital Signs: Blood pressure (!) 158/70, pulse 77, temperature 98 F (36.7 C), temperature source Oral, resp. rate 18, height '5\' 4"'$  (1.626 m), weight 80 kg (176 lb 4.8 oz), SpO2 98 %. No results found. Results for orders placed or performed during the hospital encounter of 01/28/17 (from the past 72 hour(s))  CBC     Status: Abnormal   Collection Time: 01/28/17  4:37 PM  Result Value Ref Range   WBC 9.6 4.0 - 10.5 K/uL   RBC 4.92 3.87 - 5.11 MIL/uL   Hemoglobin 15.9 (H) 12.0 - 15.0 g/dL   HCT 46.1 (H) 36.0 - 46.0 %   MCV 93.7 78.0 - 100.0 fL   MCH 32.3 26.0 - 34.0 pg   MCHC 34.5 30.0 - 36.0 g/dL   RDW 13.9 11.5 - 15.5 %   Platelets 377 150 - 400 K/uL  Creatinine, serum     Status: Abnormal   Collection Time: 01/28/17  4:37 PM  Result Value Ref Range   Creatinine, Ser 1.19 (H) 0.44 - 1.00 mg/dL   GFR calc non Af Amer 39 (L) >60 mL/min   GFR calc Af Amer 46 (L) >60 mL/min    Comment: (NOTE) The eGFR has been calculated using the CKD EPI equation. This calculation has not been validated in all clinical situations. eGFR's persistently <60 mL/min signify possible Chronic Kidney Disease.   CBC WITH DIFFERENTIAL     Status: Abnormal   Collection Time: 01/29/17  5:29 AM  Result Value Ref Range   WBC 8.4 4.0 - 10.5 K/uL   RBC 5.10 3.87 - 5.11 MIL/uL   Hemoglobin 16.3 (H) 12.0 - 15.0 g/dL   HCT 47.4 (H) 36.0 - 46.0 %   MCV 92.9 78.0 - 100.0 fL   MCH 32.0 26.0 - 34.0 pg   MCHC 34.4 30.0 - 36.0 g/dL   RDW 13.7 11.5 - 15.5 %   Platelets 380 150 - 400 K/uL   Neutrophils Relative % 63 %   Neutro Abs 5.3 1.7 - 7.7 K/uL   Lymphocytes Relative 28 %   Lymphs Abs 2.3 0.7 - 4.0 K/uL   Monocytes Relative 9 %   Monocytes Absolute 0.8 0.1 - 1.0 K/uL   Eosinophils Relative 0 %   Eosinophils Absolute 0.0 0.0 - 0.7 K/uL   Basophils Relative 0 %   Basophils Absolute 0.0 0.0 - 0.1 K/uL  Comprehensive metabolic panel     Status: Abnormal   Collection  Time: 01/29/17  5:29 AM  Result Value Ref Range   Sodium 137 135 - 145 mmol/L   Potassium 3.9 3.5 - 5.1 mmol/L   Chloride 102 101 - 111 mmol/L   CO2 23 22 - 32 mmol/L   Glucose, Bld 131 (H) 65 - 99 mg/dL   BUN 30 (H) 6 - 20 mg/dL   Creatinine, Ser 1.28 (H) 0.44 - 1.00 mg/dL   Calcium 9.8 8.9 - 10.3 mg/dL   Total Protein 7.2 6.5 - 8.1 g/dL   Albumin 3.9 3.5 - 5.0 g/dL   AST 46 (H) 15 - 41 U/L   ALT 25 14 - 54 U/L   Alkaline Phosphatase 92 38 - 126 U/L   Total Bilirubin 1.0 0.3 - 1.2 mg/dL   GFR calc non Af Amer 36 (L) >60 mL/min   GFR calc Af Amer 42 (L) >60 mL/min    Comment: (NOTE) The eGFR has been calculated using the CKD EPI equation. This calculation has not been  validated in all clinical situations. eGFR's persistently <60 mL/min signify possible Chronic Kidney Disease.    Anion gap 12 5 - 15     HEENT: normal Cardio: RRR and no murmur Resp: CTA B/L and unlabored GI: BS positive and NT, ND Extremity:  No Edema Skin:   Other Left lat leg blood filled bulla Neuro: Alert/Oriented, Abnormal Sensory redueced LT left foot, Abnormal Motor 4/5 Left delt, bi, tri, grip, HF, KE, ADF and Abnormal FMC Ataxic/ dec FMC Musc/Skel:  Bilateral knee Arthritic changes, bilateral hand PIP DIP arthritic changes Gen NAD   Assessment/Plan: 1. Functional deficits secondary to Right thalamic infarct which require 3+ hours per day of interdisciplinary therapy in a comprehensive inpatient rehab setting. Physiatrist is providing close team supervision and 24 hour management of active medical problems listed below. Physiatrist and rehab team continue to assess barriers to discharge/monitor patient progress toward functional and medical goals. FIM:                                  Medical Problem List and Plan: 1. Left hemiparesissecondary to right thalamic infarction -CIR PT, OT evals 2. DVT Prophylaxis/Anticoagulation: Subcutaneous Lovenox. Monitor for any  bleeding episodes Plt 380 3. Pain Management: Tylenol as needed 4. Mood: Provide emotional support 5. Neuropsych: This patient iscapable of making decisions on herown behalf. 6. Skin/Wound Care: Routine skin checks -local skin tear to abrasions 7. Fluids/Electrolytes/Nutrition: Routine I&O's with follow-up chemistries 8.Hypertension. Maxide 37.5-25 mg daily. Monitor with increased mobility Vitals:   01/28/17 1540 01/29/17 0442  BP: 105/67 (!) 158/70  Pulse: 76 77  Resp: 18 18  Temp: 98.4 F (36.9 C) 98 F (36.7 C)  SpO2: 95% 98%   9.Hyperlipidemia. Lipitor. 10.Hypothyroidism. Synthroid. TSH 31.569. Plans to follow-up thyroid panels in 2weeks  LOS (Days) 1 A FACE TO FACE EVALUATION WAS PERFORMED  KIRSTEINS,ANDREW E 01/29/2017, 7:17 AM

## 2017-01-30 ENCOUNTER — Inpatient Hospital Stay (HOSPITAL_COMMUNITY): Payer: Medicare Other

## 2017-01-30 ENCOUNTER — Inpatient Hospital Stay (HOSPITAL_COMMUNITY): Payer: Medicare Other | Admitting: Occupational Therapy

## 2017-01-30 ENCOUNTER — Inpatient Hospital Stay (HOSPITAL_COMMUNITY): Payer: Medicare Other | Admitting: Speech Pathology

## 2017-01-30 DIAGNOSIS — G8194 Hemiplegia, unspecified affecting left nondominant side: Secondary | ICD-10-CM

## 2017-01-30 MED ORDER — AMLODIPINE BESYLATE 2.5 MG PO TABS
2.5000 mg | ORAL_TABLET | Freq: Every day | ORAL | Status: DC
  Administered 2017-01-30 – 2017-02-07 (×9): 2.5 mg via ORAL
  Filled 2017-01-30 (×9): qty 1

## 2017-01-30 NOTE — Progress Notes (Signed)
Occupational Therapy Session Note  Patient Details  Name: Andrea [name] MRN: [id number] Date of Birth: 02/06/1926  Today's Date: 01/30/2017 OT Individual Time: [id number] OT Individual Time Calculation (min): 60 min    Short Term Goals: Week 1:  OT Short Term Goal 1 (Week 1): Pt will complete 2/3 toileting tasks with steadying assist OT Short Term Goal 2 (Week 1): Pt will don socks/shoes with min A OT Short Term Goal 3 (Week 1): Pt will complete grooming task in standing with supervision  Skilled Therapeutic Interventions/Progress Updates:    pt seen for ADL training with a focus on active use of LUE and balance. Pt received in bed, sat to EOB with min A and then stood up with cues to push through BUE then reach for the walker.  She completed stand pivot to wc and then w/c to shower bench with min-mod A.   Sat on bench and was able to effectively use LUE to wash R arm by looking at her hand and she was able to bend forward to wash her feet.  In standing, pt used R hand to wash bottom but had a LOB with mod A to recover.  Transferred  Back to w/c to dress. Good use of L hand in bimanual task of donning under wear and pants but had much more difficulty with her socks.  Worked on sit to stand and balance from w/c to walker with cues for feet positioning. Pt participated well. Pt in room with RN at end of the session.   Therapy Documentation Precautions:  Precautions Precautions: Fall Restrictions Weight Bearing Restrictions: No    Pain: Pain Assessment Pain Assessment: No/denies pain ADL:    See Function Navigator for Current Functional Status.   Therapy/Group: Individual Therapy  Valori Hollenkamp 01/30/2017, 12:12 PM

## 2017-01-30 NOTE — Progress Notes (Signed)
Subjective/Complaints: No issues overnite, discussed bloodwork and need to drink more fluid Denies dizziness  ROS- No CP, SOB, N/V/D  Objective: Vital Signs: Blood pressure (!) 153/62, pulse 66, temperature 98 F (36.7 C), temperature source Oral, resp. rate 18, height 5' 4"  (1.626 m), weight 80 kg (176 lb 4.8 oz), SpO2 98 %. No results found. Results for orders placed or performed during the hospital encounter of 01/28/17 (from the past 72 hour(s))  CBC     Status: Abnormal   Collection Time: 01/28/17  4:37 PM  Result Value Ref Range   WBC 9.6 4.0 - 10.5 K/uL   RBC 4.92 3.87 - 5.11 MIL/uL   Hemoglobin 15.9 (H) 12.0 - 15.0 g/dL   HCT 46.1 (H) 36.0 - 46.0 %   MCV 93.7 78.0 - 100.0 fL   MCH 32.3 26.0 - 34.0 pg   MCHC 34.5 30.0 - 36.0 g/dL   RDW 13.9 11.5 - 15.5 %   Platelets 377 150 - 400 K/uL  Creatinine, serum     Status: Abnormal   Collection Time: 01/28/17  4:37 PM  Result Value Ref Range   Creatinine, Ser 1.19 (H) 0.44 - 1.00 mg/dL   GFR calc non Af Amer 39 (L) >60 mL/min   GFR calc Af Amer 46 (L) >60 mL/min    Comment: (NOTE) The eGFR has been calculated using the CKD EPI equation. This calculation has not been validated in all clinical situations. eGFR's persistently <60 mL/min signify possible Chronic Kidney Disease.   CBC WITH DIFFERENTIAL     Status: Abnormal   Collection Time: 01/29/17  5:29 AM  Result Value Ref Range   WBC 8.4 4.0 - 10.5 K/uL   RBC 5.10 3.87 - 5.11 MIL/uL   Hemoglobin 16.3 (H) 12.0 - 15.0 g/dL   HCT 47.4 (H) 36.0 - 46.0 %   MCV 92.9 78.0 - 100.0 fL   MCH 32.0 26.0 - 34.0 pg   MCHC 34.4 30.0 - 36.0 g/dL   RDW 13.7 11.5 - 15.5 %   Platelets 380 150 - 400 K/uL   Neutrophils Relative % 63 %   Neutro Abs 5.3 1.7 - 7.7 K/uL   Lymphocytes Relative 28 %   Lymphs Abs 2.3 0.7 - 4.0 K/uL   Monocytes Relative 9 %   Monocytes Absolute 0.8 0.1 - 1.0 K/uL   Eosinophils Relative 0 %   Eosinophils Absolute 0.0 0.0 - 0.7 K/uL   Basophils Relative 0 %    Basophils Absolute 0.0 0.0 - 0.1 K/uL  Comprehensive metabolic panel     Status: Abnormal   Collection Time: 01/29/17  5:29 AM  Result Value Ref Range   Sodium 137 135 - 145 mmol/L   Potassium 3.9 3.5 - 5.1 mmol/L   Chloride 102 101 - 111 mmol/L   CO2 23 22 - 32 mmol/L   Glucose, Bld 131 (H) 65 - 99 mg/dL   BUN 30 (H) 6 - 20 mg/dL   Creatinine, Ser 1.28 (H) 0.44 - 1.00 mg/dL   Calcium 9.8 8.9 - 10.3 mg/dL   Total Protein 7.2 6.5 - 8.1 g/dL   Albumin 3.9 3.5 - 5.0 g/dL   AST 46 (H) 15 - 41 U/L   ALT 25 14 - 54 U/L   Alkaline Phosphatase 92 38 - 126 U/L   Total Bilirubin 1.0 0.3 - 1.2 mg/dL   GFR calc non Af Amer 36 (L) >60 mL/min   GFR calc Af Amer 42 (L) >60 mL/min  Comment: (NOTE) The eGFR has been calculated using the CKD EPI equation. This calculation has not been validated in all clinical situations. eGFR's persistently <60 mL/min signify possible Chronic Kidney Disease.    Anion gap 12 5 - 15     HEENT: normal Cardio: RRR and no murmur Resp: CTA B/L and unlabored GI: BS positive and NT, ND Extremity:  No Edema Skin:   Other Left lat leg blood filled bulla Neuro: Alert/Oriented, Abnormal Sensory redueced LT left foot, Abnormal Motor 4/5 Left delt, bi, tri, grip, HF, KE, ADF and Abnormal FMC Ataxic/ dec FMC Musc/Skel:  Bilateral knee Arthritic changes, bilateral hand PIP DIP arthritic changes Gen NAD   Assessment/Plan: 1. Functional deficits secondary to Right thalamic infarct which require 3+ hours per day of interdisciplinary therapy in a comprehensive inpatient rehab setting. Physiatrist is providing close team supervision and 24 hour management of active medical problems listed below. Physiatrist and rehab team continue to assess barriers to discharge/monitor patient progress toward functional and medical goals. FIM: Function - Bathing Position: Shower Body parts bathed by patient: Right arm, Right upper leg, Left arm, Left upper leg, Chest, Abdomen, Front  perineal area Body parts bathed by helper: Right lower leg, Left lower leg, Back, Buttocks Assist Level: Touching or steadying assistance(Pt > 75%)  Function- Upper Body Dressing/Undressing What is the patient wearing?: Pull over shirt/dress Pull over shirt/dress - Perfomed by patient: Thread/unthread right sleeve, Thread/unthread left sleeve, Put head through opening Pull over shirt/dress - Perfomed by helper: Pull shirt over trunk Function - Lower Body Dressing/Undressing What is the patient wearing?: Underwear, Pants, Socks, Shoes Position: Wheelchair/chair at [employer] - Performed by patient: Thread/unthread right underwear leg, Thread/unthread left underwear leg, Pull underwear up/down Pants- Performed by patient: Thread/unthread right pants leg, Thread/unthread left pants leg, Pull pants up/down Socks - Performed by helper: Don/doff right sock, Don/doff left sock Shoes - Performed by helper: Don/doff right shoe, Don/doff left shoe, Fasten right, Fasten left Assist for footwear: Maximal assist Assist for lower body dressing: Touching or steadying assistance (Pt > 75%)  Function - Toileting Toileting steps completed by helper: Adjust clothing after toileting, Performs perineal hygiene, Adjust clothing prior to toileting  Function - Toilet Transfers Toilet transfer assistive device: Bedside commode Assist level to toilet: Touching or steadying assistance (Pt > 75%) Assist level from toilet: Touching or steadying assistance (Pt > 75%) Assist level to bedside commode (at bedside): Touching or steadying assistance (Pt > 75%) Assist level from bedside commode (at bedside): Touching or steadying assistance (Pt > 75%)  Function - Chair/bed transfer Chair/bed transfer method: Stand pivot Chair/bed transfer assist level: Moderate assist (Pt 50 - 74%/lift or lower) Chair/bed transfer assistive device: Armrests  Function - Locomotion: Wheelchair Will patient use wheelchair at  discharge?: No Type: Manual Max wheelchair distance: 4f  Assist Level: Moderate assistance (Pt 50 - 74%) Assist Level: Moderate assistance (Pt 50 - 74%) Wheel 150 feet activity did not occur: Safety/medical concerns Turns around,maneuvers to table,bed, and toilet,negotiates 3% grade,maneuvers on rugs and over doorsills: No Function - Locomotion: Ambulation Assistive device: Walker-rolling Max distance: 863fAssist level: Touching or steadying assistance (Pt > 75%) Assist level: Touching or steadying assistance (Pt > 75%) Assist level: Touching or steadying assistance (Pt > 75%) Walk 150 feet activity did not occur: Safety/medical concerns Walk 10 feet on uneven surfaces activity did not occur: Safety/medical concerns  Function - Comprehension Comprehension: Auditory Comprehension assist level: Follows complex conversation/direction with extra time/assistive device  Function -  Expression Expression: Verbal Expression assist level: Expresses complex ideas: With extra time/assistive device  Function - Social Interaction Social Interaction assist level: Interacts appropriately 90% of the time - Needs monitoring or encouragement for participation or interaction.  Function - Problem Solving Problem solving assist level: Solves basic problems with no assist  Function - Memory Memory assist level: Recognizes or recalls 50 - 74% of the time/requires cueing 25 - 49% of the time Patient normally able to recall (first 3 days only): Current season, Location of own room, Staff names and faces, That he or she is in a hospital  Medical Problem List and Plan: 1. Left hemiparesissecondary to right thalamic infarction -CIR PT, OT  2. DVT Prophylaxis/Anticoagulation: Subcutaneous Lovenox. Monitor for any bleeding episodes Plt 380 3. Pain Management: Tylenol as needed 4. Mood: Provide emotional support 5. Neuropsych: This patient iscapable of making decisions on herown  behalf. 6. Skin/Wound Care: Routine skin checks -local skin tear to abrasions 7. Fluids/Electrolytes/Nutrition: Routine I&O's repeat BMET in am 8.Hypertension. Maxide 37.5-25 mg daily.  Elevated BUN and creat will enc po change maxide to amlodipine Vitals:   01/29/17 1531 01/30/17 0550  BP: 114/64 (!) 153/62  Pulse: 80 66  Resp: 16 18  Temp: 98.7 F (37.1 C) 98 F (36.7 C)  SpO2: 98% 98%   9.Hyperlipidemia. Lipitor. 10.Hypothyroidism. Synthroid. TSH 31.569. Plans to follow-up thyroid panels in 2weeks  LOS (Days) 2 A FACE TO FACE EVALUATION WAS PERFORMED  Andrea [name] E 01/30/2017, 7:00 AM

## 2017-01-30 NOTE — Progress Notes (Signed)
Speech Language Pathology Daily Session Note  Patient Details  Name: Andrea [name] MRN: [id number] Date of Birth: 05/06/1925  Today's Date: 01/30/2017 SLP Individual Time: 0930-1030 SLP Individual Time Calculation (min): 60 min  Short Term Goals: Week 1: SLP Short Term Goal 1 (Week 1): STGs=LTGs  Skilled Therapeutic Interventions: Skilled treatment session focused on cognitive goals. Patient performed basic and mildly complex money management tasks with extra time and overall Mod I. SLP also facilitated session by providing Min A verbal cues for recall of her current medications and their functions. Will attempt organizing a pill box during next session. Patient left upright in wheelchair with all needs within reach. Continue with current plan of care.      Function:  Cognition Comprehension Comprehension assist level: Follows complex conversation/direction with extra time/assistive device  Expression   Expression assist level: Expresses complex ideas: With extra time/assistive device  Social Interaction Social Interaction assist level: Interacts appropriately with others - No medications needed.  Problem Solving Problem solving assist level: Solves complex problems: With extra time  Memory Memory assist level: Recognizes or recalls 90% of the time/requires cueing < 10% of the time    Pain No/Denies Pain  Therapy/Group: Individual Therapy  Reesha Debes 01/30/2017, 12:33 PM

## 2017-01-30 NOTE — Progress Notes (Signed)
Pt resting in bed quietly. Easily aroused. C/o pain in the bilat arms. Pain management administered per order and effective. Able to make needs known. Continent with incontinent episodes noted. Assist x 1 to bsc. callbell within reach. Safety maintained. Uneventful night. Will continue to monitor.

## 2017-01-30 NOTE — Progress Notes (Signed)
Physical Therapy Session Note  Patient Details  Name: Andrea [name] MRN: [id number] Date of Birth: 03/04/1926  Today's Date: 01/30/2017 PT Individual Time: 1410-1527 PT Individual Time Calculation (min): 77 min   Short Term Goals: Week 1:  PT Short Term Goal 1 (Week 1): Pt will perform sit <> stands with min assist consistently PT Short Term Goal 2 (Week 1): Pt will be able to gait with min assist with LRAD x 150' PT Short Term Goal 3 (Week 1): Pt will be able to perform 4 steps with rails for functional strengthening with min assist  Skilled Therapeutic Interventions/Progress Updates:    Functional bed mobility with steadying assist at trunk and cues for technique for supine -> sit; able to do sit to supine with supervision. Mod assist for initial sit -> stand from EOB with facilitation for weightshift anteriorly. Initial mod assist for gait into bathroom due to decreased ability for clearance of LLE and over uneven surface progressing to min assist as pt continues mobility. Toileting needs completed with min assist for balance for clothing management and mod assist for transfer off of toilet with RW. Gait training with RW on unit x 100' with min assist and improved LLE clearance as increasing mobility and occasional assist for balance with turns. NMR for coordination, postural control re-training, transitional movements, and balance during blocked practice sit <> stands without UE support with focus on anterior weighshift, toe taps to 4" step with focus on hitting same target (on L), and trunk and pelvic dissociation while reaching for cups in all directions (focus on coordination and grasp of LUE) and nustep x 11 min on level 3. Returned to room via w/c and transferred back to bed with RW - pt requires mod assist for sit -> stand but min assist to complete transfer.   Therapy Documentation Precautions:  Precautions Precautions: Fall Restrictions Weight Bearing Restrictions: No  Pain: Pain  Assessment Pain Assessment: No/denies pain    See Function Navigator for Current Functional Status.   Therapy/Group: Individual Therapy  Karolee StampsGray, Linette Gunderson Darrol PokeBrescia  Merica Prell B. Lillyana Majette, PT, DPT  01/30/2017, 3:34 PM

## 2017-01-31 ENCOUNTER — Inpatient Hospital Stay (HOSPITAL_COMMUNITY): Payer: Medicare Other | Admitting: Occupational Therapy

## 2017-01-31 ENCOUNTER — Inpatient Hospital Stay (HOSPITAL_COMMUNITY): Payer: Medicare Other | Admitting: Speech Pathology

## 2017-01-31 ENCOUNTER — Inpatient Hospital Stay (HOSPITAL_COMMUNITY): Payer: Medicare Other | Admitting: Physical Therapy

## 2017-01-31 DIAGNOSIS — M170 Bilateral primary osteoarthritis of knee: Secondary | ICD-10-CM

## 2017-01-31 LAB — BASIC METABOLIC PANEL
Anion gap: 10 (ref 5–15)
BUN: 29 mg/dL — AB (ref 6–20)
CHLORIDE: 105 mmol/L (ref 101–111)
CO2: 23 mmol/L (ref 22–32)
Calcium: 9.5 mg/dL (ref 8.9–10.3)
Creatinine, Ser: 1.19 mg/dL — ABNORMAL HIGH (ref 0.44–1.00)
GFR calc Af Amer: 46 mL/min — ABNORMAL LOW (ref >60)
GFR, EST NON AFRICAN AMERICAN: 39 mL/min — AB (ref >60)
GLUCOSE: 121 mg/dL — AB (ref 65–99)
POTASSIUM: 3.9 mmol/L (ref 3.5–5.1)
Sodium: 138 mmol/L (ref 135–145)

## 2017-01-31 LAB — TROPONIN I: Troponin I: <0.03 ng/mL (ref <0.03)

## 2017-01-31 MED ORDER — DICLOFENAC SODIUM 1 % TD GEL
4.0000 g | Freq: Four times a day (QID) | TRANSDERMAL | Status: DC
Start: 2017-01-31 — End: 2017-02-07
  Administered 2017-01-31 – 2017-02-07 (×26): 4 g via TOPICAL
  Filled 2017-01-31: qty 100

## 2017-01-31 NOTE — Progress Notes (Signed)
Occupational Therapy Session Note  Patient Details  Name: Andrea [name] MRN: [id number] Date of Birth: 04/02/1925  Today's Date: 01/31/2017 OT Individual Time: 1100-1132 OT Individual Time Calculation (min): 32 min    Short Term Goals: Week 1:  OT Short Term Goal 1 (Week 1): Pt will complete 2/3 toileting tasks with steadying assist OT Short Term Goal 2 (Week 1): Pt will don socks/shoes with min A OT Short Term Goal 3 (Week 1): Pt will complete grooming task in standing with supervision  Skilled Therapeutic Interventions/Progress Updates:    Pt completed transfer from wheelchair to therapy mat for session with mod assist.  Once sitting had pt focus on maintaining upright posture and anterior pelvic tilt while engaged in LUE coordination tasks.  She tends to fall into posterior pelvic tilt with thoracic rounding unless given mod demonstrational cueing and min assist.  Had pt complete nine hole peg test while sitting.  She was successful in 39 seconds using the right hand but needed 4 mins and 23 seconds on the right secondary to ataxia and decreased coordination. Progressed to having pt use the LUE for picking up and placing checkers in small cup as well.  Educated pt and caregiver on FM coordination tasks that could be completed on her own (tying, buttoning, sorting cards, picking up checkers).  Issued checkers as well for pt to take back to the room and work on in between therapies.  Pt able to pick up checkers with increased time but demonstrates decreased coordination and ability to perform tip to tip pinch.  Completed stand pivot transfer back to the wheelchair with return back to room.  Call button and phone in reach at bedside.  Therapy Documentation Precautions:  Precautions Precautions: Fall Restrictions Weight Bearing Restrictions: No  Pain: Pain Assessment Pain Assessment: No/denies pain ADL: See Function Navigator for Current Functional Status.   Therapy/Group: Individual  Therapy  Andrea [name] OTR/L 01/31/2017, 12:25 PM

## 2017-01-31 NOTE — Progress Notes (Signed)
Speech Language Pathology Session Note & Discharge Summary  Patient Details  Name: Andrea [name] MRN: [id number] Date of Birth: 1925/12/09  Today's Date: 01/31/2017 SLP Individual Time: 0930-1030 SLP Individual Time Calculation (min): 60 min   Skilled Therapeutic Interventions: Skilled treatment session focused on cognitive goals. Patient organized a BID pill box with Mod I and also participated in a mildly complex, calendar making task with mod I. Patient's caregiver present and educated on patient's current cognitive functioning. He verbalized understanding and reported he felt the patient was at her cognitive baseline, therefore patient will be d/c from skilled SLP intervention.   Patient has met 2 of 2 long term goals.  Patient to discharge at overall Modified Independent level.   Reasons goals not met: N/A   Clinical Impression/Discharge Summary: Patient has made excellent gains and has met 2 of 2 LTG's this admission. Currently, patient is overall Mod I for completing functional and mildly tasks safely in regards to recall and problem solving. Caregiver education is complete and patient will be discharged from skilled SLP intervention secondary to being at her baseline level of cognitive functioning. Skilled SLP f/u is not warranted at this time.   Recommendation:  None      Equipment: N/A   Reasons for discharge: Treatment goals met   Patient/Family Agrees with Progress Made and Goals Achieved: Yes   Function:  Cognition Comprehension Comprehension assist level: Follows complex conversation/direction with extra time/assistive device  Expression   Expression assist level: Expresses complex ideas: With extra time/assistive device  Social Interaction Social Interaction assist level: Interacts appropriately with others with medication or extra time (anti-anxiety, antidepressant).  Problem Solving Problem solving assist level: Solves complex problems: With extra time  Memory  Memory assist level: More than reasonable amount of time   Eulalio Reamy 01/31/2017, 3:13 PM

## 2017-01-31 NOTE — IPOC Note (Signed)
Overall Plan of Care [facility]) Patient Details Name: Andrea [name] MRN: [id number] DOB: 1925-12-17  Admitting Diagnosis: <principal problem not specified>  Hospital Problems: Active Problems:   Thalamic stroke (HCC)   Right thalamic infarction [facility])     Functional Problem List: Nursing Endurance, Medication Management, Safety, Skin Integrity  PT Balance, Edema, Motor, Endurance, Nutrition, Pain, Safety, Sensory, Skin Integrity  OT Balance, Endurance, Safety, Motor, Sensory  SLP    TR         Basic ADL's: OT Eating, Grooming, Bathing, Dressing, Toileting     Advanced  ADL's: OT Simple Meal Preparation, Laundry, Light Housekeeping     Transfers: PT Bed Mobility, Bed to Chair, Car, [employer], Dietitian, Technical brewer: PT Ambulation, Stairs, Psychologist, prison and probation services     Additional Impairments: OT Fuctional Use of Upper Extremity  SLP Social Cognition   Memory, Problem Solving  TR      Anticipated Outcomes Item Anticipated Outcome  Self Feeding Mod I  Swallowing      Basic self-care  Supervision-mod I  Toileting  Mod I   Bathroom Transfers Mod I  Bowel/Bladder     Transfers  Mod I with LRAD   Locomotion  Ambulatory with supervision assist and LRAD   Communication     Cognition  Mod I   Pain     Safety/Judgment  Maintain safety with cues/reminders   Therapy Plan: PT Intensity: Minimum of 1-2 x/day ,45 to 90 minutes PT Frequency: 5 out of 7 days PT Duration Estimated Length of Stay: 10-14days  OT Intensity: Minimum of 1-2 x/day, 45 to 90 minutes OT Frequency: 5 out of 7 days OT Duration/Estimated Length of Stay: 10-14 days SLP Intensity: Minumum of 1-2 x/day, 30 to 90 minutes SLP Frequency: 3 to 5 out of 7 days SLP Duration/Estimated Length of Stay: 10-14 days    Team Interventions: Nursing Interventions Patient/Family Education, Disease Management/Prevention, Discharge Planning, Skin Care/Wound Management, Medication Management  PT  interventions Balance/vestibular training, Ambulation/gait training, Community reintegration, Discharge planning, Disease management/prevention, Functional electrical stimulation, Cognitive remediation/compensation, DME/adaptive equipment instruction, Functional mobility training, Neuromuscular re-education, Pain management, Patient/family education, Splinting/orthotics, Stair training, Psychosocial support, Therapeutic Activities, Therapeutic Exercise, UE/LE Coordination activities, UE/LE Strength taining/ROM, Visual/perceptual remediation/compensation, Wheelchair propulsion/positioning  OT Interventions Warden/ranger, Discharge planning, Community reintegration, Fish farm manager, Functional mobility training, Neuromuscular re-education, Pain management, Psychosocial support, Patient/family education, Self Care/advanced ADL retraining, Therapeutic Activities, Therapeutic Exercise, UE/LE Strength taining/ROM, UE/LE Coordination activities  SLP Interventions Cognitive remediation/compensation, Cueing hierarchy, Functional tasks, Patient/family education, Therapeutic Activities, Environmental controls, Internal/external aids  TR Interventions    SW/CM Interventions Discharge Planning, Psychosocial Support, Patient/Family Education   Barriers to Discharge MD  Medical stability and Home enviroment access/loayout  Nursing      PT Inaccessible home environment, Decreased caregiver support, Home environment access/layout    OT      SLP      SW Decreased caregiver support Supervision level only per daughter   Team Discharge Planning: Destination: PT-Home ,OT- Home , SLP-Home Projected Follow-up: PT-Home health PT, OT-  Home health OT, SLP-None Projected Equipment Needs: PT-To be determined, OT- To be determined, SLP-None recommended by SLP Equipment Details: PT-has RW, SPC, and WC , OT-  Patient/family involved in discharge planning: PT- Patient,  OT-Patient,  SLP-Patient  MD ELOS: 10-14d Medical Rehab Prognosis:  Good Assessment:  83 year old right handed female with past medical history of hypertension, recurrent UTIs,hypothyroidism.left hip fracture with ORIF June 2016. Per chart review patient  lives with daughter independent with a caneon occasionprior to admission driving, shopping and doing her own finances. One level home with 3 steps to entry.She also has very good assistance from a neighbor.Presented 01/25/2017 with left-sided weakness and numbness. Blood pressure 209/96. Troponin negative. Cranial CT scan negative. Patient did not receive TPA. MRI showed acute right thalamic infarction. MRA with no large vessel occlusion. Carotid Doppler no ICA stenosis. Echocardiogram with ejection fraction 65% no wall motion abnormalities. Maintained on aspirin for CVA prophylaxis. Subcutaneous Lovenox for DVT prophylaxis. Elevated TSH 31.569 with free T4 and T3 pending.. Patient currently remains on Synthroid and plans to follow-up thyroid panel in 2weeks   Now requiring 24/7 Rehab RN,MD, as well as CIR level PT, OT and SLP.  Treatment team will focus on ADLs and mobility with goals set at modI/Sup See Team Conference Notes for weekly updates to the plan of care

## 2017-01-31 NOTE — Progress Notes (Signed)
Occupational Therapy Session Note  Patient Details  Name: Andrea [name] MRN: [id number] Date of Birth: [birth year]  Today's Date: 01/31/2017 OT Individual Time: 1405-1501 OT Individual Time Calculation (min): 56 min   Short Term Goals: Week 1:  OT Short Term Goal 1 (Week 1): Pt will complete 2/3 toileting tasks with steadying assist OT Short Term Goal 2 (Week 1): Pt will don socks/shoes with min A OT Short Term Goal 3 (Week 1): Pt will complete grooming task in standing with supervision  Skilled Therapeutic Interventions/Progress Updates:    Pt greeted semi-reclined in bed with nursing finishing working with pt. Pt transitioned to sitting EOB with min A. Pt pivoted to wc with min A and increased time to clear bottom.  Bathing/dressing at the sink with focus on fine motor coordination of L UE to open containers and grasp wash cloth. Sit<>stand with mod A, then pt able to reach behind and wash buttocks while OT provided min A for balance. Mod A for LB dressing sit<>stand 2/2 difficulty grasping clothing with L hand. Pt reported need for bathroom and completed stand-pivot to toilet with grab bars and min?mod A. Pt needed assistance for clothing management. Pt unable to have BM but voided bladder. Pt unable to safely completed hip hike, but stood for toileting with min A for balance. Pt left seated in wc at end of session with needs met.   Therapy Documentation Precautions:  Precautions Precautions: Fall Restrictions Weight Bearing Restrictions: No Pain: Pain Assessment Pain Assessment: No/denies pain  See Function Navigator for Current Functional Status.9  Therapy/Group: Individual Therapy  Valma Cava 01/31/2017, 2:30 PM

## 2017-01-31 NOTE — Progress Notes (Signed)
8:10 Patient stated "dull ache" under left breast radiating slightly to left shoulder. Andrea [name] called. EKG ordered and troponin levels ordered.. EKG NSR. Patient stated pain easing off, no nausea, no diaphoresis, no numbness, no tingling of left arm or any location. [name] [name]

## 2017-01-31 NOTE — Progress Notes (Signed)
Physical Therapy Session Note  Patient Details  Name: Andrea [name] MRN: [id number] Date of Birth: [birth year]  Today's Date: 01/31/2017 PT Individual Time: 6190-1222 PT Individual Time Calculation (min): 55 min   Short Term Goals: Week 1:  PT Short Term Goal 1 (Week 1): Pt will perform sit <> stands with min assist consistently PT Short Term Goal 2 (Week 1): Pt will be able to gait with min assist with LRAD x 150' PT Short Term Goal 3 (Week 1): Pt will be able to perform 4 steps with rails for functional strengthening with min assist  Skilled Therapeutic Interventions/Progress Updates:    no c/o pain.  Session focus on dynamic and static balance, gait, NMR, and activity tolerance.    Pt transfers sit<>stand throughout session with min/mod assist.  Stand/pivot with mod assist and verbal cues for upright posture during pivot.  Gait 2x55' with RW and min assist overall, min cues for upright posture and walker positioning.  Static/dynamic standing balance during LUE NMR/coordination/reaching tasks with min guard for balance and safety.  Nustep x10 minutes focus on reciprocal stepping pattern retraining, forced use, coordination, and activity tolerance.  Pt returned to room at end of session, call bell in reach and needs met.   Therapy Documentation Precautions:  Precautions Precautions: Fall Restrictions Weight Bearing Restrictions: No   See Function Navigator for Current Functional Status.   Therapy/Group: Individual Therapy  Michel Santee 01/31/2017, 4:27 PM

## 2017-01-31 NOTE — Progress Notes (Signed)
Subjective/Complaints:  No issues overnite  ROS- No CP, SOB, N/V/D  Objective: Vital Signs: Blood pressure (!) 137/58, pulse 65, temperature 98.5 F (36.9 C), temperature source Oral, resp. rate 18, height _0  (1.626 m), weight 80 kg (176 lb 4.8 oz), SpO2 96 %. No results found. Results for orders placed or performed during the hospital encounter of 01/28/17 (from the past 72 hour(s))  CBC     Status: Abnormal   Collection Time: 01/28/17  4:37 PM  Result Value Ref Range   WBC 9.6 4.0 - 10.5 K/uL   RBC 4.92 3.87 - 5.11 MIL/uL   Hemoglobin 15.9 (H) 12.0 - 15.0 g/dL   HCT 46.1 (H) 36.0 - 46.0 %   MCV 93.7 78.0 - 100.0 fL   MCH 32.3 26.0 - 34.0 pg   MCHC 34.5 30.0 - 36.0 g/dL   RDW 13.9 11.5 - 15.5 %   Platelets 377 150 - 400 K/uL  Creatinine, serum     Status: Abnormal   Collection Time: 01/28/17  4:37 PM  Result Value Ref Range   Creatinine, Ser 1.19 (H) 0.44 - 1.00 mg/dL   GFR calc non Af Amer 39 (L) >60 mL/min   GFR calc Af Amer 46 (L) >60 mL/min    Comment: (NOTE) The eGFR has been calculated using the CKD EPI equation. This calculation has not been validated in all clinical situations. eGFR's persistently <60 mL/min signify possible Chronic Kidney Disease.   CBC WITH DIFFERENTIAL     Status: Abnormal   Collection Time: 01/29/17  5:29 AM  Result Value Ref Range   WBC 8.4 4.0 - 10.5 K/uL   RBC 5.10 3.87 - 5.11 MIL/uL   Hemoglobin 16.3 (H) 12.0 - 15.0 g/dL   HCT 47.4 (H) 36.0 - 46.0 %   MCV 92.9 78.0 - 100.0 fL   MCH 32.0 26.0 - 34.0 pg   MCHC 34.4 30.0 - 36.0 g/dL   RDW 13.7 11.5 - 15.5 %   Platelets 380 150 - 400 K/uL   Neutrophils Relative % 63 %   Neutro Abs 5.3 1.7 - 7.7 K/uL   Lymphocytes Relative 28 %   Lymphs Abs 2.3 0.7 - 4.0 K/uL   Monocytes Relative 9 %   Monocytes Absolute 0.8 0.1 - 1.0 K/uL   Eosinophils Relative 0 %   Eosinophils Absolute 0.0 0.0 - 0.7 K/uL   Basophils Relative 0 %   Basophils Absolute 0.0 0.0 - 0.1 K/uL  Comprehensive  metabolic panel     Status: Abnormal   Collection Time: 01/29/17  5:29 AM  Result Value Ref Range   Sodium 137 135 - 145 mmol/L   Potassium 3.9 3.5 - 5.1 mmol/L   Chloride 102 101 - 111 mmol/L   CO2 23 22 - 32 mmol/L   Glucose, Bld 131 (H) 65 - 99 mg/dL   BUN 30 (H) 6 - 20 mg/dL   Creatinine, Ser 1.28 (H) 0.44 - 1.00 mg/dL   Calcium 9.8 8.9 - 10.3 mg/dL   Total Protein 7.2 6.5 - 8.1 g/dL   Albumin 3.9 3.5 - 5.0 g/dL   AST 46 (H) 15 - 41 U/L   ALT 25 14 - 54 U/L   Alkaline Phosphatase 92 38 - 126 U/L   Total Bilirubin 1.0 0.3 - 1.2 mg/dL   GFR calc non Af Amer 36 (L) >60 mL/min   GFR calc Af Amer 42 (L) >60 mL/min    Comment: (NOTE) The eGFR has been calculated using  the CKD EPI equation. This calculation has not been validated in all clinical situations. eGFR's persistently <60 mL/min signify possible Chronic Kidney Disease.    Anion gap 12 5 - 15     HEENT: normal Cardio: RRR and no murmur Resp: CTA B/L and unlabored GI: BS positive and NT, ND Extremity:  No Edema Skin:   Other Left lat leg blood filled bulla Neuro: Alert/Oriented, Abnormal Sensory redueced LT left foot, Abnormal Motor 4/5 Left delt, bi, tri, grip, HF, KE, ADF and Abnormal FMC Ataxic/ dec FMC Musc/Skel:  Bilateral knee Arthritic changes, bilateral hand PIP DIP arthritic changes Gen NAD   Assessment/Plan: 1. Functional deficits secondary to Right thalamic infarct which require 3+ hours per day of interdisciplinary therapy in a comprehensive inpatient rehab setting. Physiatrist is providing close team supervision and 24 hour management of active medical problems listed below. Physiatrist and rehab team continue to assess barriers to discharge/monitor patient progress toward functional and medical goals. FIM: Function - Bathing Position: Shower Body parts bathed by patient: Right arm, Right upper leg, Left arm, Left upper leg, Chest, Abdomen, Front perineal area, Buttocks, Right lower leg, Left lower  leg Body parts bathed by helper: Back Assist Level: Touching or steadying assistance(Pt > 75%)  Function- Upper Body Dressing/Undressing What is the patient wearing?: Pull over shirt/dress Pull over shirt/dress - Perfomed by patient: Thread/unthread right sleeve, Thread/unthread left sleeve, Put head through opening, Pull shirt over trunk Pull over shirt/dress - Perfomed by helper: Pull shirt over trunk Function - Lower Body Dressing/Undressing What is the patient wearing?: Underwear, Pants, Socks, Shoes Position: Wheelchair/chair at [employer] - Performed by patient: Thread/unthread right underwear leg, Thread/unthread left underwear leg, Pull underwear up/down Pants- Performed by patient: Thread/unthread right pants leg, Thread/unthread left pants leg, Pull pants up/down Socks - Performed by helper: Don/doff right sock, Don/doff left sock Shoes - Performed by patient: Don/doff right shoe, Don/doff left shoe Shoes - Performed by helper: Don/doff right shoe, Don/doff left shoe, Fasten right, Fasten left Assist for footwear: Maximal assist Assist for lower body dressing: Touching or steadying assistance (Pt > 75%)  Function - Toileting Toileting steps completed by helper: Adjust clothing prior to toileting, Performs perineal hygiene, Adjust clothing after toileting Toileting Assistive Devices: Grab bar or rail Assist level: Touching or steadying assistance (Pt.75%)  Function - Air cabin crew transfer assistive device: Bedside commode Assist level to toilet: Moderate assist (Pt 50 - 74%/lift or lower) Assist level from toilet: Moderate assist (Pt 50 - 74%/lift or lower) Assist level to bedside commode (at bedside): Moderate assist (Pt 50 - 74%/lift or lower) Assist level from bedside commode (at bedside): Moderate assist (Pt 50 - 74%/lift or lower)  Function - Chair/bed transfer Chair/bed transfer method: Ambulatory Chair/bed transfer assist level: Touching or steadying  assistance (Pt > 75%) Chair/bed transfer assistive device: Armrests, Walker Chair/bed transfer details: Manual facilitation for weight shifting, Manual facilitation for weight bearing  Function - Locomotion: Wheelchair Will patient use wheelchair at discharge?: No Type: Manual Max wheelchair distance: 85f  Assist Level: Moderate assistance (Pt 50 - 74%) Assist Level: Moderate assistance (Pt 50 - 74%) Wheel 150 feet activity did not occur: Safety/medical concerns Turns around,maneuvers to table,bed, and toilet,negotiates 3% grade,maneuvers on rugs and over doorsills: No Function - Locomotion: Ambulation Assistive device: Walker-rolling Max distance: 100' Assist level: Touching or steadying assistance (Pt > 75%) Assist level: Touching or steadying assistance (Pt > 75%) Assist level: Touching or steadying assistance (Pt > 75%) Walk 150 feet  activity did not occur: Safety/medical concerns Walk 10 feet on uneven surfaces activity did not occur: Safety/medical concerns  Function - Comprehension Comprehension: Auditory Comprehension assist level: Follows complex conversation/direction with extra time/assistive device  Function - Expression Expression: Verbal Expression assist level: Expresses complex ideas: With extra time/assistive device  Function - Social Interaction Social Interaction assist level: Interacts appropriately with others - No medications needed.  Function - Problem Solving Problem solving assist level: Solves complex problems: With extra time  Function - Memory Memory assist level: Recognizes or recalls 90% of the time/requires cueing < 10% of the time Patient normally able to recall (first 3 days only): Current season, Location of own room, Staff names and faces, That he or she is in a hospital  Medical Problem List and Plan: 1. Left hemiparesissecondary to right thalamic infarction -CIR PT, OT - no D/C date set yet 2. DVT  Prophylaxis/Anticoagulation: Subcutaneous Lovenox. Monitor for any bleeding episodes Plt 380 3. Pain Management: Tylenol as needed, Knee OA symptomatic during amb start voltaren gel 4. Mood: Provide emotional support 5. Neuropsych: This patient iscapable of making decisions on herown behalf. 6. Skin/Wound Care: Routine skin checks -local skin tear to abrasions 7. Fluids/Electrolytes/Nutrition: Routine I&O's repeat BMET 11/2 8.Hypertension. Maxide 37.5-25 mg daily.  Elevated BUN and creat will enc po change maxide to amlodipine Vitals:   01/30/17 1604 01/31/17 0500  BP: 137/61 (!) 137/58  Pulse: 68 65  Resp: 16 18  Temp: 98.9 F (37.2 C) 98.5 F (36.9 C)  SpO2: 96% 96%   9.Hyperlipidemia. Lipitor. 10.Hypothyroidism. Synthroid. TSH 31.569. Plans to follow-up thyroid panels in 2weeks  LOS (Days) 3 A FACE TO FACE EVALUATION WAS PERFORMED  Djon Tith E 01/31/2017, 7:02 AM

## 2017-01-31 NOTE — Progress Notes (Signed)
Patient denies chest pain or pressure or Nausea. Reported discomfort noted this am has subsided. Comfortable at present. Pamelia HoitSharp, Babs Dabbs B

## 2017-01-31 NOTE — Progress Notes (Signed)
Pt resting in bed quietly. Easily aroused. Denies any pain this shift. Able to make needs known. States that " I slept real good tonight." follows commands and safety is maintained. Uneventful night. Will continue to monitor.

## 2017-02-01 ENCOUNTER — Inpatient Hospital Stay (HOSPITAL_COMMUNITY): Payer: Medicare Other | Admitting: Speech Pathology

## 2017-02-01 ENCOUNTER — Inpatient Hospital Stay (HOSPITAL_COMMUNITY): Payer: Medicare Other | Admitting: Occupational Therapy

## 2017-02-01 ENCOUNTER — Inpatient Hospital Stay (HOSPITAL_COMMUNITY): Payer: Medicare Other | Admitting: Physical Therapy

## 2017-02-01 DIAGNOSIS — E038 Other specified hypothyroidism: Secondary | ICD-10-CM

## 2017-02-01 DIAGNOSIS — I10 Essential (primary) hypertension: Secondary | ICD-10-CM

## 2017-02-01 NOTE — Progress Notes (Signed)
Physical Therapy Session Note  Patient Details  Name: Andrea [name] MRN: [id number] Date of Birth: 15-Aug-1925  Today's Date: 02/01/2017 PT Individual Time: 1300-1400 AND 1630-1700  PT Individual Time Calculation (min): 60 min and 30 min  Short Term Goals: Week 1:  PT Short Term Goal 1 (Week 1): Pt will perform sit <> stands with min assist consistently PT Short Term Goal 2 (Week 1): Pt will be able to gait with min assist with LRAD x 150' PT Short Term Goal 3 (Week 1): Pt will be able to perform 4 steps with rails for functional strengthening with min assist  Skilled Therapeutic Interventions/Progress Updates:   Session 1 Pt received sitting in WC and agreeable to PT  Gait training with RW x 148f with min-supervision assist with cues for increased step height and heel strike on the L.    Dynamic gait training with figure 8 around 2 cones x 3 with RW. Min-supervision assist from PT with cues fo gait pattern and AD management.    Transfers completed with min-supervision assist from PT throughout treatment.   Dynamic balance training:  Reciprocal foot taps on 6 inch cone with min assist and BUE  Support.  Reaching to place 8 horse shoes on basketball goal x 2.  Dynavision: no UE Support and min-supervision assist form PT.  A x 3 20, 24, 27. With L UE only.  B with 50% Red and Green; Red with LUE , green with RUE 14/14.      Session 2.   Pt reports that she is tired and wants to stay in room.  Seated BLE NMR.  Hip adduction, hip abduction, LAQ, HS curls, calf raises, heel/shin slides, kicking to target.  All completed 2 x 10 with BLE and moderate cues for improved control and speed to improved Neuromotor control.  Pt left sitting in recliner with call bell in reach and all needs met.    Therapy Documentation Precautions:  Precautions Precautions: Fall Restrictions Weight Bearing Restrictions: No General:   Vital Signs:  Pain:   Mobility:   Locomotion :     Trunk/Postural Assessment :    Balance:   Exercises:   Other Treatments:     See Function Navigator for Current Functional Status.   Therapy/Group: Individual Therapy  ALorie Phenix11/05/2016, 2:05 PM

## 2017-02-01 NOTE — Progress Notes (Signed)
Andrea [name] is a 83 y.o. female 10/02/1925 [id number]  Subjective: Notes L>R ankle pain - knee [pain is better with Voltaren. No other concerns. Appreciates attention and pleasant nature of CIR staff  Objective: Vital signs in last 24 hours: Temp:  [97.8 F (36.6 C)-99.1 F (37.3 C)] 97.8 F (36.6 C) (11/03 0607) Pulse Rate:  [68-86] 86 (11/03 0607) Resp:  [14-18] 18 (11/03 0607) BP: (123-162)/(65-68) 162/68 (11/03 0607) SpO2:  [96 %-98 %] 96 % (11/03 0607) Weight change:  Last BM Date: 01/29/17  Intake/Output from previous day: 11/02 0701 - 11/03 0700 In: 600 [P.O.:600] Out: -   Physical Exam General: No apparent distress   Supine in bed, pleasant Lungs: Normal effort. Lungs clear to auscultation, no crackles or wheezes. Cardiovascular: Regular rate and rhythm, no edema Musculoskeletal:  Neurovascularly intact   Lab Results: BMET    Component Value Date/Time   NA 138 01/31/2017 0815   K 3.9 01/31/2017 0815   CL 105 01/31/2017 0815   CO2 23 01/31/2017 0815   GLUCOSE 121 (H) 01/31/2017 0815   BUN 29 (H) 01/31/2017 0815   CREATININE 1.19 (H) 01/31/2017 0815   CALCIUM 9.5 01/31/2017 0815   GFRNONAA 39 (L) 01/31/2017 0815   GFRAA 46 (L) 01/31/2017 0815   CBC    Component Value Date/Time   WBC 8.4 01/29/2017 0529   RBC 5.10 01/29/2017 0529   HGB 16.3 (H) 01/29/2017 0529   HCT 47.4 (H) 01/29/2017 0529   PLT 380 01/29/2017 0529   MCV 92.9 01/29/2017 0529   MCH 32.0 01/29/2017 0529   MCHC 34.4 01/29/2017 0529   RDW 13.7 01/29/2017 0529   LYMPHSABS 2.3 01/29/2017 0529   MONOABS 0.8 01/29/2017 0529   EOSABS 0.0 01/29/2017 0529   BASOSABS 0.0 01/29/2017 0529   CBG's (last 3):  No results for input(s): GLUCAP in the last 72 hours. LFT's Lab Results  Component Value Date   ALT 25 01/29/2017   AST 46 (H) 01/29/2017   ALKPHOS 92 01/29/2017   BILITOT 1.0 01/29/2017    Studies/Results: No results found.  Medications:  I have reviewed the patient's current  medications. Scheduled Medications: . amLODipine  2.5 mg Oral Daily  . aspirin EC  81 mg Oral Daily  . atorvastatin  20 mg Oral q1800  . diclofenac sodium  4 g Topical QID  . enoxaparin (LOVENOX) injection  40 mg Subcutaneous Q24H  . famotidine  20 mg Oral BID  . levothyroxine  125 mcg Oral QAC breakfast   PRN Medications: acetaminophen **OR** acetaminophen (TYLENOL) oral liquid 160 mg/5 mL **OR** acetaminophen, ondansetron **OR** ondansetron (ZOFRAN) IV, sorbitol  Assessment/Plan: Principal Problem:   Right thalamic infarction [facility](HCC) Active Problems:   Hypothyroidism   Hypertension   Thalamic stroke (HCC)   Length of stay, days: 4 1) Left hemiparesis secondary to right thalamic infarction.  Continue medical management of risk factors and ongoing inpatient rehab PT/OT 2) hypertension.  Continue current medication and blood pressure monitoring.  Increased trend since changing Maxide to amlodipine.  Consider additional titration as needed. 3) hyperlipidemia.  Continue statin 4.  Hypothyroidism continue Synthroid with follow-up thyroid labs 2 weeks post discharge 5) osteoarthritis, knee and ankles.  Continue Voltaren gel and symptomatic treatment as needed   Jeramy Dimmick A. Felicity CoyerLeschber, MD 02/01/2017, 12:20 PM

## 2017-02-01 NOTE — Progress Notes (Signed)
Occupational Therapy Session Note  Patient Details  Name: Andrea [name] MRN: [id number] Date of Birth: 06/02/1925  Today's Date: 02/01/2017 OT Individual Time:  - 810-910  Total individual minutes =60      Short Term Goals: Week 1:  OT Short Term Goal 1 (Week 1): Pt will complete 2/3 toileting tasks with steadying assist OT Short Term Goal 2 (Week 1): Pt will don socks/shoes with min A OT Short Term Goal 3 (Week 1): Pt will complete grooming task in standing with supervision  Skilled Therapeutic Interventions/Progress Updates: patient completed in room toileting, bathing & dressing with focus on L UE and LE use and bilateral integration for clothing mgmt and balance during toileting, pericleansing, and dressing.   Upon approach for therapy she stated her L LE hip, knee and ankle hurt "due to rheumatoid arthritiis..." and "I really worked so hard yesterday and need a break today."  Still, she was able to weightbear through the left LE joints with extra time to stabilize while pericleansing, washing and donning pants and panties, including sit to stand.      She completed all aspects of toileting with close supervision and extra time and was able to integrate her left hand into the activities and grasp and hold on functionally to wash cloth, clothing, etc  At end of session, she asked to ly down to rest and completed bed mobility with close S and extra time.  Her bell alarm was engaged at the end of the session as well.     Therapy Documentation Precautions:  Precautions Precautions: Fall Restrictions Weight Bearing Restrictions: No  Pain:denied    Therapy/Group: Individual Therapy  Bud Faceickett, Duy Lemming [facility] 02/01/2017, 8:43 AM

## 2017-02-02 ENCOUNTER — Inpatient Hospital Stay (HOSPITAL_COMMUNITY): Payer: Medicare Other | Admitting: Occupational Therapy

## 2017-02-02 ENCOUNTER — Other Ambulatory Visit: Payer: Self-pay

## 2017-02-02 NOTE — Progress Notes (Signed)
Andrea [name] is a 83 y.o. female 02/23/1926 [id number]  Subjective: No complaints. Feels optimistic. Denies pain. Slept well.  Objective: Vital signs in last 24 hours: Temp:  [98.1 F (36.7 C)-98.2 F (36.8 C)] 98.1 F (36.7 C) (11/04 0526) Pulse Rate:  [59-84] 59 (11/04 0526) Resp:  [18] 18 (11/04 0526) BP: (115-126)/(47-85) 115/47 (11/04 0526) SpO2:  [94 %-97 %] 97 % (11/04 0526) Weight change:  Last BM Date: 02/01/17  Intake/Output from previous day: 11/03 0701 - 11/04 0700 In: 360 [P.O.:360] Out: -   Physical Exam General: No apparent distress    Lungs: Normal effort. Lungs clear to auscultation, no crackles or wheezes. Cardiovascular: Regular rate and rhythm, no edema Neurological: mild-mod L HP, unchanged  Lab Results: BMET    Component Value Date/Time   NA 138 01/31/2017 0815   K 3.9 01/31/2017 0815   CL 105 01/31/2017 0815   CO2 23 01/31/2017 0815   GLUCOSE 121 (H) 01/31/2017 0815   BUN 29 (H) 01/31/2017 0815   CREATININE 1.19 (H) 01/31/2017 0815   CALCIUM 9.5 01/31/2017 0815   GFRNONAA 39 (L) 01/31/2017 0815   GFRAA 46 (L) 01/31/2017 0815   CBC    Component Value Date/Time   WBC 8.4 01/29/2017 0529   RBC 5.10 01/29/2017 0529   HGB 16.3 (H) 01/29/2017 0529   HCT 47.4 (H) 01/29/2017 0529   PLT 380 01/29/2017 0529   MCV 92.9 01/29/2017 0529   MCH 32.0 01/29/2017 0529   MCHC 34.4 01/29/2017 0529   RDW 13.7 01/29/2017 0529   LYMPHSABS 2.3 01/29/2017 0529   MONOABS 0.8 01/29/2017 0529   EOSABS 0.0 01/29/2017 0529   BASOSABS 0.0 01/29/2017 0529   CBG's (last 3):  No results for input(s): GLUCAP in the last 72 hours. LFT's Lab Results  Component Value Date   ALT 25 01/29/2017   AST 46 (H) 01/29/2017   ALKPHOS 92 01/29/2017   BILITOT 1.0 01/29/2017    Studies/Results: No results found.  Medications:  I have reviewed the patient's current medications. Scheduled Medications: . amLODipine  2.5 mg Oral Daily  . aspirin EC  81 mg Oral Daily   . atorvastatin  20 mg Oral q1800  . diclofenac sodium  4 g Topical QID  . enoxaparin (LOVENOX) injection  40 mg Subcutaneous Q24H  . famotidine  20 mg Oral BID  . levothyroxine  125 mcg Oral QAC breakfast   PRN Medications: acetaminophen **OR** acetaminophen (TYLENOL) oral liquid 160 mg/5 mL **OR** acetaminophen, ondansetron **OR** ondansetron (ZOFRAN) IV, sorbitol  Assessment/Plan: Principal Problem:   Right thalamic infarction [facility](HCC) Active Problems:   Hypothyroidism   Hypertension   Thalamic stroke (HCC)   Length of stay, days: 5  1) right thalamic infarction with left hemiparesis.  Continue medical management and ongoing rehab therapy. 2) hypertension - better control today.  No changes recommended.  We will continue current medication continue observation as ongoing 3) hyperlipidemia - continue statin 4) hypothyroid 5) osteoarthritis of the knee   Aurea Aronov A. Felicity CoyerLeschber, MD 02/02/2017, 9:21 AM

## 2017-02-02 NOTE — Progress Notes (Signed)
Occupational Therapy Session Note  Patient Details  Name: Andrea [name] MRN: [id number] Date of Birth: 08/31/1925  Today's Date: 02/02/2017 OT Individual Time: [id number] and [id number] OT Individual Time Calculation (min): 42 min and 30 min   Short Term Goals: Week 1:  OT Short Term Goal 1 (Week 1): Pt will complete 2/3 toileting tasks with steadying assist OT Short Term Goal 2 (Week 1): Pt will don socks/shoes with min A OT Short Term Goal 3 (Week 1): Pt will complete grooming task in standing with supervision  Skilled Therapeutic Interventions/Progress Updates:    1) Treatment session with focus on functional mobility and self-care retraining.  Pt ambulated to bathroom with RW, completed toileting and toilet transfer with min assist.  Pt opted to bathe from toilet seat this session.  Completed bathing with setup of items, pt able to open containers and wring out wash cloth.  Required assistance with pulling pants up over hips, with multimodal cues for positioning of LLE as it would slide out to side during standing due to decreased weight bearing/proprioception through LLE.  Pt required total assist for donning socks/shoes this session.  Left upright in recliner with all needs in reach.  2) Treatment session with focus on LUE NMR.  Engaged in fine motor control task with small push pin activity, pt demonstrating difficulty with picking up small push pins - improved success when therapist holding pin in hand to increase precision of grasp.  Pt with decreased proprioception, demonstrating decreased ability to grade pressure on push pin.  Modified activity to utilize checkers to increase success with larger pieces.  Pt continues to demonstrate difficulty when picking up larger pieces, however with improved success over push pins.  Engaged in stacking with 3 checkers and in-hand manipulation and translation with 2 checkers in hand.  Encouraged pt to compensate for decreased proprioception by visually  attending to task.    Therapy Documentation Precautions:  Precautions Precautions: Fall Restrictions Weight Bearing Restrictions: No Pain:  Pt with no c/o pain  See Function Navigator for Current Functional Status.   Therapy/Group: Individual Therapy  Rosalio LoudHOXIE, Zedrick Springsteen 02/02/2017, 12:28 PM

## 2017-02-02 NOTE — Plan of Care (Signed)
  [facility] See Patient Education module for education specifics   02/02/2017 2103 - Progressing by Sissy HoffBarber, Jonte Wollam M, RN   RH BOWEL ELIMINATION RH STG MANAGE BOWEL WITH ASSISTANCE Description STG Manage Bowel with  Mod I Assistance.  02/02/2017 2103 - Progressing by Sissy HoffBarber, Shailey Butterbaugh M, RN   RH BOWEL ELIMINATION RH STG MANAGE BOWEL W/MEDICATION W/ASSISTANCE Description STG Manage Bowel with Medication with  Mod I Assistance.  02/02/2017 2103 - Progressing by Sissy HoffBarber, Tavi Gaughran M, RN   RH SKIN INTEGRITY RH STG MAINTAIN SKIN INTEGRITY WITH ASSISTANCE Description STG Maintain Skin Integrity With Min Assistance.  02/02/2017 2103 - Progressing by Sissy HoffBarber, Lauri Purdum M, RN   RH SAFETY RH STG ADHERE TO SAFETY PRECAUTIONS W/ASSISTANCE/DEVICE Description STG Adhere to Safety Precautions With supervision/cues Mod I  Assistance/Device.   02/02/2017 2103 - Progressing by Sissy HoffBarber, Greer Koeppen M, RN

## 2017-02-03 ENCOUNTER — Inpatient Hospital Stay (HOSPITAL_COMMUNITY): Payer: Medicare Other | Admitting: Physical Therapy

## 2017-02-03 ENCOUNTER — Inpatient Hospital Stay (HOSPITAL_COMMUNITY): Payer: Medicare Other | Admitting: Occupational Therapy

## 2017-02-03 NOTE — Progress Notes (Signed)
Patient slept quietly throughout the night.  Ambulated to bathroom x1.  Continent of bladder this shift. Last BM on 11/3.  Calls appropriately.    Kelli HopeBarber, Stanislav Gervase M

## 2017-02-03 NOTE — Progress Notes (Signed)
Physical Therapy Session Note  Patient Details  Name: Andrea [name] MRN: [id number] Date of Birth: 01-Dec-1925  Today's Date: 02/03/2017 PT Individual Time: 1100-1200 PT Individual Time Calculation (min): 60 min   Short Term Goals: Week 1:  PT Short Term Goal 1 (Week 1): Pt will perform sit <> stands with min assist consistently PT Short Term Goal 2 (Week 1): Pt will be able to gait with min assist with LRAD x 150' PT Short Term Goal 3 (Week 1): Pt will be able to perform 4 steps with rails for functional strengthening with min assist  Skilled Therapeutic Interventions/Progress Updates:    no c/o pain.  Session focus on functional mobility for toileting, transfers, ambulation, and LUE fine motor coordination.    Pt transfers throughout session with supervision, occasionally requires more than one try to achieve forward weight shift, and RW.  Ambulation throughout room with supervision.  Toileting with supervision for balance.  Ambulation throughout unit with distant supervision. High level ambulation weaving through cones with supervision for safety.  LUE NMR for fine motor coordination with graded clothespins and pincher grasp with varying objects.  Pt returned to room at end of session and positioned with call bell in reach and needs met.  Therapy Documentation Precautions:  Precautions Precautions: Fall Restrictions Weight Bearing Restrictions: No   See Function Navigator for Current Functional Status.   Therapy/Group: Individual Therapy  Michel Santee 02/03/2017, 12:08 PM

## 2017-02-03 NOTE — Progress Notes (Signed)
Occupational Therapy Session Note  Patient Details  Name: Andrea [name] MRN: [id number] Date of Birth: 05/21/1925  Today's Date: 02/03/2017 OT Individual Time: 1000-1100 OT Individual Time Calculation (min): 60 min    Short Term Goals: Week 1:  OT Short Term Goal 1 (Week 1): Pt will complete 2/3 toileting tasks with steadying assist OT Short Term Goal 2 (Week 1): Pt will don socks/shoes with min A OT Short Term Goal 3 (Week 1): Pt will complete grooming task in standing with supervision  Skilled Therapeutic Interventions/Progress Updates:    Upon entering the room, pt supine in bed with no c/o pain and agreeable to OT intervention. Skilled OT intervention with focus on L UE NMR, balance, functional transfers/mobility, and self care retraining. Pt performed bed mobility with supervision to EOB. Pt ambulating with RW and close supervision 10' to sink for bathing and dressing tasks. Pt required min A for standing balance to wash buttocks and peri area this session. Pt requiring increased encouragement for use of L UE with functional tasks. Pt returning to sit on EOB for L UE finger isolation exercises. Pt with difficulty controlling movement for digits 3-5. Pt remained sitting on EOB with call bell and all needed items within reach.  Therapy Documentation Precautions:  Precautions Precautions: Fall Restrictions Weight Bearing Restrictions: No  See Function Navigator for Current Functional Status.   Therapy/Group: Individual Therapy  Alen BleacherBradsher, [name] P 02/03/2017, 12:28 PM

## 2017-02-03 NOTE — Progress Notes (Signed)
Occupational Therapy Session Note  Patient Details  Name: Andrea [name] MRN: [id number] Date of Birth: 12-28-1925  Today's Date: 02/03/2017 OT Individual Time: 4370-0525 OT Individual Time Calculation (min): 73 min   Short Term Goals: Week 1:  OT Short Term Goal 1 (Week 1): Pt will complete 2/3 toileting tasks with steadying assist OT Short Term Goal 2 (Week 1): Pt will don socks/shoes with min A OT Short Term Goal 3 (Week 1): Pt will complete grooming task in standing with supervision  Skilled Therapeutic Interventions/Progress Updates:    OT treatment session focused on functional transfers, functional mobility, and L hand NMR. Pt ambulated to therapy apartment with RW and supervision. Practiced tub bench transfer in simulated home environment with supervision. Pt reports she already has tub bench set-up at home from previous hip surgeries. Pt then practiced bed transfers with supervision.Visualspatial and problem solving skills with large easy level puzzle.  L hand strength/coordination with yellow thera-putty activity. Focus on in-hand manipulation, pinch strength, and graded hand strength. Provided pt with home fine motor program and thera-putty exercise handouts. Graded beading activity with different size and shaped beads. Pt returned to room and left semi-reclined in bed with needs met and call bell in reach.   Therapy Documentation Precautions:  Precautions Precautions: Fall Restrictions Weight Bearing Restrictions: No Pain:  none/denies pain   See Function Navigator for Current Functional Status.   Therapy/Group: Individual Therapy  Valma Cava 02/03/2017, 1:46 PM

## 2017-02-03 NOTE — Progress Notes (Signed)
Subjective/Complaints:  No issues overnite  ROS- No CP, SOB, N/V/D  Objective: Vital Signs: Blood pressure (!) 136/56, pulse 61, temperature 98.1 F (36.7 C), temperature source Oral, resp. rate 18, height 5' 4"  (1.626 m), weight 80 kg (176 lb 4.8 oz), SpO2 97 %. No results found. Results for orders placed or performed during the hospital encounter of 01/28/17 (from the past 72 hour(s))  Basic metabolic panel     Status: Abnormal   Collection Time: 01/31/17  8:15 AM  Result Value Ref Range   Sodium 138 135 - 145 mmol/L   Potassium 3.9 3.5 - 5.1 mmol/L   Chloride 105 101 - 111 mmol/L   CO2 23 22 - 32 mmol/L   Glucose, Bld 121 (H) 65 - 99 mg/dL   BUN 29 (H) 6 - 20 mg/dL   Creatinine, Ser 1.19 (H) 0.44 - 1.00 mg/dL   Calcium 9.5 8.9 - 10.3 mg/dL   GFR calc non Af Amer 39 (L) >60 mL/min   GFR calc Af Amer 46 (L) >60 mL/min    Comment: (NOTE) The eGFR has been calculated using the CKD EPI equation. This calculation has not been validated in all clinical situations. eGFR's persistently <60 mL/min signify possible Chronic Kidney Disease.    Anion gap 10 5 - 15  Troponin I     Status: None   Collection Time: 01/31/17  8:15 AM  Result Value Ref Range   Troponin I <0.03 <0.03 ng/mL  Troponin I     Status: None   Collection Time: 01/31/17  2:02 PM  Result Value Ref Range   Troponin I <0.03 <0.03 ng/mL  Troponin I     Status: None   Collection Time: 01/31/17  8:26 PM  Result Value Ref Range   Troponin I <0.03 <0.03 ng/mL     HEENT: normal Cardio: RRR and no murmur Resp: CTA B/L and unlabored GI: BS positive and NT, ND Extremity:  No Edema Skin:   Other Left lat leg blood filled bulla Neuro: Alert/Oriented, Abnormal Sensory redueced LT left foot, Abnormal Motor 4/5 Left delt, bi, tri, grip, HF, KE, ADF and Abnormal FMC Ataxic/ dec FMC Musc/Skel:  Bilateral knee Arthritic changes, bilateral hand PIP DIP arthritic changes Gen NAD   Assessment/Plan: 1. Functional deficits  secondary to Right thalamic infarct which require 3+ hours per day of interdisciplinary therapy in a comprehensive inpatient rehab setting. Physiatrist is providing close team supervision and 24 hour management of active medical problems listed below. Physiatrist and rehab team continue to assess barriers to discharge/monitor patient progress toward functional and medical goals. FIM: Function - Bathing Position: (seated on toilet) Body parts bathed by patient: Right arm, Right upper leg, Left arm, Left upper leg, Chest, Abdomen, Front perineal area, Buttocks, Right lower leg, Left lower leg Body parts bathed by helper: Back Assist Level: Touching or steadying assistance(Pt > 75%)  Function- Upper Body Dressing/Undressing What is the patient wearing?: Pull over shirt/dress Pull over shirt/dress - Perfomed by patient: Thread/unthread right sleeve, Thread/unthread left sleeve, Put head through opening, Pull shirt over trunk Pull over shirt/dress - Perfomed by helper: Pull shirt over trunk Assist Level: Set up Set up : To obtain clothing/put away Function - Lower Body Dressing/Undressing What is the patient wearing?: Underwear, Pants, Socks, Shoes Position: (recliner) Underwear - Performed by patient: Thread/unthread right underwear leg, Thread/unthread left underwear leg, Pull underwear up/down Underwear - Performed by helper: Pull underwear up/down, Thread/unthread right underwear leg Pants- Performed by patient: Thread/unthread left  pants leg, Thread/unthread right pants leg Pants- Performed by helper: Pull pants up/down Socks - Performed by helper: Don/doff right sock, Don/doff left sock Shoes - Performed by patient: Don/doff right shoe, Don/doff left shoe Shoes - Performed by helper: Don/doff right shoe, Don/doff left shoe, Fasten right, Fasten left Assist for footwear: Maximal assist Assist for lower body dressing: (Max assist)  Function - Toileting Toileting steps completed by  patient: Adjust clothing prior to toileting, Performs perineal hygiene Toileting steps completed by helper: Adjust clothing after toileting Toileting Assistive Devices: Grab bar or rail Assist level: Touching or steadying assistance (Pt.75%)  Function - Air cabin crew transfer assistive device: Grab bar, Walker Assist level to toilet: Touching or steadying assistance (Pt > 75%) Assist level from toilet: Touching or steadying assistance (Pt > 75%) Assist level to bedside commode (at bedside): Moderate assist (Pt 50 - 74%/lift or lower) Assist level from bedside commode (at bedside): Moderate assist (Pt 50 - 74%/lift or lower)  Function - Chair/bed transfer Chair/bed transfer method: Stand pivot Chair/bed transfer assist level: Moderate assist (Pt 50 - 74%/lift or lower) Chair/bed transfer assistive device: Bedrails Chair/bed transfer details: Manual facilitation for weight shifting, Manual facilitation for weight bearing  Function - Locomotion: Wheelchair Will patient use wheelchair at discharge?: No Type: Manual Max wheelchair distance: 66f  Assist Level: Moderate assistance (Pt 50 - 74%) Assist Level: Moderate assistance (Pt 50 - 74%) Wheel 150 feet activity did not occur: Safety/medical concerns Turns around,maneuvers to table,bed, and toilet,negotiates 3% grade,maneuvers on rugs and over doorsills: No Function - Locomotion: Ambulation Assistive device: Walker-rolling Max distance: 100' Assist level: Touching or steadying assistance (Pt > 75%) Assist level: Touching or steadying assistance (Pt > 75%) Assist level: Touching or steadying assistance (Pt > 75%) Walk 150 feet activity did not occur: Safety/medical concerns Walk 10 feet on uneven surfaces activity did not occur: Safety/medical concerns  Function - Comprehension Comprehension: Auditory Comprehension assist level: Follows complex conversation/direction with extra time/assistive device  Function -  Expression Expression: Verbal Expression assist level: Expresses complex ideas: With extra time/assistive device  Function - Social Interaction Social Interaction assist level: Interacts appropriately with others with medication or extra time (anti-anxiety, antidepressant).  Function - Problem Solving Problem solving assist level: Solves complex problems: With extra time  Function - Memory Memory assist level: More than reasonable amount of time Patient normally able to recall (first 3 days only): Current season, Location of own room, Staff names and faces, That he or she is in a hospital  Medical Problem List and Plan: 1. Left hemiparesissecondary to right thalamic infarction -CIR PT, OT - tolerating therapy 2. DVT Prophylaxis/Anticoagulation: Subcutaneous Lovenox. Monitor for any bleeding episodes Plt 380 3. Pain Management: Tylenol as needed, Knee OA improved on voltaren gel 4. Mood: Provide emotional support 5. Neuropsych: This patient iscapable of making decisions on herown behalf. 6. Skin/Wound Care: Routine skin checks -local skin tear to abrasions 7. Fluids/Electrolytes/Nutrition: Routine I&O's repeat BMET 11/2-improving 8.Hypertension. Maxide 37.5-25 mg daily.  BP OK 11/5 Vitals:   02/02/17 1400 02/03/17 0602  BP: 120/82 (!) 136/56  Pulse: 70 61  Resp: 18 18  Temp: 98.1 F (36.7 C) 98.1 F (36.7 C)  SpO2: 96% 97%   9.Hyperlipidemia. Lipitor. 10.Hypothyroidism. Synthroid. TSH 31.569. Plans to follow-up thyroid panels in 2weeks  LOS (Days) 6 A FACE TO FACE EVALUATION WAS PERFORMED  Keishla Oyer E 02/03/2017, 7:05 AM

## 2017-02-04 ENCOUNTER — Inpatient Hospital Stay (HOSPITAL_COMMUNITY): Payer: Medicare Other | Admitting: Occupational Therapy

## 2017-02-04 ENCOUNTER — Inpatient Hospital Stay (HOSPITAL_COMMUNITY): Payer: Medicare Other

## 2017-02-04 LAB — CREATININE, SERUM
Creatinine, Ser: 1.09 mg/dL — ABNORMAL HIGH (ref 0.44–1.00)
GFR, EST AFRICAN AMERICAN: 51 mL/min — AB (ref >60)
GFR, EST NON AFRICAN AMERICAN: 44 mL/min — AB (ref >60)

## 2017-02-04 NOTE — Progress Notes (Signed)
Physical Therapy Session Note  Patient Details  Name: Andrea [name] MRN: [id number] Date of Birth: 09/19/1925  Today's Date: 02/04/2017 PT Individual Time: 1515-1600 PT Individual Time Calculation (min): 45 min   Short Term Goals: Week 1:  PT Short Term Goal 1 (Week 1): Pt will perform sit <> stands with min assist consistently PT Short Term Goal 2 (Week 1): Pt will be able to gait with min assist with LRAD x 150' PT Short Term Goal 3 (Week 1): Pt will be able to perform 4 steps with rails for functional strengthening with min assist  Skilled Therapeutic Interventions/Progress Updates:    Patient ambulated with RW 200' with S to minguard assist occasional cues for L foot clearance.  Ambulated with SPC 75' and 60' with min A.  Negotiated 4 steps with rails and min A mod cues for technique and increased time.  Ambulated on ramp with RW and S and performed car transfers with S and increased time (3 attempts), cues for technique.  In parallel bars performed balance activity tossing ball to self x 10 reps 3 sets with S. Pt left in recliner all needs in reach end of session.   Therapy Documentation Precautions:  Precautions Precautions: Fall Restrictions Weight Bearing Restrictions: No Pain: Pain Assessment Pain Assessment: No/denies pain   See Function Navigator for Current Functional Status.   Therapy/Group: Individual Therapy  [name]  Lyndonyndi Wynn, [place]PT [phone] 02/04/2017  02/04/2017, 5:00 PM

## 2017-02-04 NOTE — Progress Notes (Addendum)
Subjective/Complaints:  No issues overnite, slept well, has hand pain with hx OA, Volatraen gel helping with foot pain  ROS- No CP, SOB, N/V/D  Objective: Vital Signs: Blood pressure 110/61, pulse 66, temperature 97.8 F (36.6 C), temperature source Oral, resp. rate 12, height 5' 4"  (1.626 m), weight 80 kg (176 lb 4.8 oz), SpO2 100 %. No results found. Results for orders placed or performed during the hospital encounter of 01/28/17 (from the past 72 hour(s))  Creatinine, serum     Status: Abnormal   Collection Time: 02/04/17  4:41 AM  Result Value Ref Range   Creatinine, Ser 1.09 (H) 0.44 - 1.00 mg/dL   GFR calc non Af Amer 44 (L) >60 mL/min   GFR calc Af Amer 51 (L) >60 mL/min    Comment: (NOTE) The eGFR has been calculated using the CKD EPI equation. This calculation has not been validated in all clinical situations. eGFR's persistently <60 mL/min signify possible Chronic Kidney Disease.      HEENT: normal Cardio: RRR and no murmur Resp: CTA B/L and unlabored GI: BS positive and NT, ND Extremity:  No Edema Skin:   Other Left lat leg blood filled bulla Neuro: Alert/Oriented, Abnormal Sensory redueced LT left foot, Abnormal Motor 4/5 Left delt, bi, tri, grip, HF, KE, ADF and Abnormal FMC Ataxic/ dec FMC Musc/Skel:  Bilateral knee Arthritic changes, bilateral hand PIP DIP arthritic changes Gen NAD   Assessment/Plan: 1. Functional deficits secondary to Right thalamic infarct which require 3+ hours per day of interdisciplinary therapy in a comprehensive inpatient rehab setting. Physiatrist is providing close team supervision and 24 hour management of active medical problems listed below. Physiatrist and rehab team continue to assess barriers to discharge/monitor patient progress toward functional and medical goals. FIM: Function - Bathing Position: Wheelchair/chair at sink Body parts bathed by patient: Right arm, Right upper leg, Left arm, Left upper leg, Chest, Abdomen,  Front perineal area, Buttocks, Right lower leg, Left lower leg Body parts bathed by helper: Back Assist Level: Touching or steadying assistance(Pt > 75%)  Function- Upper Body Dressing/Undressing What is the patient wearing?: Pull over shirt/dress Pull over shirt/dress - Perfomed by patient: Thread/unthread right sleeve, Thread/unthread left sleeve, Put head through opening, Pull shirt over trunk Pull over shirt/dress - Perfomed by helper: Pull shirt over trunk Assist Level: Set up, Supervision or verbal cues Set up : To obtain clothing/put away Function - Lower Body Dressing/Undressing What is the patient wearing?: Underwear, Pants, Non-skid slipper socks Position: Wheelchair/chair at sink Underwear - Performed by patient: Thread/unthread right underwear leg, Thread/unthread left underwear leg, Pull underwear up/down Underwear - Performed by helper: Pull underwear up/down, Thread/unthread right underwear leg Pants- Performed by patient: Thread/unthread left pants leg, Thread/unthread right pants leg, Pull pants up/down Pants- Performed by helper: Fasten/unfasten pants Non-skid slipper socks- Performed by patient: Don/doff right sock, Don/doff left sock Socks - Performed by helper: Don/doff right sock, Don/doff left sock Shoes - Performed by patient: Don/doff right shoe, Don/doff left shoe Shoes - Performed by helper: Don/doff right shoe, Don/doff left shoe, Fasten right, Fasten left Assist for footwear: Supervision/touching assist Assist for lower body dressing: Touching or steadying assistance (Pt > 75%)  Function - Toileting Toileting steps completed by patient: Adjust clothing prior to toileting, Performs perineal hygiene, Adjust clothing after toileting Toileting steps completed by helper: Adjust clothing after toileting Toileting Assistive Devices: Grab bar or rail Assist level: Supervision or verbal cues  Function - Air cabin crew transfer assistive device:  Environmental consultant  Assist level to toilet: Touching or steadying assistance (Pt > 75%) Assist level from toilet: Supervision or verbal cues Assist level to bedside commode (at bedside): Moderate assist (Pt 50 - 74%/lift or lower) Assist level from bedside commode (at bedside): Moderate assist (Pt 50 - 74%/lift or lower)  Function - Chair/bed transfer Chair/bed transfer method: Stand pivot, Ambulatory Chair/bed transfer assist level: Supervision or verbal cues Chair/bed transfer assistive device: Bedrails Chair/bed transfer details: Manual facilitation for weight shifting, Manual facilitation for weight bearing  Function - Locomotion: Wheelchair Will patient use wheelchair at discharge?: No Type: Manual Max wheelchair distance: 39f  Assist Level: Moderate assistance (Pt 50 - 74%) Assist Level: Moderate assistance (Pt 50 - 74%) Wheel 150 feet activity did not occur: Safety/medical concerns Turns around,maneuvers to table,bed, and toilet,negotiates 3% grade,maneuvers on rugs and over doorsills: No Function - Locomotion: Ambulation Assistive device: Walker-rolling Max distance: 100' Assist level: Supervision or verbal cues Assist level: Supervision or verbal cues Assist level: Supervision or verbal cues Walk 150 feet activity did not occur: Safety/medical concerns Assist level: Supervision or verbal cues Walk 10 feet on uneven surfaces activity did not occur: Safety/medical concerns  Function - Comprehension Comprehension: Auditory Comprehension assist level: Follows complex conversation/direction with extra time/assistive device  Function - Expression Expression: Verbal Expression assist level: Expresses complex ideas: With extra time/assistive device  Function - Social Interaction Social Interaction assist level: Interacts appropriately with others with medication or extra time (anti-anxiety, antidepressant).  Function - Problem Solving Problem solving assist level: Solves complex  problems: With extra time  Function - Memory Memory assist level: Recognizes or recalls 90% of the time/requires cueing < 10% of the time Patient normally able to recall (first 3 days only): Current season, Location of own room, Staff names and faces, That he or she is in a hospital  Medical Problem List and Plan: 1. Left hemiparesissecondary to right thalamic infarction -CIR PT, OT - tolerating therapy, team conf in am 2. DVT Prophylaxis/Anticoagulation: Subcutaneous Lovenox. Monitor for any bleeding episodes Plt 380 3. Pain Management: Tylenol as needed, Knee OA improved on voltaren gel, may use for hands as well    4. Mood: Provide emotional support 5. Neuropsych: This patient iscapable of making decisions on herown behalf. 6. Skin/Wound Care: Routine skin checks -local skin tear to abrasions 7. Fluids/Electrolytes/Nutrition: Routine I&O's Creat nl 11/6 8.Hypertension. Maxide 37.5-25 mg daily at home , now on amlodipine 2.5 mg daily BP OK 11/5 Vitals:   02/03/17 1400 02/04/17 0345  BP: (!) 147/63 110/61  Pulse: 69 66  Resp: 18 12  Temp: 97.8 F (36.6 C) 97.8 F (36.6 C)  SpO2: 98% 100%   9.Hyperlipidemia. Lipitor. 10.Hypothyroidism. Synthroid. TSH 31.569. Plans to follow-up thyroid panels in 2weeks  LOS (Days) 7 A FACE TO FACE EVALUATION WAS PERFORMED  Abelina Ketron E 02/04/2017, 6:43 AM

## 2017-02-04 NOTE — Progress Notes (Signed)
Occupational Therapy Session Note  Patient Details  Name: Andrea [name] MRN: [id number] Date of Birth: [birth year]  Today's Date: 02/04/2017  Session 1 OT Individual Time: 0165-8006 OT Individual Time Calculation (min): 71 min   Session 2 OT Individual Time: 1310-1330 OT Individual Time Calculation (min): 20 min   Short Term Goals: Week 1:  OT Short Term Goal 1 (Week 1): Pt will complete 2/3 toileting tasks with steadying assist OT Short Term Goal 2 (Week 1): Pt will don socks/shoes with min A OT Short Term Goal 3 (Week 1): Pt will complete grooming task in standing with supervision  Skilled Therapeutic Interventions/Progress Updates:   Session 1   OT treatment session focused on simple meal pep, activity tolerance, and home safety modifications.  Pt ambulated to therapy apartment supervision w/ Rw. Pt educated on proper walker placement during meal prep and how to utilize counter tops to push cooking objects when using RW. Pt-therapist collaboration completed regarding kitchen modifications at home during completion of simple meal prep. Pt able to access first section of upper cabinets and  Demonstrated good understanding of following baking instructions and reading measurements. Pt only needed assist with opening plastic bag on muffin mix and opening apple sauce containers. Incorporated functional use of L hand with stirring and washing dishes. Pt tolerated ~30 minutes in standing throughout simple meal prep without rest  Break. Pt returned to room at end of session and left seated EOB with needs met and call bell in reach.   Session 2 OT treatment session focused on L fine motor coordination. Utilized thera-putty to work on Warehouse manager, in-hand manipulation [id number] and rotation. Addressed pinch strength with picking up balls of putty, pinching, then releasing into cup. Pt left seated at EOB at end of session with needs met.   Therapy Documentation Precautions:   Precautions Precautions: Fall Restrictions Weight Bearing Restrictions: No Pain: Pain Assessment Pain Assessment: No/denies pain  See Function Navigator for Current Functional Status.   Therapy/Group: Individual Therapy  Valma Cava 02/04/2017, 1:31 PM

## 2017-02-04 NOTE — Progress Notes (Signed)
Called to room pt reported lips numb and "tight" and left UE is weak and feels different.  Mouth full of mush. No chest pain, or nausea. No other symptoms noted. Scarey situation since the stroke. Vitals taken and situation [facility], no new orders received. Pamelia HoitSharp, Arletha Marschke B

## 2017-02-04 NOTE — Progress Notes (Signed)
No issues with lips or left UE as described previously. Reports doing well at present. Just better to be safe than sorry. Never had this before the stroke and she is worried. Tolerating therapy session without problems. Pamelia HoitSharp, Tashaun Obey B

## 2017-02-04 NOTE — Progress Notes (Signed)
Physical Therapy Session Note  Patient Details  Name: Andrea [name] MRN: [id number] Date of Birth: 02/14/1926  Today's Date: 02/04/2017 PT Individual Time: 0930-1030 PT Individual Time Calculation (min): 60 min   Short Term Goals: Week 1:  PT Short Term Goal 1 (Week 1): Pt will perform sit <> stands with min assist consistently PT Short Term Goal 2 (Week 1): Pt will be able to gait with min assist with LRAD x 150' PT Short Term Goal 3 (Week 1): Pt will be able to perform 4 steps with rails for functional strengthening with min assist  Skilled Therapeutic Interventions/Progress Updates:    Patient received in bed and requesting to get dressed prior to leaving for therapy.  Performed bed mobility S with railing, able to transfer sit<>stand min A and cues for hand placement, L LE positioning.  Ambulated short distances in room min A with one LOB reaching down for clothing in lower drawer and mod A to recover due to L foot "sticking".  Patient performed toileting in bathroom mod A for balance during pericare with L foot sliding out and pt unaware.  Performed upper body dressing S and lower body dressing with mod A.  Patient ambulated to therapy gym 120' min guard with RW cues for posture, facilitation for trunk and hip extension and L foot clearance catching foot about 30% of the time.  Patient performed dynamic standing activities with cane for support and min A tapping 6" step to targets with L vs R foot for coordination/balance and stance stability of both legs.  Patient ambulated through floor ladder with cane and minA to work on foot clearance during gait.  Step to pattern leading with L.  Patient ambulated back to room with walker minguard and cues as previous and left in recliner with all needs in reach.   Therapy Documentation Precautions:  Precautions Precautions: Fall Restrictions Weight Bearing Restrictions: No Vital Signs: Pain: Pain Assessment Pain Assessment: No/denies pain   See  Function Navigator for Current Functional Status.   Therapy/Group: Individual Therapy  [name]  Arcolayndi Wynn, [place] CarolinaPT [phone] 02/04/2017  02/04/2017, 10:30 AM

## 2017-02-05 ENCOUNTER — Inpatient Hospital Stay (HOSPITAL_COMMUNITY): Payer: Medicare Other | Admitting: Occupational Therapy

## 2017-02-05 ENCOUNTER — Inpatient Hospital Stay (HOSPITAL_COMMUNITY): Payer: Medicare Other

## 2017-02-05 ENCOUNTER — Inpatient Hospital Stay (HOSPITAL_COMMUNITY): Payer: Medicare Other | Admitting: Physical Therapy

## 2017-02-05 MED ORDER — POLYETHYLENE GLYCOL 3350 17 G PO PACK
17.0000 g | PACK | Freq: Every day | ORAL | Status: DC
  Filled 2017-02-05: qty 1

## 2017-02-05 MED ORDER — POLYETHYLENE GLYCOL 3350 17 G PO PACK
17.0000 g | PACK | Freq: Two times a day (BID) | ORAL | Status: DC
  Administered 2017-02-05: 17 g via ORAL
  Filled 2017-02-05 (×3): qty 1

## 2017-02-05 MED ORDER — FLEET ENEMA 7-19 GM/118ML RE ENEM
1.0000 | ENEMA | Freq: Every day | RECTAL | Status: DC | PRN
  Administered 2017-02-06: 1 via RECTAL
  Filled 2017-02-05: qty 1

## 2017-02-05 NOTE — Plan of Care (Signed)
Pt having constipation. MD notified and miralax added

## 2017-02-05 NOTE — Plan of Care (Signed)
  Progressing Consults RH STROKE PATIENT EDUCATION Description See Patient Education module for education specifics   02/05/2017 1744 - Progressing by Alfonso RamusEvans, Lonald Troiani S, RN RH BOWEL ELIMINATION RH STG MANAGE BOWEL WITH ASSISTANCE Description STG Manage Bowel with  Mod I Assistance.  02/05/2017 1744 - Progressing by Alfonso RamusEvans, Eden Toohey S, RN 02/05/2017 1609 - Not Progressing by Alfonso RamusEvans, Jesicca Dipierro S, RN Flowsheets Taken 02/05/2017 1609  STG: Pt will manage bowels with assistance 6-Modified independent Note No BM since 02/01/17  RH STG MANAGE BOWEL W/MEDICATION W/ASSISTANCE Description STG Manage Bowel with Medication with  Mod I Assistance.  02/05/2017 1744 - Progressing by Alfonso RamusEvans, Montel Vanderhoof S, RN 02/05/2017 1609 - Progressing by Alfonso RamusEvans, Marisella Puccio S, RN RH SKIN INTEGRITY RH STG MAINTAIN SKIN INTEGRITY WITH ASSISTANCE Description STG Maintain Skin Integrity With Min Assistance.  02/05/2017 1744 - Progressing by Alfonso RamusEvans, Conception Doebler S, RN 02/05/2017 1609 - Progressing by Alfonso RamusEvans, Keerat Denicola S, RN RH SAFETY RH STG ADHERE TO SAFETY PRECAUTIONS W/ASSISTANCE/DEVICE Description STG Adhere to Safety Precautions With supervision/cues Mod I  Assistance/Device.   02/05/2017 1744 - Progressing by Alfonso RamusEvans, Maury Bamba S, RN 02/05/2017 1609 - Progressing by Alfonso RamusEvans, Nakeisha Greenhouse S, RN RH KNOWLEDGE DEFICIT RH STG INCREASE KNOWLEDGE OF DIABETES 02/05/2017 1744 - Progressing by Alfonso RamusEvans, Zamier Eggebrecht S, RN 02/05/2017 1609 - Progressing by Alfonso RamusEvans, Chrsitopher Wik S, RN RH STG INCREASE KNOWLEDGE OF HYPERTENSION 02/05/2017 1609 - Progressing by Alfonso RamusEvans, Wilburta Milbourn S, RN

## 2017-02-05 NOTE — Progress Notes (Signed)
Social Work Patient ID: Andrea [name], female   DOB: 1925/07/25, 83 y.o.   MRN: [id number]  Met with pt to discuss team conference goals supervision-mod/i level and target discharge date 11/9. She was very pleased with the plan and feels she will be ready by then. Will make a home health referral, she has all her equipment needed. Work toward discharge on Friday.

## 2017-02-05 NOTE — Progress Notes (Signed)
Physical Therapy Session Note  Patient Details  Name: Andrea [name] MRN: [id number] Date of Birth: [birth year]  Today's Date: 02/05/2017 PT Individual Time: 1000-1100 PT Individual Time Calculation (min): 60 min   Short Term Goals: Week 1:  PT Short Term Goal 1 (Week 1): Pt will perform sit <> stands with min assist consistently PT Short Term Goal 2 (Week 1): Pt will be able to gait with min assist with LRAD x 150' PT Short Term Goal 3 (Week 1): Pt will be able to perform 4 steps with rails for functional strengthening with min assist  Skilled Therapeutic Interventions/Progress Updates:    no c/o pain.  Session focus on gait with LRAD, balance, strengthening, and LUE coordination.    Pt transfers throughout session with supervision.  Gait to and from therapy gym with RW and supervision.  Gait with SPC for increased challenge to balance, x125' with min guard, min verbal cues for upright posture and relaxed LUE.  LUE NMR task for fine motor control, object manipulation, and gross motor control with graded clothespin task, added 1.5# wrist weight for improved gross motor coordination.  PT instructed pt in BLE therex with 2.5# weights LAQ, standing marching, and standing heel raises for strengthening and balance.  Pt positioned in recliner at end of session, call bell in reach and needs met.   Therapy Documentation Precautions:  Precautions Precautions: Fall Restrictions Weight Bearing Restrictions: No   See Function Navigator for Current Functional Status.   Therapy/Group: Individual Therapy  Michel Santee 02/05/2017, 12:10 PM

## 2017-02-05 NOTE — Progress Notes (Signed)
Occupational Therapy Discharge Summary  Patient Details  Name: Caroleen Stoermer MRN: [id number] Date of Birth: 08-28-1925  Patient has met 13 of 1 long term goals due to improved activity tolerance, improved balance, postural control, ability to compensate for deficits, functional use of  LEFT upper and LEFT lower extremity and improved coordination.  Patient to discharge at overall Supervision level.  Patient's care partner is independent to provide the necessary physical assistance at discharge.    Reasons goals not met: n/a  Recommendation:  Patient will benefit from ongoing skilled OT services in home health setting to continue to advance functional skills in the area of BADL.  Equipment: No equipment provided -pt has all needed DME  Reasons for discharge: treatment goals met and discharge from hospital  Patient/family agrees with progress made and goals achieved: Yes  OT Discharge Precautions/Restrictions  Precautions Precautions: Fall Restrictions Weight Bearing Restrictions: No Pain Pain Assessment Pain Assessment: Faces Pain Score: 0-No pain ADL ADL ADL Comments: see functional navigator Vision Baseline Vision/History: No visual deficits Patient Visual Report: No change from baseline Vision Assessment?: No apparent visual deficits Perception  Perception: Within Functional Limits Praxis Praxis: Intact Cognition Overall Cognitive Status: Within Functional Limits for tasks assessed Arousal/Alertness: Awake/alert Orientation Level: Oriented X4 Attention: Selective Awareness: Appears intact Problem Solving: Appears intact Safety/Judgment: Appears intact Sensation Sensation Additional Comments: LUE ataxia Coordination Gross Motor Movements are Fluid and Coordinated: No Fine Motor Movements are Fluid and Coordinated: No Coordination and Movement Description: improved LLE ataxia, ongoing LUE ataxia Motor  Motor Motor: Ataxia Motor - Discharge Observations:  hemiparesis improving, LUE ataxia ongoing Mobility  Transfers Sit to Stand: 6: Modified independent (Device/Increase time) Stand to Sit: 6: Modified independent (Device/Increase time)  Trunk/Postural Assessment  Lumbar Assessment Lumbar Assessment: (posterior pelvic tilt) Postural Control Postural Control: Within Functional Limits  Balance Static Sitting Balance Static Sitting - Level of Assistance: 6: Modified independent (Device/Increase time) Dynamic Sitting Balance Dynamic Sitting - Level of Assistance: 6: Modified independent (Device/Increase time) Static Standing Balance Static Standing - Level of Assistance: 6: Modified independent (Device/Increase time) Dynamic Standing Balance Dynamic Standing - Level of Assistance: 5: Stand by assistance Extremity/Trunk Assessment RUE Assessment RUE Assessment: Within Functional Limits LUE Assessment LUE Assessment: Exceptions to [facility] LUE Strength LUE Overall Strength Comments: decreased strength in LUE (L>R)   See Function Navigator for Current Functional Status.  Raymondo Band 02/06/2017, 4:44 PM

## 2017-02-05 NOTE — Progress Notes (Signed)
Subjective/Complaints:  Slept ok, no issues overnite  ROS- No CP, SOB, N/V/D  Objective: Vital Signs: Blood pressure 127/86, pulse 72, temperature 98.7 F (37.1 C), temperature source Oral, resp. rate 18, height 5' 4"  (1.626 m), weight 80 kg (176 lb 4.8 oz), SpO2 99 %. No results found. Results for orders placed or performed during the hospital encounter of 01/28/17 (from the past 72 hour(s))  Creatinine, serum     Status: Abnormal   Collection Time: 02/04/17  4:41 AM  Result Value Ref Range   Creatinine, Ser 1.09 (H) 0.44 - 1.00 mg/dL   GFR calc non Af Amer 44 (L) >60 mL/min   GFR calc Af Amer 51 (L) >60 mL/min    Comment: (NOTE) The eGFR has been calculated using the CKD EPI equation. This calculation has not been validated in all clinical situations. eGFR's persistently <60 mL/min signify possible Chronic Kidney Disease.      HEENT: normal Cardio: RRR and no murmur Resp: CTA B/L and unlabored GI: BS positive and NT, ND Extremity:  No Edema Skin:   Other Left lat leg blood filled bulla Neuro: Alert/Oriented, Abnormal Sensory redueced LT left foot, Abnormal Motor 4/5 Left delt, bi, tri, grip, HF, KE, ADF and Abnormal FMC Ataxic/ dec FMC Musc/Skel:  Bilateral knee Arthritic changes, bilateral hand PIP DIP arthritic changes Gen NAD   Assessment/Plan: 1. Functional deficits secondary to Right thalamic infarct which require 3+ hours per day of interdisciplinary therapy in a comprehensive inpatient rehab setting. Physiatrist is providing close team supervision and 24 hour management of active medical problems listed below. Physiatrist and rehab team continue to assess barriers to discharge/monitor patient progress toward functional and medical goals. FIM: Function - Bathing Position: Wheelchair/chair at sink Body parts bathed by patient: Front perineal area, Abdomen, Chest, Left arm, Right arm Body parts bathed by helper: Back Assist Level: Touching or steadying  assistance(Pt > 75%)  Function- Upper Body Dressing/Undressing What is the patient wearing?: Pull over shirt/dress Pull over shirt/dress - Perfomed by patient: Thread/unthread right sleeve, Thread/unthread left sleeve, Put head through opening, Pull shirt over trunk Pull over shirt/dress - Perfomed by helper: Pull shirt over trunk Assist Level: Set up, Supervision or verbal cues Set up : To obtain clothing/put away Function - Lower Body Dressing/Undressing What is the patient wearing?: Underwear, Socks, Shoes, Pants Position: Other (comment)(on 3:1 over toilet) Underwear - Performed by patient: Thread/unthread right underwear leg, Thread/unthread left underwear leg, Pull underwear up/down Underwear - Performed by helper: Pull underwear up/down, Thread/unthread right underwear leg Pants- Performed by patient: Thread/unthread left pants leg, Thread/unthread right pants leg, Pull pants up/down Pants- Performed by helper: Fasten/unfasten pants Non-skid slipper socks- Performed by patient: Don/doff right sock, Don/doff left sock Socks - Performed by helper: Don/doff right sock, Don/doff left sock Shoes - Performed by patient: Don/doff right shoe, Don/doff left shoe Shoes - Performed by helper: Don/doff right shoe, Don/doff left shoe, Fasten right, Fasten left Assist for footwear: Partial/moderate assist Assist for lower body dressing: Touching or steadying assistance (Pt > 75%)  Function - Toileting Toileting steps completed by patient: Adjust clothing prior to toileting, Performs perineal hygiene, Adjust clothing after toileting Toileting steps completed by helper: Adjust clothing after toileting Toileting Assistive Devices: Grab bar or rail Assist level: Supervision or verbal cues  Function - Air cabin crew transfer assistive device: Walker Assist level to toilet: Touching or steadying assistance (Pt > 75%) Assist level from toilet: Touching or steadying assistance (Pt >  75%) Assist level  to bedside commode (at bedside): Moderate assist (Pt 50 - 74%/lift or lower) Assist level from bedside commode (at bedside): Moderate assist (Pt 50 - 74%/lift or lower)  Function - Chair/bed transfer Chair/bed transfer method: Stand pivot, Ambulatory Chair/bed transfer assist level: Touching or steadying assistance (Pt > 75%) Chair/bed transfer assistive device: Walker Chair/bed transfer details: Manual facilitation for weight shifting, Manual facilitation for weight bearing  Function - Locomotion: Wheelchair Will patient use wheelchair at discharge?: No Type: Manual Max wheelchair distance: 32f  Assist Level: Moderate assistance (Pt 50 - 74%) Assist Level: Moderate assistance (Pt 50 - 74%) Wheel 150 feet activity did not occur: Safety/medical concerns Turns around,maneuvers to table,bed, and toilet,negotiates 3% grade,maneuvers on rugs and over doorsills: No Function - Locomotion: Ambulation Assistive device: Walker-rolling Max distance: 120 Assist level: Touching or steadying assistance (Pt > 75%) Assist level: Touching or steadying assistance (Pt > 75%) Assist level: Supervision or verbal cues Walk 150 feet activity did not occur: Safety/medical concerns Assist level: Supervision or verbal cues Walk 10 feet on uneven surfaces activity did not occur: Safety/medical concerns Assist level: Touching or steadying assistance (Pt > 75%)  Function - Comprehension Comprehension: Auditory Comprehension assist level: Follows complex conversation/direction with extra time/assistive device  Function - Expression Expression: Verbal Expression assist level: Expresses complex ideas: With extra time/assistive device  Function - Social Interaction Social Interaction assist level: Interacts appropriately with others with medication or extra time (anti-anxiety, antidepressant).  Function - Problem Solving Problem solving assist level: Solves complex problems: With extra  time  Function - Memory Memory assist level: Recognizes or recalls 90% of the time/requires cueing < 10% of the time Patient normally able to recall (first 3 days only): Current season, Location of own room, Staff names and faces, That he or she is in a hospital  Medical Problem List and Plan: 1. Left hemiparesissecondary to right thalamic infarction -CIR PT, OT - Team conference today please see physician documentation under team conference tab, met with team face-to-face to discuss problems,progress, and goals. Formulized individual treatment plan based on medical history, underlying problem and comorbidities. 2. DVT Prophylaxis/Anticoagulation: Subcutaneous Lovenox. Monitor for any bleeding episodes Plt 380 3. Pain Management: Tylenol as needed, Knee OA improved on voltaren gel, may use for hands as well    4. Mood: Provide emotional support 5. Neuropsych: This patient iscapable of making decisions on herown behalf. 6. Skin/Wound Care: Routine skin checks -local skin tear to abrasions 7. Fluids/Electrolytes/Nutrition: Routine I&O's Creat nl 11/6 8.Hypertension. Maxide 37.5-25 mg daily at home ,some low BPs during the day will hold amlodipine and monitor Vitals:   02/04/17 1444 02/05/17 0552  BP: (!) 109/47 127/86  Pulse: 66 72  Resp: 20 18  Temp: 98.6 F (37 C) 98.7 F (37.1 C)  SpO2: 97% 99%   9.Hyperlipidemia. Lipitor. 10.Hypothyroidism. Synthroid. TSH 31.569. Plans to follow-up thyroid panels in 2weeks  LOS (Days) 8 A FACE TO FACE EVALUATION WAS PERFORMED  Alilah Mcmeans E 02/05/2017, 7:31 AM

## 2017-02-05 NOTE — Plan of Care (Signed)
  Progressing Consults RH STROKE PATIENT EDUCATION Description See Patient Education module for education specifics   02/05/2017 2234 - Progressing by Sissy HoffBarber, Amila Callies M, RN RH SKIN INTEGRITY RH STG MAINTAIN SKIN INTEGRITY WITH ASSISTANCE Description STG Maintain Skin Integrity With Min Assistance.  02/05/2017 2234 - Progressing by Sissy HoffBarber, Micheil Klaus M, RN RH SAFETY RH STG ADHERE TO SAFETY PRECAUTIONS W/ASSISTANCE/DEVICE Description STG Adhere to Safety Precautions With supervision/cues Mod I  Assistance/Device.   02/05/2017 2234 - Progressing by Sissy HoffBarber, Benjermin Korber M, RN RH KNOWLEDGE DEFICIT RH STG INCREASE KNOWLEDGE OF DIABETES 02/05/2017 2234 - Progressing by Sissy HoffBarber, Alisse Tuite M, RN RH STG INCREASE KNOWLEDGE OF HYPERTENSION 02/05/2017 2234 - Progressing by Sissy HoffBarber, Mahlik Lenn M, RN

## 2017-02-05 NOTE — Progress Notes (Signed)
Occupational Therapy Session Note  Patient Details  Name: Andrea [name] MRN: [id number] Date of Birth: 11/27/1925  Today's Date: 02/05/2017  Session 1 OT Individual Time: 3142-7670 OT Individual Time Calculation (min): 64 min   Session 2 OT Individual Time: 1330-1415 OT Individual Time Calculation (min): 45 min    Short Term Goals: Week 1:  OT Short Term Goal 1 (Week 1): Pt will complete 2/3 toileting tasks with steadying assist OT Short Term Goal 2 (Week 1): Pt will don socks/shoes with min A OT Short Term Goal 3 (Week 1): Pt will complete grooming task in standing with supervision  Skilled Therapeutic Interventions/Progress Updates:    Session 1 OT treatment session focused on modified bathing dressing, safety awareness, and L NMR. Pt ambulated to dresser with verbal cues for RW placement to access to drawers. Pt collected clothing, then ambulated to the bathroom w/ supervision. Pt transferred onto tub bench w/ supervision. Bathing completed with set-up A/supervision. Pt needed min A for Sit<>stand and transfer out of shower, then progressed to supervision with ambulation. Incorporated L NMR with bathing/dressing tasks including weight bearing, opening containers, and grasping wash cloth. Pt left seated in recliner at end of session with lunch.  Session 2 OT treatment session focused on L NMR and L fine motor control. Pt ambulated to therapy gym w/ RW and supervision. Placed small wedge under sits bones to promote anterior pelvis tilt. Graded buttoning task completed with increased time needed to button/unbutton smaller buttons. Threading and tying activity with 6 repetitions. Increased ease of tying task with practice. Card sorting activity focused on thumb opposition and in-hand manipulation of cards. Pt ambulated back to room with RW and supervision. Pt left seated in recliner with needs met.   Therapy Documentation Precautions:  Precautions Precautions: Fall Restrictions Weight  Bearing Restrictions: No Pain:  none/denies pain  See Function Navigator for Current Functional Status.   Therapy/Group: Individual Therapy  Valma Cava 02/05/2017, 2:19 PM

## 2017-02-05 NOTE — Patient Care Conference (Signed)
Inpatient RehabilitationTeam Conference and Plan of Care Update Date: 02/05/2017   Time: 11:10 AM    Patient Name: Andrea [name]      Medical Record Number: [id number]  Date of Birth: 02/20/1926 Sex: Female         Room/Bed: 4M01C/4M01C-01 Payor Info: Payor: MEDICARE / Plan: MEDICARE PART A AND B / Product Type: *No Product type* /    Admitting Diagnosis: R CVA  Admit Date/Time:  01/28/2017  3:34 PM Admission Comments: No comment available   Primary Diagnosis:  Right thalamic infarction [facility](HCC) Principal Problem: Right thalamic infarction [facility](HCC)  Patient Active Problem List   Diagnosis Date Noted  . Right thalamic infarction (HCC) 01/28/2017  . Thalamic stroke (HCC)   . TIA (transient ischemic attack) 01/25/2017  . Hypernatremia 01/25/2017  . CVA (cerebral vascular accident) (HCC) 01/25/2017  . Intertrochanteric fracture of left hip (HCC) 09/01/2014  . Hypothyroidism 09/01/2014  . Hypertension 09/01/2014  . Closed left hip fracture (HCC) 09/01/2014    Expected Discharge Date: Expected Discharge Date: 02/07/17  Team Members Present: Physician leading conference: Dr. Claudette LawsAndrew Kirsteins Social Worker Present: Dossie DerBecky Jameica Couts, LCSW Nurse Present: Allayne Stackhelsey Evans, RN PT Present: Teodoro Kilaitlin Penven-Crew, PT OT Present: Kearney HardElisabeth Doe, OT SLP Present: Jackalyn LombardNicole Page, SLP PPS Coordinator present : Tora DuckMarie Noel, RN, CRRN     Current Status/Progress Goal Weekly Team Focus  Medical   Blood pressure under better control.  Sleeping well.  Motor strength deficits have largely resolved.  Still has some mild balance deficits.  Home with minimal support.  Reduce fall risk.  Discharge planning   Bowel/Bladder   continent of b/b, LBM 11/3 after sorbitol, dose of sorbitol 11/6 s results. pt has hx of constipation, can we get a senna order?  continent of b/b with mod I assist  monitor b/b, maintain continence, bm q 1-2 days.   Swallow/Nutrition/ Hydration             ADL's   supervision bathing, LB  self-care, transfers, and UB self-care  mod I overall with supervision for IADL, bathing and shower transfer  ADL retraining, balance, functional transfers, L NMR, pt/family ed   Mobility   supervision overall  supervision overall  d/c planning, activity tolerance, coordination, balance    Communication             Safety/Cognition/ Behavioral Observations            Pain   pain is not an issue         Skin   skin tear to left ankle with foam dressing. bruising to arms  mod I assist with wounds , prevent skin breakdown while on IPR  monitor skin q shift and prn, change foam prn      *See Care Plan and progress notes for long and short-term goals.     Barriers to Discharge  Current Status/Progress Possible Resolutions Date Resolved   Physician    Decreased caregiver support     Progressing well towards discharge  Plan discharge in a.m. with home health follow-up      Nursing                  PT                    OT                  SLP                SW  Discharge Planning/Teaching Needs:  Home with daughter who is there but can not assist her. Needs to be mod/i level if possible      Team Discussion:  Goals mod/i-supervision level and doing well. Had some constipation issues which has resolved and friend/neighbor here to observe in therapies today. Medically stable for DC Friday. Daughrer is there at home and can provide supervision level.  Revisions to Treatment Plan:  DC 11/9    Continued Need for Acute Rehabilitation Level of Care: The patient requires daily medical management by a physician with specialized training in physical medicine and rehabilitation for the following conditions: Daily direction of a multidisciplinary physical rehabilitation program to ensure safe treatment while eliciting the highest outcome that is of practical value to the patient.: Yes Daily medical management of patient stability for increased activity during participation  in an intensive rehabilitation regime.: Yes Daily analysis of laboratory values and/or radiology reports with any subsequent need for medication adjustment of medical intervention for : Neurological problems  Suhail Peloquin, Lemar LivingsRebecca G 02/06/2017, 3:56 PM

## 2017-02-05 NOTE — Progress Notes (Signed)
Physical Therapy Session Note  Patient Details  Name: Andrea [name] MRN: [id number] Date of Birth: 10/24/1925  Today's Date: 02/05/2017 PT Individual Time: 4401-02720900-0945 PT Individual Time Calculation (min): 45 min   Short Term Goals: Week 1:  PT Short Term Goal 1 (Week 1): Pt will perform sit <> stands with min assist consistently PT Short Term Goal 2 (Week 1): Pt will be able to gait with min assist with LRAD x 150' PT Short Term Goal 3 (Week 1): Pt will be able to perform 4 steps with rails for functional strengthening with min assist  Skilled Therapeutic Interventions/Progress Updates:    Pt supine in bed upon PT arrival, agreeable to therapy tx and denies pain. Pt transferred from supine to sitting with supervision. Pt ambulated within room using RW and supervision in order to gather clothes to get dressed. Pt performed toileting and upper/lower body dressing with supervision. Pt ambulated from room<>gymx 120 ft each way using RW and supervision. Pt worked on dynamic standing balance while navigating obstacle course walking around cones and stepping over objects with min assist using RW. Pt performed x 5 sit<>stands with supervision for LE strengthening. Pt left seated in recliner at end of session with needs in reach and friend present.   Therapy Documentation Precautions:  Precautions Precautions: Fall Restrictions Weight Bearing Restrictions: No   See Function Navigator for Current Functional Status.   Therapy/Group: Individual Therapy  Cresenciano GenreEmily van Schagen, PT, DPT 02/05/2017, 7:52 AM

## 2017-02-06 ENCOUNTER — Inpatient Hospital Stay (HOSPITAL_COMMUNITY): Payer: Medicare Other | Admitting: Occupational Therapy

## 2017-02-06 ENCOUNTER — Inpatient Hospital Stay (HOSPITAL_COMMUNITY): Payer: Medicare Other | Admitting: Physical Therapy

## 2017-02-06 NOTE — Progress Notes (Signed)
Occupational Therapy Session Note  Patient Details  Name: Andrea [name] MRN: [id number] Date of Birth: 07/31/1925  Today's Date: 02/06/2017 OT Individual Time: 1305-1405 OT Individual Time Calculation (min): 60 min   Skilled Therapeutic Interventions/Progress Updates: Today was patient graduation day.   She has increased to the level of being Modified Independent in her room for toileting and preparing for therapy, in keeping with resting if needed to to rest her left knee.   She stated she feels she is scheduled and ready to go home, tomorrow, Friday.   However, she stated one of her nurses informed her that she needs to have a BM before she can leave tomorrow.  For helping with her BM, she clinician offered to help educate patient on abdomenal 'massage' to help 'stir up, stimulate, and release' for having a BM.    Unfortunately, she was only able to pass gas when she attempted to toilet and have a BM.   She was able to complete all aspects of clothing mgmt via walker and 3:1 during this session (Mod I for toileting this session).     Therapy Documentation Precautions:  Precautions Precautions: Fall Restrictions Weight Bearing Restrictions: No  Pain:denied   See Function Navigator for Current Functional Status.   Therapy/Group: Individual Therapy  Bud Faceickett, Branson Kranz [facility] PlymouthYeary 02/06/2017, 2:36 PM

## 2017-02-06 NOTE — Plan of Care (Signed)
  Progressing Consults RH STROKE PATIENT EDUCATION Description See Patient Education module for education specifics   02/06/2017 1220 - Progressing by Alfonso RamusEvans, Parker Sawatzky S, RN RH SKIN INTEGRITY RH STG MAINTAIN SKIN INTEGRITY WITH ASSISTANCE Description STG Maintain Skin Integrity With Min Assistance.  02/06/2017 1220 - Progressing by Alfonso RamusEvans, Charlynn Salih S, RN

## 2017-02-06 NOTE — Progress Notes (Addendum)
Physical Therapy Discharge Summary  Patient Details  Name: Andrea [name] MRN: [id number] Date of Birth: Mar 01, 1926  Today's Date: 02/06/2017 PT Individual Time: 0900-0955 PT Individual Time Calculation (min): 55 min    Patient has met 10 of 10 long term goals due to improved activity tolerance, improved balance, improved postural control, increased strength, ability to compensate for deficits, improved attention, improved awareness and improved coordination.  Patient to discharge at an ambulatory level supervision to mod I.    Recommendation:  Patient will benefit from ongoing skilled PT services in home health setting to continue to advance safe functional mobility, address ongoing impairments in balance, motor control, and activity tolerance, and minimize fall risk.  Equipment: No equipment provided  Reasons for discharge: treatment goals met  Patient/family agrees with progress made and goals achieved: Yes   Skilled PT intervention: No c/o pain.  Session focus on pt education, d/c assessment, and dynamic standing balance.    Pt transfers throughout session mod I.  Ambulation on unit, max distance 200', with supervision and RW.  PT completed discharge assessment, see below.  Provided education to pt on f/u therapy, home access, use of RW at d/c, etc. Pt verbalized understanding of all.  Pt completes 4 trials of Biodex limits of stability training, 32% accuracy>63%>58%>68%.  Pt returned to room at end of session, positioned in recliner with call bell in reach and needs met.   PT Discharge Precautions/Restrictions Restrictions Weight Bearing Restrictions: No Pain Pain Assessment Pain Assessment: No/denies pain Pain Score: 0-No pain Vision/Perception  Perception Perception: Within Functional Limits Praxis Praxis: Intact  Cognition Overall Cognitive Status: Within Functional Limits for tasks assessed Arousal/Alertness: Awake/alert Orientation Level: Oriented X4 Attention:  Selective Selective Attention: Appears intact Awareness: Appears intact Problem Solving: Appears intact Safety/Judgment: Appears intact Sensation Sensation Light Touch: Impaired by gross assessment(N/T L hand) Additional Comments: LUE ataxia Coordination Gross Motor Movements are Fluid and Coordinated: No Fine Motor Movements are Fluid and Coordinated: No Coordination and Movement Description: improved LLE ataxia, ongoing LUE ataxia Motor  Motor Motor: Ataxia Motor - Discharge Observations: hemiparesis improving, LUE ataxia ongoing  Mobility Bed Mobility Rolling Right: 6: Modified independent (Device/Increase time) Rolling Left: 6: Modified independent (Device/Increase time) Supine to Sit: 6: Modified independent (Device/Increase time) Sit to Supine: 6: Modified independent (Device/Increase time) Transfers Transfers: Yes Sit to Stand: 6: Modified independent (Device/Increase time) Stand to Sit: 6: Modified independent (Device/Increase time) Stand Pivot Transfers: 6: Modified independent (Device/Increase time) Locomotion  Ambulation Ambulation: Yes Ambulation/Gait Assistance: 5: Supervision Ambulation Distance (Feet): 200 Feet Assistive device: Rolling walker Gait Gait Pattern: Impaired Gait Pattern: Step-through pattern;Trunk flexed Stairs / Additional Locomotion Stairs: Yes Stairs Assistance: 5: Supervision Stair Management Technique: Two rails Number of Stairs: 8 Height of Stairs: 3 Wheelchair Mobility Wheelchair Mobility: No  Trunk/Postural Assessment  Cervical Assessment Cervical Assessment: (forward head) Thoracic Assessment Thoracic Assessment: (rounded shoulders, mild kyphosis) Lumbar Assessment Lumbar Assessment: (posterior pelvic tilt) Postural Control Postural Control: Within Functional Limits  Balance Static Sitting Balance Static Sitting - Level of Assistance: 6: Modified independent (Device/Increase time) Dynamic Sitting Balance Dynamic Sitting -  Level of Assistance: 6: Modified independent (Device/Increase time) Static Standing Balance Static Standing - Level of Assistance: 6: Modified independent (Device/Increase time) Dynamic Standing Balance Dynamic Standing - Level of Assistance: 5: Stand by assistance Extremity Assessment  RUE Assessment RUE Assessment: Exceptions to [facility]   RLE Assessment RLE Assessment: Within Functional Limits RLE Strength Right Hip Flexion: 4+/5 Right Knee Flexion: 5/5 Right Knee Extension: 5/5  Right Ankle Dorsiflexion: 5/5 Right Ankle Plantar Flexion: 5/5 LLE Assessment LLE Assessment: Within Functional Limits LLE Strength Left Hip Flexion: 4+/5 Left Knee Flexion: 4+/5 Left Knee Extension: 4+/5 Left Ankle Dorsiflexion: 4+/5 Left Ankle Plantar Flexion: 4+/5   See Function Navigator for Current Functional Status.  Michel Santee 02/06/2017, 9:57 AM

## 2017-02-06 NOTE — Plan of Care (Signed)
  Progressing Consults RH STROKE PATIENT EDUCATION Description See Patient Education module for education specifics   02/06/2017 1220 - Progressing by Alfonso RamusEvans, Mana Haberl S, RN RH SKIN INTEGRITY RH STG MAINTAIN SKIN INTEGRITY WITH ASSISTANCE Description STG Maintain Skin Integrity With Min Assistance.  02/06/2017 1220 - Progressing by Alfonso RamusEvans, Creed Kail S, RN RH SAFETY RH STG ADHERE TO SAFETY PRECAUTIONS W/ASSISTANCE/DEVICE Description STG Adhere to Safety Precautions With supervision/cues Mod I  Assistance/Device.   02/06/2017 1222 - Progressing by Alfonso RamusEvans, Amaiya Scruton S, RN RH KNOWLEDGE DEFICIT RH STG INCREASE KNOWLEDGE OF DIABETES 02/06/2017 1222 - Progressing by Alfonso RamusEvans, Sharlisa Hollifield S, RN   Not Progressing RH BOWEL ELIMINATION RH STG MANAGE BOWEL WITH ASSISTANCE Description STG Manage Bowel with  Mod I Assistance.  02/06/2017 1220 - Not Progressing by Alfonso RamusEvans, Lari Linson S, RN RH STG MANAGE BOWEL W/MEDICATION W/ASSISTANCE Description STG Manage Bowel with Medication with  Mod I Assistance.  02/06/2017 1220 - Not Progressing by Alfonso RamusEvans, Edmund Rick S, RN Note No BM with multiple medications since 02/01/17

## 2017-02-06 NOTE — Progress Notes (Signed)
Subjective/Complaints:  C/o constipation, started on MiraLAX yesterday.  No results thus far.  Discussed her blood pressure medications. We discussed  discharge date as well  ROS- No CP, SOB, N/V/D  Objective: Vital Signs: Blood pressure (!) 125/59, pulse 65, temperature 97.7 F (36.5 C), temperature source Oral, resp. rate 16, height 5' 4"  (1.626 m), weight 80 kg (176 lb 4.8 oz), SpO2 100 %. No results found. Results for orders placed or performed during the hospital encounter of 01/28/17 (from the past 72 hour(s))  Creatinine, serum     Status: Abnormal   Collection Time: 02/04/17  4:41 AM  Result Value Ref Range   Creatinine, Ser 1.09 (H) 0.44 - 1.00 mg/dL   GFR calc non Af Amer 44 (L) >60 mL/min   GFR calc Af Amer 51 (L) >60 mL/min    Comment: (NOTE) The eGFR has been calculated using the CKD EPI equation. This calculation has not been validated in all clinical situations. eGFR's persistently <60 mL/min signify possible Chronic Kidney Disease.      HEENT: normal Cardio: RRR and no murmur Resp: CTA B/L and unlabored GI: BS positive and NT, ND Extremity:  No Edema Skin:   Other Left lat leg blood filled bulla Neuro: Alert/Oriented, Abnormal Sensory redueced LT left foot, Abnormal Motor 4/5 Left delt, bi, tri, grip, HF, KE, ADF and Abnormal FMC Ataxic/ dec FMC Musc/Skel:  Bilateral knee Arthritic changes, bilateral hand PIP DIP arthritic changes Gen NAD   Assessment/Plan: 1. Functional deficits secondary to Right thalamic infarct which require 3+ hours per day of interdisciplinary therapy in a comprehensive inpatient rehab setting. Physiatrist is providing close team supervision and 24 hour management of active medical problems listed below. Physiatrist and rehab team continue to assess barriers to discharge/monitor patient progress toward functional and medical goals. FIM: Function - Bathing Position: Shower Body parts bathed by patient: Right arm, Left arm, Chest,  Front perineal area, Abdomen, Right upper leg, Left upper leg, Right lower leg, Left lower leg, Buttocks Body parts bathed by helper: Back Assist Level: Supervision or verbal cues  Function- Upper Body Dressing/Undressing What is the patient wearing?: Pull over shirt/dress Pull over shirt/dress - Perfomed by patient: Thread/unthread right sleeve, Thread/unthread left sleeve, Put head through opening, Pull shirt over trunk Pull over shirt/dress - Perfomed by helper: Pull shirt over trunk Assist Level: Supervision or verbal cues Set up : To obtain clothing/put away Function - Lower Body Dressing/Undressing What is the patient wearing?: Underwear, Pants, Socks, Shoes Position: Other (comment)(on 3:1 over toilet) Underwear - Performed by patient: Thread/unthread right underwear leg, Thread/unthread left underwear leg, Pull underwear up/down Underwear - Performed by helper: Pull underwear up/down, Thread/unthread right underwear leg Pants- Performed by patient: Thread/unthread right pants leg, Thread/unthread left pants leg, Pull pants up/down Pants- Performed by helper: Fasten/unfasten pants Non-skid slipper socks- Performed by patient: Don/doff right sock, Don/doff left sock Socks - Performed by patient: Don/doff right sock, Don/doff left sock Socks - Performed by helper: Don/doff right sock, Don/doff left sock Shoes - Performed by patient: Don/doff right shoe, Don/doff left shoe Shoes - Performed by helper: Don/doff right shoe, Don/doff left shoe, Fasten right, Fasten left Assist for footwear: Supervision/touching assist Assist for lower body dressing: Supervision or verbal cues  Function - Toileting Toileting steps completed by patient: Adjust clothing prior to toileting, Performs perineal hygiene, Adjust clothing after toileting Toileting steps completed by helper: Adjust clothing after toileting Toileting Assistive Devices: Grab bar or rail Assist level: Supervision or verbal  cues  Function - Air cabin crew transfer assistive device: Elevated toilet seat/BSC over toilet, Grab bar, Walker Assist level to toilet: Supervision or verbal cues Assist level from toilet: Supervision or verbal cues Assist level to bedside commode (at bedside): Moderate assist (Pt 50 - 74%/lift or lower) Assist level from bedside commode (at bedside): Moderate assist (Pt 50 - 74%/lift or lower)  Function - Chair/bed transfer Chair/bed transfer method: Stand pivot, Ambulatory Chair/bed transfer assist level: Supervision or verbal cues Chair/bed transfer assistive device: Walker Chair/bed transfer details: Manual facilitation for weight shifting, Manual facilitation for weight bearing  Function - Locomotion: Wheelchair Will patient use wheelchair at discharge?: No Type: Manual Max wheelchair distance: 26f  Assist Level: Moderate assistance (Pt 50 - 74%) Assist Level: Moderate assistance (Pt 50 - 74%) Wheel 150 feet activity did not occur: Safety/medical concerns Turns around,maneuvers to table,bed, and toilet,negotiates 3% grade,maneuvers on rugs and over doorsills: No Function - Locomotion: Ambulation Assistive device: Walker-rolling Max distance: 120 Assist level: Supervision or verbal cues Assist level: Supervision or verbal cues Assist level: Supervision or verbal cues Walk 150 feet activity did not occur: Safety/medical concerns Assist level: Supervision or verbal cues Walk 10 feet on uneven surfaces activity did not occur: Safety/medical concerns Assist level: Touching or steadying assistance (Pt > 75%)  Function - Comprehension Comprehension: Auditory Comprehension assist level: Follows complex conversation/direction with extra time/assistive device  Function - Expression Expression: Verbal Expression assist level: Expresses complex ideas: With extra time/assistive device  Function - Social Interaction Social Interaction assist level: Interacts  appropriately with others with medication or extra time (anti-anxiety, antidepressant).  Function - Problem Solving Problem solving assist level: Solves complex problems: With extra time  Function - Memory Memory assist level: Recognizes or recalls 90% of the time/requires cueing < 10% of the time Patient normally able to recall (first 3 days only): Current season, Location of own room, Staff names and faces, That he or she is in a hospital  Medical Problem List and Plan: 1. Left hemiparesissecondary to right thalamic infarction -CIR PT, OT - 2. DVT Prophylaxis/Anticoagulation: Subcutaneous Lovenox. Monitor for any bleeding episodes Plt 380 3. Pain Management: Tylenol as needed, Knee OA improved on voltaren gel, may use for hands as well    4. Mood: Provide emotional support 5. Neuropsych: This patient iscapable of making decisions on herown behalf. 6. Skin/Wound Care: Routine skin checks -local skin tear to abrasions 7. Fluids/Electrolytes/Nutrition: Routine I&O's Creat nl 11/6 8.Hypertension. Maxide 37.5-25 mg daily at home, not needing this,BPs normalizing will cont amlodipine  Vitals:   02/05/17 1434 02/06/17 0500  BP: (!) 132/56 (!) 125/59  Pulse: 83 65  Resp: 16 16  Temp: 98.1 F (36.7 C) 97.7 F (36.5 C)  SpO2: 99% 100%   9.Hyperlipidemia. Lipitor. 10.Hypothyroidism. Synthroid. TSH 31.569. Plans to follow-up thyroid panels in 2weeks  LOS (Days) 9 A FACE TO FACE EVALUATION WAS PERFORMED  KIRSTEINS,ANDREW E 02/06/2017, 7:10 AM

## 2017-02-06 NOTE — Plan of Care (Signed)
All goals met. 

## 2017-02-06 NOTE — Discharge Summary (Signed)
Discharge summary job [id number]

## 2017-02-06 NOTE — Progress Notes (Signed)
Physical Therapy Session Note  Patient Details  Name: Andrea [name] MRN: [id number] Date of Birth: [birth year]  Today's Date: 02/06/2017 PT Individual Time: 1032-1100 PT Individual Time Calculation (min): 28 min   Short Term Goals: Week 1:  PT Short Term Goal 1 (Week 1): Pt will perform sit <> stands with min assist consistently PT Short Term Goal 2 (Week 1): Pt will be able to gait with min assist with LRAD x 150' PT Short Term Goal 3 (Week 1): Pt will be able to perform 4 steps with rails for functional strengthening with min assist  Skilled Therapeutic Interventions/Progress Updates:   Pt received sitting in WC and agreeable to PT  PT instructed pt in Gait training with RW 24f x 2 with supervision assist. Pt noted to take multiple rest breaks due to "hand stiffness" to adjust grip position. Gait training also instructed by PT over unlevel surface x 173fwith supervision assist and RW. Min cues for AD management.   Nustep reciprocal movement  And endurance training completed x 8 minutes level 3>4 with min cues for full ROM and increased step speed.   Pt instructed in picking cup up from floor with supervision assist and 1 UE support on RW. Min cues for proper positioning prior to reach to improved safety and stability.   [facility] and all needs met.        Therapy Documentation Precautions:  Precautions Precautions: Fall Restrictions Weight Bearing Restrictions: No General:   Vital Signs:  Pain: Pain Assessment Pain Assessment: No/denies pain Pain Score: 0-No pain Mobility: Bed Mobility Rolling Right: 6: Modified independent (Device/Increase time) Rolling Left: 6: Modified independent (Device/Increase time) Supine to Sit: 6: Modified independent (Device/Increase time) Sit to Supine: 6: Modified independent (Device/Increase time) Transfers Transfers: Yes Sit to Stand: 6: Modified independent  (Device/Increase time) Stand to Sit: 6: Modified independent (Device/Increase time) Stand Pivot Transfers: 6: Modified independent (Device/Increase time) Locomotion : Ambulation Ambulation: Yes Ambulation/Gait Assistance: 5: Supervision Ambulation Distance (Feet): 200 Feet Assistive device: Rolling walker Gait Gait Pattern: Impaired Gait Pattern: Step-through pattern;Trunk flexed Stairs / Additional Locomotion Stairs: Yes Stairs Assistance: 5: Supervision Stair Management Technique: Two rails Number of Stairs: 8 Height of Stairs: 3 Wheelchair Mobility Wheelchair Mobility: No  Trunk/Postural Assessment : Cervical Assessment Cervical Assessment: (forward head) Thoracic Assessment Thoracic Assessment: (rounded shoulders, mild kyphosis) Lumbar Assessment Lumbar Assessment: (posterior pelvic tilt) Postural Control Postural Control: Within Functional Limits  Balance: Static Sitting Balance Static Sitting - Level of Assistance: 6: Modified independent (Device/Increase time) Dynamic Sitting Balance Dynamic Sitting - Level of Assistance: 6: Modified independent (Device/Increase time) Static Standing Balance Static Standing - Level of Assistance: 6: Modified independent (Device/Increase time) Dynamic Standing Balance Dynamic Standing - Level of Assistance: 5: Stand by assistance Exercises:   Other Treatments:     See Function Navigator for Current Functional Status.   Therapy/Group: Individual Therapy  AuLorie Phenix1/10/2016, 11:01 AM

## 2017-02-06 NOTE — Progress Notes (Signed)
Social Work   Roni Friberg, Elveria Risingebecca G, LCSW  Social Worker  Physical Medicine and Rehabilitation  Patient Care Conference  Signed  Date of Service:  02/05/2017  1:06 PM          Signed          [] Hide copied text  [] Hover for details  Inpatient RehabilitationTeam Conference and Plan of Care Update Date: 02/05/2017   Time: 11:10 AM      Patient Name: Andrea [name]      Medical Record Number: [id number]  Date of Birth: 02/13/1926 Sex: Female         Room/Bed: 4M01C/4M01C-01 Payor Info: Payor: MEDICARE / Plan: MEDICARE PART A AND B / Product Type: *No Product type* /     Admitting Diagnosis: R CVA  Admit Date/Time:  01/28/2017  3:34 PM Admission Comments: No comment available    Primary Diagnosis:  Right thalamic infarction [facility](HCC) Principal Problem: Right thalamic infarction [facility](HCC)       Patient Active Problem List    Diagnosis Date Noted  . Right thalamic infarction (HCC) 01/28/2017  . Thalamic stroke (HCC)    . TIA (transient ischemic attack) 01/25/2017  . Hypernatremia 01/25/2017  . CVA (cerebral vascular accident) (HCC) 01/25/2017  . Intertrochanteric fracture of left hip (HCC) 09/01/2014  . Hypothyroidism 09/01/2014  . Hypertension 09/01/2014  . Closed left hip fracture (HCC) 09/01/2014      Expected Discharge Date: Expected Discharge Date: 02/07/17   Team Members Present: Physician leading conference: Dr. Claudette LawsAndrew Kirsteins Social Worker Present: Dossie DerBecky Reiley Bertagnolli, LCSW Nurse Present: Allayne Stackhelsey Evans, RN PT Present: Teodoro Kilaitlin Penven-Crew, PT OT Present: Kearney HardElisabeth Doe, OT SLP Present: Jackalyn LombardNicole Page, SLP PPS Coordinator present : Tora DuckMarie Noel, RN, CRRN       Current Status/Progress Goal Weekly Team Focus  Medical     Blood pressure under better control.  Sleeping well.  Motor strength deficits have largely resolved.  Still has some mild balance deficits.  Home with minimal support.  Reduce fall risk.  Discharge planning   Bowel/Bladder     continent of b/b, LBM 11/3 after  sorbitol, dose of sorbitol 11/6 s results. pt has hx of constipation, can we get a senna order?  continent of b/b with mod I assist  monitor b/b, maintain continence, bm q 1-2 days.   Swallow/Nutrition/ Hydration               ADL's     supervision bathing, LB self-care, transfers, and UB self-care  mod I overall with supervision for IADL, bathing and shower transfer  ADL retraining, balance, functional transfers, L NMR, pt/family ed   Mobility     supervision overall  supervision overall  d/c planning, activity tolerance, coordination, balance    Communication               Safety/Cognition/ Behavioral Observations             Pain     pain is not an issue         Skin     skin tear to left ankle with foam dressing. bruising to arms  mod I assist with wounds , prevent skin breakdown while on IPR  monitor skin q shift and prn, change foam prn     *See Care Plan and progress notes for long and short-term goals.      Barriers to Discharge   Current Status/Progress Possible Resolutions Date Resolved   Physician     Decreased caregiver support  Progressing well towards discharge  Plan discharge in a.m. with home health follow-up      Nursing                 PT                    OT                 SLP            SW              Discharge Planning/Teaching Needs:  Home with daughter who is there but can not assist her. Needs to be mod/i level if possible      Team Discussion:  Goals mod/i-supervision level and doing well. Had some constipation issues which has resolved and friend/neighbor here to observe in therapies today. Medically stable for DC Friday. Daughrer is there at home and can provide supervision level.  Revisions to Treatment Plan:  DC 11/9    Continued Need for Acute Rehabilitation Level of Care: The patient requires daily medical management by a physician with specialized training in physical medicine and rehabilitation for the following conditions: Daily  direction of a multidisciplinary physical rehabilitation program to ensure safe treatment while eliciting the highest outcome that is of practical value to the patient.: Yes Daily medical management of patient stability for increased activity during participation in an intensive rehabilitation regime.: Yes Daily analysis of laboratory values and/or radiology reports with any subsequent need for medication adjustment of medical intervention for : Neurological problems   Trevel Dillenbeck, Lemar LivingsRebecca G 02/06/2017, 3:56 PM

## 2017-02-06 NOTE — Progress Notes (Signed)
Occupational Therapy Session Note  Patient Details  Name: Andrea [name] MRN: [id number] Date of Birth: 11/22/1925  Today's Date: 02/06/2017 OT Individual Time: 1100-1200 OT Individual Time Calculation (min): 60 min    Short Term Goals: Week 1:  OT Short Term Goal 1 (Week 1): Pt will complete 2/3 toileting tasks with steadying assist OT Short Term Goal 2 (Week 1): Pt will don socks/shoes with min A OT Short Term Goal 3 (Week 1): Pt will complete grooming task in standing with supervision  Skilled Therapeutic Interventions/Progress Updates:    Pt received sitting in recliner, ready for OT tx session. Focus of session on ADL/self-care retraining. [facility] with supervision to obtain clothing from dresser. Transfers to tub bench in shower for bathing ADLs. Pt completes UB/LB bathing at sit<>stand level with overall supervision. Requires MinA for transfer from tub bench to RW, completing toileting, UB/LB dressing while seated on BSC over toilet. Pt completes toileting and dressing tasks with overall mod independence. Pt ambulates back to recliner for donning footwear, transitions to sitting EOB to each lunch at end of session with supervision during ambulation and transfer from recliner to EOB at RW level. Pt left seated EOB, lunch tray set up, call bell and needs within reach.   Therapy Documentation Precautions:  Precautions Precautions: Fall Restrictions Weight Bearing Restrictions: No   Pain: Pain Assessment Pain Assessment: No/denies pain  See Function Navigator for Current Functional Status.   Therapy/Group: Individual Therapy  Orlando PennerBreanna L Dewana Ammirati 02/06/2017, 4:33 PM

## 2017-02-07 MED ORDER — ATORVASTATIN CALCIUM 40 MG PO TABS: 40.0000 mg | ORAL_TABLET | Freq: Every day | ORAL | 0 refills | Status: DC

## 2017-02-07 MED ORDER — FAMOTIDINE 20 MG PO TABS: 20.0000 mg | ORAL_TABLET | Freq: Two times a day (BID) | ORAL | 0 refills | Status: DC

## 2017-02-07 MED ORDER — POLYETHYLENE GLYCOL 3350 17 G PO PACK: 17.0000 g | PACK | Freq: Every day | ORAL | 0 refills | Status: DC

## 2017-02-07 MED ORDER — TRIAMTERENE-HCTZ 37.5-25 MG PO TABS: 0.5000 | ORAL_TABLET | Freq: Every day | ORAL | 0 refills | Status: DC

## 2017-02-07 MED ORDER — DOXAZOSIN MESYLATE 4 MG PO TABS: 4.0000 mg | ORAL_TABLET | Freq: Every day | ORAL | 0 refills | Status: DC

## 2017-02-07 MED ORDER — DICLOFENAC SODIUM 1 % TD GEL: 4.0000 g | Freq: Four times a day (QID) | TRANSDERMAL | 1 refills | Status: DC

## 2017-02-07 MED ORDER — LEVOTHYROXINE SODIUM 125 MCG PO TABS: 125.0000 ug | ORAL_TABLET | Freq: Every day | ORAL | 0 refills | Status: DC

## 2017-02-07 MED ORDER — POLYETHYLENE GLYCOL 3350 17 G PO PACK: 17.0000 g | PACK | Freq: Two times a day (BID) | ORAL | 0 refills | Status: DC

## 2017-02-07 MED ORDER — AMLODIPINE BESYLATE 2.5 MG PO TABS: 2.5000 mg | ORAL_TABLET | Freq: Every day | ORAL | 0 refills | Status: DC

## 2017-02-07 NOTE — Discharge Summary (Signed)
NAMVelna Hatchet:  Schanz, Shizuye               ACCOUNT NO.:  [id number]  MEDICAL RECORD NO.:  [id number]  LOCATION:  [id number]                        FACILITY:  [facility]  PHYSICIAN:  Erick ColaceAndrew E. Kirsteins, M.D.DATE OF BIRTH:  June 20, 1925  DATE OF ADMISSION:  01/28/2017 DATE OF DISCHARGE:  02/07/2017                              DISCHARGE SUMMARY   DISCHARGE DIAGNOSES: 1. Right thalamic intracerebral infarction. 2. Subcutaneous Lovenox for DVT prophylaxis. 3. Pain management. 4. Hypertension. 5. Hyperlipidemia. 6. Hypothyroidism.  This is an 83 year old right-handed female with history of hypertension, recurrent UTI, hypothyroidism.  Lives with daughter, independent with a cane prior to admission and active.  One-level home.  Presented on January 25, 2017, with left-sided weakness and numbness, blood pressure 209/96.  Troponin negative.  Cranial CT scan negative.  She did not receive tPA.  MRI showed acute right thalamic infarction.  MRA with no large vessel occlusion.  Carotid Dopplers with no ICA stenosis. Echocardiogram with ejection fraction of 65%.  No wall motion abnormalities.  Maintained on aspirin for CVA prophylaxis as well as subcutaneous Lovenox for DVT prophylaxis.  Elevated TSH at 31.569 with free T4 and T3 pending.  She remained on Synthroid, plans to follow up thyroid panel as outpatient with primary care provider.  The patient was admitted for comprehensive rehab program.  PAST MEDICAL HISTORY:  See discharge diagnoses.  SOCIAL HISTORY:  She lives with daughter, independent with cane prior to admission.  FUNCTIONAL STATUS UPON ADMISSION TO REHAB SERVICES:  Min to mod assist 25 feet rolling walker, moderate assist sit to stand, min to mod assist stand pivot transfers, min to mod assist activities of daily living.  PHYSICAL EXAMINATION:  VITAL SIGNS:  Blood pressure 153/62, pulse 71, temperature 98, and respirations 20. GENERAL:  This was an alert female, in no acute distress,  oriented x3. HEENT:  EOMs intact. NECK:  Supple, nontender.  No JVD. CARDIAC:  Rate controlled. ABDOMEN:  Soft, nontender.  Good bowel sounds. LUNGS:  Clear to auscultation without wheeze. NEUROLOGICAL:  She had fair awareness of deficits and follows full commands.  REHABILITATION HOSPITAL COURSE:  The patient was admitted to Inpatient Rehab Services with therapies initiated on a 3-hour daily basis, consisting of physical therapy, occupational therapy, and rehabilitation nursing.  The following issues were addressed during the patient's rehabilitation stay.  Pertaining to Ms. Rallis's right thalamic infarction, remained stable, maintained on aspirin therapy. Subcutaneous Lovenox for DVT prophylaxis.  Pain management with Tylenol as well as Voltaren for some osteoarthritis to her knees.  Blood pressures controlled with no current antihypertensive medications.  She had some low blood pressures during the day.  She remained on low-dose Norvasc.  She had been on Maxzide prior to admission, remained on hold; Lipitor for hyperlipidemia.  Noted Synthroid for hypothyroidism, latest TSH of 31.569, advised to follow up thyroid panel 2 weeks after discharge.  The patient received weekly collaborative interdisciplinary team conferences to discuss estimated length of stay, family teaching, any barriers to discharge.  Patient transfers throughout sessions with supervision, ambulates to the therapy gym, rolling walker, supervision up to 125 feet.  Working with energy conservation techniques, fine motor skills, object manipulation, and gross motor control.  She  could gather her belongings for activities of daily living and homemaking, dressing, grooming and homemaking, ambulate to the dresser with verbal cues using her rolling walker.  Full family teaching was completed and plan discharge to home.  DISCHARGE MEDICATIONS: 1. Norvasc 2.5 mg p.o. daily. 2. Aspirin 81 mg p.o. daily. 3. Lipitor 20 mg  p.o. daily. 4. Voltaren 4 times a day to affected area. 5. Pepcid 20 mg p.o. b.i.d. 6. Synthroid 125 mcg p.o. daily. 7. MiraLAX twice daily, hold for loose stools. 8. Tylenol as needed.  DIET:  Her diet was carb modified.  She would follow up with Dr. Claudette LawsAndrew Kirsteins [facility] as [facility]; Dr. Roda ShuttersXu, Neurology Service, call for appointment, [facility].  SPECIAL INSTRUCTIONS:  Followup thyroid panel 2 weeks after discharge.     Mariam Dollaraniel Dyon Rotert, P.A.   ______________________________ Erick ColaceAndrew E. Kirsteins, M.D.    DA/MEDQ  D:  02/06/2017  T:  02/07/2017  Job:  [id number]  cc:   [facility] Erick ColaceAndrew E. Kirsteins, M.D. Dr. Roda ShuttersXu

## 2017-02-07 NOTE — Progress Notes (Signed)
Patient received all the discharged instructions from Dan,A PA before she left the hospital.

## 2017-02-07 NOTE — Progress Notes (Signed)
Social Work Discharge Note Discharge Note  The overall goal for the admission was met for:   Discharge location: Yes-HOME WITH DAUGHTER WHO CAN PROVIDE SUPERVISION LEVEL  Length of Stay: Yes-10 DAYS  Discharge activity level: Yes-MOD/I-SUPERVISION LEVEL  Home/community participation: Yes  Services provided included: MD, RD, PT, OT, SLP, RN, CM, Pharmacy and [place]: Medicare and Medicaid  Follow-up services arranged: Home Health: [facility] and Patient/Family has no preference for HH/DME agencies  Comments (or additional information):  Patient/Family verbalized understanding of follow-up arrangements: Yes  Individual responsible for coordination of the follow-up plan: SELF & ROBIN-DAUGHTER  Confirmed correct DME delivered: Andrea [name] 02/07/2017    Andrea [name]

## 2017-02-07 NOTE — Progress Notes (Signed)
Subjective/Complaints:  Patient is ambulating with walker in room no unsteadiness.    ROS- No CP, SOB, N/V/D  Objective: Vital Signs: Blood pressure (!) 134/50, pulse 62, temperature 98.1 F (36.7 C), temperature source Oral, resp. rate 18, height 5\' 4"  (1.626 m), weight 80 kg (176 lb 4.8 oz), SpO2 99 %. No results found. No results found for this or any previous visit (from the past 72 hour(s)).   HEENT: normal Cardio: RRR and no murmur Resp: CTA B/L and unlabored GI: BS positive and NT, ND Extremity:  No Edema Skin:   Other Left lat leg blood filled bulla Neuro: Alert/Oriented, Abnormal Sensory redueced LT left foot, Abnormal Motor 4/5 Left delt, bi, tri, grip, HF, KE, ADF and Abnormal FMC Ataxic/ dec FMC Musc/Skel:  Bilateral knee Arthritic changes, bilateral hand PIP DIP arthritic changes Gen NAD   Assessment/Plan: 1. Functional deficits secondary to Right thalamic infarct Stable for D/C today F/u PCP in 3-4 weeks F/u PM&R 2 weeks See D/C summary See D/C instructions FIM: Function - Bathing Position: Shower Body parts bathed by patient: Right arm, Left arm, Chest, Front perineal area, Abdomen, Right upper leg, Left upper leg, Right lower leg, Left lower leg, Buttocks Body parts bathed by helper: Back Assist Level: More than reasonable time(prefers washing at sink for now)  Function- Upper Body Dressing/Undressing What is the patient wearing?: Pull over shirt/dress Pull over shirt/dress - Perfomed by patient: Thread/unthread right sleeve, Thread/unthread left sleeve, Put head through opening, Pull shirt over trunk Pull over shirt/dress - Perfomed by helper: Pull shirt over trunk Assist Level: More than reasonable time Set up : To obtain clothing/put away Function - Lower Body Dressing/Undressing What is the patient wearing?: Underwear, Pants, Socks, Shoes Position: Other (comment)(3:1 over toilet ) Underwear - Performed by patient: Thread/unthread right underwear  leg, Thread/unthread left underwear leg, Pull underwear up/down Underwear - Performed by helper: Pull underwear up/down, Thread/unthread right underwear leg Pants- Performed by patient: Thread/unthread right pants leg, Thread/unthread left pants leg, Pull pants up/down Pants- Performed by helper: Fasten/unfasten pants Non-skid slipper socks- Performed by patient: Don/doff right sock, Don/doff left sock Socks - Performed by patient: Don/doff right sock, Don/doff left sock Socks - Performed by helper: Don/doff right sock, Don/doff left sock Shoes - Performed by patient: Don/doff right shoe, Don/doff left shoe Shoes - Performed by helper: Don/doff right shoe, Don/doff left shoe, Fasten right, Fasten left Assist for footwear: Setup Assist for lower body dressing: More than reasonable time  Function - Toileting Toileting steps completed by patient: Adjust clothing prior to toileting, Adjust clothing after toileting, Performs perineal hygiene Toileting steps completed by helper: Adjust clothing after toileting Toileting Assistive Devices: Grab bar or rail Assist level: More than reasonable time  Function - ArchivistToilet Transfers Toilet transfer assistive device: Walker, Elevated toilet seat/BSC over toilet Assist level to toilet: No Help, no cues, assistive device, takes more than a reasonable amount of time Assist level from toilet: No Help, no cues, assistive device, takes more than a reasonable amount of time Assist level to bedside commode (at bedside): No Help, no cues, assistive device, takes more than a reasonable amount of time Assist level from bedside commode (at bedside): No Help, no cues, assistive device, takes more than a reasonable amount of time  Function - Chair/bed transfer Chair/bed transfer method: Stand pivot Chair/bed transfer assist level: No Help, no cues, assistive device, takes more than a reasonable amount of time Chair/bed transfer assistive device: Armrests,  Walker Chair/bed transfer details:  Manual facilitation for weight shifting, Manual facilitation for weight bearing  Function - Locomotion: Wheelchair Will patient use wheelchair at discharge?: No Type: Manual Max wheelchair distance: 5750ft  Assist Level: Moderate assistance (Pt 50 - 74%) Assist Level: Moderate assistance (Pt 50 - 74%) Wheel 150 feet activity did not occur: Safety/medical concerns Turns around,maneuvers to table,bed, and toilet,negotiates 3% grade,maneuvers on rugs and over doorsills: No Function - Locomotion: Ambulation Assistive device: Walker-rolling Max distance: 22250ft  Assist level: Supervision or verbal cues Assist level: Supervision or verbal cues Assist level: Supervision or verbal cues Walk 150 feet activity did not occur: Safety/medical concerns Assist level: Supervision or verbal cues Walk 10 feet on uneven surfaces activity did not occur: Safety/medical concerns Assist level: Supervision or verbal cues  Function - Comprehension Comprehension: Auditory Comprehension assist level: Follows complex conversation/direction with extra time/assistive device  Function - Expression Expression: Verbal Expression assist level: Expresses complex ideas: With extra time/assistive device  Function - Social Interaction Social Interaction assist level: Interacts appropriately with others with medication or extra time (anti-anxiety, antidepressant).  Function - Problem Solving Problem solving assist level: Solves complex problems: With extra time  Function - Memory Memory assist level: Recognizes or recalls 90% of the time/requires cueing < 10% of the time Patient normally able to recall (first 3 days only): Current season, Location of own room, Staff names and faces, That he or she is in a hospital  Medical Problem List and Plan: 1. Left hemiparesissecondary to right thalamic infarction -CIR PT, OT, D/C today  - 2. DVT Prophylaxis/Anticoagulation:  Subcutaneous Lovenox. Monitor for any bleeding episodes Plt 380 3. Pain Management: Tylenol as needed, Knee OA improved on voltaren gel, may use for hands as well    4. Mood: Provide emotional support 5. Neuropsych: This patient iscapable of making decisions on herown behalf. 6. Skin/Wound Care: Routine skin checks -local skin tear to abrasions 7. Fluids/Electrolytes/Nutrition: Routine I&O's Creat nl 11/6 8.Hypertension. Maxide 37.5-25 mg daily at home, not needing this,BPs normalizing will cont amlodipine  Vitals:   02/06/17 1307 02/07/17 0500  BP: (!) 115/49 (!) 134/50  Pulse: 67 62  Resp: 20 18  Temp: 98.9 F (37.2 C) 98.1 F (36.7 C)  SpO2: 98% 99%   9.Hyperlipidemia. Lipitor. 10.Hypothyroidism. Synthroid. TSH 31.569. Plans to follow-up thyroid panels in 2weeks  LOS (Days) 10 A FACE TO FACE EVALUATION WAS PERFORMED  Andrea [name] E 02/07/2017, 7:47 AM

## 2017-02-07 NOTE — Discharge Instructions (Signed)
Inpatient Rehab Discharge Instructions  Velna Hatchetancy [place] Discharge date and time: No discharge date for patient encounter.   Activities/Precautions/ Functional Status: Activity: activity as tolerated Diet: diabetic diet Wound Care: none needed Functional status:  ___ No restrictions     ___ Walk up steps independently ___ 24/7 supervision/assistance   ___ Walk up steps with assistance ___ Intermittent supervision/assistance  ___ Bathe/dress independently ___ Walk with walker     _x STROKE/TIA DISCHARGE INSTRUCTIONS SMOKING Cigarette smoking nearly doubles your risk of having a stroke & is the single most alterable risk factor  If you smoke or have smoked in the last 12 months, you are advised to quit smoking for your health.  Most of the excess cardiovascular risk related to smoking disappears within a year of stopping.  Ask you doctor about anti-smoking medications  [place] Quit Line: 1-800-QUIT NOW  Free Smoking Cessation Classes [phone]  CHOLESTEROL Know your levels; limit fat & cholesterol in your diet  Lipid Panel     Component Value Date/Time   CHOL 180 01/26/2017 0546   TRIG 89 01/26/2017 0546   HDL 48 01/26/2017 0546   CHOLHDL 3.8 01/26/2017 0546   VLDL 18 01/26/2017 0546   LDLCALC 114 (H) 01/26/2017 0546      Many patients benefit from treatment even if their cholesterol is at goal.  Goal: Total Cholesterol (CHOL) less than 160  Goal:  Triglycerides (TRIG) less than 150  Goal:  HDL greater than 40  Goal:  LDL (LDLCALC) less than 100   BLOOD PRESSURE American Stroke Association blood pressure target is less that 120/80 mm/Hg  Your discharge blood pressure is:  BP: (!) 153/63(rn notified)  Monitor your blood pressure  Limit your salt and alcohol intake  Many individuals will require more than one medication for high blood pressure  DIABETES (A1c is a blood sugar average for last 3 months) Goal HGBA1c is under 7% (HBGA1c is blood sugar average for last 3  months)  Diabetes:     Lab Results  Component Value Date   HGBA1C 5.8 (H) 01/26/2017     Your HGBA1c can be lowered with medications, healthy diet, and exercise.  Check your blood sugar as directed by your physician  Call your physician if you experience unexplained or low blood sugars.  PHYSICAL ACTIVITY/REHABILITATION Goal is 30 minutes at least 4 days per week  Activity: Increase activity slowly, Therapies: Physical Therapy: Home Health Return to work:   Activity decreases your risk of heart attack and stroke and makes your heart stronger.  It helps control your weight and blood pressure; helps you relax and can improve your mood.  Participate in a regular exercise program.  Talk with your doctor about the best form of exercise for you (dancing, walking, swimming, cycling).  DIET/WEIGHT Goal is to maintain a healthy weight  Your discharge diet is: Diet heart healthy/carb modified Room service appropriate? Yes; Fluid consistency: Thin  liquids Your height is:  Height: 5\' 4"  (162.6 cm) Your current weight is: Weight: 80 kg (176 lb 4.8 oz) Your Body Mass Index (BMI) is:  BMI (Calculated): 30.25  Following the type of diet specifically designed for you will help prevent another stroke.  Your goal weight range is:    Your goal Body Mass Index (BMI) is 19-24.  Healthy food habits can help reduce 3 risk factors for stroke:  High cholesterol, hypertension, and excess weight.  RESOURCES Stroke/Support Group:  Call [phone]   STROKE EDUCATION PROVIDED/REVIEWED AND GIVEN TO  PATIENT Stroke warning signs and symptoms How to activate emergency medical system (call 911). Medications prescribed at discharge. Need for follow-up after discharge. Personal risk factors for stroke. Pneumonia vaccine given:  Flu vaccine given:  My questions have been answered, the writing is legible, and I understand these instructions.  I will adhere to these goals & educational materials that have been  provided to me after my discharge from the hospital.   __ Bathe/dress with assistance ___ Walk Independently    ___ Shower independently ___ Walk with assistance    ___ Shower with assistance ___ No alcohol     ___ Return to work/school ________  Special Instructions:  Follow-up thyroid panel 2 weeks with primary care provider  COMMUNITY REFERRALS UPON DISCHARGE:    Home Health:   PT& OT  Agency:[facility] Phone:[phone]   Date of last service:02/07/2017  Medical Equipment/Items Ordered:HAS FROM PREVIOUS ADMITS     GENERAL COMMUNITY RESOURCES FOR PATIENT/FAMILY: Support Groups:[facility] SUPPORT GROUP EVERY SECOND Thursday @ 3:00-4:00 PM ON THE REHAB UNIT QUESTIONS CONTACT [place] [phone]  My questions have been answered and I understand these instructions. I will adhere to these goals and the provided educational materials after my discharge from the hospital.  Patient/Caregiver Signature _______________________________ Date __________  Clinician Signature _______________________________________ Date __________  Please bring this form and your medication list with you to all your follow-up doctor's appointments.

## 2017-02-10 ENCOUNTER — Inpatient Hospital Stay (HOSPITAL_COMMUNITY): Payer: Medicare Other | Admitting: Occupational Therapy

## 2017-02-11 ENCOUNTER — Telehealth: Payer: Self-pay | Admitting: *Deleted

## 2017-02-11 NOTE — Telephone Encounter (Signed)
Denzil MagnusonBen Gentry, PT, [facility] left a message stating that PT evaluation has been completed.  He is requesting home health physical therapy visits with a frequency of 2week3.  Contacted therapist and gave verbal orders per office protocol

## 2017-03-06 ENCOUNTER — Encounter: Payer: Self-pay | Admitting: Physical Medicine & Rehabilitation

## 2017-03-06 ENCOUNTER — Ambulatory Visit (HOSPITAL_BASED_OUTPATIENT_CLINIC_OR_DEPARTMENT_OTHER): Payer: Medicare Other | Admitting: Physical Medicine & Rehabilitation

## 2017-03-06 ENCOUNTER — Encounter: Payer: Medicare Other | Attending: Physical Medicine & Rehabilitation

## 2017-03-06 VITALS — BP 172/73 | HR 78

## 2017-03-06 DIAGNOSIS — Z789 Other specified health status: Secondary | ICD-10-CM | POA: Diagnosis present

## 2017-03-06 DIAGNOSIS — I6381 Other cerebral infarction due to occlusion or stenosis of small artery: Secondary | ICD-10-CM

## 2017-03-06 DIAGNOSIS — I69398 Other sequelae of cerebral infarction: Secondary | ICD-10-CM | POA: Insufficient documentation

## 2017-03-06 DIAGNOSIS — R269 Unspecified abnormalities of gait and mobility: Secondary | ICD-10-CM | POA: Insufficient documentation

## 2017-03-06 DIAGNOSIS — I639 Cerebral infarction, unspecified: Secondary | ICD-10-CM

## 2017-03-06 DIAGNOSIS — M24561 Contracture, right knee: Secondary | ICD-10-CM | POA: Diagnosis not present

## 2017-03-06 NOTE — Progress Notes (Signed)
Subjective:    Patient ID: Andrea [name], female    DOB: 04/25/1925, 83 y.o.   MRN: [id number]  HPI 83 year old right-handed female with history of hypertension, recurrent UTI, hypothyroidism.  Lives with daughter, independent with a cane prior to admission and active.  One-level home.  Presented on January 25, 2017, with left-sided weakness and numbness, blood pressure 209/96.  Troponin negative.  Cranial CT scan negative.  She did not receive tPA.  MRI showed acute right thalamic infarction.  MRA with no large vessel occlusion.  Carotid Dopplers with no ICA stenosis. Echocardiogram with ejection fraction of 65%.  No wall motion abnormalities.  Maintained on aspirin for CVA prophylaxis as well as subcutaneous Lovenox for DVT prophylaxis.  Elevated TSH at 31.569 with free T4 and T3 pending.  She remained on Synthroid, plans to follow up thyroid panel as outpatient with primary care provider. DATE OF ADMISSION:  01/28/2017 DATE OF DISCHARGE:  02/07/2017   Amb with cane after Left hip fracture 08/2014 had ORIF with revision  Mod I dressing and bathing No falls [facility] [place] [facility] there tomorrow Will have thyroid panel in am  Pain Inventory Average Pain 0 Pain Right Now 0 My pain is na  In the last 24 hours, has pain interfered with the following? General activity 0 Relation with others 0 Enjoyment of life 0 What TIME of day is your pain at its worst? na Sleep (in general) Good  Pain is worse with: na Pain improves with: na Relief from Meds: na  Mobility use a walker do you drive?  no  Function retired  Neuro/Psych tingling  Prior Studies Any changes since last visit?  no  Physicians involved in your care Any changes since last visit?  no   Family History  Problem Relation Age of Onset  . CAD Mother [id number]       Died of MI  . Heart failure Father   . CAD Father    Social History   Socioeconomic History  . Marital status: Divorced      Spouse name: Not on file  . Number of children: Not on file  . Years of education: Not on file  . Highest education level: Not on file  Social Needs  . Financial resource strain: Not on file  . Food insecurity - worry: Not on file  . Food insecurity - inability: Not on file  . Transportation needs - medical: Not on file  . Transportation needs - non-medical: Not on file  Occupational History  . Not on file  Tobacco Use  . Smoking status: Never Smoker  . Smokeless tobacco: Never Used  Substance and Sexual Activity  . Alcohol use: No  . Drug use: No  . Sexual activity: Not Currently  Other Topics Concern  . Not on file  Social History Narrative  . Not on file   Past Surgical History:  Procedure Laterality Date  . ABDOMINAL HYSTERECTOMY    . ORIF HIP FRACTURE Left 09/02/2014   Procedure: OPEN REDUCTION INTERNAL FIXATION LEFT HIP FRACTURE;  Surgeon: Darreld McleanWayne Keeling, MD;  Location: [facility];  Service: Orthopedics;  Laterality: Left;   Past Medical History:  Diagnosis Date  . Hypertension   . Thyroid disease    There were no vitals taken for this visit.  Opioid Risk Score:   Fall Risk Score:  `1  Depression screen PHQ 2/9  No flowsheet data found.   Review of Systems  Constitutional: Negative.  HENT: Negative.   Eyes: Negative.   Respiratory: Negative.   Cardiovascular: Negative.   Gastrointestinal: Negative.   Endocrine: Negative.   Genitourinary: Negative.   Musculoskeletal: Negative.   Skin: Negative.   Allergic/Immunologic: Negative.   Neurological: Negative.   Hematological: Negative.   Psychiatric/Behavioral: Negative.   All other systems reviewed and are negative.      Objective:   Physical Exam  Constitutional: She is oriented to person, place, and time. She appears well-developed and well-nourished. No distress.  HENT:  Head: Normocephalic and atraumatic.  Eyes: Conjunctivae and EOM are normal. Pupils are equal, round, and reactive to light.   Neurological: She is alert and oriented to person, place, and time. Gait abnormal.  Skin: Skin is warm and dry. She is not diaphoretic.  Psychiatric: She has a normal mood and affect.  Nursing note and vitals reviewed.  Motor strength is 5/5 bilateral deltoid, bicep, tricep, grip, hip flexor, knee extensor, ankle dorsi flexion plantar flexor Standing balance is fair she has a wide-based support. She ambulates with a walker without evidence of toe drag or knee instability.  Left dysmetria finger-nose-finger Right knee flexion max 90deg  No evidence of dysarthria      Assessment & Plan:  1.  Gait disturbance post stroke, left hemisensory deficits, left upper extremity dysmetria following right thalamic infarct.  She has made a good recovery thus far and is modified independent with a rolling walker but did use a cane prior to her stroke.  In addition she still has significant dysmetria left upper extremity. Recommend outpatient PT OT, [facility] given proximity to patient home  Patient was driving prior to her stroke she has good upper and lower limb function on the right side and intact lower limb function on the left.  Cognitively I think she is able to drive. Discussed with patient and her neighbor who drove her today regarding instructions listed below  Graduated return to driving instructions were provided. It is recommended that the patient first drives with another licensed driver in an empty parking lot. If the patient does well with this, and they can drive on a quiet street with the licensed driver. If the patient does well with this they can drive on a busy street with a licensed driver. If the patient does well with this, the next time out they can go by himself. For the first month after resuming driving, I recommend no nighttime or Interstate driving.

## 2017-03-06 NOTE — Patient Instructions (Signed)

## 2017-03-12 ENCOUNTER — Telehealth (HOSPITAL_COMMUNITY): Payer: Self-pay

## 2017-03-12 ENCOUNTER — Telehealth (HOSPITAL_COMMUNITY): Payer: Self-pay | Admitting: Occupational Therapy

## 2017-03-12 ENCOUNTER — Ambulatory Visit (HOSPITAL_COMMUNITY): Payer: Medicare Other | Admitting: Occupational Therapy

## 2017-03-12 ENCOUNTER — Ambulatory Visit (HOSPITAL_COMMUNITY): Payer: Medicare Other

## 2017-03-12 NOTE — Telephone Encounter (Signed)
Patient call to reschedule her OT and PT appts

## 2017-03-12 NOTE — Telephone Encounter (Signed)
03/12/17  [place] does want to reschedule

## 2017-03-17 ENCOUNTER — Other Ambulatory Visit: Payer: Self-pay

## 2017-03-17 ENCOUNTER — Ambulatory Visit (HOSPITAL_COMMUNITY): Payer: Medicare Other | Admitting: Occupational Therapy

## 2017-03-17 ENCOUNTER — Encounter (HOSPITAL_COMMUNITY): Payer: Self-pay | Admitting: Occupational Therapy

## 2017-03-17 ENCOUNTER — Ambulatory Visit (HOSPITAL_COMMUNITY): Payer: Medicare Other | Attending: Physical Medicine & Rehabilitation

## 2017-03-17 ENCOUNTER — Encounter (HOSPITAL_COMMUNITY): Payer: Self-pay

## 2017-03-17 DIAGNOSIS — R278 Other lack of coordination: Secondary | ICD-10-CM

## 2017-03-17 DIAGNOSIS — R29898 Other symptoms and signs involving the musculoskeletal system: Secondary | ICD-10-CM

## 2017-03-17 DIAGNOSIS — I6381 Other cerebral infarction due to occlusion or stenosis of small artery: Secondary | ICD-10-CM

## 2017-03-17 DIAGNOSIS — M6281 Muscle weakness (generalized): Secondary | ICD-10-CM | POA: Insufficient documentation

## 2017-03-17 DIAGNOSIS — I639 Cerebral infarction, unspecified: Secondary | ICD-10-CM | POA: Diagnosis present

## 2017-03-17 DIAGNOSIS — R2689 Other abnormalities of gait and mobility: Secondary | ICD-10-CM | POA: Diagnosis present

## 2017-03-17 NOTE — Therapy (Signed)
[facility] [address] [place], [place], [address] Phone: [phone]   Fax:  [phone]  Occupational Therapy Evaluation  Patient Details  Name: Andrea [name] Dagher MRN: [id number] Date of Birth: 02/09/1926 No Data Recorded  Encounter Date: 03/17/2017  OT End of Session - 03/17/17 1639    Visit Number  1    Number of Visits  8    Date for OT Re-Evaluation  04/16/17    Authorization Type  Medicare A & B    Authorization Time Period  Before 10th visit    Authorization - Visit Number  1    Authorization - Number of Visits  10    OT Start Time  1607    OT Stop Time  1635    OT Time Calculation (min)  28 min    Activity Tolerance  Patient tolerated treatment well    Behavior During Therapy  [facility] for tasks assessed/performed       Past Medical History:  Diagnosis Date  . Hypertension   . Thyroid disease     Past Surgical History:  Procedure Laterality Date  . ABDOMINAL HYSTERECTOMY    . ORIF HIP FRACTURE Left 09/02/2014   Procedure: OPEN REDUCTION INTERNAL FIXATION LEFT HIP FRACTURE;  Surgeon: Darreld McleanWayne Keeling, MD;  Location: [facility];  Service: Orthopedics;  Laterality: Left;    There were no vitals filed for this visit.  Subjective Assessment - 03/17/17 1637    Subjective   S: I think I'm really doing much better now.     Pertinent History  Pt is an 83 y/o female s/p right CVA on 01/25/17 resulting in left hemiparesis. [facility] and is now independent with ADLs, daughter continuing to stay with pt. Pt was referred to occupational therapy for evaluation and treatment by Dr. Claudette LawsAndrew Kirsteins.     Special Tests  FOTO Score: 55/100 (45% impairment)    Patient Stated Goals  To be stronger in my left arm.     Currently in Pain?  No/denies        [facility] - 03/17/17 1602      Assessment   Medical Diagnosis  ataxia s/p R CVA    Referring Provider  Dr. Claudette LawsAndrew Kirsteins    Onset Date/Surgical Date  01/25/17    Hand Dominance  Right    Next MD Visit  04/03/2017 approximately    Prior Therapy  [facility] 01/29/2017-02/07/2017      Precautions   Precautions  None      Restrictions   Weight Bearing Restrictions  No      Balance Screen   Has the patient fallen in the past 6 months  No    Has the patient had a decrease in activity level because of a fear of falling?   No    Is the patient reluctant to leave their home because of a fear of falling?   No      Home  Environment   Family/patient expects to be discharged to:  Private residence    Living Arrangements  Children    Lives With  Daughter      Prior Function   Level of Independence  Independent with basic ADLs    Vocation  Retired    Leisure  garden, crochet, patient is driving and cleared by MD      ADL   ADL comments  Pt is having difficulty curling hair, working buttons and zippers, tying  shoes      Written Expression   Dominant Hand  Right      Coordination   Gross Motor Movements are Fluid and Coordinated  Yes    Fine Motor Movements are Fluid and Coordinated  No    9 Hole Peg Test  Left;Right    Right 9 Hole Peg Test  31.97"    Left 9 Hole Peg Test  45.90"      ROM / Strength   AROM / PROM / Strength  Strength      Strength   Strength Assessment Site  Hand;Shoulder;Elbow;Wrist;Forearm    Right/Left Shoulder  Left    Left Shoulder Flexion  4/5    Left Shoulder ABduction  4/5    Left Shoulder Internal Rotation  4-/5    Left Shoulder External Rotation  4-/5    Right/Left Elbow  Left    Left Elbow Flexion  4-/5    Left Elbow Extension  4/5    Right/Left Forearm  Left    Left Forearm Pronation  4+/5    Left Forearm Supination  4+/5    Right/Left Wrist  Left    Left Wrist Flexion  4+/5    Left Wrist Extension  4+/5    Left Wrist Radial Deviation  4+/5    Left Wrist Ulnar Deviation  4+/5    Right/Left hand  Right;Left    Right Hand Gross Grasp  Functional    Right Hand Grip (lbs)  38    Right Hand Lateral Pinch  8 lbs     Right Hand 3 Point Pinch  6 lbs    Left Hand Gross Grasp  Functional    Left Hand Grip (lbs)  22    Left Hand Lateral Pinch  7 lbs    Left Hand 3 Point Pinch  7 lbs                      OT Education - 03/17/17 1626    Education provided  Yes    Education Details  red theraputty exercises    Person(s) Educated  Patient    Methods  Explanation;Demonstration;Handout    Comprehension  Verbalized understanding;Returned demonstration       OT Short Term Goals - 03/17/17 1643      OT SHORT TERM GOAL #1   Title  Pt will be educated on and independent in HEP to improve use of LUE as non-dominant during B/IADL completion.     Time  4    Period  Weeks    Status  New    Target Date  04/16/17      OT SHORT TERM GOAL #2   Title  Pt will improve LUE strength to 5/5 to improve ability to perform gardening tasks.     Time  4    Period  Weeks    Status  New      OT SHORT TERM GOAL #3   Title  Pt will improve left grip strength by 10# to improve ability to grasp and maintain hold on weighted objects.     Time  4    Period  Weeks    Status  New      OT SHORT TERM GOAL #4   Title  Pt will improve coordination by completing 9 hole peg test in 38" or less to improve ability to curl hair.    Time  4    Period  Weeks    Status  New      OT SHORT TERM GOAL #5   Title  Pt will return to highest level of functioning using LUE as non-dominant during functional task completion.     Time  4    Period  Weeks    Status  New               Plan - 03/17/17 1639    Clinical Impression Statement  A: Pt is an 83 y/o female presenting with left hemiparesis s/p CVA impacting her ability to use LUE as non-dominant during daily tasks. Pt reports improvement since beginning therapy at [facility], however continues to have difficulty with daily tasks. Provided red theraputty for grip strengthening HEP today.     Occupational Profile and client history currently impacting functional  performance  Pt was independent prior to CVA and is motivated to regain and maintain independence in B/IADLs.     Occupational performance deficits (Please refer to evaluation for details):  ADL's;IADL's;Leisure;Social Participation    Rehab Potential  Good    OT Frequency  2x / week    OT Duration  4 weeks    OT Treatment/Interventions  Self-care/ADL training;Patient/family education;Passive range of motion;Electrical Stimulation;Moist Heat;Therapeutic exercise;Therapeutic activities    Plan  P: Pt will benefit from skilled OT services to improve LUE strength, grip and pinch strength, fine motor coordination, and functional use of LUE during daily tasks. Treatment plan: LUE strengthening, grip and pinch strengthening, coordination tasks, functional reaching with LUE    Clinical Decision Making  Limited treatment options, no task modification necessary    OT Home Exercise Plan  12/17: red theraputty    Consulted and Agree with Plan of Care  Patient       Patient will benefit from skilled therapeutic intervention in order to improve the following deficits and impairments:  Decreased strength, Decreased activity tolerance, Impaired UE functional use, Decreased coordination  Visit Diagnosis: Other lack of coordination  Other symptoms and signs involving the musculoskeletal system  Muscle weakness (generalized)  G-Codes - 03/17/17 1707    Functional Assessment Tool Used (Outpatient only)  clinical judgement    Functional Limitation  Carrying, moving and handling objects    Carrying, Moving and Handling Objects Current Status (W0981(G8984)  At least 20 percent but less than 40 percent impaired, limited or restricted    Carrying, Moving and Handling Objects Goal Status (X9147(G8985)  At least 1 percent but less than 20 percent impaired, limited or restricted       Problem List Patient Active Problem List   Diagnosis Date Noted  . Contracture of right knee 03/06/2017  . Gait disturbance, post-stroke  03/06/2017  . Right thalamic infarction (HCC) 01/28/2017  . Thalamic stroke (HCC)   . TIA (transient ischemic attack) 01/25/2017  . Hypernatremia 01/25/2017  . CVA (cerebral vascular accident) (HCC) 01/25/2017  . Intertrochanteric fracture of left hip (HCC) 09/01/2014  . Hypothyroidism 09/01/2014  . Hypertension 09/01/2014  . Closed left hip fracture [facility](HCC) 09/01/2014   Ezra SitesLeslie Mikeila Burgen, OTR/L  [phone] 03/17/2017, 5:07 PM  [facility] [address] [place], [place], [address] Phone: [phone]   Fax:  [phone]  Name: Andrea [name] Tout MRN: [id number] Date of Birth: 07/22/1925

## 2017-03-17 NOTE — Therapy (Signed)
[facility] [address] [place], [place], [address] Phone: [phone]   Fax:  [phone]  Physical Therapy Evaluation  Patient Details  Name: Andrea [name] MRN: [id number] Date of Birth: 03/10/1926 Referring Provider: Dr. Claudette LawsAndrew Kirsteins   Encounter Date: 03/17/2017  PT End of Session - 03/17/17 1527    Visit Number  1    Number of Visits  17    Date for PT Re-Evaluation  04/14/17    Authorization Type  Medicare Part A/B    Authorization Time Period  03/17/17 - 05/12/17    Authorization - Visit Number  1    Authorization - Number of Visits  10    PT Start Time  1522    PT Stop Time  1608    PT Time Calculation (min)  46 min    Activity Tolerance  Patient tolerated treatment well    Behavior During Therapy  [facility] for tasks assessed/performed       Past Medical History:  Diagnosis Date  . Hypertension   . Thyroid disease     Past Surgical History:  Procedure Laterality Date  . ABDOMINAL HYSTERECTOMY    . ORIF HIP FRACTURE Left 09/02/2014   Procedure: OPEN REDUCTION INTERNAL FIXATION LEFT HIP FRACTURE;  Surgeon: Darreld McleanWayne Keeling, MD;  Location: [facility];  Service: Orthopedics;  Laterality: Left;    There were no vitals filed for this visit.   Subjective Assessment - 03/17/17 1527    Subjective  Patient reports she was independent with single point cane prior to her stroke and was driving and performing all ADL's with no difficulty. She reports her stroke occurred a few days before Halloween and that [facility] and then [facility] for 2 weeks of intense therapy. When she was discharged home she received HHPT for 4-5 weeks and stated the therapist would come 2x/week. She has been cleared to drive by her MD after her last follow up appointment and is very excited for this because she has always loved to drive. She reports a positive attitude towards therapy and has a supportive daughter who  she lives with.    Pertinent History  left hip fracture with intramedullary nail approximately 2 years ago    How long can you sit comfortably?  unlimited    How long can you stand comfortably?  several minutes but hasn't really pushed to see how long    How long can you walk comfortably?  several minutes, unsure, hasn't pushed it    Patient Stated Goals  Get stronger and improve walking to prior ability and work back towards cane    Currently in Pain?  No/denies         [facility] PT Assessment - 03/17/17 0001      Assessment   Medical Diagnosis  ataxia s/p R CVA    Referring Provider  Dr. Claudette LawsAndrew Kirsteins    Onset Date/Surgical Date  01/25/17    Hand Dominance  Right    Next MD Visit  04/03/2017 approxiamtely    Prior Therapy  [facility] 01/29/2017-02/07/2017      Precautions   Precautions  None      Restrictions   Weight Bearing Restrictions  No      Balance Screen   Has the patient fallen in the past 6 months  No    Has the patient had a decrease in activity level because of a fear of falling?  No    Is the patient reluctant to leave their home because of a fear of falling?   No      Home Environment   Living Environment  Private residence    Living Arrangements  Children daughter    Available Help at Discharge  Family    Type of Home  Mobile home 14 x 70,     Home Access  Ramped entrance    Home Layout  One level    Home Equipment  Walker - 2 wheels;Cane - single point    Additional Comments  lots of cats but outdoor cats      Prior Function   Level of Independence  Independent with household mobility with device;Independent with basic ADLs    Vocation  Retired    Leisure  garden, crochet, pateint is driving and cleared by MD      Cognition   Overall Cognitive Status  Within Functional Limits for tasks assessed      Coordination   Heel Shin Test  pateint limited by hip movement      Strength   Right Hip Flexion  4+/5    Left Hip Flexion  4+/5    Right Knee Flexion  4+/5     Right Knee Extension  4+/5    Left Knee Flexion  4+/5    Left Knee Extension  4/5    Right Ankle Dorsiflexion  4+/5    Left Ankle Dorsiflexion  4/5      Ambulation/Gait   Ambulation/Gait  Yes    Ambulation Distance (Feet)  256 Feet    Assistive device  Rolling walker    Gait Pattern  Step-through pattern;Decreased step length - left;Decreased stride length;Decreased hip/knee flexion - left;Decreased dorsiflexion - left;Wide base of support;Trunk flexed;Poor foot clearance - left    Ambulation Surface  Level    Gait velocity  0.42 m/s    Gait Comments  3 minute walk test at comfortable pace      Standardized Balance Assessment   Standardized Balance Assessment  Timed Up and Go Test;Five Times Sit to Stand    Five times sit to stand comments   22.05 seconds      Timed Up and Go Test   TUG  Normal TUG    Normal TUG (seconds)  25.9       Objective measurements completed on examination: See above findings.     PT Education - 03/17/17 1835    Education provided  Yes    Education Details  Educated on balance findings and POC.    Person(s) Educated  Patient    Methods  Explanation    Comprehension  Verbalized understanding       PT Short Term Goals - 03/17/17 1848      PT SHORT TERM GOAL #1   Title  Pateint will improve MMT by 1/2 grade for all groups tested to improve functional strength for improved gait.    Time  4    Status  New    Target Date  04/14/17      PT SHORT TERM GOAL #2   Title  pateitn will be independent with HEP and be able to demonstrate proper technique/form wtih exercises.    Time  4    Period  Weeks    Status  New      PT SHORT TERM GOAL #3   Title  Patient will improve 5x sit to stand time by 5 seconds to demonstrate significant change  in BLE strength.    Time  4    Status  New        PT Long Term Goals - April 04, 2017 1842      PT LONG TERM GOAL #1   Title  Pt to be able to walk without an assistive device to be able to carry items in one  hand in her home to complete ADL's.    Time  8    Period  Weeks    Status  New    Target Date  05/12/17      PT LONG TERM GOAL #2   Title  Pt strength will improve by 1 grade for all muscle groups tested to be able to perform functional activities cooking and grocery shopping.    Time  8    Period  Weeks    Status  New      PT LONG TERM GOAL #3   Title  Patient will improve TUG  5 seconds while performing with SPC to decrease fall risk while ambulating.    Time  8    Period  Weeks    Status  New        Plan - 04/04/2017 1836    Clinical Impression Statement  Patient presents today for outpatient PT evaluation following R CVA on January 25, 2017. She currently is functioning below her baseline prior to her stroke of independent with a single point cane for ambulation. She is now limited by left lower extremity weakness and decreased coordination, and standardized outcome measures found she is at an increased fall risk. Ms. Escajeda will benefit from skilled PT services to address current impairments and progress towards goals to increase functional independence.    Clinical Presentation  Stable    Clinical Presentation due to:  impaired balance, gait, muscle weakness, decreased coordination, s/p R CVA    Clinical Decision Making  Low    Rehab Potential  Good    PT Frequency  2x / week    PT Duration  8 weeks    PT Treatment/Interventions  ADLs/Self Care Home Management;Electrical Stimulation;DME Instruction;Gait training;Stair training;Functional mobility training;Therapeutic activities;Therapeutic exercise;Balance training;Neuromuscular re-education;Patient/family education;Manual techniques;Passive range of motion;Energy conservation;Taping    PT Next Visit Plan  Review eval and goals and initiate balance/gait training with safe use of RW, begin LLE strangthening.     PT Home Exercise Plan  Initiate first treatment session.    Consulted and Agree with Plan of Care  Patient        Patient will benefit from skilled therapeutic intervention in order to improve the following deficits and impairments:  Abnormal gait, Improper body mechanics, Decreased coordination, Decreased mobility, Decreased activity tolerance, Decreased endurance, Decreased strength, Decreased balance, Decreased safety awareness, Difficulty walking  Visit Diagnosis: Other abnormalities of gait and mobility  Muscle weakness (generalized)  Thalamic stroke (HCC)  Poor balance  G-Codes - 2017-04-04 1850    Functional Assessment Tool Used (Outpatient Only)  FOTO, clinical judgement    Functional Limitation  Mobility: Walking and moving around    Mobility: Walking and Moving Around Current Status (U4403)  At least 40 percent but less than 60 percent impaired, limited or restricted    Mobility: Walking and Moving Around Goal Status [phone])  At least 40 percent but less than 60 percent impaired, limited or restricted        Problem List Patient Active Problem List   Diagnosis Date Noted  . Contracture of right knee 03/06/2017  . Gait  disturbance, post-stroke 03/06/2017  . Right thalamic infarction (HCC) 01/28/2017  . Thalamic stroke (HCC)   . TIA (transient ischemic attack) 01/25/2017  . Hypernatremia 01/25/2017  . CVA (cerebral vascular accident) (HCC) 01/25/2017  . Intertrochanteric fracture of left hip (HCC) 09/01/2014  . Hypothyroidism 09/01/2014  . Hypertension 09/01/2014  . Closed left hip fracture (HCC) 09/01/2014    Valentino Saxon, PT, DPT Physical Therapist with [facility]  03/17/2017 6:52 PM    [facility] [address] [place], [place], [address] Phone: [phone]   Fax:  [phone]  Name: Andrea [name] MRN: [id number] Date of Birth: October 19, 1925

## 2017-03-17 NOTE — Patient Instructions (Signed)
Home Exercises Program Theraputty Exercises  Do the following exercises 3 times a day using your affected hand.  1. Roll putty into a ball.  2. Make into a pancake.  3. Roll putty into a roll.  4. Pinch along log with first finger and thumb.   5. Make into a ball.  6. Roll it back into a log.   7. Pinch using thumb and side of first finger.  8. Roll into a ball, then flatten into a pancake.

## 2017-03-20 ENCOUNTER — Encounter (HOSPITAL_COMMUNITY): Payer: Self-pay

## 2017-03-20 ENCOUNTER — Other Ambulatory Visit: Payer: Self-pay

## 2017-03-20 ENCOUNTER — Ambulatory Visit (HOSPITAL_COMMUNITY): Payer: Medicare Other

## 2017-03-20 DIAGNOSIS — R2689 Other abnormalities of gait and mobility: Secondary | ICD-10-CM | POA: Diagnosis not present

## 2017-03-20 DIAGNOSIS — I6381 Other cerebral infarction due to occlusion or stenosis of small artery: Secondary | ICD-10-CM

## 2017-03-20 DIAGNOSIS — I639 Cerebral infarction, unspecified: Secondary | ICD-10-CM

## 2017-03-20 DIAGNOSIS — M6281 Muscle weakness (generalized): Secondary | ICD-10-CM

## 2017-03-20 NOTE — Therapy (Addendum)
[facility] [address] [place], [place], [address] Phone: [phone]   Fax:  [phone]  Physical Therapy Treatment  Patient Details  Name: Andrea Hatchetancy Salton MRN: [id number] Date of Birth: 02/25/1926 Referring Provider: Dr. Claudette LawsAndrew Kirsteins   Encounter Date: 03/20/2017  PT End of Session - 03/20/17 0810    Visit Number  2    Number of Visits  17    Date for PT Re-Evaluation  04/14/17    Authorization Type  Medicare Part A/B    Authorization Time Period  03/17/17 - 05/12/17    Authorization - Visit Number  2    Authorization - Number of Visits  10    PT Start Time  0816    PT Stop Time  0901    PT Time Calculation (min)  45 min    Equipment Utilized During Treatment  Gait belt    Activity Tolerance  Patient tolerated treatment well    Behavior During Therapy  [facility] for tasks assessed/performed       Past Medical History:  Diagnosis Date  . Hypertension   . Thyroid disease     Past Surgical History:  Procedure Laterality Date  . ABDOMINAL HYSTERECTOMY    . ORIF HIP FRACTURE Left 09/02/2014   Procedure: OPEN REDUCTION INTERNAL FIXATION LEFT HIP FRACTURE;  Surgeon: Darreld McleanWayne Keeling, MD;  Location: [facility];  Service: Orthopedics;  Laterality: Left;    There were no vitals filed for this visit.  Subjective Assessment - 03/20/17 0809    Subjective  Patient reports she feels "stiff" today in her hips and knees but that is not unusual for her. She denies and pain and doesn't rate her "stiffness". She is happy with her goals to progress her independence walking. Overall she staes she feels tired and reports she did not sleep well last night because she was worried about missing her morning appointment.    Pertinent History  left hip fracture with intramedullary nail approximately 2 years ago    How long can you sit comfortably?  --    How long can you stand comfortably?  --    How long can you walk comfortably?  --    Patient Stated Goals   Get stronger and improve walking to prior ability and work back towards cane    Currently in Pain?  No/denies       Outcome measures/survey:  FOTO: 45% limitation    [facility] Adult PT Treatment/Exercise - 03/20/17 0001      Ambulation/Gait   Ambulation/Gait  Yes    Assistive device  Rolling walker    Gait Pattern  Step-through pattern;Decreased step length - left;Decreased stride length;Decreased hip/knee flexion - left;Decreased dorsiflexion - left;Wide base of support;Trunk flexed;Poor foot clearance - left    Ambulation Surface  Level    Gait Comments  cues for upright posture and safe proximity to RW to deter forward tunk lean and facilitate activation of hip extensors      Exercises   Exercises  Knee/Hip      Knee/Hip Exercises: Standing   Lateral Step Up  Both;15 reps;Hand Hold: 2;Step Height: 4"    Forward Step Up  15 reps;Both;Hand Hold: 2;Step Height: 4"      Knee/Hip Exercises: Supine   Bridges  Strengthening;Both;2 sets;10 reps;Limitations    Bridges Limitations  red theraband for abduction on second set      Knee/Hip Exercises: Sidelying   Clams  clamshell with red theraband, 1x 15 reps  BLE      Balance Exercises - 03/20/17 0845      Balance Exercises: Standing   Standing Eyes Opened  Narrow base of support (BOS);Foam/compliant surface;3 reps;30 secs no UE support    SLS  --    SLS with Vectors  Upper extremity assist 2;5 reps;Limitations;Solid surface 3 cones taps BLE    Sidestepping  5 reps;Limitations in // bars, no band with BUE support       PT Short Term Goals - 03/17/17 1848      PT SHORT TERM GOAL #1   Title  Pateint will improve MMT by 1/2 grade for all groups tested to improve functional strength for improved gait.    Time  4    Status  New    Target Date  04/14/17      PT SHORT TERM GOAL #2   Title  pateitn will be independent with HEP and be able to demonstrate proper technique/form wtih exercises.    Time  4    Period  Weeks    Status  New       PT SHORT TERM GOAL #3   Title  Patient will improve 5x sit to stand time by 5 seconds to demonstrate significant change in BLE strength.    Time  4    Status  New        PT Long Term Goals - 03/17/17 1842      PT LONG TERM GOAL #1   Title  Pt to be able to walk without an assistive device to be able to carry items in one hand in her home to complete ADL's.    Time  8    Period  Weeks    Status  New    Target Date  05/12/17      PT LONG TERM GOAL #2   Title  Pt strength will improve by 1 grade for all muscle groups tested to be able to perform functional activities cooking and grocery shopping.    Time  8    Period  Weeks    Status  New      PT LONG TERM GOAL #3   Title  Patient will improve TUG  5 seconds while performing with SPC to decrease fall risk while ambulating.    Time  8    Period  Weeks    Status  New       Plan - 03/20/17 0810    Clinical Impression Statement  Patient initiated first therapy treatment session today and began gait traiign with rolling walker and bilatearl LE strengthenign as well as balance training. She has difficulty maintaining upright posture durign ambulation likely due to weak hip extensors. Ms. Andrea [name] was slightly limited today by fatigue as she reported difficulty sleeping las tnight. She will benefit from continued skilled PT interventions to address impairments and advance functional strengthening and balance activities to improve independence wtih mobility.    Rehab Potential  Good    PT Frequency  2x / week    PT Duration  8 weeks    PT Treatment/Interventions  ADLs/Self Care Home Management;Electrical Stimulation;DME Instruction;Gait training;Stair training;Functional mobility training;Therapeutic activities;Therapeutic exercise;Balance training;Neuromuscular re-education;Patient/family education;Manual techniques;Passive range of motion;Energy conservation;Taping    PT Next Visit Plan  Continue with gait training with safe  proximity with RW and cues for posture. Advacne functional strengthening and balacne activities with stepping over/around obstacles, and SLS training to progress to [facility] for ambulation.    PT Home  Exercise Plan  12/20 - bridge and clamshell wtih red TB, side stepping at counter    Consulted and Agree with Plan of Care  Patient       Patient will benefit from skilled therapeutic intervention in order to improve the following deficits and impairments:  Abnormal gait, Improper body mechanics, Decreased coordination, Decreased mobility, Decreased activity tolerance, Decreased endurance, Decreased strength, Decreased balance, Decreased safety awareness, Difficulty walking  Visit Diagnosis: Other abnormalities of gait and mobility  Muscle weakness (generalized)  Thalamic stroke (HCC)  Poor balance     Problem List Patient Active Problem List   Diagnosis Date Noted  . Contracture of right knee 03/06/2017  . Gait disturbance, post-stroke 03/06/2017  . Right thalamic infarction (HCC) 01/28/2017  . Thalamic stroke (HCC)   . TIA (transient ischemic attack) 01/25/2017  . Hypernatremia 01/25/2017  . CVA (cerebral vascular accident) (HCC) 01/25/2017  . Intertrochanteric fracture of left hip (HCC) 09/01/2014  . Hypothyroidism 09/01/2014  . Hypertension 09/01/2014  . Closed left hip fracture (HCC) 09/01/2014     Valentino Saxon, PT, DPT Physical Therapist with [facility]  03/20/2017 10:41 AM    [facility] [address] [place], [place], [address] Phone: [phone]   Fax:  [phone]  Name: Andrea [name] MRN: [id number] Date of Birth: 07-18-1925

## 2017-03-20 NOTE — Patient Instructions (Signed)
   ELASTIC BAND - SIDELYING CLAM - CLAMSHELL: 1-2 sets of 10-15 repetitions   While lying on your side with your knees bent and an elastic band wrapped around your knees, draw up the top knee while keeping contact of your feet together as shown.   Do not let your pelvis roll back during the lifting movement.       BRIDGING ELASTIC BAND ABDUCTION: 1-2 sets of 10-15 repetitions  While lying on your back, place an elastic band around your knees and pull your knees apart. Hold this and then tighten your lower abdominals, squeeze your buttocks and raise your buttocks off the floor/bed as creating a "Bridge" with your body.     Side stepping (no theraband): 3-5 times both directions along kitchen counter  Place hands on kitchen counter for balance. Side step to the right 3 to 5 steps. Side step to the left 23 to 5 steps.  Repeat 2 sets until fatigued.  Perform with daughter present

## 2017-03-27 ENCOUNTER — Encounter (HOSPITAL_COMMUNITY): Payer: Self-pay | Admitting: Specialist

## 2017-03-27 ENCOUNTER — Ambulatory Visit (HOSPITAL_COMMUNITY): Payer: Medicare Other

## 2017-03-27 ENCOUNTER — Ambulatory Visit (HOSPITAL_COMMUNITY): Payer: Medicare Other | Admitting: Specialist

## 2017-03-27 DIAGNOSIS — I6381 Other cerebral infarction due to occlusion or stenosis of small artery: Secondary | ICD-10-CM

## 2017-03-27 DIAGNOSIS — R29898 Other symptoms and signs involving the musculoskeletal system: Secondary | ICD-10-CM

## 2017-03-27 DIAGNOSIS — I639 Cerebral infarction, unspecified: Secondary | ICD-10-CM

## 2017-03-27 DIAGNOSIS — R2689 Other abnormalities of gait and mobility: Secondary | ICD-10-CM | POA: Diagnosis not present

## 2017-03-27 DIAGNOSIS — M6281 Muscle weakness (generalized): Secondary | ICD-10-CM

## 2017-03-27 DIAGNOSIS — R278 Other lack of coordination: Secondary | ICD-10-CM

## 2017-03-27 NOTE — Therapy (Signed)
[facility] [address] [place], [place], [address] Phone: [phone]   Fax:  [phone]  Occupational Therapy Treatment  Patient Details  Name: Andrea [name] Konkel MRN: [id number] Date of Birth: 11/01/1925 No Data Recorded  Encounter Date: 03/27/2017  OT End of Session - 03/27/17 1643    Visit Number  2    Number of Visits  8    Date for OT Re-Evaluation  04/16/17    Authorization Type  Medicare A & B    Authorization Time Period  Before 10th visit    Authorization - Visit Number  2    Authorization - Number of Visits  10    OT Start Time  1515    OT Stop Time  1600    OT Time Calculation (min)  45 min    Activity Tolerance  Patient tolerated treatment well    Behavior During Therapy  [facility] for tasks assessed/performed       Past Medical History:  Diagnosis Date  . Hypertension   . Thyroid disease     Past Surgical History:  Procedure Laterality Date  . ABDOMINAL HYSTERECTOMY    . ORIF HIP FRACTURE Left 09/02/2014   Procedure: OPEN REDUCTION INTERNAL FIXATION LEFT HIP FRACTURE;  Surgeon: Darreld McleanWayne Keeling, MD;  Location: [facility];  Service: Orthopedics;  Laterality: Left;    There were no vitals filed for this visit.  Subjective Assessment - 03/27/17 1643    Subjective   S:  I have been working on my hand at home .    Currently in Pain?  No/denies    Pain Score  0-No pain         [facility] OT Assessment - 03/27/17 0001      Assessment   Medical Diagnosis  S/P R CVA with LUE weakness               OT Treatments/Exercises (OP) - 03/27/17 0001      Exercises   Exercises  Shoulder;Hand      Shoulder Exercises: Seated   Elevation  AROM;10 reps    Extension  AROM;10 reps    Row  AROM;10 reps      Shoulder Exercises: ROM/Strengthening   Proximal Shoulder Strengthening, Seated  10 times each      Hand Exercises   Theraputty  Flatten;Roll;Grip;Locate Pegs    Theraputty - Flatten  pink standing    Theraputty - Roll   pink seated    Theraputty - Grip  pink supinated and pronated grip    Theraputty - Locate Pegs  10     Hand Gripper with Large Beads  29#, moderate difficulty picking up 4 large beads     Sponges  11, 12, 13             OT Education - 03/27/17 1643    Education provided  Yes    Education Details  reviewed POC and issued a written copy    Person(s) Educated  Patient    Methods  Explanation;Handout    Comprehension  Verbalized understanding;Returned demonstration       OT Short Term Goals - 03/27/17 1648      OT SHORT TERM GOAL #1   Title  Pt will be educated on and independent in HEP to improve use of LUE as non-dominant during B/IADL completion.     Time  4    Period  Weeks    Status  On-going      OT  SHORT TERM GOAL #2   Title  Pt will improve LUE strength to 5/5 to improve ability to perform gardening tasks.     Time  4    Period  Weeks    Status  On-going      OT SHORT TERM GOAL #3   Title  Pt will improve left grip strength by 10# to improve ability to grasp and maintain hold on weighted objects.     Time  4    Period  Weeks    Status  On-going      OT SHORT TERM GOAL #4   Title  Pt will improve coordination by completing 9 hole peg test in 38" or less to improve ability to curl hair.    Time  4    Period  Weeks    Status  On-going      OT SHORT TERM GOAL #5   Title  Pt will return to highest level of functioning using LUE as non-dominant during functional task completion.     Time  4    Period  Weeks    Status  On-going               Plan - 03/27/17 1644    Clinical Impression Statement  A:  Began therapeutic activities to improve proximal shoulder strength, grip strength, and pinch strength in left hand.  occassional verbal guidance required for technique.    Plan  P:  Increase resistance with hand gripper.  increase to 15 sponges that patient picks up as her strength improves.     Consulted and Agree with Plan of Care  Patient        Patient will benefit from skilled therapeutic intervention in order to improve the following deficits and impairments:  Decreased strength, Decreased activity tolerance, Impaired UE functional use, Decreased coordination  Visit Diagnosis: Other symptoms and signs involving the musculoskeletal system  Other lack of coordination    Problem List Patient Active Problem List   Diagnosis Date Noted  . Contracture of right knee 03/06/2017  . Gait disturbance, post-stroke 03/06/2017  . Right thalamic infarction (HCC) 01/28/2017  . Thalamic stroke (HCC)   . TIA (transient ischemic attack) 01/25/2017  . Hypernatremia 01/25/2017  . CVA (cerebral vascular accident) (HCC) 01/25/2017  . Intertrochanteric fracture of left hip (HCC) 09/01/2014  . Hypothyroidism 09/01/2014  . Hypertension 09/01/2014  . Closed left hip fracture [facility](HCC) 09/01/2014    Shirlean MylarBethany H. Kennedey [place], [place]HA, OTR/L [phone]  03/27/2017, 4:52 PM  [facility] [address] [place], [place], [address] Phone: [phone]   Fax:  [phone]  Name: Andrea [name] MRN: [id number] Date of Birth: 11/20/1925

## 2017-03-27 NOTE — Therapy (Signed)
[facility] [address] [place], [place], [address] Phone: [phone]   Fax:  [phone]  Patient Details  Name: Andrea [name] MRN: [id number] Date of Birth: 04/07/1925 Referring Provider:  [facility]  Encounter Date: 03/27/2017  Pt completed OT session then left prior PT session.   [address]Casey Cockerham, LPTA; CBIS [phone]  Juel BurrowCockerham, Casey Jo 03/27/2017, 4:06 PM  [place] [facility] [address] [place], [place], [address] Phone: [phone]   Fax:  [phone]

## 2017-03-31 ENCOUNTER — Telehealth (HOSPITAL_COMMUNITY): Payer: Self-pay

## 2017-03-31 NOTE — Telephone Encounter (Signed)
Patient have other obligations on 04-02-17 and will see us on 04-04-17

## 2017-04-02 ENCOUNTER — Ambulatory Visit (HOSPITAL_COMMUNITY): Payer: Medicare Other

## 2017-04-04 ENCOUNTER — Other Ambulatory Visit: Payer: Self-pay

## 2017-04-04 ENCOUNTER — Ambulatory Visit (HOSPITAL_COMMUNITY): Payer: Medicare Other | Admitting: Occupational Therapy

## 2017-04-04 ENCOUNTER — Encounter (HOSPITAL_COMMUNITY): Payer: Self-pay | Admitting: Occupational Therapy

## 2017-04-04 ENCOUNTER — Ambulatory Visit (HOSPITAL_COMMUNITY): Payer: Medicare Other | Attending: Physical Medicine & Rehabilitation

## 2017-04-04 DIAGNOSIS — R29898 Other symptoms and signs involving the musculoskeletal system: Secondary | ICD-10-CM

## 2017-04-04 DIAGNOSIS — R278 Other lack of coordination: Secondary | ICD-10-CM

## 2017-04-04 DIAGNOSIS — I639 Cerebral infarction, unspecified: Secondary | ICD-10-CM | POA: Diagnosis present

## 2017-04-04 DIAGNOSIS — M6281 Muscle weakness (generalized): Secondary | ICD-10-CM | POA: Diagnosis present

## 2017-04-04 DIAGNOSIS — R2689 Other abnormalities of gait and mobility: Secondary | ICD-10-CM | POA: Insufficient documentation

## 2017-04-04 NOTE — Therapy (Signed)
[facility] [address] [place], [place], [address] Phone: [phone]   Fax:  43[phone]  Physical Therapy Treatment  Patient Details  Name: Andrea [name] MRN: [id number] Date of Birth: 09/07/1925 Referring Provider: Dr. Claudette LawsAndrew Kirsteins   Encounter Date: 04/04/2017  PT End of Session - 04/04/17 1044    Visit Number  3    Number of Visits  17    Date for PT Re-Evaluation  04/14/17    Authorization Type  Medicare Part A/B    Authorization Time Period  03/17/17 - 05/12/17    Authorization - Visit Number  3    Authorization - Number of Visits  10    PT Start Time  1031    PT Stop Time  1110    PT Time Calculation (min)  39 min    Activity Tolerance  Patient tolerated treatment well;No increased pain    Behavior During Therapy  WFL for tasks assessed/performed       Past Medical History:  Diagnosis Date  . Hypertension   . Thyroid disease     Past Surgical History:  Procedure Laterality Date  . ABDOMINAL HYSTERECTOMY    . ORIF HIP FRACTURE Left 09/02/2014   Procedure: OPEN REDUCTION INTERNAL FIXATION LEFT HIP FRACTURE;  Surgeon: Darreld McleanWayne Keeling, MD;  Location: [facility];  Service: Orthopedics;  Laterality: Left;    There were no vitals filed for this visit.  Subjective Assessment - 04/04/17 1033    Subjective  Pt reports she is doing well. She is sleepy, reports she does not sleep well when she has to get up for earlyappointments. Her HEP compliance is good, but she continues to have stiffness.     Pertinent History  left hip fracture with intramedullary nail approximately 2 years ago    Currently in Pain?  No/denies                      [facility]/Exercise - 04/04/17 1039      Ambulation/Gait   Ambulation/Gait  Yes    Ambulation/Gait Assistance  5: Supervision    Ambulation/Gait Assistance Details  [phone]    Assistive device  Rolling walker    Gait velocity  0.4239m/s first lap    Pre-Gait  Activities  4-square: 5RT, minGuaqrd Assist, no UE support lateral side step 1x1520ft bilat; retroAMB 1x7620ft, no UE suppo      Knee/Hip Exercises: Seated   Long Arc Quad  Both;1 set;15 reps to address OA related stiffness prior to functional exercise    Other Seated Knee/Hip Exercises  Seated Heel Raises: 2x20 Seated dorsiflexion: 2x20    Other Seated Knee/Hip Exercises  seated reciprocal marching: 2x20 Seated LAQ: 2x10 bilat    Sit to [employer] CorporationSand  2 sets 2x8, hands-free, Chair + airex               PT Short Term Goals - 03/17/17 1848      PT SHORT TERM GOAL #1   Title  Pateint will improve MMT by 1/2 grade for all groups tested to improve functional strength for improved gait.    Time  4    Status  New    Target Date  04/14/17      PT SHORT TERM GOAL #2   Title  pateitn will be independent with HEP and be able to demonstrate proper technique/form wtih exercises.    Time  4    Period  Weeks  Status  New      PT SHORT TERM GOAL #3   Title  Patient will improve 5x sit to stand time by 5 seconds to demonstrate significant change in BLE strength.    Time  4    Status  New        PT Long Term Goals - 03/17/17 1842      PT LONG TERM GOAL #1   Title  Pt to be able to walk without an assistive device to be able to carry items in one hand in her home to complete ADL's.    Time  8    Period  Weeks    Status  New    Target Date  05/12/17      PT LONG TERM GOAL #2   Title  Pt strength will improve by 1 grade for all muscle groups tested to be able to perform functional activities cooking and grocery shopping.    Time  8    Period  Weeks    Status  New      PT LONG TERM GOAL #3   Title  Patient will improve TUG  5 seconds while performing with SPC to decrease fall risk while ambulating.    Time  8    Period  Weeks    Status  New            Plan - 04/04/17 1045    Clinical Impression Statement  Continued with high rep A/ROM to facilitate neuromotor activation and OA  related stiffness. Pt tolerating session well, continues to make progress overall toward goals. Pt remains highly motivated to return to outdfoor AMB. STS remains veyr limited d/t global weakness, but also by chronic RT knee OA.     Rehab Potential  Good    PT Frequency  2x / week    PT Duration  8 weeks    PT Treatment/Interventions  ADLs/Self Care Home Management;Electrical Stimulation;DME Instruction;Gait training;Stair training;Functional mobility training;Therapeutic activities;Therapeutic exercise;Balance training;Neuromuscular re-education;Patient/family education;Manual techniques;Passive range of motion;Energy conservation;Taping    PT Next Visit Plan  Continue gait training c cues for posture. Advance functional strengthening and balance activities with stepping over/around obstacles, and SLS training to progress to [facility] for ambulation.       Patient will benefit from skilled therapeutic intervention in order to improve the following deficits and impairments:  Abnormal gait, Improper body mechanics, Decreased coordination, Decreased mobility, Decreased activity tolerance, Decreased endurance, Decreased strength, Decreased balance, Decreased safety awareness, Difficulty walking  Visit Diagnosis: Other symptoms and signs involving the musculoskeletal system  Other lack of coordination  Other abnormalities of gait and mobility  Muscle weakness (generalized)     Problem List Patient Active Problem List   Diagnosis Date Noted  . Contracture of right knee 03/06/2017  . Gait disturbance, post-stroke 03/06/2017  . Right thalamic infarction (HCC) 01/28/2017  . Thalamic stroke (HCC)   . TIA (transient ischemic attack) 01/25/2017  . Hypernatremia 01/25/2017  . CVA (cerebral vascular accident) (HCC) 01/25/2017  . Intertrochanteric fracture of left hip (HCC) 09/01/2014  . Hypothyroidism 09/01/2014  . Hypertension 09/01/2014  . Closed left hip fracture (HCC) 09/01/2014   11:12 AM,  04/04/17 Rosamaria Lints, PT, DPT Physical Therapist at [facility] [phone] (office)     Rosamaria Lints 04/04/2017, 11:12 AM  [facility] [facility] [address] [place], [place], [address] Phone: [phone]   Fax:  [phone]  Name: Andrea [name] MRN:  [id number] Date of Birth: September 19, 1925

## 2017-04-04 NOTE — Therapy (Signed)
[facility] [address] [place], [place], [address] Phone: [phone]   Fax:  [phone]  Occupational Therapy Treatment  Patient Details  Name: Andrea [name] MRN: [id number] Date of Birth: 04/14/1925 No Data Recorded  Encounter Date: 04/04/2017  OT End of Session - 04/04/17 1020    Visit Number  3    Number of Visits  8    Date for OT Re-Evaluation  04/16/17    OT Start Time  0935    OT Stop Time  1020    OT Time Calculation (min)  45 min    Activity Tolerance  Patient tolerated treatment well    Behavior During Therapy  [facility] for tasks assessed/performed       Past Medical History:  Diagnosis Date  . Hypertension   . Thyroid disease     Past Surgical History:  Procedure Laterality Date  . ABDOMINAL HYSTERECTOMY    . ORIF HIP FRACTURE Left 09/02/2014   Procedure: OPEN REDUCTION INTERNAL FIXATION LEFT HIP FRACTURE;  Surgeon: Darreld McleanWayne Keeling, MD;  Location: [facility];  Service: Orthopedics;  Laterality: Left;    There were no vitals filed for this visit.  Subjective Assessment - 04/04/17 0935    Subjective   S: My hands aren't that bad really.     Currently in Pain?  No/denies         [facility] Assessment - 04/04/17 0934      Assessment   Medical Diagnosis  S/P R CVA with LUE weakness               OT Treatments/Exercises (OP) - 04/04/17 0939      Exercises   Exercises  Shoulder;Hand      Hand Exercises   Hand Gripper with Large Beads  all beads gripper at 29#    Hand Gripper with Medium Beads  all beads gripper at 25#     Sponges  14, 17, 19    Small Pegboard  Pt completed small pegboard task placing pegs in order of provided pattern with left hand. Pt placed one peg at a time with min/mod difficulty. Pt was unable to hold multiple pegs at once while placing. Significantly increased time required for completion.                OT Short Term Goals - 03/27/17 1648      OT SHORT TERM GOAL #1   Title   Pt will be educated on and independent in HEP to improve use of LUE as non-dominant during B/IADL completion.     Time  4    Period  Weeks    Status  On-going      OT SHORT TERM GOAL #2   Title  Pt will improve LUE strength to 5/5 to improve ability to perform gardening tasks.     Time  4    Period  Weeks    Status  On-going      OT SHORT TERM GOAL #3   Title  Pt will improve left grip strength by 10# to improve ability to grasp and maintain hold on weighted objects.     Time  4    Period  Weeks    Status  On-going      OT SHORT TERM GOAL #4   Title  Pt will improve coordination by completing 9 hole peg test in 38" or less to improve ability to curl hair.    Time  4  Period  Weeks    Status  On-going      OT SHORT TERM GOAL #5   Title  Pt will return to highest level of functioning using LUE as non-dominant during functional task completion.     Time  4    Period  Weeks    Status  On-going               Plan - 04/04/17 1020    Clinical Impression Statement  A: Continued with grip strength training and fine motor coordination tasks. Pt with improvement in grip today, significantly increased time required for fine motor tasks. Pt reports she is completing HEP at home.     Plan  P: Resume proximal shoulder strengthening and add scapular theraband. Continue with fine motor coordination tasks-sponge stacking, pegs, etc       Patient will benefit from skilled therapeutic intervention in order to improve the following deficits and impairments:  Decreased strength, Decreased activity tolerance, Impaired UE functional use, Decreased coordination  Visit Diagnosis: Other symptoms and signs involving the musculoskeletal system  Other lack of coordination    Problem List Patient Active Problem List   Diagnosis Date Noted  . Contracture of right knee 03/06/2017  . Gait disturbance, post-stroke 03/06/2017  . Right thalamic infarction (HCC) 01/28/2017  . Thalamic stroke  (HCC)   . TIA (transient ischemic attack) 01/25/2017  . Hypernatremia 01/25/2017  . CVA (cerebral vascular accident) (HCC) 01/25/2017  . Intertrochanteric fracture of left hip (HCC) 09/01/2014  . Hypothyroidism 09/01/2014  . Hypertension 09/01/2014  . Closed left hip fracture [facility]) 09/01/2014   Ezra Sites, OTR/L  [phone] 04/04/2017, 10:22 AM  [facility] [address] [place], [place], [address] Phone: [phone]   Fax:  [phone]  Name: Karman Biswell MRN: [id number] Date of Birth: Mar 19, 1926

## 2017-04-08 ENCOUNTER — Telehealth (HOSPITAL_COMMUNITY): Payer: Self-pay

## 2017-04-08 NOTE — Telephone Encounter (Signed)
04/08/17  pt cx because she has an eye appt.

## 2017-04-09 ENCOUNTER — Ambulatory Visit (HOSPITAL_COMMUNITY): Payer: Medicare Other

## 2017-04-10 ENCOUNTER — Other Ambulatory Visit: Payer: Self-pay

## 2017-04-10 ENCOUNTER — Encounter: Payer: Medicare Other | Attending: Physical Medicine & Rehabilitation

## 2017-04-10 ENCOUNTER — Encounter: Payer: Self-pay | Admitting: Physical Medicine & Rehabilitation

## 2017-04-10 ENCOUNTER — Ambulatory Visit (HOSPITAL_BASED_OUTPATIENT_CLINIC_OR_DEPARTMENT_OTHER): Payer: Medicare Other | Admitting: Physical Medicine & Rehabilitation

## 2017-04-10 VITALS — BP 160/80 | HR 79

## 2017-04-10 DIAGNOSIS — Z789 Other specified health status: Secondary | ICD-10-CM | POA: Diagnosis present

## 2017-04-10 DIAGNOSIS — I639 Cerebral infarction, unspecified: Secondary | ICD-10-CM

## 2017-04-10 DIAGNOSIS — I69398 Other sequelae of cerebral infarction: Secondary | ICD-10-CM | POA: Insufficient documentation

## 2017-04-10 DIAGNOSIS — R269 Unspecified abnormalities of gait and mobility: Secondary | ICD-10-CM | POA: Insufficient documentation

## 2017-04-10 DIAGNOSIS — M24561 Contracture, right knee: Secondary | ICD-10-CM

## 2017-04-10 DIAGNOSIS — I6381 Other cerebral infarction due to occlusion or stenosis of small artery: Secondary | ICD-10-CM

## 2017-04-10 NOTE — Progress Notes (Signed)
Subjective:    Patient ID: Andrea [name], female    DOB: 01/28/1926, 84 y.o.   MRN: [id number] 84 year old right-handed female with history of hypertension, recurrent UTI, hypothyroidism.  Lives with daughter, independent with a cane prior to admission and active.  One-level home.  Presented on January 25, 2017, with left-sided weakness and numbness, blood pressure 209/96.  Troponin negative.  Cranial CT scan negative.  She did not receive tPA.  MRI showed acute right thalamic infarction.  MRA with no large vessel occlusion.  Carotid Dopplers with no ICA stenosis. Echocardiogram with ejection fraction of 65%.  No wall motion abnormalities. HPI   Left hand still a little numb Left foot a little heavy  Driving without difficulty  Has been going to outpatient PT OT at [facility]. Patient is wondering whether she still needs to go.  We discussed that she still having problem with her ambulation and coordination on the left side of her body. Pain Inventory Average Pain 2 Pain Right Now 2 My pain is aching  In the last 24 hours, has pain interfered with the following? General activity 2 Relation with others 1 Enjoyment of life 1 What TIME of day is your pain at its worst? morning Sleep (in general) Good  Pain is worse with: standing Pain improves with: pacing activities Relief from Meds: 7  Mobility use a walker ability to climb steps?  no do you drive?  yes  Function not employed: date last employed 1970 retired  Neuro/Psych tremor trouble walking  Prior Studies Any changes since last visit?  no  Physicians involved in your care Any changes since last visit?  no   Family History  Problem Relation Age of Onset  . CAD Mother 75       Died of MI  . Heart failure Father   . CAD Father    Social History   Socioeconomic History  . Marital status: Divorced    Spouse name: None  . Number of children: None  . Years of education: None  . Highest education  level: None  Social Needs  . Financial resource strain: None  . Food insecurity - worry: None  . Food insecurity - inability: None  . Transportation needs - medical: None  . Transportation needs - non-medical: None  Occupational History  . None  Tobacco Use  . Smoking status: Never Smoker  . Smokeless tobacco: Never Used  Substance and Sexual Activity  . Alcohol use: No  . Drug use: No  . Sexual activity: Not Currently  Other Topics Concern  . None  Social History Narrative  . None   Past Surgical History:  Procedure Laterality Date  . ABDOMINAL HYSTERECTOMY    . ORIF HIP FRACTURE Left 09/02/2014   Procedure: OPEN REDUCTION INTERNAL FIXATION LEFT HIP FRACTURE;  Surgeon: Darreld Mclean, MD;  Location: [facility];  Service: Orthopedics;  Laterality: Left;   Past Medical History:  Diagnosis Date  . Hypertension   . Thyroid disease    BP (!) 160/80   Pulse 79   SpO2 98%   Opioid Risk Score:   Fall Risk Score:  `1  Depression screen PHQ 2/9  Depression screen [facility] 2/9 04/10/2017 03/06/2017  Decreased Interest 0 0  Down, Depressed, Hopeless 0 0  PHQ - 2 Score 0 0  Altered sleeping - 1  Tired, decreased energy - 0  Change in appetite - 0  Feeling bad or failure about yourself  - 0  Trouble concentrating -  0  Moving slowly or fidgety/restless - 0  Suicidal thoughts - 0  PHQ-9 Score - 1  Difficult doing work/chores - Somewhat difficult     Review of Systems  Constitutional: Negative.   HENT: Negative.   Eyes: Negative.   Respiratory: Negative.   Cardiovascular: Negative.   Gastrointestinal: Positive for constipation.  Endocrine: Negative.   Genitourinary: Negative.   Musculoskeletal: Negative.   Skin: Negative.   Allergic/Immunologic: Negative.   Neurological: Negative.   Hematological: Negative.   Psychiatric/Behavioral: Negative.        Objective:   Physical Exam  Motor strength is 5/5 in the right deltoid bicep tricep grip hip flexor knee extensor ankle  dorsiflexor, 4+ in the left deltoid bicep tricep grip hip flexion extensor ankle dorsiflexor There is mild dysmetria on the left finger-nose-finger as well as left heel to shin Gait she is able to do hand-held assistance no evidence of toe drag or knee instability.  Her gait is unstable she tends to lean toward the left side.  She does have valgus deformity at the knee.       Assessment & Plan:  1.  Right thalamic infarct with residual left hemiataxia.  She does not note much in terms of motor weakness or sensory loss. Overall she is functioning quite well but not back to baseline.  I do recommend continued outpatient PT OT. Physical medicine rehab follow-up in approximately 2 months.

## 2017-04-10 NOTE — Patient Instructions (Signed)
Andrea [name] was your therapist last week.  Ask Ms Vincenza HewsQuinn to look at his note and do similar exercises

## 2017-04-11 ENCOUNTER — Encounter (HOSPITAL_COMMUNITY): Payer: Self-pay

## 2017-04-11 ENCOUNTER — Ambulatory Visit (HOSPITAL_COMMUNITY): Payer: Medicare Other

## 2017-04-11 DIAGNOSIS — R29898 Other symptoms and signs involving the musculoskeletal system: Secondary | ICD-10-CM | POA: Diagnosis not present

## 2017-04-11 DIAGNOSIS — I639 Cerebral infarction, unspecified: Secondary | ICD-10-CM

## 2017-04-11 DIAGNOSIS — I6381 Other cerebral infarction due to occlusion or stenosis of small artery: Secondary | ICD-10-CM

## 2017-04-11 DIAGNOSIS — R278 Other lack of coordination: Secondary | ICD-10-CM

## 2017-04-11 DIAGNOSIS — R2689 Other abnormalities of gait and mobility: Secondary | ICD-10-CM

## 2017-04-11 DIAGNOSIS — M6281 Muscle weakness (generalized): Secondary | ICD-10-CM

## 2017-04-11 NOTE — Therapy (Signed)
[facility] [address] [place], [place], [address] Phone: [phone]   Fax:  [phone]  Occupational Therapy Treatment  Patient Details  Name: Andrea [name] MRN: [id number] Date of Birth: 10/02/1925 Referring Provider: Dr. Claudette LawsAndrew Kirsteins   Encounter Date: 04/11/2017  OT End of Session - 04/11/17 0914    Visit Number  4    Number of Visits  8    Date for OT Re-Evaluation  04/16/17    Authorization Type  Medicare A & B    OT Start Time  0900    OT Stop Time  0945    OT Time Calculation (min)  45 min    Activity Tolerance  Patient tolerated treatment well    Behavior During Therapy  [facility] for tasks assessed/performed       Past Medical History:  Diagnosis Date  . Hypertension   . Thyroid disease     Past Surgical History:  Procedure Laterality Date  . ABDOMINAL HYSTERECTOMY    . ORIF HIP FRACTURE Left 09/02/2014   Procedure: OPEN REDUCTION INTERNAL FIXATION LEFT HIP FRACTURE;  Surgeon: Darreld McleanWayne Keeling, MD;  Location: [facility];  Service: Orthopedics;  Laterality: Left;    There were no vitals filed for this visit.  Subjective Assessment - 04/11/17 0912    Subjective   S: I haven't noticed a lot of weakness in my left hand, I think because my I'm right handed.     Currently in Pain?  No/denies Only arthritis in her knee                   OT Treatments/Exercises (OP) - 04/11/17 0904      Exercises   Exercises  Shoulder;Hand      Hand Exercises   Sponges  15,20    Other Hand Exercises  Pt completed grooved pegboard. Attempted to have patient complete in hand manipulation task as she picked up 5 pegs and transferred them from finger tip to palm and back to finger tip to place in board. Pt was unable to complete task with 5 pegs or with 2 pegs. Pt then completed grooved pegboard in a standard fashion.     Other Hand Exercises  Pt used handgripper set very low to work on stacking foam blocks 4 blocks high to work on  shoulder stability. Unable to make stacks of 5 without blocks falling. 3 stacks of four completed successfully      Grasp and Release   Fine Motor Coordination  In hand manipuation training          Balance Exercises - 04/11/17 0910      Balance Exercises: Standing   Retro Gait  2 reps    Sidestepping  2 reps    Step Over Hurdles / Cones  2RT over 6in hurdles and weaving cone           OT Short Term Goals - 03/27/17 1648      OT SHORT TERM GOAL #1   Title  Pt will be educated on and independent in HEP to improve use of LUE as non-dominant during B/IADL completion.     Time  4    Period  Weeks    Status  On-going      OT SHORT TERM GOAL #2   Title  Pt will improve LUE strength to 5/5 to improve ability to perform gardening tasks.     Time  4    Period  Weeks  Status  On-going      OT SHORT TERM GOAL #3   Title  Pt will improve left grip strength by 10# to improve ability to grasp and maintain hold on weighted objects.     Time  4    Period  Weeks    Status  On-going      OT SHORT TERM GOAL #4   Title  Pt will improve coordination by completing 9 hole peg test in 38" or less to improve ability to curl hair.    Time  4    Period  Weeks    Status  On-going      OT SHORT TERM GOAL #5   Title  Pt will return to highest level of functioning using LUE as non-dominant during functional task completion.     Time  4    Period  Weeks    Status  On-going               Plan - 04/11/17 1106    Clinical Impression Statement  A: Sessiosn focused on fine motor and gross motor coordinatino during table top task. Patient has increased difficulty with in hand manipulation when presented with grooved pegs as well as decreased shoulder stability. Increased time and several trials needed  to complete tasks.     Plan  P: Add scapular theraband for strengthening and resume proximal shoulder strengthening.        Patient will benefit from skilled therapeutic intervention  in order to improve the following deficits and impairments:  Decreased strength, Decreased activity tolerance, Impaired UE functional use, Decreased coordination  Visit Diagnosis: Other symptoms and signs involving the musculoskeletal system  Other lack of coordination    Problem List Patient Active Problem List   Diagnosis Date Noted  . Contracture of right knee 03/06/2017  . Gait disturbance, post-stroke 03/06/2017  . Right thalamic infarction (HCC) 01/28/2017  . Thalamic stroke (HCC)   . TIA (transient ischemic attack) 01/25/2017  . Hypernatremia 01/25/2017  . CVA (cerebral vascular accident) (HCC) 01/25/2017  . Intertrochanteric fracture of left hip (HCC) 09/01/2014  . Hypothyroidism 09/01/2014  . Hypertension 09/01/2014  . Closed left hip fracture [facility]) 09/01/2014   Limmie Patricia, OTR/L,CBIS  [phone]  04/11/2017, 11:08 AM  [facility] [address] [place], [place], [address] Phone: [phone]   Fax:  [phone]  Name: Laqueena Hinchey MRN: [id number] Date of Birth: 11/03/1925

## 2017-04-11 NOTE — Therapy (Signed)
[facility] [address] [place], [place], [address] Phone: [phone]   Fax:  [phone]  Physical Therapy Treatment  Patient Details  Name: Andrea [name] MRN: [id number] Date of Birth: 05/23/1925 Referring Provider: Dr. Claudette LawsAndrew Kirsteins   Encounter Date: 04/11/2017  PT End of Session - 04/11/17 0825    Visit Number  4    Number of Visits  17    Date for PT Re-Evaluation  04/14/17    Authorization Type  Medicare Part A/B    Authorization Time Period  03/17/17 - 05/12/17    Authorization - Visit Number  4    Authorization - Number of Visits  10    PT Start Time  [phone]    PT Stop Time  0900    PT Time Calculation (min)  46 min    Equipment Utilized During Treatment  Gait belt    Activity Tolerance  Patient tolerated treatment well;No increased pain    Behavior During Therapy  WFL for tasks assessed/performed       Past Medical History:  Diagnosis Date  . Hypertension   . Thyroid disease     Past Surgical History:  Procedure Laterality Date  . ABDOMINAL HYSTERECTOMY    . ORIF HIP FRACTURE Left 09/02/2014   Procedure: OPEN REDUCTION INTERNAL FIXATION LEFT HIP FRACTURE;  Surgeon: Darreld McleanWayne Keeling, MD;  Location: [facility];  Service: Orthopedics;  Laterality: Left;    There were no vitals filed for this visit.  Subjective Assessment - 04/11/17 0815    Subjective  Pt stated she has had a busy week, reports she didn't sleep well on Wednesday due to [place] of [place].  The arthritis is bad in her knees today, pain scale 5/10    Patient Stated Goals  Get stronger and improve walking to prior ability and work back towards cane    Currently in Pain?  Yes    Pain Score  5     Pain Location  Knee    Pain Orientation  Right;Left    Pain Descriptors / Indicators  Aching stiffness    Pain Type  Chronic pain    Pain Frequency  Intermittent    Aggravating Factors   unsure    Pain Relieving Factors  sitting down, keeping them  moving                      [facility] Adult PT Treatment/Exercise - 04/11/17 0001      Ambulation/Gait   Ambulation/Gait  Yes    Ambulation/Gait Assistance  5: Supervision    Ambulation/Gait Assistance Details  552    Assistive device  Standard walker;Straight cane    Gait Pattern  Step-through pattern;Decreased step length - left;Decreased stride length;Decreased hip/knee flexion - left;Decreased dorsiflexion - left;Wide base of support;Trunk flexed;Poor foot clearance - left    Gait Comments  good sequence with SPC, cueing for posture through session and to increase stride length      Knee/Hip Exercises: Standing   Heel Raises  10 reps TOe raises 10x      Knee/Hip Exercises: Seated   Long Arc Quad  Both;1 set;15 reps    Sit to Sand  10 reps;without UE support      Knee/Hip Exercises: Supine   Bridges  15 reps    Bridges Limitations  RTB          Balance Exercises - 04/11/17 0910      Balance Exercises:  Standing   Retro Gait  2 reps    Sidestepping  2 reps    Step Over Hurdles / Cones  2RT over 6in hurdles and weaving cone           PT Short Term Goals - 03/17/17 1848      PT SHORT TERM GOAL #1   Title  Pateint will improve MMT by 1/2 grade for all groups tested to improve functional strength for improved gait.    Time  4    Status  New    Target Date  04/14/17      PT SHORT TERM GOAL #2   Title  pateitn will be independent with HEP and be able to demonstrate proper technique/form wtih exercises.    Time  4    Period  Weeks    Status  New      PT SHORT TERM GOAL #3   Title  Patient will improve 5x sit to stand time by 5 seconds to demonstrate significant change in BLE strength.    Time  4    Status  New        PT Long Term Goals - 03/17/17 1842      PT LONG TERM GOAL #1   Title  Pt to be able to walk without an assistive device to be able to carry items in one hand in her home to complete ADL's.    Time  8    Period  Weeks    Status   New    Target Date  05/12/17      PT LONG TERM GOAL #2   Title  Pt strength will improve by 1 grade for all muscle groups tested to be able to perform functional activities cooking and grocery shopping.    Time  8    Period  Weeks    Status  New      PT LONG TERM GOAL #3   Title  Patient will improve TUG  5 seconds while performing with SPC to decrease fall risk while ambulating.    Time  8    Period  Weeks    Status  New            Plan - 04/11/17 0926    Clinical Impression Statement  Progressed balance activities this session with focus on SLS with gait mechanics.  Added hurdles and cone weaving as well as gait training with SPC.  Cueing to improve posture awareness through session.  No LOB episodes during gait training with LRAD, was educated on benefits of increasing stride length to normalize gait mechanics and energy conservation.  No reports of increased pain, did c/o stiffness Rt knee through session.      Rehab Potential  Good    PT Frequency  2x / week    PT Duration  8 weeks    PT Treatment/Interventions  ADLs/Self Care Home Management;Electrical Stimulation;DME Instruction;Gait training;Stair training;Functional mobility training;Therapeutic activities;Therapeutic exercise;Balance training;Neuromuscular re-education;Patient/family education;Manual techniques;Passive range of motion;Energy conservation;Taping    PT Next Visit Plan  Add postural strengthening next session (wall arch).  Continue gait training c cues for posture. Advance functional strengthening and balance activities with stepping over/around obstacles, and SLS training to progress to [facility] for ambulation.    PT Home Exercise Plan  12/20 - bridge and clamshell wtih red TB, side stepping at counter       Patient will benefit from skilled therapeutic intervention in order to improve the following deficits and  impairments:  Abnormal gait, Improper body mechanics, Decreased coordination, Decreased mobility,  Decreased activity tolerance, Decreased endurance, Decreased strength, Decreased balance, Decreased safety awareness, Difficulty walking  Visit Diagnosis: Other abnormalities of gait and mobility  Muscle weakness (generalized)  Thalamic stroke (HCC)  Poor balance     Problem List Patient Active Problem List   Diagnosis Date Noted  . Contracture of right knee 03/06/2017  . Gait disturbance, post-stroke 03/06/2017  . Right thalamic infarction (HCC) 01/28/2017  . Thalamic stroke (HCC)   . TIA (transient ischemic attack) 01/25/2017  . Hypernatremia 01/25/2017  . CVA (cerebral vascular accident) (HCC) 01/25/2017  . Intertrochanteric fracture of left hip (HCC) 09/01/2014  . Hypothyroidism 09/01/2014  . Hypertension 09/01/2014  . Closed left hip fracture (HCC) 09/01/2014    Juel Burrow 04/11/2017, 11:31 AM  [facility] [address] [place], [place], [address] Phone: [phone]   Fax:  [phone]  Name: Andrea [name] MRN: [id number] Date of Birth: 1925/08/18

## 2017-04-16 ENCOUNTER — Encounter (HOSPITAL_COMMUNITY): Payer: Self-pay

## 2017-04-16 ENCOUNTER — Other Ambulatory Visit: Payer: Self-pay

## 2017-04-16 ENCOUNTER — Encounter (HOSPITAL_COMMUNITY): Payer: Self-pay | Admitting: Occupational Therapy

## 2017-04-16 ENCOUNTER — Ambulatory Visit (HOSPITAL_COMMUNITY): Payer: Medicare Other

## 2017-04-16 ENCOUNTER — Ambulatory Visit (HOSPITAL_COMMUNITY): Payer: Medicare Other | Admitting: Occupational Therapy

## 2017-04-16 DIAGNOSIS — I6381 Other cerebral infarction due to occlusion or stenosis of small artery: Secondary | ICD-10-CM

## 2017-04-16 DIAGNOSIS — M6281 Muscle weakness (generalized): Secondary | ICD-10-CM

## 2017-04-16 DIAGNOSIS — R2689 Other abnormalities of gait and mobility: Secondary | ICD-10-CM

## 2017-04-16 DIAGNOSIS — I639 Cerebral infarction, unspecified: Secondary | ICD-10-CM

## 2017-04-16 DIAGNOSIS — R278 Other lack of coordination: Secondary | ICD-10-CM

## 2017-04-16 DIAGNOSIS — R29898 Other symptoms and signs involving the musculoskeletal system: Secondary | ICD-10-CM | POA: Diagnosis not present

## 2017-04-16 NOTE — Therapy (Signed)
[facility] [address] [place], [place], [address] Phone: [phone]   Fax:  [phone]  Occupational Therapy Reassessment and Treatment (recertification)  Patient Details  Name: Andrea [name] MRN: [id number] Date of Birth: 31-Jul-1925 Referring Provider: Dr. Alysia Penna   Encounter Date: 04/16/2017  OT End of Session - 04/16/17 1050    Visit Number  5    Number of Visits  13    Date for OT Re-Evaluation  05/16/17    Authorization Type  Medicare A & B    OT Start Time  0946    OT Stop Time  1030    OT Time Calculation (min)  44 min    Activity Tolerance  Patient tolerated treatment well    Behavior During Therapy  [facility] for tasks assessed/performed       Past Medical History:  Diagnosis Date  . Hypertension   . Thyroid disease     Past Surgical History:  Procedure Laterality Date  . ABDOMINAL HYSTERECTOMY    . ORIF HIP FRACTURE Left 09/02/2014   Procedure: OPEN REDUCTION INTERNAL FIXATION LEFT HIP FRACTURE;  Surgeon: Sanjuana Kava, MD;  Location: [facility];  Service: Orthopedics;  Laterality: Left;    There were no vitals filed for this visit.  Subjective Assessment - 04/16/17 0937    Subjective   S: I'm so sleepy I can't maneuver.     Currently in Pain?  No/denies         [facility] OT Assessment - 04/16/17 0937      Assessment   Medical Diagnosis  S/P R CVA with LUE weakness      Coordination   Left 9 Hole Peg Test  _0  45.90" previous      Strength   Right/Left Shoulder  Left    Left Shoulder Flexion  4/5 same as previous    Left Shoulder ABduction  4/5 same as previous    Left Shoulder Internal Rotation  4/5 4-/5 previous    Left Shoulder External Rotation  4-/5 same as previous    Right/Left Elbow  Left    Left Elbow Flexion  4/5 4-/5 previous    Left Elbow Extension  4/5 same as previous    Left Hand Gross Grasp  Functional    Left Hand Grip (lbs)  30 22 previous    Left Hand Lateral Pinch  8 lbs 7    Left Hand 3 Point Pinch  8 lbs 7               OT Treatments/Exercises (OP) - 04/16/17 0951      Exercises   Exercises  Shoulder;Hand      Shoulder Exercises: Seated   Extension  Theraband;10 reps    Theraband Level (Shoulder Extension)  Level 2 (Red)    Retraction  Theraband;10 reps    Theraband Level (Shoulder Retraction)  Level 2 (Red)    Row  Theraband;10 reps    Theraband Level (Shoulder Row)  Level 2 (Red)    Protraction  Strengthening;10 reps    Protraction Weight (lbs)  1    Horizontal ABduction  Strengthening;10 reps    Horizontal ABduction Weight (lbs)  1    External Rotation  Strengthening;10 reps    External Rotation Weight (lbs)  1    Internal Rotation  Strengthening;10 reps    Internal Rotation Weight (lbs)  1    Flexion  Strengthening;10 reps    Flexion Weight (lbs)  1  Abduction  Strengthening;10 reps    ABduction Weight (lbs)  1      Shoulder Exercises: ROM/Strengthening   Proximal Shoulder Strengthening, Seated  10 times each      Hand Exercises   Hand Gripper with Large Beads  all beads gripper at 29#    Hand Gripper with Medium Beads  all beads gripper at 29#     Hand Gripper with Small Beads  all beads gripper at 25# (horizontal)      Fine Motor Coordination (Hand/Wrist)   Fine Motor Coordination  Manipulating coins;Picking up coins;Stacking coins    Picking up coins  Pt picked up 8 coins from tabletop with left hand, min difficulty     Manipulating coins  Pt held 5 and 10 coins in palm of hand working on translating from palm to fingertip and placing in slotted container with left hand. mod difficulty.     Stacking coins  Pt worked on [employer] with left hand, was able to stack 7 and 10 coins with mod difficulty               OT Short Term Goals - 03/27/17 1648      OT SHORT TERM GOAL #1   Title  Pt will be educated on and independent in HEP to improve use of LUE as non-dominant during B/IADL completion.     Time  4     Period  Weeks    Status  On-going      OT SHORT TERM GOAL #2   Title  Pt will improve LUE strength to 5/5 to improve ability to perform gardening tasks.     Time  4    Period  Weeks    Status  On-going      OT SHORT TERM GOAL #3   Title  Pt will improve left grip strength by 10# to improve ability to grasp and maintain hold on weighted objects.     Time  4    Period  Weeks    Status  On-going      OT SHORT TERM GOAL #4   Title  Pt will improve coordination by completing 9 hole peg test in 38" or less to improve ability to curl hair.    Time  4    Period  Weeks    Status  On-going      OT SHORT TERM GOAL #5   Title  Pt will return to highest level of functioning using LUE as non-dominant during functional task completion.     Time  4    Period  Weeks    Status  On-going               Plan - 04/16/17 1052    Clinical Impression Statement  A: Reassessment completed this session, pt has made progress with grip and pinch strength and some LUE strength, has not met any goals thus far, however has only attended 5 therapy sessions so far. Pt coordination time decrased with 9 hole peg test, however suspect due to pt distractions in gym environment. Added LUE strengthening and scapular strengthening this session, continued with grip strengthening and coordination activities. Verbal cuing for form and technique with new exercises.     Occupational Profile and client history currently impacting functional performance  Pt was independent prior to CVA and is motivated to regain and maintain independence in B/IADLs.     Occupational performance deficits (Please refer to evaluation for details):  ADL's;IADL's;Leisure;Social Participation  Rehab Potential  Good    OT Frequency  2x / week    OT Duration  4 weeks    OT Treatment/Interventions  Self-care/ADL training;Patient/family education;Passive range of motion;Electrical Stimulation;Moist Heat;Therapeutic exercise;Therapeutic  activities    Plan  P: Continue skilled OT services to focus on improving LUE strength, grip and pinch strength, and coordination required for functional task completion. Next session: continue with LUE strengthening and coordination tasks    Consulted and Agree with Plan of Care  Patient       Patient will benefit from skilled therapeutic intervention in order to improve the following deficits and impairments:  Decreased strength, Decreased activity tolerance, Impaired UE functional use, Decreased coordination  Visit Diagnosis: Other symptoms and signs involving the musculoskeletal system  Other lack of coordination    Problem List Patient Active Problem List   Diagnosis Date Noted  . Contracture of right knee 03/06/2017  . Gait disturbance, post-stroke 03/06/2017  . Right thalamic infarction ([place]) 01/28/2017  . Thalamic stroke ([place])   . TIA (transient ischemic attack) 01/25/2017  . Hypernatremia 01/25/2017  . CVA (cerebral vascular accident) ([place]) 01/25/2017  . Intertrochanteric fracture of left hip ([place]) 09/01/2014  . Hypothyroidism 09/01/2014  . Hypertension 09/01/2014  . Closed left hip fracture [facility]) 09/01/2014   Guadelupe Sabin, OTR/L  [phone] 04/16/2017, 11:14 AM  [place], [place], [address] Phone: [phone]   Fax:  [phone]  Name: Andrea [name] MRN: [id number] Date of Birth: [birth year]

## 2017-04-16 NOTE — Therapy (Signed)
[place], [place], [address] Phone: [phone]   Fax:  [phone]  Physical Therapy Treatment/Re-Assessment  Patient Details  Name: Andrea [name] MRN: [id number] Date of Birth: 1925-11-29 Referring Provider: Dr. Alysia Penna   Encounter Date: 04/16/2017  PT End of Session - 04/16/17 1217    Visit Number  5    Number of Visits  17    Date for PT Re-Evaluation  05/12/17    Authorization Type  Medicare Part A/B    Authorization Time Period  03/17/17 - 05/12/17    Authorization - Visit Number  5    Authorization - Number of Visits  10    PT Start Time  0905    PT Stop Time  0945    PT Time Calculation (min)  40 min    Equipment Utilized During Treatment  Gait belt    Activity Tolerance  Patient tolerated treatment well;No increased pain    Behavior During Therapy  WFL for tasks assessed/performed       Past Medical History:  Diagnosis Date  . Hypertension   . Thyroid disease     Past Surgical History:  Procedure Laterality Date  . ABDOMINAL HYSTERECTOMY    . ORIF HIP FRACTURE Left 09/02/2014   Procedure: OPEN REDUCTION INTERNAL FIXATION LEFT HIP FRACTURE;  Surgeon: Sanjuana Kava, MD;  Location: [facility];  Service: Orthopedics;  Laterality: Left;    There were no vitals filed for this visit.  Subjective Assessment - 04/16/17 0933    Subjective  Patient is not sleeping well before appointments becasue she is anxious she will miss them by oversleeping. AShe reports she has continued to work on her HEP daily and does some exercises every day and some every other day.    Pertinent History  left hip fracture with intramedullary nail approximately 2 years ago    Patient Stated Goals  Get stronger and improve walking to prior ability and work back towards cane    Currently in Pain?  No/denies         [facility] PT Assessment - 04/16/17 0001      Assessment   Medical Diagnosis  S/P R CVA with LUE weakness    Referring Provider  Dr. Alysia Penna    Onset Date/Surgical Date  01/25/17    Hand Dominance  Right    Next MD Visit  Approximately 05/29/17    Prior Therapy  CIR 01/29/2017-02/07/2017      Precautions   Precautions  None      Restrictions   Weight Bearing Restrictions  No      Balance Screen   Has the patient fallen in the past 6 months  No      Prior Function   Level of Independence  Independent with basic ADLs      Cognition   Overall Cognitive Status  Within Functional Limits for tasks assessed      Observation/Other Assessments   Focus on Therapeutic Outcomes (FOTO)   -- 45% taken 03/17/18      Strength   Right Hip Flexion  4+/5 4+/5 on 03/17/17    Right Hip Extension  4-/5    Right Hip ABduction  4/5    Left Hip Flexion  5/5 4+/5 on 03/17/17    Left Hip Extension  4/5    Left Hip ABduction  4+/5    Right Knee Flexion  5/5 4+/5 on 03/17/17    Right Knee Extension  5/5  4+/5 on 03/17/17    Left Knee Flexion  5/5 4+/5 on 03/17/17    Left Knee Extension  5/5 4/5 on 03/17/17    Right Ankle Dorsiflexion  5/5 4+/5 on 03/17/17    Left Ankle Dorsiflexion  5/5 4/5 on 03/17/17      Ambulation/Gait   Ambulation/Gait  Yes    Ambulation/Gait Assistance  6: Modified independent (Device/Increase time)    Assistive device  Rolling walker    Gait Pattern  Step-through pattern;Decreased stride length;Trunk flexed      Standardized Balance Assessment   Five times sit to stand comments   23 was 22.5 seconds on 03/27/17      Timed Up and Go Test   Normal TUG (seconds)  24 was 25.9 seconds on 03/27/17        [facility] Adult PT Treatment/Exercise - 04/16/17 0001      Ambulation/Gait   Ambulation Distance (Feet)  326 Feet 3 MWT     Gait velocity  0.54 m/s was 0.42 m/s on 03/27/17    Gait Comments  Gait training with SPC for ~ 25mnutes. Verbal ceus for upright posture and sequencing cane with LLE. Patient iproved throughout and demonstrated good gait velocity similar to ambulation  wtih RW. No overt LOB during trianing and patient will benefit form further  instruction and training.       Knee/Hip Exercises: Seated   Sit to Sand  10 reps;1 set;without UE support cues: forward trunk lean, slow lowering for eccentric contro         PT Education - 04/16/17 1512    Education provided  Yes    Education Details  Educated on progress made in PT and continued SPC gait training.    Person(s) Educated  Patient    Methods  Explanation    Comprehension  Verbalized understanding;Returned demonstration       PT Short Term Goals - 04/16/17 0908      PT SHORT TERM GOAL #1   Title  Pateint will improve MMT by 1/2 grade for all groups tested to improve functional strength for improved gait.    Baseline  04/16/17 - met    Time  4    Status  Achieved      PT SHORT TERM GOAL #2   Title  pateitn will be independent with HEP and be able to demonstrate proper technique/form wtih exercises.    Baseline  04/16/17 - patient does some every day and some every other day    Time  4    Period  Weeks    Status  Achieved      PT SHORT TERM GOAL #3   Title  Patient will improve 5x sit to stand time by 5 seconds to demonstrate significant change in BLE strength.    Baseline  04/16/17 - 23 seconds    Time  4    Status  On-going        PT Long Term Goals - 04/16/17 0909      PT LONG TERM GOAL #1   Title  Pt to be able to walk without an assistive device to be able to carry items in one hand in her home to complete ADL's.    Baseline  04/16/17 - continuing gait training with SPC    Time  8    Period  Weeks    Status  On-going      PT LONG TERM GOAL #2   Title  Pt strength will improve  by 1 grade for all muscle groups tested to be able to perform functional activities cooking and grocery shopping.    Baseline  04/16/17 - all group improved to 5/5 except hip flexion (improved by 1/2 grade) able to achieve position to test hip abduction/extension today    Time  8    Period  Weeks     Status  Partially Met      PT LONG TERM GOAL #3   Title  Patient will improve TUG  5 seconds while performing with SPC to decrease fall risk while ambulating.    Baseline  04/16/17 - 24 seconds    Time  8    Period  Weeks    Status  On-going            Plan - 04/16/17 1511    Clinical Impression Statement  Re-assessment performed today and patient has met/partially met 2/3 short term goals and 1/3 long term goals. She has made significant improvements with bilateral lower extremity strength but remains limited with functional gait velocity and balance. She has been able to initiate gait training with single point cane and will continue to benefit from functional training to improve gait velocity and decrease risk of falling. She will benefit from continued skilled PT services to address remaining deficits and progress towards remaining goals.    Rehab Potential  Good    PT Frequency  2x / week    PT Duration  8 weeks    PT Treatment/Interventions  ADLs/Self Care Home Management;Electrical Stimulation;DME Instruction;Gait training;Stair training;Functional mobility training;Therapeutic activities;Therapeutic exercise;Balance training;Neuromuscular re-education;Patient/family education;Manual techniques;Passive range of motion;Energy conservation;Taping    PT Next Visit Plan  Update HEP next session. Take FOTO. Add postural strengthening next session (wall arch).  Continue gait training and advacne to [facility] with cues for posture and sequencing. Advance functional strengthening and balance activities with stepping over/around obstacles, and SLS training to progress to [facility] for ambulation.    PT Home Exercise Plan  12/20 - bridge and clamshell wtih red TB, side stepping at counter    Consulted and Agree with Plan of Care  Patient       Patient will benefit from skilled therapeutic intervention in order to improve the following deficits and impairments:  Abnormal gait, Improper body mechanics,  Decreased coordination, Decreased mobility, Decreased activity tolerance, Decreased endurance, Decreased strength, Decreased balance, Decreased safety awareness, Difficulty walking  Visit Diagnosis: Other abnormalities of gait and mobility  Muscle weakness (generalized)  Thalamic stroke (HCC)  Poor balance     Problem List Patient Active Problem List   Diagnosis Date Noted  . Contracture of right knee 03/06/2017  . Gait disturbance, post-stroke 03/06/2017  . Right thalamic infarction ([place]) 01/28/2017  . Thalamic stroke ([place])   . TIA (transient ischemic attack) 01/25/2017  . Hypernatremia 01/25/2017  . CVA (cerebral vascular accident) ([place]) 01/25/2017  . Intertrochanteric fracture of left hip ([place]) 09/01/2014  . Hypothyroidism 09/01/2014  . Hypertension 09/01/2014  . Closed left hip fracture ([place]) 09/01/2014    Kipp Brood, PT, DPT Physical Therapist with [facility]  04/16/2017 3:22 PM    [facility] [address] [place], [place], [address] Phone: [phone]   Fax:  [phone]  Name: Andrea [name] MRN: [id number] Date of Birth: 1925-04-23

## 2017-04-18 ENCOUNTER — Encounter (HOSPITAL_COMMUNITY): Payer: Self-pay

## 2017-04-18 ENCOUNTER — Ambulatory Visit (HOSPITAL_COMMUNITY): Payer: Medicare Other

## 2017-04-18 DIAGNOSIS — R278 Other lack of coordination: Secondary | ICD-10-CM

## 2017-04-18 DIAGNOSIS — R29898 Other symptoms and signs involving the musculoskeletal system: Secondary | ICD-10-CM | POA: Diagnosis not present

## 2017-04-18 DIAGNOSIS — I639 Cerebral infarction, unspecified: Secondary | ICD-10-CM

## 2017-04-18 DIAGNOSIS — R2689 Other abnormalities of gait and mobility: Secondary | ICD-10-CM

## 2017-04-18 DIAGNOSIS — M6281 Muscle weakness (generalized): Secondary | ICD-10-CM

## 2017-04-18 DIAGNOSIS — I6381 Other cerebral infarction due to occlusion or stenosis of small artery: Secondary | ICD-10-CM

## 2017-04-18 NOTE — Therapy (Signed)
[place], [place], [address] Phone: [phone]   Fax:  [phone]  Physical Therapy Treatment  Patient Details  Name: Andrea [name] MRN: [id number] Date of Birth: 08-08-1925 Referring Provider: Dr. Alysia Penna   Encounter Date: 04/18/2017  PT End of Session - 04/18/17 0825    Visit Number  6    Number of Visits  17    Date for PT Re-Evaluation  05/12/17    Authorization Type  Medicare Part A/B    Authorization Time Period  03/17/17 - 05/12/17    Authorization - Visit Number  6    Authorization - Number of Visits  10    PT Start Time  0815    PT Stop Time  0902    PT Time Calculation (min)  47 min    Equipment Utilized During Treatment  Gait belt    Activity Tolerance  Patient tolerated treatment well;No increased pain    Behavior During Therapy  WFL for tasks assessed/performed       Past Medical History:  Diagnosis Date  . Hypertension   . Thyroid disease     Past Surgical History:  Procedure Laterality Date  . ABDOMINAL HYSTERECTOMY    . ORIF HIP FRACTURE Left 09/02/2014   Procedure: OPEN REDUCTION INTERNAL FIXATION LEFT HIP FRACTURE;  Surgeon: Sanjuana Kava, MD;  Location: [facility];  Service: Orthopedics;  Laterality: Left;    There were no vitals filed for this visit.  Subjective Assessment - 04/18/17 0825    Subjective  Pt stated she is sleepy today, no reports of pain.      Pertinent History  left hip fracture with intramedullary nail approximately 2 years ago    Patient Stated Goals  Get stronger and improve walking to prior ability and work back towards cane    Currently in Pain?  No/denies         [facility] PT Assessment - 04/18/17 1550      Assessment   Medical Diagnosis  S/P R CVA with LUE weakness    Referring Provider  Dr. Alysia Penna    Onset Date/Surgical Date  01/25/17    Hand Dominance  Right    Next MD Visit  Approximately 05/29/17    Prior Therapy  CIR  01/29/2017-02/07/2017      Precautions   Precautions  None      Observation/Other Assessments   Focus on Therapeutic Outcomes (FOTO)   53.38 was 45%                  [facility] Adult PT Treatment/Exercise - 04/18/17 0001      Ambulation/Gait   Ambulation/Gait  Yes    Ambulation/Gait Assistance  4: Min guard    Ambulation Distance (Feet)  552 Feet    Assistive device  Straight cane    Gait Pattern  Step-through pattern;Decreased stride length;Trunk flexed    Ambulation Surface  Level    Gait Comments  Demonstrates appropriate sequence with SPC.  Requires cueing for posture and to increase stride length      Knee/Hip Exercises: Aerobic   Nustep  5' UE/LE SPC goal of 60      Knee/Hip Exercises: Seated   Sit to Sand  10 reps;without UE support          Balance Exercises - 04/18/17 0905      Balance Exercises: Standing   Tandem Stance  Eyes open;2 reps tandem stance with 2# dowel rod UE  flexion 5 reps each    SLS  -- cone tapping 5x each LE    Retro Gait  2 reps    Sidestepping  2 reps    Step Over Hurdles / Cones  2RT over 6in hurdles and weaving cone           PT Short Term Goals - 04/16/17 0908      PT SHORT TERM GOAL #1   Title  Pateint will improve MMT by 1/2 grade for all groups tested to improve functional strength for improved gait.    Baseline  04/16/17 - met    Time  4    Status  Achieved      PT SHORT TERM GOAL #2   Title  pateitn will be independent with HEP and be able to demonstrate proper technique/form wtih exercises.    Baseline  04/16/17 - patient does some every day and some every other day    Time  4    Period  Weeks    Status  Achieved      PT SHORT TERM GOAL #3   Title  Patient will improve 5x sit to stand time by 5 seconds to demonstrate significant change in BLE strength.    Baseline  04/16/17 - 23 seconds    Time  4    Status  On-going        PT Long Term Goals - 04/16/17 0909      PT LONG TERM GOAL #1   Title  Pt to be  able to walk without an assistive device to be able to carry items in one hand in her home to complete ADL's.    Baseline  04/16/17 - continuing gait training with SPC    Time  8    Period  Weeks    Status  On-going      PT LONG TERM GOAL #2   Title  Pt strength will improve by 1 grade for all muscle groups tested to be able to perform functional activities cooking and grocery shopping.    Baseline  04/16/17 - all group improved to 5/5 except hip flexion (improved by 1/2 grade) able to achieve position to test hip abduction/extension today    Time  8    Period  Weeks    Status  Partially Met      PT LONG TERM GOAL #3   Title  Patient will improve TUG  5 seconds while performing with SPC to decrease fall risk while ambulating.    Baseline  04/16/17 - 24 seconds    Time  8    Period  Weeks    Status  On-going            Plan - 04/18/17 1537    Clinical Impression Statement  Began session with Nustep for activity tolerance and to improve sequence with UE/LE wtih gait.  FOTO complete during Nustep (not included with charges).  Session focus on functional strengthening and balance training with obstacles and dynamic surfaces.  Min A required for safety with SLS activities.  Pt able to demonstrate good sequence wiht SPC did require cueing to improve posture through session to assist with balance and improve gait mechanics.  No reports of pain through session, was limited by fatigue.  Added sit to stand to HEP for functional strengthening.      Rehab Potential  Good    PT Frequency  2x / week    PT Duration  8 weeks  PT Treatment/Interventions  ADLs/Self Care Home Management;Electrical Stimulation;DME Instruction;Gait training;Stair training;Functional mobility training;Therapeutic activities;Therapeutic exercise;Balance training;Neuromuscular re-education;Patient/family education;Manual techniques;Passive range of motion;Energy conservation;Taping    PT Next Visit Plan  Add postural  strengthening next session (wall arch).  Continue gait training and advacne to [facility] with cues for posture and sequencing. Advance functional strengthening and balance activities with stepping over/around obstacles, and SLS training to progress to [facility] for ambulation.    PT Home Exercise Plan  12/20 - bridge and clamshell wtih red TB, side stepping at counter; 01/18: STS       Patient will benefit from skilled therapeutic intervention in order to improve the following deficits and impairments:  Abnormal gait, Improper body mechanics, Decreased coordination, Decreased mobility, Decreased activity tolerance, Decreased endurance, Decreased strength, Decreased balance, Decreased safety awareness, Difficulty walking  Visit Diagnosis: Thalamic stroke (HCC)  Poor balance  Other abnormalities of gait and mobility  Muscle weakness (generalized)     Problem List Patient Active Problem List   Diagnosis Date Noted  . Contracture of right knee 03/06/2017  . Gait disturbance, post-stroke 03/06/2017  . Right thalamic infarction ([place]) 01/28/2017  . Thalamic stroke ([place])   . TIA (transient ischemic attack) 01/25/2017  . Hypernatremia 01/25/2017  . CVA (cerebral vascular accident) ([place]) 01/25/2017  . Intertrochanteric fracture of left hip ([place]) 09/01/2014  . Hypothyroidism 09/01/2014  . Hypertension 09/01/2014  . Closed left hip fracture [facility]) 09/01/2014   Ihor Austin, [place]; [place]  Aldona Lento 04/18/2017, 3:54 PM  [place], [place], [address] Phone: [phone]   Fax:  [phone]  Name: Andrea [name] MRN: [id number] Date of Birth: 15-Mar-1926

## 2017-04-18 NOTE — Therapy (Signed)
[place] [facility] [address] , [place], [address] Phone: [phone]   Fax:  [phone]  Occupational Therapy Treatment  Patient Details  Name: Andrea [name] MRN: [id number] Date of Birth: 09/05/1925 Referring Provider: Dr. Claudette LawsAndrew Kirsteins   Encounter Date: 04/18/2017  OT End of Session - 04/18/17 1014    Visit Number  6    Number of Visits  13    Date for OT Re-Evaluation  05/16/17    Authorization Type  Medicare A & B    OT Start Time  0904    OT Stop Time  0947    OT Time Calculation (min)  43 min    Activity Tolerance  Patient tolerated treatment well    Behavior During Therapy  [facility] for tasks assessed/performed       Past Medical History:  Diagnosis Date  . Hypertension   . Thyroid disease     Past Surgical History:  Procedure Laterality Date  . ABDOMINAL HYSTERECTOMY    . ORIF HIP FRACTURE Left 09/02/2014   Procedure: OPEN REDUCTION INTERNAL FIXATION LEFT HIP FRACTURE;  Surgeon: Darreld McleanWayne Keeling, MD;  Location: [facility];  Service: Orthopedics;  Laterality: Left;    There were no vitals filed for this visit.  Subjective Assessment - 04/18/17 1014    Subjective   S: I ready to do what I want to do, ya know?    Currently in Pain?  No/denies         [facility] - 04/18/17 0916      Assessment   Medical Diagnosis  S/P R CVA with LUE weakness      Precautions   Precautions  None               OT Treatments/Exercises (OP) - 04/18/17 0916      Exercises   Exercises  Hand      Hand Exercises   Sponges  14,17    Other Hand Exercises  using red resistive clothespin to pick up 30 sponges with 3 point pinch. VC to maintain proper form and technique.       Neurological Re-education Exercises   Theraputty - Flatten  red standing    Theraputty - Roll  red seated    Theraputty - Grip  red    Theraputty - Pinch  red - 3 point and lateral    Theraputty Hand- Locate Pegs  8 beads located           Balance Exercises - 04/18/17 0905      Balance Exercises: Standing   Tandem Stance  Eyes open;2 reps tandem stance with 2# dowel rod UE flexion 5 reps each    SLS  -- cone tapping 5x each LE    Retro Gait  2 reps    Sidestepping  2 reps    Step Over Hurdles / Cones  2RT over 6in hurdles and weaving cone           OT Short Term Goals - 03/27/17 1648      OT SHORT TERM GOAL #1   Title  Pt will be educated on and independent in HEP to improve use of LUE as non-dominant during B/IADL completion.     Time  4    Period  Weeks    Status  On-going      OT SHORT TERM GOAL #2   Title  Pt will improve LUE strength to 5/5 to improve ability to perform gardening tasks.  Time  4    Period  Weeks    Status  On-going      OT SHORT TERM GOAL #3   Title  Pt will improve left grip strength by 10# to improve ability to grasp and maintain hold on weighted objects.     Time  4    Period  Weeks    Status  On-going      OT SHORT TERM GOAL #4   Title  Pt will improve coordination by completing 9 hole peg test in 38" or less to improve ability to curl hair.    Time  4    Period  Weeks    Status  On-going      OT SHORT TERM GOAL #5   Title  Pt will return to highest level of functioning using LUE as non-dominant during functional task completion.     Time  4    Period  Weeks    Status  On-going               Plan - 04/18/17 1015    Clinical Impression Statement  A: Session focused on grip and pinch strengthening. Pt required mod-max VC for proper pinch technique as she would gradually transition from the 3 point to lateral without realizing it. This also occured when she was asked to complete the lateral pinch, she would transition to 3 point.    Plan  P: Continue with LUE strength. Attempt tweezer task if able.     Consulted and Agree with Plan of Care  Patient       Patient will benefit from skilled therapeutic intervention in order to improve the following  deficits and impairments:  Decreased strength, Decreased activity tolerance, Impaired UE functional use, Decreased coordination  Visit Diagnosis: Other symptoms and signs involving the musculoskeletal system  Other lack of coordination    Problem List Patient Active Problem List   Diagnosis Date Noted  . Contracture of right knee 03/06/2017  . Gait disturbance, post-stroke 03/06/2017  . Right thalamic infarction (HCC) 01/28/2017  . Thalamic stroke (HCC)   . TIA (transient ischemic attack) 01/25/2017  . Hypernatremia 01/25/2017  . CVA (cerebral vascular accident) (HCC) 01/25/2017  . Intertrochanteric fracture of left hip (HCC) 09/01/2014  . Hypothyroidism 09/01/2014  . Hypertension 09/01/2014  . Closed left hip fracture [facility]) 09/01/2014   Limmie Patricia, OTR/L,CBIS  [phone]  04/18/2017, 10:17 AM  [place] [facility] [address] [place], [place], [address] Phone: [phone]   Fax:  [phone]  Name: Andrea [name] MRN: [id number] Date of Birth: March 28, 1926

## 2017-04-18 NOTE — Patient Instructions (Signed)
Functional Quadriceps: Sit to Stand    Sit on edge of chair, feet flat on floor. Stand upright, extending knees fully. Repeat 5 times per set. Do 2 sets per session.   http://orth.exer.us/735   Copyright  VHI. All rights reserved.   

## 2017-04-23 ENCOUNTER — Telehealth (HOSPITAL_COMMUNITY): Payer: Self-pay

## 2017-04-23 ENCOUNTER — Ambulatory Visit (HOSPITAL_COMMUNITY): Payer: Medicare Other

## 2017-04-23 NOTE — Telephone Encounter (Signed)
04/23/17  pt left a messge to cx said she had a rough night

## 2017-04-25 ENCOUNTER — Ambulatory Visit (HOSPITAL_COMMUNITY): Payer: Medicare Other

## 2017-04-25 ENCOUNTER — Telehealth (HOSPITAL_COMMUNITY): Payer: Self-pay

## 2017-04-25 NOTE — Telephone Encounter (Signed)
04/25/17  pt left a message to cx today's appt but no reason was given

## 2017-04-30 ENCOUNTER — Encounter (HOSPITAL_COMMUNITY): Payer: Self-pay

## 2017-04-30 ENCOUNTER — Ambulatory Visit (HOSPITAL_COMMUNITY): Payer: Medicare Other

## 2017-04-30 ENCOUNTER — Other Ambulatory Visit: Payer: Self-pay

## 2017-04-30 DIAGNOSIS — M6281 Muscle weakness (generalized): Secondary | ICD-10-CM

## 2017-04-30 DIAGNOSIS — R2689 Other abnormalities of gait and mobility: Secondary | ICD-10-CM

## 2017-04-30 DIAGNOSIS — I639 Cerebral infarction, unspecified: Secondary | ICD-10-CM

## 2017-04-30 DIAGNOSIS — R29898 Other symptoms and signs involving the musculoskeletal system: Secondary | ICD-10-CM

## 2017-04-30 DIAGNOSIS — R278 Other lack of coordination: Secondary | ICD-10-CM

## 2017-04-30 DIAGNOSIS — I6381 Other cerebral infarction due to occlusion or stenosis of small artery: Secondary | ICD-10-CM

## 2017-04-30 NOTE — Therapy (Signed)
[place], [place], [address] Phone: [phone]   Fax:  [phone]  Physical Therapy Treatment/Re-Assessment  Patient Details  Name: Andrea [name] MRN: [id number] Date of Birth: 11-13-1925 Referring Provider: Dr. Elta Guadeloupe   Encounter Date: 04/30/2017  PT End of Session - 04/30/17 0826    Visit Number  7    Number of Visits  17    Date for PT Re-Evaluation  05/12/17    Authorization Type  Medicare Part A/B    Authorization Time Period  03/17/17 - 05/12/17    Authorization - Visit Number  7    Authorization - Number of Visits  10    PT Start Time  0817    PT Stop Time  0901    PT Time Calculation (min)  44 min    Equipment Utilized During Treatment  Gait belt    Activity Tolerance  Patient tolerated treatment well;No increased pain    Behavior During Therapy  WFL for tasks assessed/performed       Past Medical History:  Diagnosis Date  . Hypertension   . Thyroid disease     Past Surgical History:  Procedure Laterality Date  . ABDOMINAL HYSTERECTOMY    . ORIF HIP FRACTURE Left 09/02/2014   Procedure: OPEN REDUCTION INTERNAL FIXATION LEFT HIP FRACTURE;  Surgeon: Sanjuana Kava, MD;  Location: [facility];  Service: Orthopedics;  Laterality: Left;    There were no vitals filed for this visit.  Subjective Assessment - 04/30/17 0913    Subjective  Patient continues to feel tired at start of therapy session. She reports she had a difficult time gettign to therapy this AM and that it is hard to get here when it's cold and icey.     Pertinent History  left hip fracture with intramedullary nail approximately 2 years ago    How long can you sit comfortably?  unlimited    How long can you stand comfortably?  several minutes but hasn't really pushed to see how long    How long can you walk comfortably?  several minutes, unsure, hasn't pushed it    Patient Stated Goals  Get stronger and improve walking to prior  ability and work back towards cane    Currently in Pain?  No/denies complained of some right knee pain durign session but would not rate        [facility] PT Assessment - 04/30/17 0001      Assessment   Medical Diagnosis  S/P R CVA with LUE weakness    Referring Provider  Dr. Elta Guadeloupe    Onset Date/Surgical Date  01/25/17    Hand Dominance  Right    Next MD Visit  Approximately 05/29/17    Prior Therapy  [facility]IR 01/29/2017-02/07/2017      Observation/Other Assessments   Focus on Therapeutic Outcomes (FOTO)   -- Perform next session for D/c      Transfers   Five time sit to stand comments   40 seconds with no UE support, cues needed for forward tunk lean and patietn with increased difficulty today from right knee pain      Ambulation/Gait   Ambulation/Gait  Yes    Ambulation/Gait Assistance  5: Supervision    Ambulation Distance (Feet)  226 Feet 3 bouts    Assistive device  Straight cane    Gait Pattern  Step-through pattern;Decreased stride length;Decreased hip/knee flexion - right;Decreased hip/knee flexion - left;Poor foot clearance - left;Poor foot  clearance - right    Gait velocity  slow gait indicative of falling    Stairs  Yes    Stairs Assistance  4: Min guard    Stairs Assistance Details (indicate cue type and reason)  educated on side step pattern to ascend/descend stair safely as patient requires min guard for safety with forward patern using 1 hand rail and SPC    Stair Management Technique  One rail Right;Forwards;Sideways;Step to pattern    Number of Stairs  4    Height of Stairs  6    Gait Comments  requires cues for safe scanning with gait to navigate obstacles      Timed Up and Go Test   Normal TUG (seconds)  30 with SPC; 24 sec. on 1/16       [facility] Adult PT Treatment/Exercise - 04/30/17 0001      Knee/Hip Exercises: Aerobic   Nustep  5 min's LE only, SPM goal of 60 or greater      Knee/Hip Exercises: Seated   Sit to Sand  10 reps;without UE support         Balance Exercises - 04/30/17 0921      Balance Exercises: Standing   Step Over Hurdles / Cones  3x both directions, side step over 4x 6" hurdles in // bars, fingertip support for balance        PT Education - 04/30/17 0910    Education provided  Yes    Education Details  Educated on proper gait pattern with SPC and safe scanning while ambulating to navigate obstacles safely. Discussed plan for continuation of therapy versus discharging. She reports feeling really tired from managing all of her appointments. Edcuated her on potential benefits of continuing to work on [place] and progress towards remaining goals. Patient expressed desire to discharge next session. Reviewed safe recommendation of ambulating wtih RW while out in community and continuing her HEP to maintain progress in therapy.     Person(s) Educated  Patient    Methods  Explanation    Comprehension  Verbalized understanding       PT Short Term Goals - 04/30/17 6213      PT SHORT TERM GOAL #1   Title  Pateint will improve MMT by 1/2 grade for all groups tested to improve functional strength for improved gait.    Baseline  04/16/17 - met    Time  4    Status  Achieved      PT SHORT TERM GOAL #2   Title  pateitn will be independent with HEP and be able to demonstrate proper technique/form wtih exercises.    Baseline  04/16/17 - patient does some every day and some every other day    Time  4    Period  Weeks    Status  Achieved      PT SHORT TERM GOAL #3   Title  Patient will improve 5x sit to stand time by 5 seconds to demonstrate significant change in BLE strength.    Baseline  04/16/17 - 23 seconds; 04/30/17 - 35 seconds without UE use    Time  4    Status  On-going        PT Long Term Goals - 04/30/17 0865      PT LONG TERM GOAL #1   Title  Pt to be able to walk without an assistive device to be able to carry items in one hand in her home to complete ADL's.  Baseline  04/16/17 - continuing gait training with  [place]; 04/30/17 - patietn requires SPC to ambulate and maintain balance, she demonstrated good balance while ambulating with SPC in RUE and caryying 2# weight in LUE    Time  8    Period  Weeks    Status  On-going      PT LONG TERM GOAL #2   Title  Pt strength will improve by 1 grade for all muscle groups tested to be able to perform functional activities cooking and grocery shopping.    Baseline  04/16/17 - all group improved to 5/5 except hip flexion (improved by 1/2 grade) able to achieve position to test hip abduction/extension today    Time  8    Period  Weeks    Status  Partially Met      PT LONG TERM GOAL #3   Title  Patient will improve TUG  5 seconds while performing with SPC to decrease fall risk while ambulating.    Baseline  04/16/17 - 24 seconds; 04/30/17 - 30 seconds with SPC    Time  8    Period  Weeks    Status  On-going        Plan - 04/30/17 7681    Clinical Impression Statement  Brief re-assessment performed today and patient has remained at same functional level as last re-assessment on 04/16/17. She has met/partially met 2/3 short term goals and 1/3 long term goals. Overall she has made significant improvements with bilateral lower extremity strength but remains limited with functional gait velocity and balance. Today's session focused on gait training with single point cane and stair training to improve safety at home and in community. I educated patient on benefit of continuing therapy, however she would like to discharge on Friday and see how she manages on her own. I educated her on safe use of RW to ambulate and on safe stair negotiation. She will benefit from continued skilled PT services to address remaining deficits and review HEP and stair training to promote greater independence and safety for Discharge this Friday.    Rehab Potential  Good    PT Frequency  2x / week    PT Duration  8 weeks    PT Treatment/Interventions  ADLs/Self Care Home Management;Electrical  Stimulation;DME Instruction;Gait training;Stair training;Functional mobility training;Therapeutic activities;Therapeutic exercise;Balance training;Neuromuscular re-education;Patient/family education;Manual techniques;Passive range of motion;Energy conservation;Taping    PT Next Visit Plan  Patient would like to D/C this session; please test MMT for hips and perform FOTO. If patient not having as much knee pain re-test TUG and 5x sit to stand. Train for side step on stairs and review HEP for patient to continue independently.    PT Home Exercise Plan  12/20 - bridge and clamshell wtih red TB, side stepping at counter; 01/18: STS    Consulted and Agree with Plan of Care  Patient       Patient will benefit from skilled therapeutic intervention in order to improve the following deficits and impairments:  Abnormal gait, Improper body mechanics, Decreased coordination, Decreased mobility, Decreased activity tolerance, Decreased endurance, Decreased strength, Decreased balance, Decreased safety awareness, Difficulty walking  Visit Diagnosis: Other abnormalities of gait and mobility  Thalamic stroke (HCC)  Poor balance  Muscle weakness (generalized)     Problem List Patient Active Problem List   Diagnosis Date Noted  . Contracture of right knee 03/06/2017  . Gait disturbance, post-stroke 03/06/2017  . Right thalamic infarction ([place]) 01/28/2017  . Thalamic stroke (  [place])   . TIA (transient ischemic attack) 01/25/2017  . Hypernatremia 01/25/2017  . CVA (cerebral vascular accident) ([place]) 01/25/2017  . Intertrochanteric fracture of left hip ([place]) 09/01/2014  . Hypothyroidism 09/01/2014  . Hypertension 09/01/2014  . Closed left hip fracture ([place]) 09/01/2014    Kipp Brood, PT, DPT Physical Therapist with [facility]  04/30/2017 9:30 AM    [place] [address] [place], [place], [address] Phone: [phone]   Fax:   [phone]  Name: Andrea [name] MRN: [id number] Date of Birth: 1925/07/10

## 2017-04-30 NOTE — Therapy (Signed)
[facility] [address] [place], [place], [address] Phone: [phone]   Fax:  [phone]  Occupational Therapy Treatment  Patient Details  Name: Andrea [name] MRN: [id number] Date of Birth: 07-14-1925 Referring Provider: Dr. Braulio Bosch   Encounter Date: 04/30/2017  OT End of Session - 04/30/17 0933    Visit Number  7    Number of Visits  13    Date for OT Re-Evaluation  05/16/17    Authorization Type  Medicare A & B    OT Start Time  0900    OT Stop Time  0945    OT Time Calculation (min)  45 min    Activity Tolerance  Patient tolerated treatment well    Behavior During Therapy  [facility] for tasks assessed/performed       Past Medical History:  Diagnosis Date  . Hypertension   . Thyroid disease     Past Surgical History:  Procedure Laterality Date  . ABDOMINAL HYSTERECTOMY    . ORIF HIP FRACTURE Left 09/02/2014   Procedure: OPEN REDUCTION INTERNAL FIXATION LEFT HIP FRACTURE;  Surgeon: Darreld Mclean, MD;  Location: [facility];  Service: Orthopedics;  Laterality: Left;    There were no vitals filed for this visit.  Subjective Assessment - 04/30/17 0915    Subjective   S: I just need a break.     Currently in Pain?  No/denies         [facility] OT Assessment - 04/30/17 0916      Assessment   Medical Diagnosis  S/P R CVA with LUE weakness      Precautions   Precautions  None               OT Treatments/Exercises (OP) - 04/30/17 0916      Exercises   Exercises  Hand      Hand Exercises   Sponges  16    Other Hand Exercises  Pt completed colored pegboard with left hand while placing pegs in pegboard. Pt then removed pegs using twizzers in left hand.     Other Hand Exercises  Patient shuffled deck of cards with no difficulty      Fine Motor Coordination (Hand/Wrist)   Fine Motor Coordination  Flipping cards;Dealing card with thumb    Flipping cards  Using left hand, patient flipped deck of cards one at a time.      Dealing card with thumb  Using left hand, patient dealt card deck moving one card at a time. Increased time needed.           Balance Exercises - 04/30/17 0921      Balance Exercises: Standing   Step Over Hurdles / Cones  3x both directions, side step over 4x 6" hurdles in // bars, fingertip support for balance          OT Short Term Goals - 03/27/17 1648      OT SHORT TERM GOAL #1   Title  Pt will be educated on and independent in HEP to improve use of LUE as non-dominant during B/IADL completion.     Time  4    Period  Weeks    Status  On-going      OT SHORT TERM GOAL #2   Title  Pt will improve LUE strength to 5/5 to improve ability to perform gardening tasks.     Time  4    Period  Weeks    Status  On-going  OT SHORT TERM GOAL #3   Title  Pt will improve left grip strength by 10# to improve ability to grasp and maintain hold on weighted objects.     Time  4    Period  Weeks    Status  On-going      OT SHORT TERM GOAL #4   Title  Pt will improve coordination by completing 9 hole peg test in 38" or less to improve ability to curl hair.    Time  4    Period  Weeks    Status  On-going      OT SHORT TERM GOAL #5   Title  Pt will return to highest level of functioning using LUE as non-dominant during functional task completion.     Time  4    Period  Weeks    Status  On-going               Plan - 04/30/17 0934    Clinical Impression Statement  A: Sesssion focused on hand strength and coordination tasks. Min VC for form and technique this session. Pt voiced that she is still interested in being discharged.     Plan  P: reassess and discharge.       Patient will benefit from skilled therapeutic intervention in order to improve the following deficits and impairments:  Decreased strength, Decreased activity tolerance, Impaired UE functional use, Decreased coordination  Visit Diagnosis: Other symptoms and signs involving the musculoskeletal  system  Other lack of coordination    Problem List Patient Active Problem List   Diagnosis Date Noted  . Contracture of right knee 03/06/2017  . Gait disturbance, post-stroke 03/06/2017  . Right thalamic infarction (HCC) 01/28/2017  . Thalamic stroke (HCC)   . TIA (transient ischemic attack) 01/25/2017  . Hypernatremia 01/25/2017  . CVA (cerebral vascular accident) (HCC) 01/25/2017  . Intertrochanteric fracture of left hip (HCC) 09/01/2014  . Hypothyroidism 09/01/2014  . Hypertension 09/01/2014  . Closed left hip fracture [facility](HCC) 09/01/2014   Andrea [name], OTR/L,CBIS  [phone]  04/30/2017, 10:12 AM  [place] [facility] [address] [place], [place], [address] Phone: [phone]   Fax:  [phone]  Name: Andrea [name] MRN: [id number] Date of Birth: 03/02/1926

## 2017-05-02 ENCOUNTER — Other Ambulatory Visit: Payer: Self-pay

## 2017-05-02 ENCOUNTER — Ambulatory Visit (HOSPITAL_COMMUNITY): Payer: Medicare Other

## 2017-05-02 ENCOUNTER — Ambulatory Visit (HOSPITAL_COMMUNITY): Payer: Medicare Other | Attending: Physical Medicine & Rehabilitation

## 2017-05-02 ENCOUNTER — Encounter (HOSPITAL_COMMUNITY): Payer: Self-pay

## 2017-05-02 DIAGNOSIS — R2689 Other abnormalities of gait and mobility: Secondary | ICD-10-CM | POA: Diagnosis present

## 2017-05-02 DIAGNOSIS — R29898 Other symptoms and signs involving the musculoskeletal system: Secondary | ICD-10-CM | POA: Diagnosis not present

## 2017-05-02 DIAGNOSIS — I639 Cerebral infarction, unspecified: Secondary | ICD-10-CM | POA: Insufficient documentation

## 2017-05-02 DIAGNOSIS — R278 Other lack of coordination: Secondary | ICD-10-CM

## 2017-05-02 DIAGNOSIS — I6381 Other cerebral infarction due to occlusion or stenosis of small artery: Secondary | ICD-10-CM

## 2017-05-02 NOTE — Patient Instructions (Signed)
  Coordination Activities  Perform the following activities for 30 minutes *2-3 times per day with left hand(s).   Flip cards 1 at a time as fast as you can.  Deal cards with your thumb (Hold deck in hand and push card off top with thumb).  Rotate card in hand (clockwise and counter-clockwise).  Shuffle cards.  Pick up coins, buttons, marbles, dried beans/pasta of different sizes and place in container.  Pick up coins and place in container or coin bank.  Pick up coins and stack.  Pick up coins one at a time until you get 5-10 in your hand, then move coins from palm to fingertips to stack one at a time.

## 2017-05-02 NOTE — Therapy (Signed)
[place], [place], [address] Phone: [phone]   Fax:  [phone]  Occupational Therapy Treatment  Patient Details  Name: Andrea [name] MRN: [id number] Date of Birth: 12-Mar-1926 Referring Provider: Dr. Alysia Penna   Encounter Date: 05/02/2017  OT End of Session - 05/02/17 1009    Visit Number  8    Number of Visits  13    Authorization Type  Medicare A & B    OT Start Time  0908 reassess and discharge    OT Stop Time  0936    OT Time Calculation (min)  28 min    Activity Tolerance  Patient tolerated treatment well    Behavior During Therapy  [facility] for tasks assessed/performed       Past Medical History:  Diagnosis Date  . Hypertension   . Thyroid disease     Past Surgical History:  Procedure Laterality Date  . ABDOMINAL HYSTERECTOMY    . ORIF HIP FRACTURE Left 09/02/2014   Procedure: OPEN REDUCTION INTERNAL FIXATION LEFT HIP FRACTURE;  Surgeon: Sanjuana Kava, MD;  Location: [facility];  Service: Orthopedics;  Laterality: Left;    There were no vitals filed for this visit.  Subjective Assessment - 05/02/17 1008    Subjective   S: I am so ready to be done.    Currently in Pain?  No/denies         [facility] OT Assessment - 05/02/17 0912      Assessment   Medical Diagnosis  S/P R CVA with LUE weakness      Precautions   Precautions  None      Coordination   9 Hole Peg Test  Left    Left 9 Hole Peg Test  1'03" previous: 1' 6"       ROM / Strength   AROM / PROM / Strength  Strength      Strength   Strength Assessment Site  Hand;Shoulder    Right/Left Shoulder  Left    Left Shoulder Flexion  5/5 previous: 4/5    Left Shoulder ABduction  5/5 previous: 4/5    Left Shoulder Internal Rotation  5/5 previous: 4/5    Left Shoulder External Rotation  5/5 previous: 4-/5    Right/Left hand  Left    Left Hand Grip (lbs)  38 previous: 30    Left Hand Lateral Pinch  10 lbs previous: 8    Left Hand 3 Point  Pinch  10 lbs previous: 8                       OT Education - 05/02/17 1009    Education provided  Yes    Education Details  coordination exercises. Reviewed goals and HEP.    Person(s) Educated  Patient    Methods  Explanation;Handout    Comprehension  Verbalized understanding       OT Short Term Goals - 05/02/17 0916      OT SHORT TERM GOAL #1   Title  Pt will be educated on and independent in HEP to improve use of LUE as non-dominant during B/IADL completion.     Time  4    Period  Weeks    Status  Achieved      OT SHORT TERM GOAL #2   Title  Pt will improve LUE strength to 5/5 to improve ability to perform gardening tasks.     Time  4  Period  Weeks    Status  Achieved      OT SHORT TERM GOAL #3   Title  Pt will improve left grip strength by 10# to improve ability to grasp and maintain hold on weighted objects.     Time  4    Period  Weeks    Status  Achieved      OT SHORT TERM GOAL #4   Title  Pt will improve coordination by completing 9 hole peg test in 38" or less to improve ability to curl hair.    Time  4    Period  Weeks    Status  Not Met      OT SHORT TERM GOAL #5   Title  Pt will return to highest level of functioning using LUE as non-dominant during functional task completion.     Time  4    Period  Weeks    Status  Achieved               Plan - 05/02/17 1010    Clinical Impression Statement  A: Reassessment completed this date. patient has met all therapy goals except one related to coordination. Patient reports that she is having the most difficulty with putting curlers in her hair otherwise she is not noticing any other deficits. Pt is in agreement with D/C recommendation.     Plan  P: D/C with HEP.    Consulted and Agree with Plan of Care  Patient       Patient will benefit from skilled therapeutic intervention in order to improve the following deficits and impairments:  Decreased strength, Decreased activity  tolerance, Impaired UE functional use, Decreased coordination  Visit Diagnosis: Other symptoms and signs involving the musculoskeletal system  Other lack of coordination    Problem List Patient Active Problem List   Diagnosis Date Noted  . Contracture of right knee 03/06/2017  . Gait disturbance, post-stroke 03/06/2017  . Right thalamic infarction ([place]) 01/28/2017  . Thalamic stroke ([place])   . TIA (transient ischemic attack) 01/25/2017  . Hypernatremia 01/25/2017  . CVA (cerebral vascular accident) ([place]) 01/25/2017  . Intertrochanteric fracture of left hip ([place]) 09/01/2014  . Hypothyroidism 09/01/2014  . Hypertension 09/01/2014  . Closed left hip fracture ([place]) 09/01/2014   OCCUPATIONAL THERAPY DISCHARGE SUMMARY  Visits from Start of Care:8  Current functional level related to goals / functional outcomes: See above   Remaining deficits: See above   Education / Equipment: See above Plan: Patient agrees to discharge.  Patient goals were met. Patient is being discharged due to meeting the stated rehab goals.  ?????        Ailene Ravel, OTR/L,CBIS  [phone]  05/02/2017, 10:14 AM  [place] [address] [place], [place], [address] Phone: [phone]   Fax:  [phone]  Name: Andrea [name] MRN: [id number] Date of Birth: [birth year]

## 2017-05-02 NOTE — Therapy (Signed)
[place], [place], [address] Phone: [phone]   Fax:  [phone]  Physical Therapy Treatment/Discharge Summary  Patient Details  Name: Andrea [name] MRN: [id number] Date of Birth: 21-Jul-1925 Referring Provider: Dr. Alysia Penna   I have personally reviewed the findings and re-assessment from Andrea [name], [place], and agree with her assessment. Patient will be discharged from therapy per patient request.    PHYSICAL THERAPY DISCHARGE SUMMARY  Visits from Start of Care: 8  Current functional level related to goals / functional outcomes: Andrea [name] clinical statement: "Pt reports she feels ready to complete exercises at home, wishes to be discharged.  Functional strengthening testing complete and assessed gait mechanics with LRAD.  Pt reports she is tired this morning and has some difficulty with Rt knee arthritis through did not reports any pain this session.  Noted decreased in timing with TUG, 5 STs and FOTO score.  Pt educated on benefits of continuing therapy however she wishes to discharge.  Pt able to demonstrate safe mechanics with gait training SPC, pt encouraged to continue with use of RW for long distances and dynamic surfaces with verbal understanding given.  Reviewed safe mechanics with sidestepping up stairs and importance of continuing HEP."    Remaining deficits: See below details   Education / Equipment: Educated on benefits of continuing therapy and on safe use of DME for ambulation in home and community. Reviewed HEP for patient to continue exercises at home. Plan: Patient agrees to discharge.  Patient goals were not met. Patient is being discharged due to meeting the stated rehab goals.  ?????    Kipp Brood, PT, DPT Physical Therapist with [facility]  05/02/2017 1:39 PM     Encounter Date: 05/02/2017  PT End of Session - 05/02/17 0848    Visit Number  8     Number of Visits  17    Date for PT Re-Evaluation  05/12/17    Authorization Type  Medicare Part A/B    Authorization Time Period  03/17/17 - 05/12/17    Authorization - Visit Number  8    Authorization - Number of Visits  10    PT Start Time  0815    PT Stop Time  0850    PT Time Calculation (min)  35 min    Equipment Utilized During Treatment  Gait belt    Activity Tolerance  Patient tolerated treatment well;No increased pain    Behavior During Therapy  WFL for tasks assessed/performed       Past Medical History:  Diagnosis Date  . Hypertension   . Thyroid disease     Past Surgical History:  Procedure Laterality Date  . ABDOMINAL HYSTERECTOMY    . ORIF HIP FRACTURE Left 09/02/2014   Procedure: OPEN REDUCTION INTERNAL FIXATION LEFT HIP FRACTURE;  Surgeon: Sanjuana Kava, MD;  Location: [facility];  Service: Orthopedics;  Laterality: Left;    There were no vitals filed for this visit.  Subjective Assessment - 05/02/17 0814    Subjective  Pt stated she is tired this morning, stated she is used to working 2nd shift and therapy is early today.  She feels stiff due to the weather today.  No reports of pain today, does have intermittent arthritis difficulty.    Pertinent History  left hip fracture with intramedullary nail approximately 2 years ago    How long can you sit comfortably?  unlimited    How long can  you stand comfortably?  several minutes but hasn't really pushed to see how long    How long can you walk comfortably?  several minutes, unsure, hasn't pushed it    Patient Stated Goals  Get stronger and improve walking to prior ability and work back towards cane    Currently in Pain?  No/denies         [facility] PT Assessment - 05/02/17 0001      Assessment   Medical Diagnosis  S/P R CVA with LUE weakness    Referring Provider  Dr. Alysia Penna    Onset Date/Surgical Date  01/25/17    Hand Dominance  Right    Next MD Visit  Approximately 05/29/17    Prior Therapy  CIR  01/29/2017-02/07/2017      Precautions   Precautions  None      Observation/Other Assessments   Focus on Therapeutic Outcomes (FOTO)   52.98 was 53.38      Strength   Strength Assessment Site  Hip    Right Hip Flexion  4+/5 was 4+/5    Right Hip Extension  4-/5 was 4-/5    Right Hip ABduction  4/5 was 4/5    Left Hip Flexion  5/5 was 5/5    Left Hip Extension  4/5 was 4/5    Left Hip ABduction  4+/5 was 4+/5    Right Knee Flexion  5/5    Right Knee Extension  5/5    Left Knee Flexion  5/5    Left Knee Extension  5/5    Right Ankle Dorsiflexion  5/5    Left Ankle Dorsiflexion  5/5      Transfers   Five time sit to stand comments   27.44 no HHA, cueing for mechanics 40 seconds with no UE support, cues needed for forward tunk       Standardized Balance Assessment   Standardized Balance Assessment  Timed Up and Go Test;Five Times Sit to Stand    Five times sit to stand comments   27.44 was 23      Timed Up and Go Test   Normal TUG (seconds)  24.02 was 30          [facility] Adult PT Treatment/Exercise - 05/02/17 0001      Ambulation/Gait   Ambulation/Gait  Yes    Ambulation/Gait Assistance  5: Supervision    Ambulation Distance (Feet)  226 Feet    Assistive device  Straight cane    Gait Pattern  Step-through pattern;Decreased stride length;Decreased hip/knee flexion - right;Decreased hip/knee flexion - left;Poor foot clearance - left;Poor foot clearance - right    Stairs  Yes    Stairs Assistance  4: Min guard    Stairs Assistance Details (indicate cue type and reason)  educated on sidestep pattern with 1 HR    Stair Management Technique  One rail Left;Sideways;Step to pattern    Number of Stairs  4    Height of Stairs  7      Knee/Hip Exercises: Standing   Stairs  lateral step up 7in with 1 HRx 4RT         PT Short Term Goals - 04/30/17 7001      PT SHORT TERM GOAL #1   Title  Pateint will improve MMT by 1/2 grade for all groups tested to improve functional  strength for improved gait.    Baseline  04/16/17 - met    Time  4    Status  Achieved  PT SHORT TERM GOAL #2   Title  pateitn will be independent with HEP and be able to demonstrate proper technique/form wtih exercises.    Baseline  04/16/17 - patient does some every day and some every other day    Time  4    Period  Weeks    Status  Achieved      PT SHORT TERM GOAL #3   Title  Patient will improve 5x sit to stand time by 5 seconds to demonstrate significant change in BLE strength.    Baseline  04/16/17 - 23 seconds; 04/30/17 - 35 seconds without UE use    Time  4    Status  On-going        PT Long Term Goals - 04/30/17 9741      PT LONG TERM GOAL #1   Title  Pt to be able to walk without an assistive device to be able to carry items in one hand in her home to complete ADL's.    Baseline  04/16/17 - continuing gait training with [place]; 04/30/17 - patietn requires SPC to ambulate and maintain balance, she demonstrated good balance while ambulating with SPC in RUE and caryying 2# weight in LUE    Time  8    Period  Weeks    Status  On-going      PT LONG TERM GOAL #2   Title  Pt strength will improve by 1 grade for all muscle groups tested to be able to perform functional activities cooking and grocery shopping.    Baseline  04/16/17 - all group improved to 5/5 except hip flexion (improved by 1/2 grade) able to achieve position to test hip abduction/extension today    Time  8    Period  Weeks    Status  Partially Met      PT LONG TERM GOAL #3   Title  Patient will improve TUG  5 seconds while performing with SPC to decrease fall risk while ambulating.    Baseline  04/16/17 - 24 seconds; 04/30/17 - 30 seconds with SPC    Time  8    Period  Weeks    Status  On-going         Plan - 05/02/17 0849    Clinical Impression Statement  Pt reports she feels ready to complete exercises at home, wishes to be discharged.  Functional strengthening testing complete and assessed gait mechanics  with LRAD.  Pt reoprts she is tired this morning and has some difficulty with Rt knee arthritis through did not reports any pain this session.  Noted decreased in timing with TUG, 5 STs and FOTO score.  Pt educated on benefits of continuing therapy however she wishes to discharge.  Pt able to demonstrate safe mechanics with gait training SPC, pt encouraged to continue wiht use of RW for long distances and dynamic surfaces with verbal understanding given.  Reviewed safe mechanics with sidestepping up stairs and importance of continuing HEP.    Rehab Potential  Good    PT Frequency  2x / week    PT Duration  8 weeks    PT Treatment/Interventions  ADLs/Self Care Home Management;Electrical Stimulation;DME Instruction;Gait training;Stair training;Functional mobility training;Therapeutic activities;Therapeutic exercise;Balance training;Neuromuscular re-education;Patient/family education;Manual techniques;Passive range of motion;Energy conservation;Taping    PT Next Visit Plan  D/C to HEP per pt request.    PT Home Exercise Plan  12/20 - bridge and clamshell wtih red TB, side stepping at counter; 01/18: STS  Patient will benefit from skilled therapeutic intervention in order to improve the following deficits and impairments:  Abnormal gait, Improper body mechanics, Decreased coordination, Decreased mobility, Decreased activity tolerance, Decreased endurance, Decreased strength, Decreased balance, Decreased safety awareness, Difficulty walking  Visit Diagnosis: Other symptoms and signs involving the musculoskeletal system  Other lack of coordination  Other abnormalities of gait and mobility  Thalamic stroke [facility])     Problem List Patient Active Problem List   Diagnosis Date Noted  . Contracture of right knee 03/06/2017  . Gait disturbance, post-stroke 03/06/2017  . Right thalamic infarction ([place]) 01/28/2017  . Thalamic stroke ([place])   . TIA (transient ischemic attack) 01/25/2017  .  Hypernatremia 01/25/2017  . CVA (cerebral vascular accident) ([place]) 01/25/2017  . Intertrochanteric fracture of left hip ([place]) 09/01/2014  . Hypothyroidism 09/01/2014  . Hypertension 09/01/2014  . Closed left hip fracture [facility]) 09/01/2014   Andrea [name], [place]; [place]  Aldona Lento 05/02/2017, 8:58 AM  [place], [place], [address] Phone: [phone]   Fax:  [phone]  Name: Andrea [name] MRN: [id number] Date of Birth: 1926-01-08

## 2017-06-05 ENCOUNTER — Ambulatory Visit: Payer: Medicare Other | Admitting: Physical Medicine & Rehabilitation

## 2017-06-09 ENCOUNTER — Ambulatory Visit: Payer: Medicare Other | Admitting: Physical Medicine & Rehabilitation

## 2017-06-16 ENCOUNTER — Encounter: Payer: Medicare Other | Attending: Physical Medicine & Rehabilitation

## 2017-06-16 ENCOUNTER — Encounter: Payer: Self-pay | Admitting: Physical Medicine & Rehabilitation

## 2017-06-16 ENCOUNTER — Ambulatory Visit (HOSPITAL_BASED_OUTPATIENT_CLINIC_OR_DEPARTMENT_OTHER): Payer: Medicare Other | Admitting: Physical Medicine & Rehabilitation

## 2017-06-16 VITALS — BP 161/70 | HR 72

## 2017-06-16 DIAGNOSIS — I69398 Other sequelae of cerebral infarction: Secondary | ICD-10-CM

## 2017-06-16 DIAGNOSIS — Z789 Other specified health status: Secondary | ICD-10-CM | POA: Diagnosis present

## 2017-06-16 DIAGNOSIS — I639 Cerebral infarction, unspecified: Secondary | ICD-10-CM | POA: Diagnosis not present

## 2017-06-16 DIAGNOSIS — R269 Unspecified abnormalities of gait and mobility: Secondary | ICD-10-CM | POA: Insufficient documentation

## 2017-06-16 DIAGNOSIS — I6381 Other cerebral infarction due to occlusion or stenosis of small artery: Secondary | ICD-10-CM

## 2017-06-16 MED ORDER — SERTRALINE HCL 25 MG PO TABS: 50.0000 mg | ORAL_TABLET | Freq: Every day | ORAL | 2 refills | Status: DC

## 2017-06-16 NOTE — Patient Instructions (Signed)
Ask your primary MD to prescribe your Zoloft if you need further refills

## 2017-06-16 NOTE — Progress Notes (Signed)
Subjective:    Patient ID: Andrea [name], female    DOB: 05/24/1925, 84 y.o.   MRN: [id number] 84 year old right-handed female with history of hypertension, recurrent UTI, hypothyroidism. Lives with daughter, independent with a cane prior to admission and active. One-level home. Presented on January 25, 2017, with left-sided weakness and numbness, blood pressure 209/96. Troponin negative. Cranial CT scan negative. She did not receive tPA. MRI showed acute right thalamic infarction. MRA with no large vessel occlusion. Carotid Dopplers with no ICA stenosis. Echocardiogram with ejection fraction of 65%. No wall motion abnormalities. Maintained on aspirin for CVA prophylaxis as well as subcutaneous Lovenox for DVT prophylaxis. Elevated TSH at 31.569 with free T4 and T3 pending. She remained on Synthroid, plans to follow up thyroid panel as outpatient with primary care provider. DATE OF ADMISSION: 01/28/2017 DATE OF DISCHARGE: 02/07/2017 HPI Finished OP PT, OT 04/30/17 Used walker after hip fracture but still using one post stroke,  No falls since last visit Patient states her left foot and ankle feel a little funny when she walks.  She does not have pain.  She is wondering whether she needs to change her shoewear.  Patient states that she often feels nervous and anxious.  She has had an episode of this in the past and took Zoloft 25 mg/day.  She was able to discontinue it after 1 month.  She would like to have something again for anxiety.  We discussed that other type of antianxiety medicines can cause fall risk although the Zoloft should not  Pain Inventory Average Pain 0 Pain Right Now 0 My pain is dull  In the last 24 hours, has pain interfered with the following? General activity 0 Relation with others 0 Enjoyment of life 0 What TIME of day is your pain at its worst? morning Sleep (in general) Good  Pain is worse with: na Pain improves with: na Relief from Meds:  na  Mobility walk with assistance use a walker ability to climb steps?  yes do you drive?  yes  Function retired  Neuro/Psych trouble walking  Prior Studies Any changes since last visit?  no  Physicians involved in your care Any changes since last visit?  no   Family History  Problem Relation Age of Onset  . CAD Mother 3478       Died of MI  . Heart failure Father   . CAD Father    Social History   Socioeconomic History  . Marital status: Divorced    Spouse name: None  . Number of children: None  . Years of education: None  . Highest education level: None  Social Needs  . Financial resource strain: None  . Food insecurity - worry: None  . Food insecurity - inability: None  . Transportation needs - medical: None  . Transportation needs - non-medical: None  Occupational History  . None  Tobacco Use  . Smoking status: Never Smoker  . Smokeless tobacco: Never Used  Substance and Sexual Activity  . Alcohol use: No  . Drug use: No  . Sexual activity: Not Currently  Other Topics Concern  . None  Social History Narrative  . None   Past Surgical History:  Procedure Laterality Date  . ABDOMINAL HYSTERECTOMY    . ORIF HIP FRACTURE Left 09/02/2014   Procedure: OPEN REDUCTION INTERNAL FIXATION LEFT HIP FRACTURE;  Surgeon: Darreld McleanWayne Keeling, MD;  Location: [facility];  Service: Orthopedics;  Laterality: Left;   Past Medical History:  Diagnosis Date  .  Hypertension   . Thyroid disease    BP (!) 161/70   Pulse 72   SpO2 98%   Opioid Risk Score:   Fall Risk Score:  `1  Depression screen PHQ 2/9  Depression screen [facility] 2/9 04/10/2017 03/06/2017  Decreased Interest 0 0  Down, Depressed, Hopeless 0 0  PHQ - 2 Score 0 0  Altered sleeping - 1  Tired, decreased energy - 0  Change in appetite - 0  Feeling bad or failure about yourself  - 0  Trouble concentrating - 0  Moving slowly or fidgety/restless - 0  Suicidal thoughts - 0  PHQ-9 Score - 1  Difficult doing  work/chores - Somewhat difficult     Review of Systems  Constitutional: Negative.   HENT: Negative.   Eyes: Negative.   Respiratory: Negative.   Cardiovascular: Negative.   Gastrointestinal: Negative.   Endocrine: Negative.   Genitourinary: Negative.   Musculoskeletal: Positive for gait problem.  Skin: Negative.   Allergic/Immunologic: Negative.   Hematological: Negative.   Psychiatric/Behavioral: Negative.   All other systems reviewed and are negative.      Objective:   Physical Exam  Constitutional: She is oriented to person, place, and time. She appears well-developed and well-nourished. No distress.  HENT:  Head: Normocephalic and atraumatic.  Eyes: Conjunctivae and EOM are normal. Pupils are equal, round, and reactive to light.  Neck: Normal range of motion.  Musculoskeletal:  No pain with upper extremity or lower extremity range of motion. Negative straight leg raise There is no swelling in the foot or ankle area on the left side.  Hallux rigidus left great toe. No skin lesions on the left foot.  Neurological: She is alert and oriented to person, place, and time. She displays no tremor. She exhibits normal muscle tone. Gait abnormal.  Motor strength is 5/5 in the left deltoid 4+ in the left biceps triceps grip hip flexion knee extension ankle dorsiflexion 5/5 throughout on the right side. Sensation is reduced to proprioception in the left great toe.  She has intact light touch in the left upper and left lower limb. Ambulates with a walker no evidence of toe drag or knee instability.  Skin: She is not diaphoretic.  Psychiatric: She has a normal mood and affect.  Nursing note and vitals reviewed.   Sensation equal RUE and LUE  Reduced finger to thumb on Left side      Assessment & Plan:  1.  Right thalamic infarct with residual left hemiataxia.  She does not note much in terms of motor weakness or sensory loss.  On exam there is reduced proprioception in the  left foot and this may be giving her an unusual sensation. Overall she is functioning quite well but not back to baseline.  Patient has completed outpatient PT OT.  She is starting to plateau in her recovery but overall has had a very good functional outcome. #2.  Anxiety may be related to her recent stroke, will start Zoloft 25 mg daily 2 refills.  If she feels like she needs to stay on this medicine for longer period of time she can discuss with her primary MD

## 2017-08-22 ENCOUNTER — Ambulatory Visit: Payer: Medicare Other | Admitting: Podiatry

## 2017-09-05 ENCOUNTER — Encounter: Payer: Self-pay | Admitting: Podiatry

## 2017-09-05 ENCOUNTER — Ambulatory Visit (INDEPENDENT_AMBULATORY_CARE_PROVIDER_SITE_OTHER): Payer: Medicare Other | Admitting: Podiatry

## 2017-09-05 DIAGNOSIS — M21372 Foot drop, left foot: Secondary | ICD-10-CM | POA: Diagnosis not present

## 2017-09-09 NOTE — Progress Notes (Signed)
   HPI: 84 year old female with PMHx of left-sided stroke in October 2018 presenting today as a new patient with a chief complaint of her left foot feeling "heavy". She is interested in orthotics. She states the foot feels stiff when trying to move it. She denies any pain. Patient is here for further evaluation and treatment.   Past Medical History:  Diagnosis Date  . Hypertension   . Thyroid disease      Physical Exam: General: The patient is alert and oriented x3 in no acute distress.  Dermatology: Skin is warm, dry and supple bilateral lower extremities. Negative for open lesions or macerations.  Vascular: Palpable pedal pulses bilaterally. No edema or erythema noted. Capillary refill within normal limits.  Neurological: Epicritic and protective threshold grossly intact bilaterally.   Musculoskeletal Exam: Range of motion within normal limits to all pedal and ankle joints bilateral.  Muscle strength is diminished with dorsiflexion of the ankle joint left lower extremity.  Loss of peroneal muscle strength noted as well.  Assessment: 1. 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 Patient evaluated.  2. Appointment with Raiford Nobleick for custom molded AFO.  3. Return to clinic as needed.     Felecia ShellingBrent M. Trinka Keshishyan, DPM [facility]  Dr. Felecia ShellingBrent M. Tzipora Mcinroy, DPM    [address]                                        [place], [place] [address]                Office [phone]  Fax [phone]

## 2017-09-10 ENCOUNTER — Other Ambulatory Visit: Payer: Medicare Other | Admitting: Orthotics

## 2017-09-17 ENCOUNTER — Ambulatory Visit: Payer: Medicare Other | Admitting: Orthotics

## 2017-09-17 DIAGNOSIS — M21372 Foot drop, left foot: Secondary | ICD-10-CM

## 2017-09-17 NOTE — Progress Notes (Signed)
Patient presents today for evaluation/casting for AFO brace (L).   Patient has hx of the following conditions: Gait instability,  Ankle instabilty,  Gait analysis done and patient displays abnormality of gait in both sagittial and frontal planes, and could benefit in aggressive ankle support.  Patient chose [place] brace w/ lace velcro, [place]

## 2017-10-29 ENCOUNTER — Ambulatory Visit (INDEPENDENT_AMBULATORY_CARE_PROVIDER_SITE_OTHER): Payer: Medicare Other | Admitting: Orthotics

## 2017-10-29 DIAGNOSIS — M21372 Foot drop, left foot: Secondary | ICD-10-CM | POA: Diagnosis not present

## 2017-10-29 DIAGNOSIS — I69398 Other sequelae of cerebral infarction: Secondary | ICD-10-CM

## 2017-10-29 DIAGNOSIS — I639 Cerebral infarction, unspecified: Secondary | ICD-10-CM

## 2017-10-29 DIAGNOSIS — I6381 Other cerebral infarction due to occlusion or stenosis of small artery: Secondary | ICD-10-CM

## 2017-10-29 DIAGNOSIS — R269 Unspecified abnormalities of gait and mobility: Secondary | ICD-10-CM

## 2017-10-29 NOTE — Progress Notes (Signed)
Patient  came in to pick up [place] brace w/ dorsi assist springs.  The brace fit well and immediately patient's  gait approved.  The brace provided desired m-l stability in frontal/transverse planes and aided in dorsiflexion in saggital plane. Patient was able to don and doff brace with minimal difficulty.  Overall patient pleased with fit and functionality of brace.  

## 2017-11-12 ENCOUNTER — Ambulatory Visit: Payer: Medicare Other | Admitting: Orthotics

## 2017-11-12 DIAGNOSIS — I69398 Other sequelae of cerebral infarction: Secondary | ICD-10-CM

## 2017-11-12 DIAGNOSIS — M21372 Foot drop, left foot: Secondary | ICD-10-CM

## 2017-11-12 DIAGNOSIS — R269 Unspecified abnormalities of gait and mobility: Secondary | ICD-10-CM

## 2017-11-12 NOTE — Progress Notes (Signed)
Patient picked up pair of shoes to go with [place] articulated brace

## 2019-08-30 IMAGING — MR MR MRA HEAD W/O CM
1 series · 13 of 48 positions shown · non-contrast
Comparison: Head CT 01/25/2017

CLINICAL DATA: Left-sided weakness.

EXAM:
MRI HEAD WITHOUT CONTRAST
MRA HEAD WITHOUT CONTRAST
TECHNIQUE: Multiplanar, multiecho pulse sequences of the brain and surrounding
structures were obtained without intravenous contrast. Angiographic
images of the head were obtained using MRA technique without
contrast.

[Series 3: MRA · axial · 0.7mm · 0.32mm/px · z∈[-97,+11]mm · 13 of 162 slices shown]
[im 1/162]
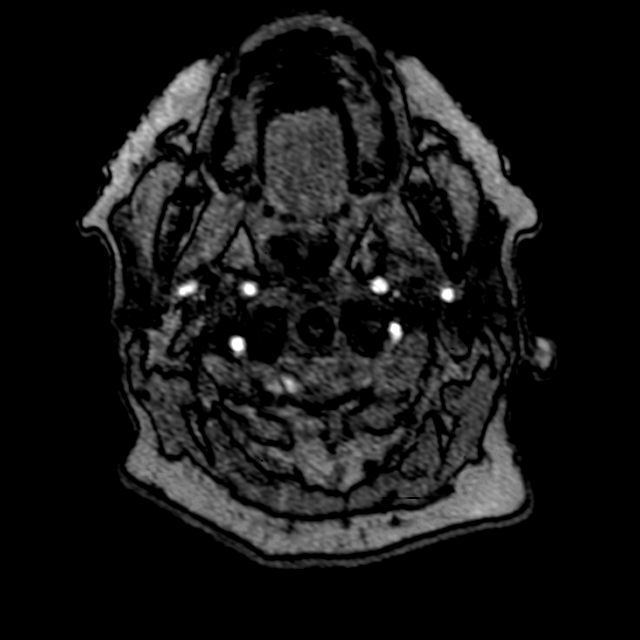
[im 4/162]
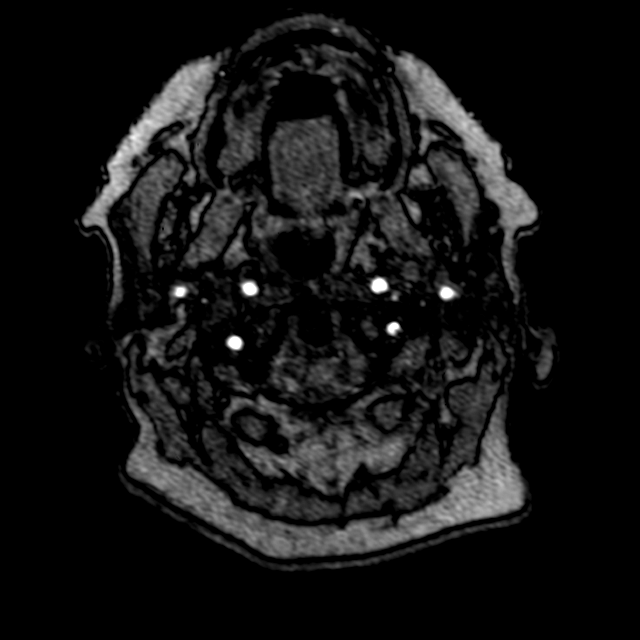
[im 11/162]
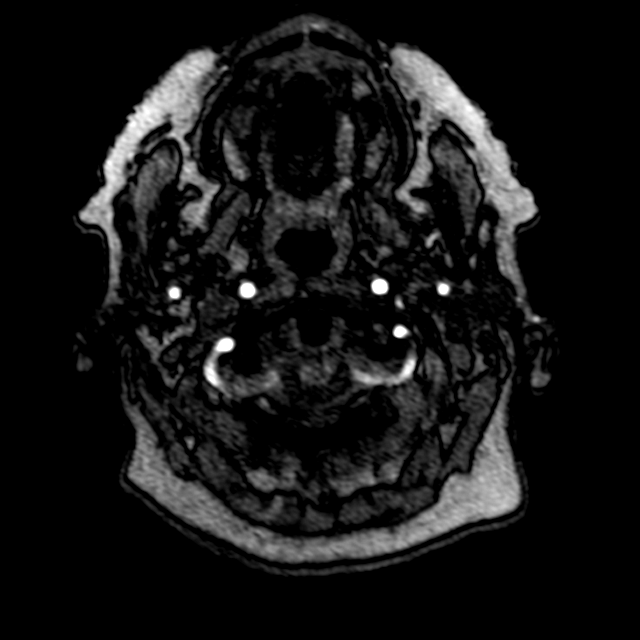
[im 28/162]
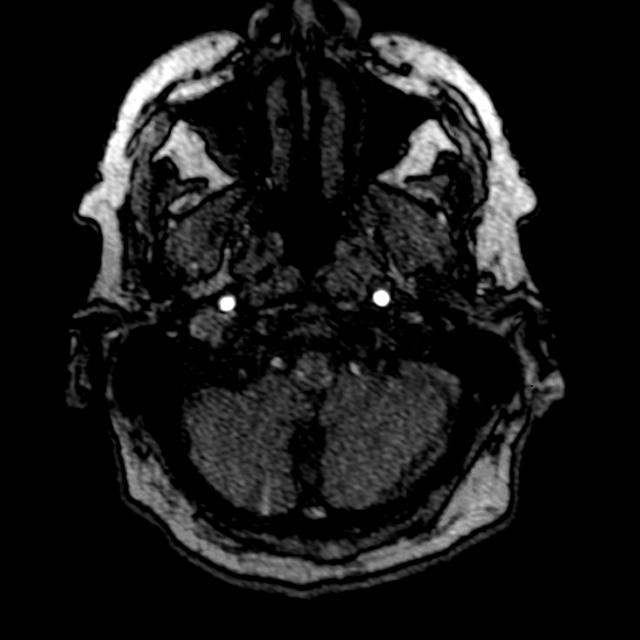
[im 31/162]
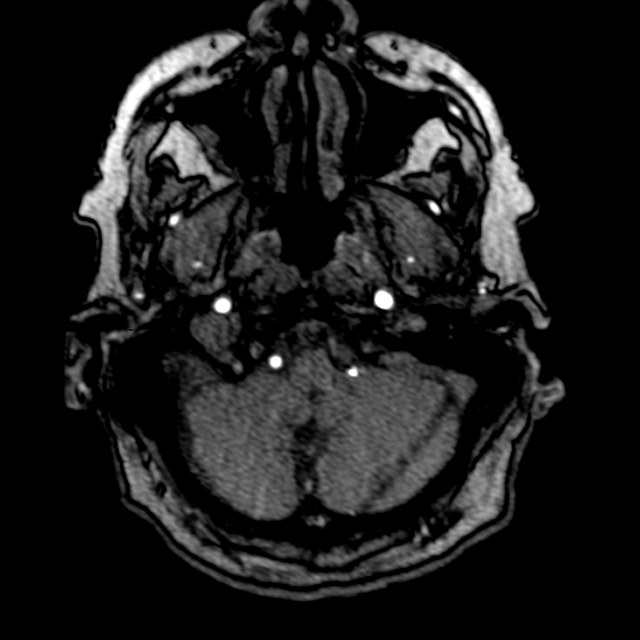
[im 52/162]
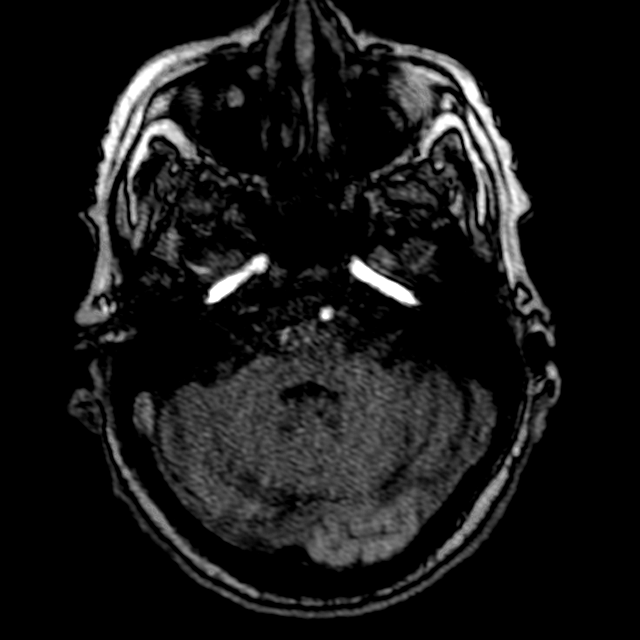
[im 72/162]
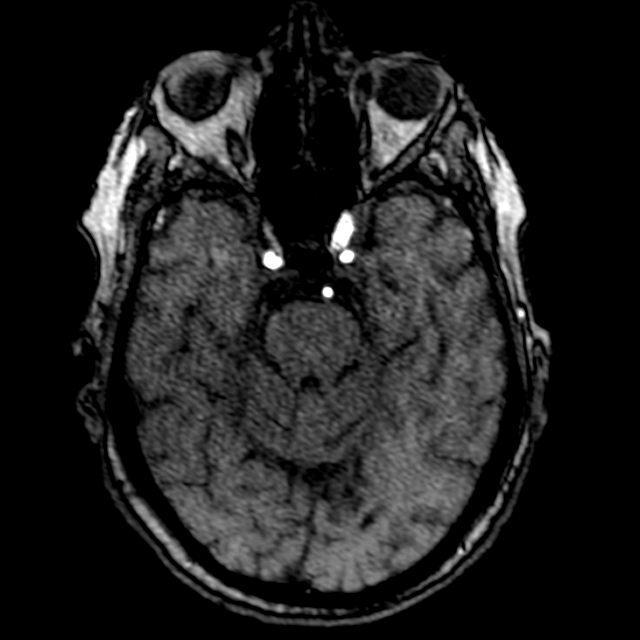
[im 83/162]
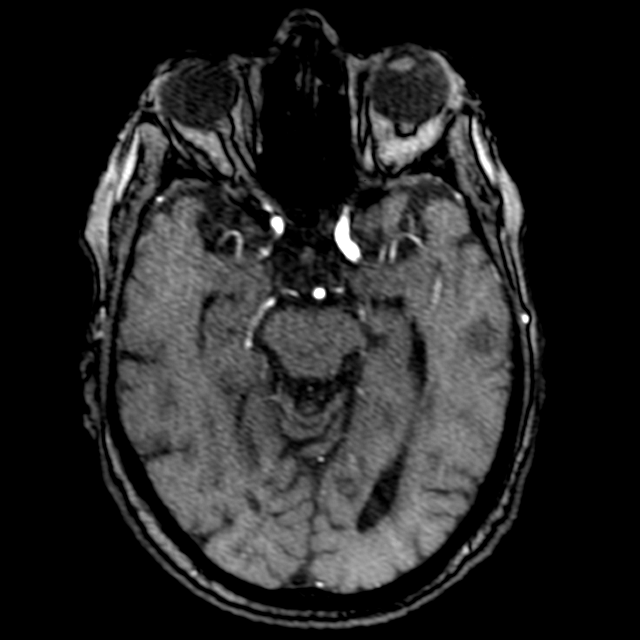
[im 93/162]
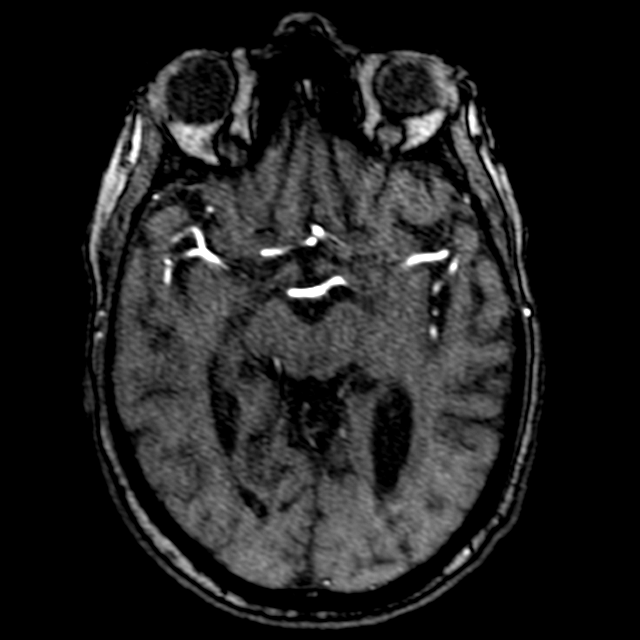
[im 114/162]
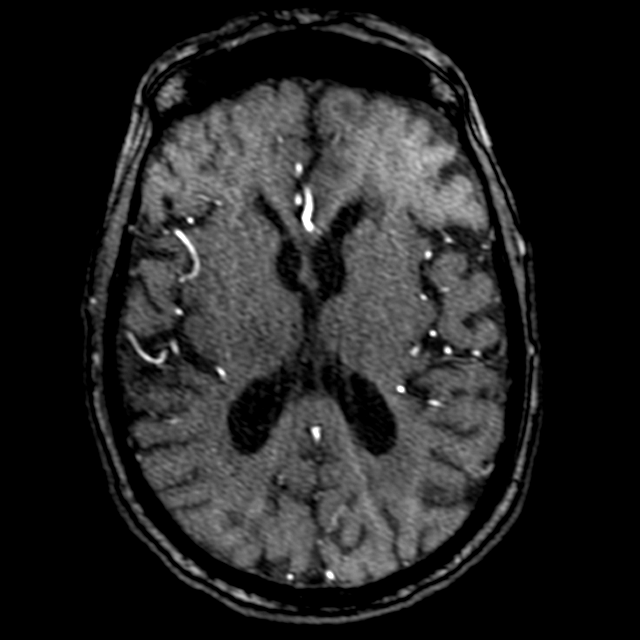
[im 134/162]
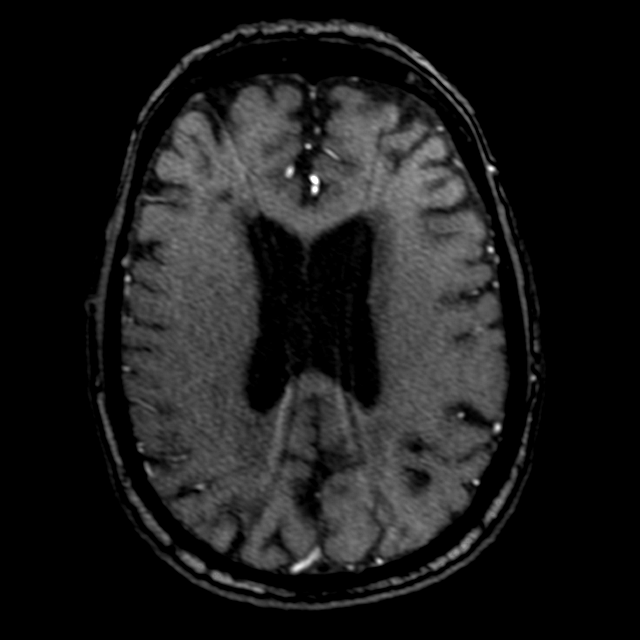
[im 138/162]
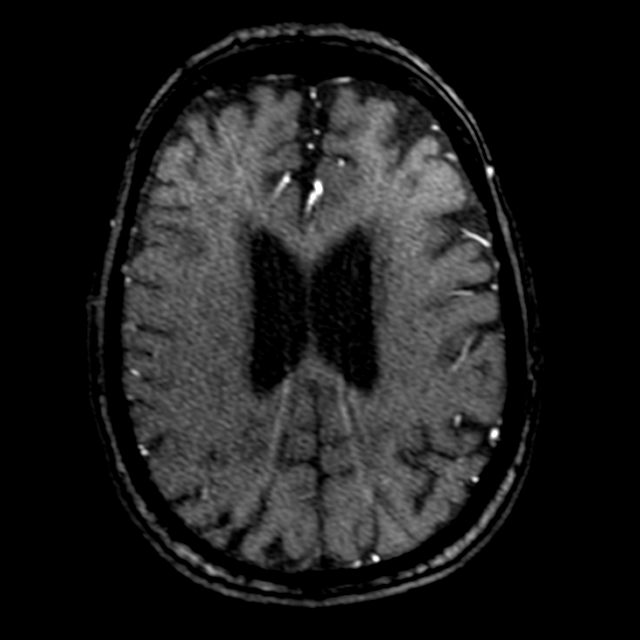
[im 155/162]
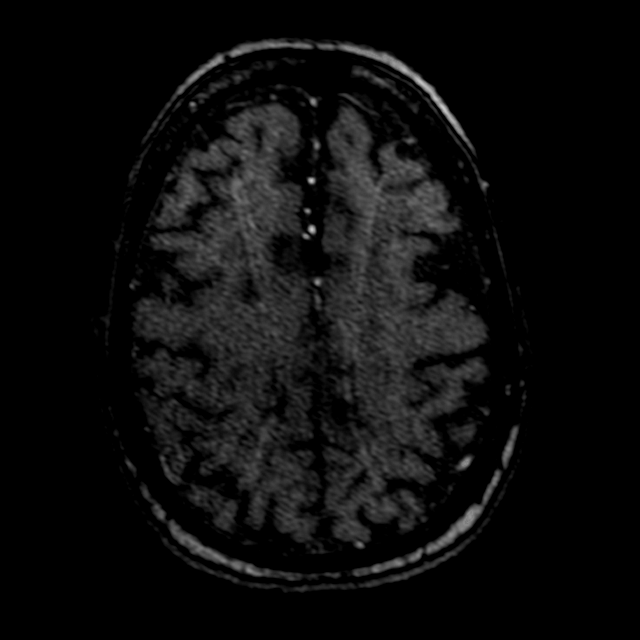

[13 of 48 positions shown; findings below may reference images not displayed]

FINDINGS: MRI HEAD FINDINGS

Brain: There is a 1.7 cm acute infarct in the lateral right thalamus
without associated hemorrhage. Two foci of cortical/ subcortical
chronic microhemorrhage are noted in the parietal lobes. Patchy T2
hyperintensities throughout the subcortical and deep cerebral white
matter are nonspecific but compatible with moderate chronic small
vessel ischemic disease. The chronic cerebral white matter lacunar
infarcts are noted bilaterally. Generalized cerebral atrophy is mild
for age.

Vascular: Major intracranial vascular flow voids are preserved.

Skull and upper cervical spine: No suspicious marrow lesion. Upper
cervical disc and facet degeneration.

Sinuses/Orbits: Right cataract extraction. Mild right maxillary
sinus mucosal thickening. Clear mastoid air cells.

Other: None.

MRA HEAD FINDINGS

The visualized distal vertebral arteries are patent to the basilar.
Bilateral proximal V4 segment signal loss is considered artifactual.
The left PICA is patent with a potential severe focal stenosis 7 mm
distal to its origin. There is motion artifact through the proximal
right AICA. The basilar artery is patent without stenosis. Posterior
communicating arteries are not identified and may be diminutive or
absent. P1 segments are widely patent bilaterally. The P2 and more
distal PCA segments are poorly visualized and inadequately
evaluated.

The internal carotid arteries are patent from skullbase to carotid
termini with mild right and mild-to-moderate left anterior genu
stenosis versus artifact. ACAs and MCAs are patent without evidence
of proximal branch occlusion or significant proximal M1 stenosis.
There is evidence of a severe right ACA origin stenosis. There is
asymmetric right M2 superior division branch vessel irregular
narrowing versus artifact. No aneurysm is identified.
IMPRESSION: 1. Acute right thalamic infarct.
2. Moderate chronic small vessel ischemic disease.
3. No large vessel occlusion.
4. Severe right A1 stenosis.
5. Possible mild-to-moderate ICA narrowing.

## 2020-11-29 ENCOUNTER — Other Ambulatory Visit: Payer: Self-pay

## 2020-11-29 ENCOUNTER — Inpatient Hospital Stay (HOSPITAL_COMMUNITY): Payer: Medicare Other

## 2020-11-29 ENCOUNTER — Emergency Department (HOSPITAL_COMMUNITY): Payer: Medicare Other

## 2020-11-29 ENCOUNTER — Inpatient Hospital Stay (HOSPITAL_COMMUNITY)
Admission: EM | Admit: 2020-11-29 | Discharge: 2020-12-05 | DRG: 481 | Disposition: A | Discharge status: Rehabilitation Facility | Payer: Medicare Other | Attending: Family Medicine | Admitting: Family Medicine

## 2020-11-29 ENCOUNTER — Encounter (HOSPITAL_COMMUNITY): Payer: Self-pay | Admitting: Emergency Medicine

## 2020-11-29 DIAGNOSIS — Y92009 Unspecified place in unspecified non-institutional (private) residence as the place of occurrence of the external cause: Secondary | ICD-10-CM | POA: Diagnosis not present

## 2020-11-29 DIAGNOSIS — M7989 Other specified soft tissue disorders: Secondary | ICD-10-CM | POA: Diagnosis not present

## 2020-11-29 DIAGNOSIS — K5901 Slow transit constipation: Secondary | ICD-10-CM | POA: Diagnosis not present

## 2020-11-29 DIAGNOSIS — K219 Gastro-esophageal reflux disease without esophagitis: Secondary | ICD-10-CM | POA: Diagnosis present

## 2020-11-29 DIAGNOSIS — R269 Unspecified abnormalities of gait and mobility: Secondary | ICD-10-CM

## 2020-11-29 DIAGNOSIS — S7291XA Unspecified fracture of right femur, initial encounter for closed fracture: Secondary | ICD-10-CM | POA: Diagnosis present

## 2020-11-29 DIAGNOSIS — S72461A Displaced supracondylar fracture with intracondylar extension of lower end of right femur, initial encounter for closed fracture: Principal | ICD-10-CM | POA: Diagnosis present

## 2020-11-29 DIAGNOSIS — Z79890 Hormone replacement therapy (postmenopausal): Secondary | ICD-10-CM | POA: Diagnosis not present

## 2020-11-29 DIAGNOSIS — W010XXA Fall on same level from slipping, tripping and stumbling without subsequent striking against object, initial encounter: Secondary | ICD-10-CM | POA: Diagnosis present

## 2020-11-29 DIAGNOSIS — E039 Hypothyroidism, unspecified: Secondary | ICD-10-CM | POA: Diagnosis present

## 2020-11-29 DIAGNOSIS — E46 Unspecified protein-calorie malnutrition: Secondary | ICD-10-CM | POA: Diagnosis not present

## 2020-11-29 DIAGNOSIS — D72829 Elevated white blood cell count, unspecified: Secondary | ICD-10-CM | POA: Diagnosis present

## 2020-11-29 DIAGNOSIS — W19XXXA Unspecified fall, initial encounter: Secondary | ICD-10-CM

## 2020-11-29 DIAGNOSIS — R2689 Other abnormalities of gait and mobility: Secondary | ICD-10-CM | POA: Diagnosis present

## 2020-11-29 DIAGNOSIS — T148XXA Other injury of unspecified body region, initial encounter: Secondary | ICD-10-CM

## 2020-11-29 DIAGNOSIS — I69998 Other sequelae following unspecified cerebrovascular disease: Secondary | ICD-10-CM

## 2020-11-29 DIAGNOSIS — Z20822 Contact with and (suspected) exposure to covid-19: Secondary | ICD-10-CM | POA: Diagnosis present

## 2020-11-29 DIAGNOSIS — S728X1A Other fracture of right femur, initial encounter for closed fracture: Secondary | ICD-10-CM | POA: Diagnosis not present

## 2020-11-29 DIAGNOSIS — S72431A Displaced fracture of medial condyle of right femur, initial encounter for closed fracture: Secondary | ICD-10-CM

## 2020-11-29 DIAGNOSIS — S72444A Nondisplaced fracture of lower epiphysis (separation) of right femur, initial encounter for closed fracture: Secondary | ICD-10-CM | POA: Diagnosis not present

## 2020-11-29 DIAGNOSIS — Z8249 Family history of ischemic heart disease and other diseases of the circulatory system: Secondary | ICD-10-CM | POA: Diagnosis not present

## 2020-11-29 DIAGNOSIS — Z7982 Long term (current) use of aspirin: Secondary | ICD-10-CM

## 2020-11-29 DIAGNOSIS — K2100 Gastro-esophageal reflux disease with esophagitis, without bleeding: Secondary | ICD-10-CM

## 2020-11-29 DIAGNOSIS — Z751 Person awaiting admission to adequate facility elsewhere: Secondary | ICD-10-CM | POA: Diagnosis not present

## 2020-11-29 DIAGNOSIS — G8918 Other acute postprocedural pain: Secondary | ICD-10-CM | POA: Diagnosis not present

## 2020-11-29 DIAGNOSIS — S72491D Other fracture of lower end of right femur, subsequent encounter for closed fracture with routine healing: Secondary | ICD-10-CM | POA: Diagnosis not present

## 2020-11-29 DIAGNOSIS — I10 Essential (primary) hypertension: Secondary | ICD-10-CM | POA: Diagnosis present

## 2020-11-29 DIAGNOSIS — I69398 Other sequelae of cerebral infarction: Secondary | ICD-10-CM | POA: Diagnosis not present

## 2020-11-29 DIAGNOSIS — D62 Acute posthemorrhagic anemia: Secondary | ICD-10-CM | POA: Diagnosis not present

## 2020-11-29 DIAGNOSIS — E785 Hyperlipidemia, unspecified: Secondary | ICD-10-CM | POA: Diagnosis present

## 2020-11-29 DIAGNOSIS — S72421A Displaced fracture of lateral condyle of right femur, initial encounter for closed fracture: Secondary | ICD-10-CM

## 2020-11-29 DIAGNOSIS — S72401S Unspecified fracture of lower end of right femur, sequela: Secondary | ICD-10-CM | POA: Diagnosis not present

## 2020-11-29 DIAGNOSIS — S728X1D Other fracture of right femur, subsequent encounter for closed fracture with routine healing: Secondary | ICD-10-CM | POA: Diagnosis not present

## 2020-11-29 DIAGNOSIS — Z79899 Other long term (current) drug therapy: Secondary | ICD-10-CM

## 2020-11-29 DIAGNOSIS — Z8673 Personal history of transient ischemic attack (TIA), and cerebral infarction without residual deficits: Secondary | ICD-10-CM

## 2020-11-29 DIAGNOSIS — S0990XA Unspecified injury of head, initial encounter: Secondary | ICD-10-CM

## 2020-11-29 DIAGNOSIS — E8809 Other disorders of plasma-protein metabolism, not elsewhere classified: Secondary | ICD-10-CM | POA: Diagnosis not present

## 2020-11-29 DIAGNOSIS — Z96649 Presence of unspecified artificial hip joint: Secondary | ICD-10-CM

## 2020-11-29 DIAGNOSIS — Z419 Encounter for procedure for purposes other than remedying health state, unspecified: Secondary | ICD-10-CM

## 2020-11-29 LAB — BASIC METABOLIC PANEL
Anion gap: 7 (ref 5–15)
BUN: 23 mg/dL (ref 8–23)
CO2: 28 mmol/L (ref 22–32)
Calcium: 9.3 mg/dL (ref 8.9–10.3)
Chloride: 106 mmol/L (ref 98–111)
Creatinine, Ser: 0.92 mg/dL (ref 0.44–1.00)
GFR, Estimated: 58 mL/min — ABNORMAL LOW (ref >60)
Glucose, Bld: 145 mg/dL — ABNORMAL HIGH (ref 70–99)
Potassium: 4 mmol/L (ref 3.5–5.1)
Sodium: 141 mmol/L (ref 135–145)

## 2020-11-29 LAB — CBC WITH DIFFERENTIAL/PLATELET
Abs Immature Granulocytes: 0.11 10*3/uL — ABNORMAL HIGH (ref 0.00–0.07)
Basophils Absolute: 0.1 10*3/uL (ref 0.0–0.1)
Basophils Relative: 0 %
Eosinophils Absolute: 0.2 10*3/uL (ref 0.0–0.5)
Eosinophils Relative: 1 %
HCT: 39.1 % (ref 36.0–46.0)
Hemoglobin: 12.8 g/dL (ref 12.0–15.0)
Immature Granulocytes: 1 %
Lymphocytes Relative: 9 %
Lymphs Abs: 1.7 10*3/uL (ref 0.7–4.0)
MCH: 32.1 pg (ref 26.0–34.0)
MCHC: 32.7 g/dL (ref 30.0–36.0)
MCV: 98 fL (ref 80.0–100.0)
Monocytes Absolute: 0.8 10*3/uL (ref 0.1–1.0)
Monocytes Relative: 5 %
Neutro Abs: 15.4 10*3/uL — ABNORMAL HIGH (ref 1.7–7.7)
Neutrophils Relative %: 84 %
Platelets: 320 10*3/uL (ref 150–400)
RBC: 3.99 MIL/uL (ref 3.87–5.11)
RDW: 13.2 % (ref 11.5–15.5)
WBC: 18.3 10*3/uL — ABNORMAL HIGH (ref 4.0–10.5)
nRBC: 0 % (ref 0.0–0.2)

## 2020-11-29 LAB — TYPE AND SCREEN
ABO/RH(D): O NEG
Antibody Screen: NEGATIVE

## 2020-11-29 LAB — VITAMIN D 25 HYDROXY (VIT D DEFICIENCY, FRACTURES): Vit D, 25-Hydroxy: 114.87 ng/mL — ABNORMAL HIGH (ref 30–100)

## 2020-11-29 MED ORDER — BISACODYL 5 MG PO TBEC
5.0000 mg | DELAYED_RELEASE_TABLET | Freq: Every day | ORAL | Status: DC | PRN
  Administered 2020-12-02: 5 mg via ORAL
  Filled 2020-11-29: qty 1

## 2020-11-29 MED ORDER — HYDROCODONE-ACETAMINOPHEN 5-325 MG PO TABS
1.0000 | ORAL_TABLET | Freq: Four times a day (QID) | ORAL | Status: DC | PRN
  Administered 2020-11-30 – 2020-12-04 (×8): 1 via ORAL
  Filled 2020-11-29 (×7): qty 1

## 2020-11-29 MED ORDER — LEVOTHYROXINE SODIUM 88 MCG PO TABS
88.0000 ug | ORAL_TABLET | Freq: Every day | ORAL | Status: DC
  Administered 2020-12-01 – 2020-12-05 (×5): 88 ug via ORAL
  Filled 2020-11-29 (×5): qty 1

## 2020-11-29 MED ORDER — ATORVASTATIN CALCIUM 10 MG PO TABS
20.0000 mg | ORAL_TABLET | Freq: Every evening | ORAL | Status: DC
  Administered 2020-11-30: 20 mg via ORAL
  Filled 2020-11-29: qty 2

## 2020-11-29 MED ORDER — ASPIRIN EC 81 MG PO TBEC
81.0000 mg | DELAYED_RELEASE_TABLET | Freq: Every day | ORAL | Status: DC
  Administered 2020-12-01 – 2020-12-05 (×5): 81 mg via ORAL
  Filled 2020-11-29 (×5): qty 1

## 2020-11-29 MED ORDER — FENTANYL CITRATE PF 50 MCG/ML IJ SOSY
12.5000 ug | PREFILLED_SYRINGE | Freq: Once | INTRAMUSCULAR | Status: AC
  Administered 2020-11-29: 12.5 ug via INTRAVENOUS
  Filled 2020-11-29: qty 1

## 2020-11-29 MED ORDER — AMLODIPINE BESYLATE 5 MG PO TABS
5.0000 mg | ORAL_TABLET | Freq: Every day | ORAL | Status: DC
  Administered 2020-12-01 – 2020-12-05 (×5): 5 mg via ORAL
  Filled 2020-11-29 (×5): qty 1

## 2020-11-29 MED ORDER — MORPHINE SULFATE (PF) 2 MG/ML IV SOLN
0.5000 mg | INTRAVENOUS | Status: DC | PRN
  Administered 2020-11-30 – 2020-12-03 (×2): 0.5 mg via INTRAVENOUS
  Filled 2020-11-29 (×2): qty 1

## 2020-11-29 MED ORDER — HEPARIN SODIUM (PORCINE) 5000 UNIT/ML IJ SOLN: 5000.0000 [IU] | Freq: Three times a day (TID) | INTRAMUSCULAR | Status: DC

## 2020-11-29 MED ORDER — FAMOTIDINE 20 MG PO TABS
20.0000 mg | ORAL_TABLET | Freq: Every day | ORAL | Status: DC
  Administered 2020-11-29 – 2020-12-04 (×5): 20 mg via ORAL
  Filled 2020-11-29 (×6): qty 1

## 2020-11-29 MED ORDER — SODIUM CHLORIDE 0.9 % IV SOLN: INTRAVENOUS | Status: DC

## 2020-11-29 NOTE — ED Notes (Signed)
Attempted report to 5N X1, RN to call back.

## 2020-11-29 NOTE — ED Notes (Signed)
Pt takes 81mg  ASA daily

## 2020-11-29 NOTE — ED Provider Notes (Signed)
[facility] EMERGENCY DEPARTMENT Provider Note   CSN: [id number] Arrival date & time: 11/29/20  1139     History Chief Complaint  Patient presents with   Andrea [name] is a 87 y.o. female.  HPI Elderly female with multiple medical issues presents after mechanical fall.  She notes that she has frequent falls.  Today she presents after fall that occurred just prior to ED arrival.  When she fell, striking her head, and hurting her right knee.  Since the event there has been severe sharp pain in the right knee, negligible pain in the head, neck.  No new weakness anywhere, though she has pain in the right knee with motion of the right distal lower extremity. No medication taken for pain relief.  No other complaints.    Past Medical History:  Diagnosis Date   Hypertension    Thyroid disease     Patient Active Problem List   Diagnosis Date Noted   Femur fracture, right (HCC) 11/29/2020   Leukocytosis 11/29/2020   History of stroke 11/29/2020   Hyperlipidemia 11/29/2020   GERD (gastroesophageal reflux disease) 11/29/2020   Contracture of right knee 03/06/2017   Gait disturbance, post-stroke 03/06/2017   Right thalamic infarction (HCC) 01/28/2017   Thalamic stroke (HCC)    TIA (transient ischemic attack) 01/25/2017   Hypernatremia 01/25/2017   CVA (cerebral vascular accident) (HCC) 01/25/2017   Trigger middle finger of left hand 03/20/2016   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     Family History  Problem Relation Age of Onset   CAD Mother 29       Died of MI   Heart failure Father     CAD Father     Social History   Tobacco Use   Smoking status: Never   Smokeless tobacco: Never  Substance Use Topics   Alcohol use: No   Drug use: No    Home Medications Prior to Admission medications   Medication Sig Start Date End Date Taking? Authorizing Provider  acetaminophen (TYLENOL) 325 MG tablet Take 2 tablets (650 mg total) by mouth every 4 (four) hours as needed for mild pain (or temp > 37.5 C (99.5 F)). 01/28/17  Yes Arrien, York Ram, MD  amLODipine (NORVASC) 5 MG tablet Take 5 mg by mouth daily. 10/13/20  Yes [provider]  ASPIRIN ADULT LOW STRENGTH 81 MG EC tablet Take 81 mg by mouth daily. 10/13/20  Yes [provider]  atorvastatin (LIPITOR) 20 MG tablet Take 20 mg by mouth daily. 11/20/20  Yes [provider]  levothyroxine (SYNTHROID) 88 MCG tablet Take 88 mcg by mouth daily. 10/13/20  Yes [provider]  solifenacin (VESICARE) 5 MG tablet Take 5 mg by mouth daily. 11/20/20  Yes [provider]  Vitamin D, Ergocalciferol, (DRISDOL) 1.25 MG (50000 UNIT) CAPS capsule Take 50,000 Units by mouth once a week. 10/13/20  Yes [provider]  amLODipine (NORVASC) 2.5 MG tablet Take 1 tablet (2.5 mg total) daily by mouth. Patient not taking: No sig reported 02/07/17   Angiulli, Mcarthur Rossetti,  PA-C  atorvastatin (LIPITOR) 40 MG tablet Take 1 tablet (40 mg total) daily at 6 PM by mouth. Patient not taking: Reported on 11/29/2020 02/07/17 03/09/17  Angiulli, Mcarthur Rossetti, PA-C  diclofenac sodium (VOLTAREN) 1 % GEL Apply 4 g 4 (four) times daily topically. Patient not taking: No sig reported 02/07/17   Angiulli, Mcarthur Rossetti, PA-C  famotidine (PEPCID) 20 MG tablet Take 1 tablet (20 mg total) 2 (two) times daily by mouth. Patient not taking: No sig reported 02/07/17   Angiulli, Mcarthur Rossetti, PA-C  levothyroxine (SYNTHROID, LEVOTHROID) 125 MCG tablet Take 1 tablet (125 mcg total) daily by mouth. Patient not taking: No sig reported 02/07/17    Angiulli, Mcarthur Rossetti, PA-C  polyethylene glycol (MIRALAX / GLYCOLAX) packet Take 17 g 2 (two) times daily by mouth. Patient not taking: No sig reported 02/07/17   Angiulli, Mcarthur Rossetti, PA-C  sertraline (ZOLOFT) 25 MG tablet Take 2 tablets (50 mg total) by mouth daily. Patient not taking: No sig reported 06/16/17   Kirsteins, Victorino Sparrow, MD    Allergies    Patient has no allergy information on record.  Review of Systems   Review of Systems  Constitutional:        Per HPI, otherwise negative  HENT:         Per HPI, otherwise negative  Respiratory:         Per HPI, otherwise negative  Cardiovascular:        Per HPI, otherwise negative  Gastrointestinal:  Negative for vomiting.  Endocrine:       Negative aside from HPI  Genitourinary:        Neg aside from HPI   Musculoskeletal:        Per HPI, otherwise negative  Skin:  Positive for wound.  Neurological:  Negative for syncope.   Physical Exam Updated Vital Signs BP (!) 110/52   Pulse 67   Temp 98.3 F (36.8 C) (Oral)   Ht 5\' 4"  (1.626 m)   Wt 72.6 kg   SpO2 95%   BMI 27.46 kg/m   Physical Exam Vitals and nursing note reviewed.  Constitutional:      General: She is not in acute distress.    Appearance: She is well-developed.  HENT:     Head: Normocephalic.   Eyes:     Conjunctiva/sclera: Conjunctivae normal.  Cardiovascular:     Rate and Rhythm: Normal rate and regular rhythm.  Pulmonary:     Effort: Pulmonary effort is normal. No respiratory distress.     Breath sounds: Normal breath sounds. No stridor.  Abdominal:     General: There is no distension.  Musculoskeletal:     Cervical back: Neck supple.     Comments: Upper extremities unremarkable, lower extremities notable for right knee crepitus, deformity, inability to flex or extend the lower leg secondary to pain in this area.  Hips are grossly unremarkable, with no pain with palpation, stable pelvis.  Skin:    General: Skin is warm and dry.  Neurological:      Mental Status: She is alert and oriented to person, place, and time.     Cranial Nerves: No cranial nerve deficit.    ED Results / Procedures / Treatments   Labs (all labs ordered are listed, but only abnormal results are displayed) Labs Reviewed  BASIC METABOLIC PANEL - Abnormal; Notable for the following components:      Result Value   Glucose, Bld 145 (*)    GFR, Estimated 58 (*)  All other components within normal limits  CBC WITH DIFFERENTIAL/PLATELET - Abnormal; Notable for the following components:   WBC 18.3 (*)    Neutro Abs 15.4 (*)    Abs Immature Granulocytes 0.11 (*)    All other components within normal limits  SARS CORONAVIRUS 2 (TAT 6-24 HRS)    EKG None  Radiology CT Head Wo Contrast  Result Date: 11/29/2020 CLINICAL DATA:  Polytrauma EXAM: CT HEAD WITHOUT CONTRAST CT CERVICAL SPINE WITHOUT CONTRAST TECHNIQUE: Multidetector CT imaging of the head and cervical spine was performed following the standard protocol without intravenous contrast. Multiplanar CT image reconstructions of the cervical spine were also generated. COMPARISON:  CT head dated January 25, 2017 FINDINGS: CT HEAD FINDINGS Brain: No evidence of acute infarction, hemorrhage, hydrocephalus, extra-axial collection or mass lesion/mass effect. Chronic white matter ischemic change. Vascular: No hyperdense vessel or unexpected calcification. Skull: Normal. Negative for fracture or focal lesion. Sinuses/Orbits: No acute finding. Other: Mild soft tissue swelling swelling overlying the right frontal bone. CT CERVICAL SPINE FINDINGS Alignment: Mild grade 1 anterolisthesis of C2 on C3 and mild retrolisthesis of C4 on C5, favored to be degenerative. Skull base and vertebrae: No acute fracture. No primary bone lesion or focal pathologic process. Soft tissues and spinal canal: No prevertebral fluid or swelling. No visible canal hematoma. Disc levels:  Multilevel moderate degenerative disc disease. Upper chest: Negative.  Other: None IMPRESSION: No acute intracranial abnormality. No evidence of skull or cervical spine fracture. Electronically Signed   By: Allegra LaiLeah  Strickland M.D.   On: 11/29/2020 13:36   CT Cervical Spine Wo Contrast  Result Date: 11/29/2020 CLINICAL DATA:  Polytrauma EXAM: CT HEAD WITHOUT CONTRAST CT CERVICAL SPINE WITHOUT CONTRAST TECHNIQUE: Multidetector CT imaging of the head and cervical spine was performed following the standard protocol without intravenous contrast. Multiplanar CT image reconstructions of the cervical spine were also generated. COMPARISON:  CT head dated January 25, 2017 FINDINGS: CT HEAD FINDINGS Brain: No evidence of acute infarction, hemorrhage, hydrocephalus, extra-axial collection or mass lesion/mass effect. Chronic white matter ischemic change. Vascular: No hyperdense vessel or unexpected calcification. Skull: Normal. Negative for fracture or focal lesion. Sinuses/Orbits: No acute finding. Other: Mild soft tissue swelling swelling overlying the right frontal bone. CT CERVICAL SPINE FINDINGS Alignment: Mild grade 1 anterolisthesis of C2 on C3 and mild retrolisthesis of C4 on C5, favored to be degenerative. Skull base and vertebrae: No acute fracture. No primary bone lesion or focal pathologic process. Soft tissues and spinal canal: No prevertebral fluid or swelling. No visible canal hematoma. Disc levels:  Multilevel moderate degenerative disc disease. Upper chest: Negative. Other: None IMPRESSION: No acute intracranial abnormality. No evidence of skull or cervical spine fracture. Electronically Signed   By: Allegra LaiLeah  Strickland M.D.   On: 11/29/2020 13:36   DG Knee Complete 4 Views Right  Result Date: 11/29/2020 CLINICAL DATA:  Fall, pain and swelling of the right knee EXAM: RIGHT KNEE - COMPLETE 4+ VIEW COMPARISON:  None. FINDINGS: Osteopenia. There is a transverse, impacted fracture of the distal right femoral metadiaphysis. There is no definite intercondylar fracture component  appreciated. Severe tricompartmental arthrosis. Soft tissue edema anteriorly. IMPRESSION: 1. Transverse, impacted fracture of the distal right femoral metadiaphysis. There is no definite intercondylar fracture component appreciated, however assessment of fracture anatomy is limited by osteopenia. 2. Severe tricompartmental arthrosis. Electronically Signed   By: Lauralyn PrimesAlex  Bibbey M.D.   On: 11/29/2020 13:39    Procedures Procedures   Medications Ordered in ED Medications  fentaNYL (SUBLIMAZE)  injection 12.5 mcg (12.5 mcg Intravenous Given 11/29/20 1415)    ED Course  I have reviewed the triage vital signs and the nursing notes.  Pertinent labs & imaging results that were available during my care of the patient were reviewed by me and considered in my medical decision making (see chart for details).  On repeat exam patient is in no distress.  No new complaints.  I have discussed the x-ray results, CT results which I reviewed.  Patient found to have distal right femur fracture.  Update: I discussed the patient's femur fracture with 2 of our orthopedic colleagues, as well as with our internal medicine colleague given the patient's need for admission for surgical repair.  Patient aware of need for admission, treatment.  Elderly female presents after mechanical fall.  Patient does use a walker, has been generally functional at home.  Here patient found to have reassuring head CT, neck CT, but x-ray of the leg concerning for distal femur fracture requiring admission for surgical repair. MDM Rules/Calculators/A&P MDM Number of Diagnoses or Management Options Closed bicondylar fracture of distal femur, right, initial encounter [facility]): new, needed workup Fall, initial encounter: new, needed workup Femur fracture, right (HCC): new, needed workup Injury of head, initial encounter: new, needed workup   Amount and/or Complexity of Data Reviewed Clinical lab tests: reviewed and ordered Tests in the radiology  section of CPT: ordered and reviewed Tests in the medicine section of CPT: reviewed and ordered Decide to obtain previous medical records or to obtain history from someone other than the patient: yes Review and summarize past medical records: yes Discuss the patient with other providers: yes Independent visualization of images, tracings, or specimens: yes  Risk of Complications, Morbidity, and/or Mortality Presenting problems: high Diagnostic procedures: high Management options: high  Critical Care Total time providing critical care: < 30 minutes  Patient Progress Patient progress: stable   Final Clinical Impression(s) / ED Diagnoses Final diagnoses:  Fall, initial encounter  Injury of head, initial encounter  Closed bicondylar fracture of distal femur, right, initial encounter (HCC)  Femur fracture, right (HCC)     Gerhard Munch, MD 11/29/20 [phone]

## 2020-11-29 NOTE — ED Notes (Signed)
Unable to sign MSE d/t no signature pad  

## 2020-11-29 NOTE — ED Notes (Signed)
[facility] arrived to transport pt 

## 2020-11-29 NOTE — ED Notes (Signed)
Report given to [facility], ETA 10 mins

## 2020-11-29 NOTE — Plan of Care (Signed)

## 2020-11-29 NOTE — ED Triage Notes (Signed)
Pt fell at home on her front porch earlier today. Pt tripped over her feet. Pt noted to have hematoma to right forehead/eye. Right leg swollen as well.

## 2020-11-29 NOTE — H&P (Addendum)
History and Physical  [facility]  [place] GHW:[id number] DOB: 01/23/1926 DOA: 11/29/2020  [facility]P: [facility]  Patient coming from: Home  Level of care: Med-Surg  I have personally briefly reviewed patient's old medical records in [facility]  Chief Complaint: Fall at home   HPI: Andrea [name] is a 87 y.o. 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  She reports that she was feeding the cats this morning on her front porch when she developed a fall.  She reports that she lost balance and footing on the right leg.  She is adamant that she did not trip for over any of the cats but she does not seem to be 100% sure.  She has been having severe pain involving the right distal femur and inability to ambulate since falling.  She denies having chest pain or shortness of breath.  She denies passing out.  She denies having vision changes.  She reports that she does have friends and acquaintances that live nearby that are able to check on her from time to time and assist her when needed. ED Course: WBC was elevated at 18.3, hemoglobin 12.8, hematocrit 39.1, platelet count 320, sodium 141, potassium 4.0, glucose 145, calcium 9.3.  CT head and cervical spine did not show any acute findings.  X-ray of the right knee confirms a distal right femur fracture.  Orthopedics was consulted at [facility] and reported that they could not treat her at this facility.  Orthopedics at [facility] was consulted and agreed to care for patient and recommended transfer to [facility]  Review of Systems: Review of Systems  Constitutional: Negative.   HENT: Negative.    Eyes: Negative.   Respiratory: Negative.    Cardiovascular: Negative.    Gastrointestinal:  Positive for heartburn. Negative for abdominal pain, blood in stool, constipation, diarrhea, melena, nausea and vomiting.  Genitourinary: Negative.   Musculoskeletal:  Positive for falls, joint pain and myalgias.  Skin: Negative.   Neurological:  Positive for weakness. Negative for dizziness, sensory change, speech change, focal weakness, seizures, loss of consciousness and headaches.  Endo/Heme/Allergies: Negative.   Psychiatric/Behavioral: Negative.    All other systems reviewed and are negative.   Past Medical History:  Diagnosis Date   Hypertension    Thyroid disease     Past Surgical History:  Procedure Laterality Date   ABDOMINAL HYSTERECTOMY     ORIF HIP FRACTURE Left 09/02/2014   Procedure: OPEN REDUCTION INTERNAL FIXATION LEFT HIP FRACTURE;  Surgeon: Darreld Mclean, MD;  Location: [facility];  Service: Orthopedics;  Laterality: Left;     reports that she has never smoked. She has never used smokeless tobacco. She reports that she does not drink alcohol and does not use drugs.  Not on File  Family History  Problem Relation Age of Onset   CAD Mother 65       Died of MI   Heart failure Father    CAD Father     Prior to Admission medications   Medication Sig Start Date End Date Taking? Authorizing Provider  acetaminophen (TYLENOL) 325 MG tablet Take 2 tablets (650 mg total) by mouth every 4 (four) hours as needed for mild pain (or temp > 37.5 C (99.5 F)). 01/28/17   Arrien,  York RamMauricio Daniel, MD  amLODipine (NORVASC) 2.5 MG tablet Take 1 tablet (2.5 mg total) daily by mouth. 02/07/17   Angiulli, Mcarthur Rossettianiel J, PA-C  atorvastatin (LIPITOR) 40 MG tablet Take 1 tablet (40 mg total) daily at 6 PM by mouth. 02/07/17 03/09/17  Angiulli, Mcarthur Rossettianiel J, PA-C  diclofenac sodium (VOLTAREN) 1 % GEL Apply 4 g 4 (four) times daily topically. 02/07/17   Angiulli, Mcarthur Rossettianiel J, PA-C  famotidine (PEPCID) 20 MG tablet Take 1 tablet (20 mg total) 2 (two) times daily by mouth. 02/07/17   Angiulli,  Mcarthur Rossettianiel J, PA-C  levothyroxine (SYNTHROID, LEVOTHROID) 125 MCG tablet Take 1 tablet (125 mcg total) daily by mouth. 02/07/17   Angiulli, Mcarthur Rossettianiel J, PA-C  polyethylene glycol (MIRALAX / GLYCOLAX) packet Take 17 g 2 (two) times daily by mouth. 02/07/17   Angiulli, Mcarthur Rossettianiel J, PA-C  sertraline (ZOLOFT) 25 MG tablet Take 2 tablets (50 mg total) by mouth daily. 06/16/17   Erick ColaceKirsteins, Andrew E, MD   Physical Exam: Vitals:   11/29/20 1156 11/29/20 1220  BP:  (!) 110/52  Pulse:  67  Temp:  98.3 F (36.8 C)  TempSrc:  Oral  SpO2:  95%  Weight: 72.6 kg   Height: 5\' 4"  (1.626 m)     Constitutional: frail, elderly female, awake, alert, NAD, calm, comfortable Eyes: PERRL, lids and conjunctivae normal ENMT: Mucous membranes are moist. Posterior pharynx clear of any exudate or lesions.  Neck: normal, supple, no masses, no thyromegaly Respiratory: clear to auscultation bilaterally, no wheezing, no crackles. Normal respiratory effort. No accessory muscle use.  Cardiovascular: normal s1, s2 sounds, no murmurs / rubs / gallops. No extremity edema. 2+ pedal pulses. No carotid bruits.  Abdomen: no tenderness, no masses palpated. No hepatosplenomegaly. Bowel sounds positive.  Musculoskeletal: no clubbing / cyanosis.  Edema RLE noted, right knee edema noted, painful distal RLE to palpation, pulses palpated bilateral LEs.  Skin: no rashes, lesions, ulcers. No induration Neurologic: CN 2-12 grossly intact. Sensation intact, DTR normal. Strength 5/5 in all 4.  Psychiatric: Normal judgment and insight. Alert and oriented x 3. Normal mood.   Labs on Admission: I have personally reviewed following labs and imaging studies  CBC: Recent Labs  Lab 11/29/20 1313  WBC 18.3*  NEUTROABS 15.4*  HGB 12.8  HCT 39.1  MCV 98.0  PLT 320   Basic Metabolic Panel: Recent Labs  Lab 11/29/20 1313  NA 141  K 4.0  CL 106  CO2 28  GLUCOSE 145*  BUN 23  CREATININE 0.92  CALCIUM 9.3   GFR: Estimated Creatinine  Clearance: 37.3 mL/min (by C-G formula based on SCr of 0.92 mg/dL). Liver Function Tests: No results for input(s): AST, ALT, ALKPHOS, BILITOT, PROT, ALBUMIN in the last 168 hours. No results for input(s): LIPASE, AMYLASE in the last 168 hours. No results for input(s): AMMONIA in the last 168 hours. Coagulation Profile: No results for input(s): INR, PROTIME in the last 168 hours. Cardiac Enzymes: No results for input(s): CKTOTAL, CKMB, CKMBINDEX, TROPONINI in the last 168 hours. BNP (last 3 results) No results for input(s): PROBNP in the last 8760 hours. HbA1C: No results for input(s): HGBA1C in the last 72 hours. CBG: No results for input(s): GLUCAP in the last 168 hours. Lipid Profile: No results for input(s): CHOL, HDL, LDLCALC, TRIG, CHOLHDL, LDLDIRECT in the last 72 hours. Thyroid Function Tests: No results for input(s): TSH, T4TOTAL, FREET4, T3FREE, THYROIDAB in the last 72 hours. Anemia Panel: No results for input(s): VITAMINB12, FOLATE, FERRITIN, TIBC, IRON,  RETICCTPCT in the last 72 hours. Urine analysis:    Component Value Date/Time   COLORURINE STRAW (A) 01/25/2017 0436   APPEARANCEUR CLEAR 01/25/2017 0436   LABSPEC 1.006 01/25/2017 0436   PHURINE 7.0 01/25/2017 0436   GLUCOSEU NEGATIVE 01/25/2017 0436   HGBUR NEGATIVE 01/25/2017 0436   BILIRUBINUR NEGATIVE 01/25/2017 0436   KETONESUR NEGATIVE 01/25/2017 0436   PROTEINUR NEGATIVE 01/25/2017 0436   UROBILINOGEN 0.2 01/29/2015 0536   NITRITE NEGATIVE 01/25/2017 0436   LEUKOCYTESUR NEGATIVE 01/25/2017 0436   Radiological Exams on Admission: CT Head Wo Contrast  Result Date: 11/29/2020 CLINICAL DATA:  Polytrauma EXAM: CT HEAD WITHOUT CONTRAST CT CERVICAL SPINE WITHOUT CONTRAST TECHNIQUE: Multidetector CT imaging of the head and cervical spine was performed following the standard protocol without intravenous contrast. Multiplanar CT image reconstructions of the cervical spine were also generated. COMPARISON:  CT head  dated January 25, 2017 FINDINGS: CT HEAD FINDINGS Brain: No evidence of acute infarction, hemorrhage, hydrocephalus, extra-axial collection or mass lesion/mass effect. Chronic white matter ischemic change. Vascular: No hyperdense vessel or unexpected calcification. Skull: Normal. Negative for fracture or focal lesion. Sinuses/Orbits: No acute finding. Other: Mild soft tissue swelling swelling overlying the right frontal bone. CT CERVICAL SPINE FINDINGS Alignment: Mild grade 1 anterolisthesis of C2 on C3 and mild retrolisthesis of C4 on C5, favored to be degenerative. Skull base and vertebrae: No acute fracture. No primary bone lesion or focal pathologic process. Soft tissues and spinal canal: No prevertebral fluid or swelling. No visible canal hematoma. Disc levels:  Multilevel moderate degenerative disc disease. Upper chest: Negative. Other: None IMPRESSION: No acute intracranial abnormality. No evidence of skull or cervical spine fracture. Electronically Signed   By: Allegra Lai M.D.   On: 11/29/2020 13:36   CT Cervical Spine Wo Contrast  Result Date: 11/29/2020 CLINICAL DATA:  Polytrauma EXAM: CT HEAD WITHOUT CONTRAST CT CERVICAL SPINE WITHOUT CONTRAST TECHNIQUE: Multidetector CT imaging of the head and cervical spine was performed following the standard protocol without intravenous contrast. Multiplanar CT image reconstructions of the cervical spine were also generated. COMPARISON:  CT head dated January 25, 2017 FINDINGS: CT HEAD FINDINGS Brain: No evidence of acute infarction, hemorrhage, hydrocephalus, extra-axial collection or mass lesion/mass effect. Chronic white matter ischemic change. Vascular: No hyperdense vessel or unexpected calcification. Skull: Normal. Negative for fracture or focal lesion. Sinuses/Orbits: No acute finding. Other: Mild soft tissue swelling swelling overlying the right frontal bone. CT CERVICAL SPINE FINDINGS Alignment: Mild grade 1 anterolisthesis of C2 on C3 and mild  retrolisthesis of C4 on C5, favored to be degenerative. Skull base and vertebrae: No acute fracture. No primary bone lesion or focal pathologic process. Soft tissues and spinal canal: No prevertebral fluid or swelling. No visible canal hematoma. Disc levels:  Multilevel moderate degenerative disc disease. Upper chest: Negative. Other: None IMPRESSION: No acute intracranial abnormality. No evidence of skull or cervical spine fracture. Electronically Signed   By: Allegra Lai M.D.   On: 11/29/2020 13:36   DG Knee Complete 4 Views Right  Result Date: 11/29/2020 CLINICAL DATA:  Fall, pain and swelling of the right knee EXAM: RIGHT KNEE - COMPLETE 4+ VIEW COMPARISON:  None. FINDINGS: Osteopenia. There is a transverse, impacted fracture of the distal right femoral metadiaphysis. There is no definite intercondylar fracture component appreciated. Severe tricompartmental arthrosis. Soft tissue edema anteriorly. IMPRESSION: 1. Transverse, impacted fracture of the distal right femoral metadiaphysis. There is no definite intercondylar fracture component appreciated, however assessment of fracture anatomy is limited by osteopenia.  2. Severe tricompartmental arthrosis. Electronically Signed   By: Lauralyn Primes M.D.   On: 11/29/2020 13:39    Assessment/Plan Principal Problem:   Femur fracture, right (HCC) Active Problems:   Hypothyroidism   Hypertension   Gait disturbance, post-stroke   Status post revision of total hip   Leukocytosis   History of stroke   Hyperlipidemia   GERD (gastroesophageal reflux disease)   Acute distal right femur fracture  - Admit to [facility] as requested by orthopedics - Dr. Aundria Rud will consult on patient at [facility].  - NPO after midnight pending orthopedics consultation.  - Bed rest for now.  - oral and IV pain management  - obtain EKG and CXR for preoperative evaluation  Fall at home - no syncope occurred, I suspect patient did trip over one of her many cats while feeding them on  porch  Hypothyroidism  - resume home levothyroxine  Essential hypertension -Resume home amlodipine 5 mg daily  Leukocytosis-reactive from acute fall and fracture -Follow daily CBC   History of stroke with chronic gait instability -Resume aspirin 81 mg daily for secondary prevention -Resume atorvastatin daily  GERD -Resume Pepcid 20 mg daily for GI protection  Hyperlipidemia  - resume home atorvastatin daily   DVT prophylaxis: Subcu heparin Code Status: Full Family Communication: Not applicable discussed plan of care with patient at bedside Disposition Plan: Anticipate SNF Consults called: Orthopedics Admission status: INP Level of care: Med-Surg Standley Dakins MD [facility] How to contact the [facility] Attending or Consulting provider 7A - 7P or covering provider during after hours 7P -7A, for this patient?  Check the care team in [facility] and look for a) attending/consulting [facility] provider listed and b) the [facility] team listed Log into [email or web address] and use [place]'s universal password to access. If you do not have the password, please contact the hospital operator. Locate the [facility] provider you are looking for under [facility] and page to a number that you can be directly reached. If you still have difficulty reaching the provider, please page the [facility] (Director on Call) for the Hospitalists listed on amion for assistance.   If 7PM-7AM, please contact night-coverage [email or web address] Password [email or web address]  11/29/2020, 2:30 PM

## 2020-11-30 ENCOUNTER — Encounter (HOSPITAL_COMMUNITY)
Admission: EM | Disposition: A | Discharge status: Rehabilitation Facility | Payer: Self-pay | Source: Home / Self Care | Attending: Family Medicine

## 2020-11-30 ENCOUNTER — Encounter (HOSPITAL_COMMUNITY): Payer: Self-pay | Admitting: Family Medicine

## 2020-11-30 ENCOUNTER — Inpatient Hospital Stay (HOSPITAL_COMMUNITY): Payer: Medicare Other | Admitting: Anesthesiology

## 2020-11-30 ENCOUNTER — Inpatient Hospital Stay (HOSPITAL_COMMUNITY): Payer: Medicare Other

## 2020-11-30 HISTORY — PX: ORIF FEMUR FRACTURE: SHX2119

## 2020-11-30 LAB — BASIC METABOLIC PANEL
Anion gap: 11 (ref 5–15)
BUN: 23 mg/dL (ref 8–23)
CO2: 21 mmol/L — ABNORMAL LOW (ref 22–32)
Calcium: 8.9 mg/dL (ref 8.9–10.3)
Chloride: 108 mmol/L (ref 98–111)
Creatinine, Ser: 0.85 mg/dL (ref 0.44–1.00)
GFR, Estimated: >60 mL/min (ref >60)
Glucose, Bld: 129 mg/dL — ABNORMAL HIGH (ref 70–99)
Potassium: 4.5 mmol/L (ref 3.5–5.1)
Sodium: 140 mmol/L (ref 135–145)

## 2020-11-30 LAB — CBC
HCT: 33.1 % — ABNORMAL LOW (ref 36.0–46.0)
Hemoglobin: 11.1 g/dL — ABNORMAL LOW (ref 12.0–15.0)
MCH: 31.9 pg (ref 26.0–34.0)
MCHC: 33.5 g/dL (ref 30.0–36.0)
MCV: 95.1 fL (ref 80.0–100.0)
Platelets: 258 10*3/uL (ref 150–400)
RBC: 3.48 MIL/uL — ABNORMAL LOW (ref 3.87–5.11)
RDW: 13.3 % (ref 11.5–15.5)
WBC: 10.5 10*3/uL (ref 4.0–10.5)
nRBC: 0 % (ref 0.0–0.2)

## 2020-11-30 LAB — SURGICAL PCR SCREEN
MRSA, PCR: NEGATIVE
Staphylococcus aureus: NEGATIVE

## 2020-11-30 LAB — TYPE AND SCREEN
ABO/RH(D): O NEG
Antibody Screen: NEGATIVE

## 2020-11-30 LAB — SARS CORONAVIRUS 2 (TAT 6-24 HRS): SARS Coronavirus 2: NEGATIVE

## 2020-11-30 SURGERY — OPEN REDUCTION INTERNAL FIXATION (ORIF) DISTAL FEMUR FRACTURE
Anesthesia: Spinal | Laterality: Right

## 2020-11-30 MED ORDER — LACTATED RINGERS IV SOLN: INTRAVENOUS | Status: DC

## 2020-11-30 MED ORDER — ONDANSETRON HCL 4 MG/2ML IJ SOLN: 4.0000 mg | Freq: Four times a day (QID) | INTRAMUSCULAR | Status: DC | PRN

## 2020-11-30 MED ORDER — FENTANYL CITRATE (PF) 100 MCG/2ML IJ SOLN
25.0000 ug | INTRAMUSCULAR | Status: DC | PRN
  Administered 2020-11-30: 25 ug via INTRAVENOUS

## 2020-11-30 MED ORDER — PROPOFOL 500 MG/50ML IV EMUL
INTRAVENOUS | Status: DC | PRN
  Administered 2020-11-30: 25 ug/kg/min via INTRAVENOUS

## 2020-11-30 MED ORDER — METOCLOPRAMIDE HCL 5 MG/ML IJ SOLN: 5.0000 mg | Freq: Three times a day (TID) | INTRAMUSCULAR | Status: DC | PRN

## 2020-11-30 MED ORDER — METOCLOPRAMIDE HCL 5 MG PO TABS
5.0000 mg | ORAL_TABLET | Freq: Three times a day (TID) | ORAL | Status: DC | PRN
  Filled 2020-11-30: qty 1

## 2020-11-30 MED ORDER — PROPOFOL 10 MG/ML IV BOLUS
INTRAVENOUS | Status: DC | PRN
  Administered 2020-11-30 (×2): 20 mg via INTRAVENOUS
  Administered 2020-11-30: 10 mg via INTRAVENOUS

## 2020-11-30 MED ORDER — PHENOL 1.4 % MT LIQD
1.0000 | OROMUCOSAL | Status: DC | PRN
Start: 2020-11-30 — End: 2020-12-05

## 2020-11-30 MED ORDER — FENTANYL CITRATE (PF) 100 MCG/2ML IJ SOLN
INTRAMUSCULAR | Status: AC
  Filled 2020-11-30: qty 2

## 2020-11-30 MED ORDER — BUPIVACAINE IN DEXTROSE 0.75-8.25 % IT SOLN
INTRATHECAL | Status: DC | PRN
  Administered 2020-11-30: 1.6 mL via INTRATHECAL

## 2020-11-30 MED ORDER — ONDANSETRON HCL 4 MG PO TABS
4.0000 mg | ORAL_TABLET | Freq: Four times a day (QID) | ORAL | Status: DC | PRN
  Administered 2020-11-30: 4 mg via ORAL
  Filled 2020-11-30: qty 1

## 2020-11-30 MED ORDER — CHLORHEXIDINE GLUCONATE 4 % EX LIQD
60.0000 mL | Freq: Once | CUTANEOUS | Status: AC
  Administered 2020-11-30: 4 via TOPICAL

## 2020-11-30 MED ORDER — CHLORHEXIDINE GLUCONATE 0.12 % MT SOLN
OROMUCOSAL | Status: AC
  Filled 2020-11-30: qty 15

## 2020-11-30 MED ORDER — ENOXAPARIN SODIUM 40 MG/0.4ML IJ SOSY
40.0000 mg | PREFILLED_SYRINGE | INTRAMUSCULAR | Status: DC
  Administered 2020-12-01 – 2020-12-05 (×5): 40 mg via SUBCUTANEOUS
  Filled 2020-11-30 (×5): qty 0.4

## 2020-11-30 MED ORDER — ACETAMINOPHEN 325 MG PO TABS: 325.0000 mg | ORAL_TABLET | Freq: Four times a day (QID) | ORAL | Status: DC | PRN

## 2020-11-30 MED ORDER — PHENYLEPHRINE HCL-NACL 20-0.9 MG/250ML-% IV SOLN
INTRAVENOUS | Status: DC | PRN
  Administered 2020-11-30: 40 ug/min via INTRAVENOUS

## 2020-11-30 MED ORDER — ACETAMINOPHEN 325 MG PO TABS
650.0000 mg | ORAL_TABLET | Freq: Two times a day (BID) | ORAL | Status: AC
  Administered 2020-11-30 – 2020-12-02 (×4): 650 mg via ORAL
  Filled 2020-11-30 (×4): qty 2

## 2020-11-30 MED ORDER — CEFAZOLIN SODIUM-DEXTROSE 2-4 GM/100ML-% IV SOLN
2.0000 g | INTRAVENOUS | Status: AC
  Administered 2020-11-30: 2 g via INTRAVENOUS
  Filled 2020-11-30 (×3): qty 100

## 2020-11-30 MED ORDER — ORAL CARE MOUTH RINSE: 15.0000 mL | Freq: Once | OROMUCOSAL | Status: AC

## 2020-11-30 MED ORDER — CEFAZOLIN SODIUM-DEXTROSE 2-4 GM/100ML-% IV SOLN
2.0000 g | Freq: Four times a day (QID) | INTRAVENOUS | Status: AC
  Administered 2020-11-30 – 2020-12-01 (×2): 2 g via INTRAVENOUS
  Filled 2020-11-30 (×2): qty 100

## 2020-11-30 MED ORDER — MENTHOL 3 MG MT LOZG: 1.0000 | LOZENGE | OROMUCOSAL | Status: DC | PRN

## 2020-11-30 MED ORDER — DOCUSATE SODIUM 100 MG PO CAPS
100.0000 mg | ORAL_CAPSULE | Freq: Two times a day (BID) | ORAL | Status: DC
Start: 2020-11-30 — End: 2020-12-05
  Administered 2020-12-01 – 2020-12-05 (×8): 100 mg via ORAL
  Filled 2020-11-30 (×9): qty 1

## 2020-11-30 MED ORDER — ONDANSETRON HCL 4 MG/2ML IJ SOLN: 4.0000 mg | Freq: Once | INTRAMUSCULAR | Status: DC | PRN

## 2020-11-30 MED ORDER — POVIDONE-IODINE 10 % EX SWAB
2.0000 "application " | Freq: Once | CUTANEOUS | Status: AC
  Administered 2020-11-30: 2 via TOPICAL

## 2020-11-30 MED ORDER — CHLORHEXIDINE GLUCONATE 0.12 % MT SOLN: 15.0000 mL | Freq: Once | OROMUCOSAL | Status: AC

## 2020-11-30 MED ORDER — FENTANYL CITRATE (PF) 250 MCG/5ML IJ SOLN
INTRAMUSCULAR | Status: AC
  Filled 2020-11-30: qty 5

## 2020-11-30 MED ORDER — ADULT MULTIVITAMIN W/MINERALS CH
1.0000 | ORAL_TABLET | Freq: Every day | ORAL | Status: DC
  Administered 2020-12-01 – 2020-12-05 (×5): 1 via ORAL
  Filled 2020-11-30 (×5): qty 1

## 2020-11-30 MED ORDER — 0.9 % SODIUM CHLORIDE (POUR BTL) OPTIME
TOPICAL | Status: DC | PRN
  Administered 2020-11-30: 1000 mL

## 2020-11-30 MED ORDER — ENSURE ENLIVE PO LIQD
237.0000 mL | Freq: Two times a day (BID) | ORAL | Status: DC
  Administered 2020-12-01 – 2020-12-04 (×5): 237 mL via ORAL

## 2020-11-30 MED ORDER — CHLORHEXIDINE GLUCONATE 0.12 % MT SOLN
OROMUCOSAL | Status: AC
  Administered 2020-11-30: 15 mL via OROMUCOSAL
  Filled 2020-11-30: qty 15

## 2020-11-30 SURGICAL SUPPLY — 76 items
BAG COUNTER SPONGE SURGICOUNT (BAG) ×2 IMPLANT
BAG SPNG CNTER NS LX DISP (BAG) ×1
BIT DRILL 3.3MM (BIT) ×1 IMPLANT
BIT DRILL NCB FEM QC 4.3X245 (BIT) ×1 IMPLANT
BIT DRILL QC 3.3X195 (BIT) ×2 IMPLANT
BLADE CLIPPER SURG (BLADE) IMPLANT
BNDG ELASTIC 4X5.8 VLCR STR LF (GAUZE/BANDAGES/DRESSINGS) ×2 IMPLANT
BNDG ELASTIC 6X5.8 VLCR STR LF (GAUZE/BANDAGES/DRESSINGS) ×2 IMPLANT
BNDG GAUZE ELAST 4 BULKY (GAUZE/BANDAGES/DRESSINGS) ×2 IMPLANT
BRUSH SCRUB EZ PLAIN DRY (MISCELLANEOUS) ×4 IMPLANT
CANISTER SUCT 3000ML PPV (MISCELLANEOUS) ×2 IMPLANT
CAP LOCK NCB (Cap) ×12 IMPLANT
COVER SURGICAL LIGHT HANDLE (MISCELLANEOUS) ×2 IMPLANT
DRAPE C-ARM 42X72 X-RAY (DRAPES) ×2 IMPLANT
DRAPE C-ARMOR (DRAPES) ×2 IMPLANT
DRAPE IMP U-DRAPE 54X76 (DRAPES) ×2 IMPLANT
DRAPE ORTHO SPLIT 77X108 STRL (DRAPES) ×6
DRAPE SURG ORHT 6 SPLT 77X108 (DRAPES) ×3 IMPLANT
DRAPE U-SHAPE 47X51 STRL (DRAPES) ×2 IMPLANT
DRILL 3.3MM (BIT) ×2
DRILL 4.3MM (BIT) ×2
DRSG ADAPTIC 3X8 NADH LF (GAUZE/BANDAGES/DRESSINGS) ×2 IMPLANT
DRSG MEPILEX BORDER 4X12 (GAUZE/BANDAGES/DRESSINGS) ×2 IMPLANT
DRSG PAD ABDOMINAL 8X10 ST (GAUZE/BANDAGES/DRESSINGS) ×8 IMPLANT
ELECT REM PT RETURN 9FT ADLT (ELECTROSURGICAL) ×2
ELECTRODE REM PT RTRN 9FT ADLT (ELECTROSURGICAL) ×1 IMPLANT
EVACUATOR 1/8 PVC DRAIN (DRAIN) IMPLANT
EVACUATOR 3/16  PVC DRAIN (DRAIN)
EVACUATOR 3/16 PVC DRAIN (DRAIN) IMPLANT
GAUZE SPONGE 4X4 12PLY STRL (GAUZE/BANDAGES/DRESSINGS) ×2 IMPLANT
GLOVE SRG 8 PF TXTR STRL LF DI (GLOVE) ×1 IMPLANT
GLOVE SURG ENC MOIS LTX SZ7.5 (GLOVE) ×2 IMPLANT
GLOVE SURG ENC MOIS LTX SZ8 (GLOVE) ×2 IMPLANT
GLOVE SURG UNDER POLY LF SZ7.5 (GLOVE) ×2 IMPLANT
GLOVE SURG UNDER POLY LF SZ8 (GLOVE) ×2
GOWN STRL REUS W/ TWL LRG LVL3 (GOWN DISPOSABLE) ×2 IMPLANT
GOWN STRL REUS W/ TWL XL LVL3 (GOWN DISPOSABLE) ×1 IMPLANT
GOWN STRL REUS W/TWL LRG LVL3 (GOWN DISPOSABLE) ×4
GOWN STRL REUS W/TWL XL LVL3 (GOWN DISPOSABLE) ×2
K-WIRE 2.0 (WIRE) ×6
K-WIRE FXSTD 280X2XNS SS (WIRE) ×3
KIT BASIN OR (CUSTOM PROCEDURE TRAY) ×2 IMPLANT
KIT TURNOVER KIT B (KITS) ×2 IMPLANT
KWIRE FXSTD 280X2XNS SS (WIRE) ×3 IMPLANT
NEEDLE 22X1 1/2 (OR ONLY) (NEEDLE) IMPLANT
NS IRRIG 1000ML POUR BTL (IV SOLUTION) ×2 IMPLANT
PACK TOTAL JOINT (CUSTOM PROCEDURE TRAY) ×2 IMPLANT
PACK UNIVERSAL I (CUSTOM PROCEDURE TRAY) ×2 IMPLANT
PAD ARMBOARD 7.5X6 YLW CONV (MISCELLANEOUS) ×4 IMPLANT
PAD CAST 4YDX4 CTTN HI CHSV (CAST SUPPLIES) ×1 IMPLANT
PADDING CAST COTTON 4X4 STRL (CAST SUPPLIES) ×2
PADDING CAST COTTON 6X4 STRL (CAST SUPPLIES) ×2 IMPLANT
PLATE FEMUR PROX NCB 9HOLE PP (Plate) ×2 IMPLANT
SCREW 5.0 32MM (Screw) ×2 IMPLANT
SCREW 5.0 80MM (Screw) ×6 IMPLANT
SCREW CORT NCB SELFTAP 5.0X42 (Screw) ×2 IMPLANT
SCREW CORTICAL NCB 5.0X90MM (Screw) ×2 IMPLANT
SCREW NCB 4.0 32MM (Screw) ×2 IMPLANT
SCREW NCB 5.0X36MM (Screw) ×2 IMPLANT
SCREW NCB 5.0X85MM (Screw) ×4 IMPLANT
SPONGE T-LAP 18X18 ~~LOC~~+RFID (SPONGE) ×2 IMPLANT
STAPLER VISISTAT 35W (STAPLE) ×2 IMPLANT
SUCTION FRAZIER HANDLE 10FR (MISCELLANEOUS) ×1
SUCTION TUBE FRAZIER 10FR DISP (MISCELLANEOUS) ×1 IMPLANT
SUT PROLENE 0 CT 2 (SUTURE) IMPLANT
SUT VIC AB 0 CT1 27 (SUTURE) ×4
SUT VIC AB 0 CT1 27XBRD ANBCTR (SUTURE) ×2 IMPLANT
SUT VIC AB 1 CT1 27 (SUTURE) ×4
SUT VIC AB 1 CT1 27XBRD ANBCTR (SUTURE) ×2 IMPLANT
SUT VIC AB 2-0 CT1 27 (SUTURE) ×4
SUT VIC AB 2-0 CT1 TAPERPNT 27 (SUTURE) ×2 IMPLANT
SYR 20ML ECCENTRIC (SYRINGE) IMPLANT
TOWEL GREEN STERILE (TOWEL DISPOSABLE) ×4 IMPLANT
TOWEL GREEN STERILE FF (TOWEL DISPOSABLE) ×2 IMPLANT
TRAY FOLEY MTR SLVR 16FR STAT (SET/KITS/TRAYS/PACK) IMPLANT
WATER STERILE IRR 1000ML POUR (IV SOLUTION) ×4 IMPLANT

## 2020-11-30 NOTE — Progress Notes (Signed)
Initial Nutrition Assessment  DOCUMENTATION CODES:   Not applicable  INTERVENTION:   -Once diet is advanced, add:  -Ensure Enlive po BID, each supplement provides 350 kcal and 20 grams of protein  -MVI with minerals daily  NUTRITION DIAGNOSIS:   Increased nutrient needs related to post-op healing as evidenced by estimated needs.  GOAL:   Patient will meet greater than or equal to 90% of their needs  MONITOR:   PO intake, Supplement acceptance, Diet advancement, Labs, Weight trends, Skin, I & O's  REASON FOR ASSESSMENT:   Consult Assessment of nutrition requirement/status, Hip fracture protocol  ASSESSMENT:   Andrea [name] is a 87 y.o. 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  She reports that she was feeding the cats this morning on her front porch when she developed a fall.  She reports that she lost balance and footing on the right leg.  She is adamant that she did not trip for over any of the cats but she does not seem to be 100% sure.  She has been having severe pain involving the right distal femur and inability to ambulate since falling.  She denies having chest pain or shortness of breath.  She denies passing out.  She denies having vision changes.  She reports that she does have friends and acquaintances that live nearby that are able to check on her from time to time and assist her when needed.  Pt admitted with acute distal rt femur fracture.   Case discussed with RN, who reports pt is being transported to surgery at time of visit. RD unable to speak with pt or complete nutrition-focused physical exam at this time.    Reviewed wt hx; noted mild wt loss over the past few years. No recent wt hx to assess for changes at this time.    Pt with increased nutritional needs for post-operative healing and would greatly benefit from addition of oral nutrition supplements.   Medications reviewed and include lactated ringers infusion @ 10 ml/hr.   Labs reviewed.   Diet Order:   Diet Order             Diet NPO time specified Except for: Sips with Meds  Diet effective midnight                   EDUCATION NEEDS:   No education needs have been identified at this time  Skin:  Skin Assessment: Reviewed RN Assessment  Last BM:  Unknown  Height:   Ht Readings from Last 1 Encounters:  11/30/20 5\' 4"  (1.626 m)    Weight:   Wt Readings from Last 1 Encounters:  11/30/20 72.6 kg    Ideal Body Weight:  54.5 kg  BMI:  Body mass index is 27.46 kg/m.  Estimated Nutritional Needs:   Kcal:  1600-1800  Protein:  85-100 grams  Fluid:  > 1.6 L    01/30/21, RD, LDN, CDCES Registered Dietitian II Certified Diabetes Care and Education Specialist Please refer to [facility] for RD and/or RD on-call/weekend/after hours pager

## 2020-11-30 NOTE — Anesthesia Preprocedure Evaluation (Signed)
Anesthesia Evaluation  Patient identified by MRN, date of birth, ID band Patient awake    Reviewed: Allergy & Precautions, NPO status , Patient's Chart, lab work & pertinent test results, reviewed documented beta blocker date and time   Airway Mallampati: I  TM Distance: >3 FB Neck ROM: Full    Dental  (+) Edentulous Upper, Edentulous Lower   Pulmonary neg pulmonary ROS,    Pulmonary exam normal breath sounds clear to auscultation       Cardiovascular hypertension, Pt. on medications Normal cardiovascular exam Rhythm:Regular Rate:Normal     Neuro/Psych Gait disturbance post CVA TIACVA, Residual Symptoms negative psych ROS   GI/Hepatic Neg liver ROS, GERD  Medicated,  Endo/Other  Hypothyroidism Hyperlipidemia   Renal/GU negative Renal ROS  negative genitourinary   Musculoskeletal Right distal femur Fx   Abdominal   Peds  Hematology  (+) anemia ,   Anesthesia Other Findings   Reproductive/Obstetrics                             Anesthesia Physical Anesthesia Plan  ASA: 3  Anesthesia Plan: Spinal   Post-op Pain Management:    Induction: Intravenous  PONV Risk Score and Plan: 3 and Treatment may vary due to age or medical condition, Propofol infusion and Ondansetron  Airway Management Planned: Natural Airway and Simple Face Mask  Additional Equipment:   Intra-op Plan:   Post-operative Plan:   Informed Consent: I have reviewed the patients History and Physical, chart, labs and discussed the procedure including the risks, benefits and alternatives for the proposed anesthesia with the patient or authorized representative who has indicated his/her understanding and acceptance.       Plan Discussed with: CRNA and Anesthesiologist  Anesthesia Plan Comments:         Anesthesia Quick Evaluation

## 2020-11-30 NOTE — Progress Notes (Signed)
PROGRESS NOTE    Andrea Hatchetancy Plato  UEA:[id number] DOB: 04/28/1925 DOA: 11/29/2020 PCP: [facility]   Brief Narrative:  HPI: Andrea [name] is a 87 y.o. 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  She reports that she was feeding the cats this morning on her front porch when she developed a fall.  She reports that she lost balance and footing on the right leg.  She is adamant that she did not trip for over any of the cats but she does not seem to be 100% sure.  She has been having severe pain involving the right distal femur and inability to ambulate since falling.  She denies having chest pain or shortness of breath.  She denies passing out.  She denies having vision changes.  She reports that she does have friends and acquaintances that live nearby that are able to check on her from time to time and assist her when needed. ED Course: WBC was elevated at 18.3, hemoglobin 12.8, hematocrit 39.1, platelet count 320, sodium 141, potassium 4.0, glucose 145, calcium 9.3.  CT head and cervical spine did not show any acute findings.  X-ray of the right knee confirms a distal right femur fracture.  [facility] and reported that they could not treat her at this facility.  [facility] and agreed to care for patient and [facility].  Assessment & Plan:   Principal Problem:   Femur fracture, right [facility](HCC) Active Problems:   Hypothyroidism   Hypertension   Gait disturbance, post-stroke   Status post revision of total hip   Leukocytosis   History of stroke   Hyperlipidemia   GERD (gastroesophageal reflux disease)   Acute right distal femur fracture secondary to fall:  No history of syncope.  Orthopedics on board.  Management per them.  She is stable and pain-free at this point in time.   Hypothyroidism: Continue Synthroid.   Essential hypertension: Controlled.  Continue amlodipine.  History of stroke with chronic gait instability/hyperlipidemia:   GERD: Continue Pepcid.  DVT prophylaxis: heparin injection 5,000 Units Start: 11/30/20 2200 Foot Pump / plexipulse Start: 11/29/20 1526   Code Status: Full Code  Family Communication: None present at bedside.  Plan of care discussed with patient in length and he verbalized understanding and agreed with it.  Status is: Inpatient  Remains inpatient appropriate because:Inpatient level of care appropriate due to severity of illness  Dispo: The patient is from: Home              Anticipated d/c is to: SNF              Patient currently is not medically stable to d/c.   Difficult to place patient No        Estimated body mass index is 27.46 kg/m as calculated from the following:   Height as of this encounter: 5\' 4"  (1.626 m).   Weight as of this encounter: 72.6 kg.    Nutritional Assessment: Body mass index is 27.46 kg/m.Marland Kitchen. Seen by dietician.  I agree with the assessment and plan as outlined below: Nutrition Status:        .  Skin Assessment: I have examined the patient's skin and I agree with the wound  assessment as performed by the wound care RN as outlined below:    Consultants:  Orthopedics  Procedures:  None  Antimicrobials:  Anti-infectives (From admission, onward)    Start     Dose/Rate Route Frequency Ordered Stop   11/30/20 1145  ceFAZolin (ANCEF) IVPB 2g/100 mL premix        2 g 200 mL/hr over 30 Minutes Intravenous On call to O.R. 11/30/20 1059 12/01/20 0559          Subjective: Seen and examined.  She is very pleasant lady.  She has no complaints.  No pain.  She takes care of 30 cats by herself.  Objective: Vitals:   11/29/20 1220 11/29/20 2102 11/29/20  2232 11/30/20 0739  BP: (!) 110/52 115/82 (!) 112/51 (!) 128/55  Pulse: 67 79 82 77  Resp:  17 18 16   Temp: 98.3 F (36.8 C) 98.1 F (36.7 C) 98.2 F (36.8 C) 98.2 F (36.8 C)  TempSrc: Oral  Oral Oral  SpO2: 95% 98% 98% 93%  Weight:      Height:       No intake or output data in the 24 hours ending 11/30/20 1105 Filed Weights   11/29/20 1156  Weight: 72.6 kg    Examination:  General exam: Appears calm and comfortable  Respiratory system: Clear to auscultation. Respiratory effort normal. Cardiovascular system: S1 & S2 heard, RRR. No JVD, murmurs, rubs, gallops or clicks. No pedal edema. Gastrointestinal system: Abdomen is nondistended, soft and nontender. No organomegaly or masses felt. Normal bowel sounds heard. Central nervous system: Alert and oriented. No focal neurological deficits. Extremities: Right lower extremity shortened and externally rotated Skin: No rashes, lesions or ulcers Psychiatry: Judgement and insight appear normal. Mood & affect appropriate.    Data Reviewed: I have personally reviewed following labs and imaging studies  CBC: Recent Labs  Lab 11/29/20 1313 11/30/20 0052  WBC 18.3* 10.5  NEUTROABS 15.4*  --   HGB 12.8 11.1*  HCT 39.1 33.1*  MCV 98.0 95.1  PLT 320 258   Basic Metabolic Panel: Recent Labs  Lab 11/29/20 1313 11/30/20 0052  NA 141 140  K 4.0 4.5  CL 106 108  CO2 28 21*  GLUCOSE 145* 129*  BUN 23 23  CREATININE 0.92 0.85  CALCIUM 9.3 8.9   GFR: Estimated Creatinine Clearance: 40.4 mL/min (by C-G formula based on SCr of 0.85 mg/dL). Liver Function Tests: No results for input(s): AST, ALT, ALKPHOS, BILITOT, PROT, ALBUMIN in the last 168 hours. No results for input(s): LIPASE, AMYLASE in the last 168 hours. No results for input(s): AMMONIA in the last 168 hours. Coagulation Profile: No results for input(s): INR, PROTIME in the last 168 hours. Cardiac Enzymes: No results for input(s): CKTOTAL, CKMB, CKMBINDEX, TROPONINI  in the last 168 hours. BNP (last 3 results) No results for input(s): PROBNP in the last 8760 hours. HbA1C: No results for input(s): HGBA1C in the last 72 hours. CBG: No results for input(s): GLUCAP in the last 168 hours. Lipid Profile: No results for input(s): CHOL, HDL, LDLCALC, TRIG, CHOLHDL, LDLDIRECT in the last 72 hours. Thyroid Function Tests: No results for input(s): TSH, T4TOTAL, FREET4, T3FREE, THYROIDAB in the last 72 hours. Anemia Panel: No results for input(s): VITAMINB12, FOLATE, FERRITIN, TIBC, IRON, RETICCTPCT in the last 72 hours. Sepsis Labs: No results for input(s): PROCALCITON, LATICACIDVEN in the last 168 hours.  No results found for this or any previous visit (from the past 240 hour(s)).    Radiology Studies:  CT Head Wo Contrast  Result Date: 11/29/2020 CLINICAL DATA:  Polytrauma EXAM: CT HEAD WITHOUT CONTRAST CT CERVICAL SPINE WITHOUT CONTRAST TECHNIQUE: Multidetector CT imaging of the head and cervical spine was performed following the standard protocol without intravenous contrast. Multiplanar CT image reconstructions of the cervical spine were also generated. COMPARISON:  CT head dated January 25, 2017 FINDINGS: CT HEAD FINDINGS Brain: No evidence of acute infarction, hemorrhage, hydrocephalus, extra-axial collection or mass lesion/mass effect. Chronic white matter ischemic change. Vascular: No hyperdense vessel or unexpected calcification. Skull: Normal. Negative for fracture or focal lesion. Sinuses/Orbits: No acute finding. Other: Mild soft tissue swelling swelling overlying the right frontal bone. CT CERVICAL SPINE FINDINGS Alignment: Mild grade 1 anterolisthesis of C2 on C3 and mild retrolisthesis of C4 on C5, favored to be degenerative. Skull base and vertebrae: No acute fracture. No primary bone lesion or focal pathologic process. Soft tissues and spinal canal: No prevertebral fluid or swelling. No visible canal hematoma. Disc levels:  Multilevel moderate  degenerative disc disease. Upper chest: Negative. Other: None IMPRESSION: No acute intracranial abnormality. No evidence of skull or cervical spine fracture. Electronically Signed   By: Allegra Lai M.D.   On: 11/29/2020 13:36   CT Cervical Spine Wo Contrast  Result Date: 11/29/2020 CLINICAL DATA:  Polytrauma EXAM: CT HEAD WITHOUT CONTRAST CT CERVICAL SPINE WITHOUT CONTRAST TECHNIQUE: Multidetector CT imaging of the head and cervical spine was performed following the standard protocol without intravenous contrast. Multiplanar CT image reconstructions of the cervical spine were also generated. COMPARISON:  CT head dated January 25, 2017 FINDINGS: CT HEAD FINDINGS Brain: No evidence of acute infarction, hemorrhage, hydrocephalus, extra-axial collection or mass lesion/mass effect. Chronic white matter ischemic change. Vascular: No hyperdense vessel or unexpected calcification. Skull: Normal. Negative for fracture or focal lesion. Sinuses/Orbits: No acute finding. Other: Mild soft tissue swelling swelling overlying the right frontal bone. CT CERVICAL SPINE FINDINGS Alignment: Mild grade 1 anterolisthesis of C2 on C3 and mild retrolisthesis of C4 on C5, favored to be degenerative. Skull base and vertebrae: No acute fracture. No primary bone lesion or focal pathologic process. Soft tissues and spinal canal: No prevertebral fluid or swelling. No visible canal hematoma. Disc levels:  Multilevel moderate degenerative disc disease. Upper chest: Negative. Other: None IMPRESSION: No acute intracranial abnormality. No evidence of skull or cervical spine fracture. Electronically Signed   By: Allegra Lai M.D.   On: 11/29/2020 13:36   CT KNEE RIGHT WO CONTRAST  Result Date: 11/29/2020 CLINICAL DATA:  Fracture, knee EXAM: CT OF THE right KNEE WITHOUT CONTRAST TECHNIQUE: Multidetector CT imaging of the right knee was performed according to the standard protocol. Multiplanar CT image reconstructions were also  generated. COMPARISON:  Radiograph 11/30/2018 FINDINGS: Bones/Joint/Cartilage Diffuse osteopenia. There is an impacted fracture through the distal femoral metaphysis, which extends into the knee joint through the intercondylar notch anteriorly. There is half shaft posterior displacement, approximately 1.5 cm, and 1.7 cm lateral displacement. There is lateral angulation. There is a small linear ossific fragment at the expected location of the MCL attachment on the medial femoral condyle (coronal series 5, image 83). There is severe tricompartment osteoarthritis with articular surface irregularity and moderate-severe joint space narrowing. Chondrocalcinosis. Small joint effusion. Patella baja. Ligaments Suboptimally assessed by CT. Muscles and Tendons No intramuscular fluid collection.  Extensive edema. Soft tissues Superficial soft tissue swelling.  No focal fluid collection. IMPRESSION: Displaced, angulated, and impacted distal femoral metaphyseal fracture, with intra-articular extension into the knee joint through the intercondylar  notch. Small joint effusion. Diffuse osteopenia. Possible small proximal MCL avulsion fracture. Severe tricompartment osteoarthritis. Electronically Signed   By: Caprice Renshaw M.D.   On: 11/29/2020 16:22   Chest Portable 1 View  Result Date: 11/29/2020 CLINICAL DATA:  Preoperative. Right knee fracture. History of hypertension EXAM: PORTABLE CHEST 1 VIEW COMPARISON:  Chest x-ray 01/25/17 FINDINGS: The heart and mediastinal contours unchanged.  Aortic calcification. No focal consolidation. No pulmonary edema. No pleural effusion. No pneumothorax. No acute osseous abnormality. Bilateral shoulder degenerative changes. IMPRESSION: No active disease. Electronically Signed   By: Tish Frederickson M.D.   On: 11/29/2020 16:31   DG Knee Complete 4 Views Right  Result Date: 11/29/2020 CLINICAL DATA:  Fall, pain and swelling of the right knee EXAM: RIGHT KNEE - COMPLETE 4+ VIEW COMPARISON:   None. FINDINGS: Osteopenia. There is a transverse, impacted fracture of the distal right femoral metadiaphysis. There is no definite intercondylar fracture component appreciated. Severe tricompartmental arthrosis. Soft tissue edema anteriorly. IMPRESSION: 1. Transverse, impacted fracture of the distal right femoral metadiaphysis. There is no definite intercondylar fracture component appreciated, however assessment of fracture anatomy is limited by osteopenia. 2. Severe tricompartmental arthrosis. Electronically Signed   By: Lauralyn Primes M.D.   On: 11/29/2020 13:39    Scheduled Meds:  amLODipine  5 mg Oral Daily   aspirin EC  81 mg Oral Daily   atorvastatin  20 mg Oral QPM   chlorhexidine  60 mL Topical Once   famotidine  20 mg Oral QHS   heparin  5,000 Units Subcutaneous Q8H   levothyroxine  88 mcg Oral Daily   povidone-iodine  2 application Topical Once   Continuous Infusions:  sodium chloride 35 mL/hr at 11/29/20 1722    ceFAZolin (ANCEF) IV       LOS: 1 day   Time spent: 32 minutes   Hughie Closs, MD [facility]  11/30/2020, 11:05 AM   How to contact the [facility] Attending or Consulting provider 7A - 7P or covering provider during after hours 7P -7A, for this patient?  Check the care team in [facility] and look for a) attending/consulting [facility] provider listed and b) the [facility] team listed. Page or secure chat 7A-7P. Log into [email or web address] and use [place]'s universal password to access. If you do not have the password, please contact the hospital operator. Locate the [facility] provider you are looking for under [facility] and page to a number that you can be directly reached. If you still have difficulty reaching the provider, please page the [facility] (Director on Call) for the Hospitalists listed on amion for assistance.

## 2020-11-30 NOTE — Anesthesia Procedure Notes (Signed)
Spinal  Patient location during procedure: OR Start time: 11/30/2020 1:05 PM End time: 11/30/2020 1:09 PM Reason for block: surgical anesthesia Staffing Performed: anesthesiologist  Anesthesiologist: Mal Amabile, MD Preanesthetic Checklist Completed: patient identified, IV checked, site marked, risks and benefits discussed, surgical consent, monitors and equipment checked, pre-op evaluation and timeout performed Spinal Block Patient position: right lateral decubitus Prep: DuraPrep and site prepped and draped Patient monitoring: heart rate, cardiac monitor, continuous pulse ox and blood pressure Approach: midline Location: L3-4 Injection technique: single-shot Needle Needle type: Pencan  Needle gauge: 24 G Needle length: 9 cm Needle insertion depth: 6 cm Assessment Sensory level: T6 Events: CSF return Additional Notes Patient tolerated procedure well. Adequate sensory level.

## 2020-11-30 NOTE — Consult Note (Signed)
Reason for Consult:Right distal femur fx Referring Physician: Hughie Closs Time called: 1031 Time at bedside: 1051   Andrea [name] is an 87 y.o. female.  HPI: Andrea [name] was out feeding her cats when she lost her balance and fell. She had immediate right knee pain and could not get up. She was taken to [facility] where x-rays showed a distal femur fx. Orthopedic surgery was consulted and requested orthopedic trauma evaluation due to the complexity of the fracture. She was transferred to [facility] for definitive treatment. She lives at home alone and usually ambulates with a RW.  Past Medical History:  Diagnosis Date   Hypertension    Thyroid disease     Past Surgical History:  Procedure Laterality Date   ABDOMINAL HYSTERECTOMY     ORIF HIP FRACTURE Left 09/02/2014   Procedure: OPEN REDUCTION INTERNAL FIXATION LEFT HIP FRACTURE;  Surgeon: Darreld Mclean, MD;  Location: [facility];  Service: Orthopedics;  Laterality: Left;    Family History  Problem Relation Age of Onset   CAD Mother 22       Died of MI   Heart failure Father    CAD Father     Social History:  reports that she has never smoked. She has never used smokeless tobacco. She reports that she does not drink alcohol and does not use drugs.  Allergies: Not on File  Medications: I have reviewed the patient's current medications.  Results for orders placed or performed during the hospital encounter of 11/29/20 (from the past 48 hour(s))  Basic metabolic panel     Status: Abnormal   Collection Time: 11/29/20  1:13 PM  Result Value Ref Range   Sodium 141 135 - 145 mmol/L   Potassium 4.0 3.5 - 5.1 mmol/L   Chloride 106 98 - 111 mmol/L   CO2 28 22 - 32 mmol/L   Glucose, Bld 145 (H) 70 - 99 mg/dL    Comment: Glucose reference range applies only to samples taken after fasting for at least 8 hours.   BUN 23 8 - 23 mg/dL   Creatinine, Ser 2.44 0.44 - 1.00 mg/dL   Calcium 9.3 8.9 - 01.0 mg/dL   GFR, Estimated 58 (L) >60 mL/min    Comment:  (NOTE) Calculated using the CKD-EPI Creatinine Equation (2021)    Anion gap 7 5 - 15    Comment: Performed at [facility], [address] [place], [place] [address]  CBC with Differential     Status: Abnormal   Collection Time: 11/29/20  1:13 PM  Result Value Ref Range   WBC 18.3 (H) 4.0 - 10.5 K/uL   RBC 3.99 3.87 - 5.11 MIL/uL   Hemoglobin 12.8 12.0 - 15.0 g/dL   HCT 66.4 40.3 - 47.4 %   MCV 98.0 80.0 - 100.0 fL   MCH 32.1 26.0 - 34.0 pg   MCHC 32.7 30.0 - 36.0 g/dL   RDW 25.9 56.3 - 87.5 %   Platelets 320 150 - 400 K/uL   nRBC 0.0 0.0 - 0.2 %   Neutrophils Relative % 84 %   Neutro Abs 15.4 (H) 1.7 - 7.7 K/uL   Lymphocytes Relative 9 %   Lymphs Abs 1.7 0.7 - 4.0 K/uL   Monocytes Relative 5 %   Monocytes Absolute 0.8 0.1 - 1.0 K/uL   Eosinophils Relative 1 %   Eosinophils Absolute 0.2 0.0 - 0.5 K/uL   Basophils Relative 0 %   Basophils Absolute 0.1 0.0 - 0.1 K/uL   Immature  Granulocytes 1 %   Abs Immature Granulocytes 0.11 (H) 0.00 - 0.07 K/uL    Comment: Performed at [facility], [address], [place], [place] [address]  VITAMIN D 25 Hydroxy (Vit-D Deficiency, Fractures)     Status: Abnormal   Collection Time: 11/29/20  3:51 PM  Result Value Ref Range   Vit D, 25-Hydroxy 114.87 (H) 30 - 100 ng/mL    Comment: (NOTE) Vitamin D deficiency has been defined by the Institute of Medicine  and an Endocrine Society practice guideline as a level of serum 25-OH  vitamin D less than 20 ng/mL (1,2). The Endocrine Society went on to  further define vitamin D insufficiency as a level between 21 and 29  ng/mL (2).  1. IOM (Institute of Medicine). 2010. Dietary reference intakes for  calcium and D. [place]: [employer]. 2. Holick MF, Binkley Brady, Bischoff-Ferrari HA, et al. Evaluation,  treatment, and prevention of vitamin D deficiency: an Endocrine  Society clinical practice guideline, JCEM. 2011 Jul; 96(7): 1911-30.  Performed at [facility],  [address], [place], [place] [address]   Type and screen [facility]     Status: None   Collection Time: 11/29/20  3:51 PM  Result Value Ref Range   ABO/RH(D) O NEG    Antibody Screen NEG    Sample Expiration      12/02/2020,2359 Performed at [facility], [address], [place], [place] [address]   CBC     Status: Abnormal   Collection Time: 11/30/20 12:52 AM  Result Value Ref Range   WBC 10.5 4.0 - 10.5 K/uL   RBC 3.48 (L) 3.87 - 5.11 MIL/uL   Hemoglobin 11.1 (L) 12.0 - 15.0 g/dL   HCT 03.5 (L) 46.5 - 68.1 %   MCV 95.1 80.0 - 100.0 fL   MCH 31.9 26.0 - 34.0 pg   MCHC 33.5 30.0 - 36.0 g/dL   RDW 27.5 17.0 - 01.7 %   Platelets 258 150 - 400 K/uL   nRBC 0.0 0.0 - 0.2 %    Comment: Performed at [facility], [address] [place], [place] [address]  Basic metabolic panel     Status: Abnormal   Collection Time: 11/30/20 12:52 AM  Result Value Ref Range   Sodium 140 135 - 145 mmol/L   Potassium 4.5 3.5 - 5.1 mmol/L   Chloride 108 98 - 111 mmol/L   CO2 21 (L) 22 - 32 mmol/L   Glucose, Bld 129 (H) 70 - 99 mg/dL    Comment: Glucose reference range applies only to samples taken after fasting for at least 8 hours.   BUN 23 8 - 23 mg/dL   Creatinine, Ser 6.75 0.44 - 1.00 mg/dL   Calcium 8.9 8.9 - 91.6 mg/dL   GFR, Estimated >38 >46 mL/min    Comment: (NOTE) Calculated using the CKD-EPI Creatinine Equation (2021)    Anion gap 11 5 - 15    Comment: Performed at [facility], [address] [place], [place] [address]    CT Head Wo Contrast  Result Date: 11/29/2020 CLINICAL DATA:  Polytrauma EXAM: CT HEAD WITHOUT CONTRAST CT CERVICAL SPINE WITHOUT CONTRAST TECHNIQUE: Multidetector CT imaging of the head and cervical spine was performed following the standard protocol without intravenous contrast. Multiplanar CT image reconstructions of the cervical spine were also generated. COMPARISON:  CT head dated January 25, 2017 FINDINGS: CT HEAD FINDINGS Brain: No  evidence of acute  infarction, hemorrhage, hydrocephalus, extra-axial collection or mass lesion/mass effect. Chronic white matter ischemic change. Vascular: No hyperdense vessel or unexpected calcification. Skull: Normal. Negative for fracture or focal lesion. Sinuses/Orbits: No acute finding. Other: Mild soft tissue swelling swelling overlying the right frontal bone. CT CERVICAL SPINE FINDINGS Alignment: Mild grade 1 anterolisthesis of C2 on C3 and mild retrolisthesis of C4 on C5, favored to be degenerative. Skull base and vertebrae: No acute fracture. No primary bone lesion or focal pathologic process. Soft tissues and spinal canal: No prevertebral fluid or swelling. No visible canal hematoma. Disc levels:  Multilevel moderate degenerative disc disease. Upper chest: Negative. Other: None IMPRESSION: No acute intracranial abnormality. No evidence of skull or cervical spine fracture. Electronically Signed   By: Allegra LaiLeah  Strickland M.D.   On: 11/29/2020 13:36   CT Cervical Spine Wo Contrast  Result Date: 11/29/2020 CLINICAL DATA:  Polytrauma EXAM: CT HEAD WITHOUT CONTRAST CT CERVICAL SPINE WITHOUT CONTRAST TECHNIQUE: Multidetector CT imaging of the head and cervical spine was performed following the standard protocol without intravenous contrast. Multiplanar CT image reconstructions of the cervical spine were also generated. COMPARISON:  CT head dated January 25, 2017 FINDINGS: CT HEAD FINDINGS Brain: No evidence of acute infarction, hemorrhage, hydrocephalus, extra-axial collection or mass lesion/mass effect. Chronic white matter ischemic change. Vascular: No hyperdense vessel or unexpected calcification. Skull: Normal. Negative for fracture or focal lesion. Sinuses/Orbits: No acute finding. Other: Mild soft tissue swelling swelling overlying the right frontal bone. CT CERVICAL SPINE FINDINGS Alignment: Mild grade 1 anterolisthesis of C2 on C3 and mild retrolisthesis of C4 on C5, favored to be degenerative. Skull  base and vertebrae: No acute fracture. No primary bone lesion or focal pathologic process. Soft tissues and spinal canal: No prevertebral fluid or swelling. No visible canal hematoma. Disc levels:  Multilevel moderate degenerative disc disease. Upper chest: Negative. Other: None IMPRESSION: No acute intracranial abnormality. No evidence of skull or cervical spine fracture. Electronically Signed   By: Allegra LaiLeah  Strickland M.D.   On: 11/29/2020 13:36   CT KNEE RIGHT WO CONTRAST  Result Date: 11/29/2020 CLINICAL DATA:  Fracture, knee EXAM: CT OF THE right KNEE WITHOUT CONTRAST TECHNIQUE: Multidetector CT imaging of the right knee was performed according to the standard protocol. Multiplanar CT image reconstructions were also generated. COMPARISON:  Radiograph 11/30/2018 FINDINGS: Bones/Joint/Cartilage Diffuse osteopenia. There is an impacted fracture through the distal femoral metaphysis, which extends into the knee joint through the intercondylar notch anteriorly. There is half shaft posterior displacement, approximately 1.5 cm, and 1.7 cm lateral displacement. There is lateral angulation. There is a small linear ossific fragment at the expected location of the MCL attachment on the medial femoral condyle (coronal series 5, image 83). There is severe tricompartment osteoarthritis with articular surface irregularity and moderate-severe joint space narrowing. Chondrocalcinosis. Small joint effusion. Patella baja. Ligaments Suboptimally assessed by CT. Muscles and Tendons No intramuscular fluid collection.  Extensive edema. Soft tissues Superficial soft tissue swelling.  No focal fluid collection. IMPRESSION: Displaced, angulated, and impacted distal femoral metaphyseal fracture, with intra-articular extension into the knee joint through the intercondylar notch. Small joint effusion. Diffuse osteopenia. Possible small proximal MCL avulsion fracture. Severe tricompartment osteoarthritis. Electronically Signed   By: Caprice RenshawJacob   Kahn M.D.   On: 11/29/2020 16:22   Chest Portable 1 View  Result Date: 11/29/2020 CLINICAL DATA:  Preoperative. Right knee fracture. History of hypertension EXAM: PORTABLE CHEST 1 VIEW COMPARISON:  Chest x-ray 01/25/17 FINDINGS: The heart and mediastinal contours unchanged.  Aortic calcification.  No focal consolidation. No pulmonary edema. No pleural effusion. No pneumothorax. No acute osseous abnormality. Bilateral shoulder degenerative changes. IMPRESSION: No active disease. Electronically Signed   By: Tish Frederickson M.D.   On: 11/29/2020 16:31   DG Knee Complete 4 Views Right  Result Date: 11/29/2020 CLINICAL DATA:  Fall, pain and swelling of the right knee EXAM: RIGHT KNEE - COMPLETE 4+ VIEW COMPARISON:  None. FINDINGS: Osteopenia. There is a transverse, impacted fracture of the distal right femoral metadiaphysis. There is no definite intercondylar fracture component appreciated. Severe tricompartmental arthrosis. Soft tissue edema anteriorly. IMPRESSION: 1. Transverse, impacted fracture of the distal right femoral metadiaphysis. There is no definite intercondylar fracture component appreciated, however assessment of fracture anatomy is limited by osteopenia. 2. Severe tricompartmental arthrosis. Electronically Signed   By: Lauralyn Primes M.D.   On: 11/29/2020 13:39    Review of Systems  HENT:  Negative for ear discharge, ear pain, hearing loss and tinnitus.   Eyes:  Negative for photophobia and pain.  Respiratory:  Negative for cough and shortness of breath.   Cardiovascular:  Negative for chest pain.  Gastrointestinal:  Negative for abdominal pain, nausea and vomiting.  Genitourinary:  Negative for dysuria, flank pain, frequency and urgency.  Musculoskeletal:  Positive for arthralgias (Right knee). Negative for back pain, myalgias and neck pain.  Neurological:  Negative for dizziness and headaches.  Hematological:  Does not bruise/bleed easily.  Psychiatric/Behavioral:  The patient is not  nervous/anxious.   Blood pressure (!) 128/55, pulse 77, temperature 98.2 F (36.8 C), temperature source Oral, resp. rate 16, height 5\' 4"  (1.626 m), weight 72.6 kg, SpO2 93 %. Physical Exam Constitutional:      General: She is not in acute distress.    Appearance: She is well-developed. She is not diaphoretic.  HENT:     Head: Normocephalic and atraumatic.  Eyes:     General: No scleral icterus.       Right eye: No discharge.        Left eye: No discharge.     Conjunctiva/sclera: Conjunctivae normal.  Cardiovascular:     Rate and Rhythm: Normal rate and regular rhythm.  Pulmonary:     Effort: Pulmonary effort is normal. No respiratory distress.  Musculoskeletal:     Cervical back: Normal range of motion.     Comments: RLE No traumatic wounds, ecchymosis, or rash  KI in place  No ankle effusion  Sens DPN, SPN, TN intact  Motor EHL, ext, flex, evers 5/5  DP 1+, PT 0, No significant edema  Skin:    General: Skin is warm and dry.  Neurological:     Mental Status: She is alert.  Psychiatric:        Mood and Affect: Mood normal.        Behavior: Behavior normal.    Assessment/Plan: Right distal femur fx -- Plan ORIF today by Dr. . Please keep NPO. 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

## 2020-11-30 NOTE — Anesthesia Postprocedure Evaluation (Signed)
Anesthesia Post Note  Patient: Andrea [name]  Procedure(s) Performed: OPEN REDUCTION INTERNAL FIXATION (ORIF) DISTAL FEMUR FRACTURE (Right)     Patient location during evaluation: PACU Anesthesia Type: Spinal Level of consciousness: oriented and awake and alert Pain management: pain level controlled Vital Signs Assessment: post-procedure vital signs reviewed and stable Respiratory status: spontaneous breathing, respiratory function stable and nonlabored ventilation Cardiovascular status: blood pressure returned to baseline and stable Postop Assessment: no headache, no backache, no apparent nausea or vomiting and spinal receding Anesthetic complications: no   No notable events documented.  Last Vitals:  Vitals:   11/30/20 1515 11/30/20 1530  BP: (!) 136/59 132/63  Pulse: 73 78  Resp: 12 18  Temp: (!) 36.2 C   SpO2: 93% 98%    Last Pain:  Vitals:   11/30/20 1515  TempSrc:   PainSc: 0-No pain                 Kelvon Giannini A.

## 2020-11-30 NOTE — Anesthesia Procedure Notes (Signed)
Procedure Name: MAC Date/Time: 11/30/2020 12:58 PM Performed by: Leonor Liv, CRNA Pre-anesthesia Checklist: Patient identified, Emergency Drugs available, Suction available, Patient being monitored and Timeout performed Patient Re-evaluated:Patient Re-evaluated prior to induction Oxygen Delivery Method: Simple face mask Placement Confirmation: positive ETCO2 Dental Injury: Teeth and Oropharynx as per pre-operative assessment

## 2020-11-30 NOTE — TOC CAGE-AID Note (Signed)
Transition of Care [facility]) - CAGE-AID Screening   Patient Details  Name: Andrea [name] MRN: [id number] Date of Birth: 12/16/1925  Transition of [facility]) CM/SW Contact:    Arnitra Sokoloski C Tarpley-Carter, LCSWA Phone Number: 11/30/2020, 11:34 AM   Clinical Narrative: Pt is unable to participate in Cage Aid.  Pt is not appropriate for assessment.  Lafern Brinkley Tarpley-Carter, MSW, LCSW-A Pronouns:  She/Her/Hers [facility] HealthTransitions of Care Clinical Social Worker Direct Number:  [phone] [email or web address]  CAGE-AID Screening: Substance Abuse Screening unable to be completed due to: : Patient unable to participate (Pt is not appropriate for assessment.)

## 2020-11-30 NOTE — Transfer of Care (Signed)
Immediate Anesthesia Transfer of Care Note  Patient: Andrea [name]  Procedure(s) Performed: OPEN REDUCTION INTERNAL FIXATION (ORIF) DISTAL FEMUR FRACTURE (Right)  Patient Location: PACU  Anesthesia Type:Spinal  Level of Consciousness: drowsy and patient cooperative  Airway & Oxygen Therapy: Patient Spontanous Breathing and Patient connected to nasal cannula oxygen  Post-op Assessment: Report given to RN, Post -op Vital signs reviewed and stable and Patient moving all extremities  Post vital signs: Reviewed and stable  Last Vitals:  Vitals Value Taken Time  BP 136/59 11/30/20 1517  Temp    Pulse 65 11/30/20 1518  Resp 13 11/30/20 1518  SpO2 100 % 11/30/20 1518  Vitals shown include unvalidated device data.  Last Pain:  Vitals:   11/30/20 1155  TempSrc:   PainSc: 0-No pain         Complications: No notable events documented.

## 2020-12-01 ENCOUNTER — Encounter (HOSPITAL_COMMUNITY): Payer: Self-pay | Admitting: Orthopedic Surgery

## 2020-12-01 LAB — CBC WITH DIFFERENTIAL/PLATELET
Abs Immature Granulocytes: 0.05 10*3/uL (ref 0.00–0.07)
Basophils Absolute: 0 10*3/uL (ref 0.0–0.1)
Basophils Relative: 0 %
Eosinophils Absolute: 0 10*3/uL (ref 0.0–0.5)
Eosinophils Relative: 0 %
HCT: 29 % — ABNORMAL LOW (ref 36.0–46.0)
Hemoglobin: 9.6 g/dL — ABNORMAL LOW (ref 12.0–15.0)
Immature Granulocytes: 1 %
Lymphocytes Relative: 16 %
Lymphs Abs: 1.5 10*3/uL (ref 0.7–4.0)
MCH: 31.9 pg (ref 26.0–34.0)
MCHC: 33.1 g/dL (ref 30.0–36.0)
MCV: 96.3 fL (ref 80.0–100.0)
Monocytes Absolute: 1 10*3/uL (ref 0.1–1.0)
Monocytes Relative: 11 %
Neutro Abs: 6.9 10*3/uL (ref 1.7–7.7)
Neutrophils Relative %: 72 %
Platelets: 199 10*3/uL (ref 150–400)
RBC: 3.01 MIL/uL — ABNORMAL LOW (ref 3.87–5.11)
RDW: 13.2 % (ref 11.5–15.5)
WBC: 9.6 10*3/uL (ref 4.0–10.5)
nRBC: 0 % (ref 0.0–0.2)

## 2020-12-01 LAB — BASIC METABOLIC PANEL
Anion gap: 9 (ref 5–15)
BUN: 22 mg/dL (ref 8–23)
CO2: 24 mmol/L (ref 22–32)
Calcium: 8.6 mg/dL — ABNORMAL LOW (ref 8.9–10.3)
Chloride: 104 mmol/L (ref 98–111)
Creatinine, Ser: 0.94 mg/dL (ref 0.44–1.00)
GFR, Estimated: 57 mL/min — ABNORMAL LOW (ref >60)
Glucose, Bld: 133 mg/dL — ABNORMAL HIGH (ref 70–99)
Potassium: 4.3 mmol/L (ref 3.5–5.1)
Sodium: 137 mmol/L (ref 135–145)

## 2020-12-01 LAB — VITAMIN D 25 HYDROXY (VIT D DEFICIENCY, FRACTURES): Vit D, 25-Hydroxy: 109.83 ng/mL — ABNORMAL HIGH (ref 30–100)

## 2020-12-01 MED ORDER — ASCORBIC ACID 500 MG PO TABS
1000.0000 mg | ORAL_TABLET | Freq: Every day | ORAL | Status: DC
  Administered 2020-12-01 – 2020-12-05 (×5): 1000 mg via ORAL
  Filled 2020-12-01 (×5): qty 2

## 2020-12-01 MED ORDER — ATORVASTATIN CALCIUM 40 MG PO TABS
40.0000 mg | ORAL_TABLET | Freq: Every day | ORAL | Status: DC
  Administered 2020-12-01 – 2020-12-04 (×4): 40 mg via ORAL
  Filled 2020-12-01 (×4): qty 1

## 2020-12-01 MED ORDER — VITAMIN D 25 MCG (1000 UNIT) PO TABS: 2000.0000 [IU] | ORAL_TABLET | Freq: Two times a day (BID) | ORAL | Status: DC

## 2020-12-01 NOTE — Evaluation (Signed)
Physical Therapy Evaluation Patient Details Name: Andrea [name] MRN: [id number] DOB: Oct 17, 1925 Today's Date: 12/01/2020   History of Present Illness  87yo female who presented on 11/29/20 after a fall at home, found to have R distal femur fracture. Received R femur ORIF on 11/30/20. PMH HTN, CVA, L hip fracture s/p ORIF  Clinical Impression   Patient received in bed, very pleasant and cooperative today. Able to get to EOB with MinA and HOB elevated/use of rails, otherwise actually required +3 to stand while maintaining NWB RLE, and two person assist to scoot laterally into recliner while maintaining precautions. Needed multimodal cues for safety and to maintain precautions throughout session. Left up in recliner positioned to comfort with all needs met, chair alarm active. AMPAC score 9 and consistent with PT recommendations for SNF.     Follow Up Recommendations SNF    Equipment Recommendations  Wheelchair (measurements PT);Wheelchair cushion (measurements PT);3in1 (PT) (drop arm WC and 3 in 1)    Recommendations for Other Services       Precautions / Restrictions Precautions Precautions: Fall Restrictions Weight Bearing Restrictions: Yes RLE Weight Bearing: Non weight bearing      Mobility  Bed Mobility Overal bed mobility: Needs Assistance Bed Mobility: Supine to Sit     Supine to sit: HOB elevated;Min assist     General bed mobility comments: HOB elevated, + rail; MinA for R LE management, otherwise able to scoot around with increased time and effort with max cues for technique/hand placement    Transfers Overall transfer level: Needs assistance Equipment used: Rolling walker (2 wheeled) Transfers: Sit to/from Stand;Lateral/Scoot Transfers Sit to Stand: Mod assist;From elevated surface (ModAx3)        Lateral/Scoot Transfers: Max assist;+2 safety/equipment General transfer comment: ModAx3 (one on either side, third person holding R LE off ground)  to really safely  stand while maintaining NWB RLE- otherwise kept attempting to put weight through R LE desipite max multimodal cues. Able to scoot into recliner with one person in front holding R LE off of the ground/guarding anteriorly and second person in the back helping to scoot hips into recliner  Ambulation/Gait             General Gait Details: unable  Stairs            Wheelchair Mobility    Modified Rankin (Stroke Patients Only)       Balance Overall balance assessment: History of Falls;Needs assistance Sitting-balance support: Bilateral upper extremity supported;Feet supported Sitting balance-Leahy Scale: Good Sitting balance - Comments: statically   Standing balance support: Bilateral upper extremity supported;During functional activity Standing balance-Leahy Scale: Poor Standing balance comment: reliant on BUE support and external assist                             Pertinent Vitals/Pain Pain Assessment: Faces Faces Pain Scale: Hurts little more Pain Location: R LE with mobility Pain Descriptors / Indicators: Aching;Sore;Discomfort Pain Intervention(s): Limited activity within patient's tolerance;Monitored during session    Home Living Family/patient expects to be discharged to:: Private residence Living Arrangements: Alone Available Help at Discharge: Friend(s);Available PRN/intermittently Type of Home: Mobile home Home Access: Ramped entrance     Home Layout: One level Home Equipment: Grab bars - tub/shower;Bedside commode;Walker - 2 wheels;Cane - single point Additional Comments: drives; has 30 outdoor cats she takes care of    Prior Function Level of Independence: Independent with assistive device(s)  Hand Dominance        Extremity/Trunk Assessment   Upper Extremity Assessment Upper Extremity Assessment: Defer to OT evaluation    Lower Extremity Assessment Lower Extremity Assessment: Generalized weakness    Cervical  / Trunk Assessment Cervical / Trunk Assessment: Kyphotic  Communication   Communication: No difficulties  Cognition Arousal/Alertness: Awake/alert Behavior During Therapy: WFL for tasks assessed/performed Overall Cognitive Status: Within Functional Limits for tasks assessed                                 General Comments: possibly HOH? didn't respond to some questions but was appropriate and followed cues well overall      General Comments      Exercises     Assessment/Plan    PT Assessment Patient needs continued PT services  PT Problem List Decreased strength;Decreased knowledge of use of DME;Decreased safety awareness;Decreased activity tolerance;Decreased balance;Decreased knowledge of precautions;Decreased mobility;Pain       PT Treatment Interventions DME instruction;Balance training;Gait training;Cognitive remediation;Functional mobility training;Patient/family education;Therapeutic activities;Therapeutic exercise;Wheelchair mobility training    PT Goals (Current goals can be found in the Care Plan section)  Acute Rehab PT Goals Patient Stated Goal: get more mobile, feed cats PT Goal Formulation: With patient Time For Goal Achievement: 12/15/20 Potential to Achieve Goals: Fair    Frequency Min 3X/week   Barriers to discharge        Co-evaluation               AM-PAC PT "6 Clicks" Mobility  Outcome Measure Help needed turning from your back to your side while in a flat bed without using bedrails?: A Little Help needed moving from lying on your back to sitting on the side of a flat bed without using bedrails?: A Lot Help needed moving to and from a bed to a chair (including a wheelchair)?: Total Help needed standing up from a chair using your arms (e.g., wheelchair or bedside chair)?: Total Help needed to walk in hospital room?: Total Help needed climbing 3-5 steps with a railing? : Total 6 Click Score: 9    End of Session Equipment  Utilized During Treatment: Gait belt Activity Tolerance: Patient tolerated treatment well Patient left: in chair;with call bell/phone within reach;with chair alarm set Nurse Communication: Mobility status PT Visit Diagnosis: Unsteadiness on feet (R26.81);History of falling (Z91.81);Muscle weakness (generalized) (M62.81);Other abnormalities of gait and mobility (R26.89);Difficulty in walking, not elsewhere classified (R26.2)    Time: 7425-9563 PT Time Calculation (min) (ACUTE ONLY): 29 min   Charges:   PT Evaluation $PT Eval Moderate Complexity: 1 Mod (co-eval with OT)         Ann Lions PT, DPT, PN2   Supplemental Physical Therapist [place]    Pager [phone] Acute Rehab Office [phone]

## 2020-12-01 NOTE — Progress Notes (Addendum)
PROGRESS NOTE    Andrea Hatchetancy Kleven  ZOX:[id number] DOB: 08/14/1925 DOA: 11/29/2020 PCP: [facility]   Brief Narrative:  Andrea [name] is a 87 y.o. 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  She came to the ED due to mechanical fall at home.   Upon arrival to ED, hemodynamically stable, slightly elevated WBC at 18.3.  CT head and cervical spine did not show any acute findings.  X-ray of the right knee confirms a distal right femur fracture.  [facility] was consulted at [facility] and reported that they could not treat her at this facility.  [facility]oped[facility] at [facility] was consulted and agreed to care for patient and recommended [facility].  Assessment & Plan:   Principal Problem:   Femur fracture, right [facility](HCC) Active Problems:   Hypothyroidism   Hypertension   Gait disturbance, post-stroke   Status post revision of total hip   Leukocytosis   History of stroke   Hyperlipidemia   GERD (gastroesophageal reflux disease)   Acute right distal femur fracture secondary to fall: S/p ORIF with orthopedics on 11/30/2020.  Management per them.  Pain controlled.  PT OT to see today.   Acute postoperative blood loss anemia: Hemoglobin dropped from 12.8 preoperatively to 9.6 postoperatively but no indication of transfusion.  Monitor daily and transfuse if hemoglobin drops less than 7.   Hypothyroidism: Continue Synthroid.   Essential hypertension: Controlled.  Continue amlodipine.  History of stroke with chronic gait instability/hyperlipidemia: Resume aspirin and continue atorvastatin.  GERD: Continue Pepcid.  DVT prophylaxis: enoxaparin (LOVENOX) injection 40 mg Start: 12/01/20 0800 SCDs Start: 11/30/20 1619   Code Status: Full Code  Family Communication:  None present at bedside.  Plan of care discussed with patient in length and he verbalized understanding and agreed with it.  Status is: Inpatient  Remains inpatient appropriate because:Inpatient level of care appropriate due to severity of illness  Dispo: The patient is from: Home              Anticipated d/c is to: SNF              Patient currently is not medically stable to d/c.   Difficult to place patient No        Estimated body mass index is 27.46 kg/m as calculated from the following:   Height as of this encounter: 5\' 4"  (1.626 m).   Weight as of this encounter: 72.6 kg.    Nutritional Assessment: Body mass index is 27.46 kg/m.Marland Kitchen. Seen by dietician.  I agree with the assessment and plan as outlined below: Nutrition Status: Nutrition Problem: Increased nutrient needs Etiology: post-op healing Signs/Symptoms: estimated needs Interventions: Ensure Enlive (each supplement provides 350kcal and 20 grams of protein), MVI  .  Skin Assessment: I have examined the patient's skin and I agree with the wound assessment as performed by the wound care RN as outlined below:    Consultants:  Orthopedics  Procedures:  ORIF  Antimicrobials:  Anti-infectives (From admission, onward)    Start     Dose/Rate Route Frequency Ordered Stop   11/30/20 1900  ceFAZolin (ANCEF) IVPB 2g/100 mL premix        2 g 200 mL/hr over 30 Minutes Intravenous Every 6 hours 11/30/20 1618 12/01/20 0329   11/30/20 1115  ceFAZolin (  ANCEF) IVPB 2g/100 mL premix        2 g 200 mL/hr over 30 Minutes Intravenous To [facility] 11/30/20 1059 11/30/20 1258          Subjective: Seen and examined.  Doing well.  No complaints.  No pain.  Objective: Vitals:   11/30/20 1545 11/30/20 1600 11/30/20 1621 11/30/20 2100  BP: (!) 132/94 (!) 125/57 117/82 129/64  Pulse: 84 83 91 92  Resp: 16 18 18 18   Temp:  98 F (36.7 C) 98 F (36.7 C) 98.2 F (36.8 C)  TempSrc:   Oral Oral  SpO2: 99% 99%  99% 98%  Weight:      Height:        Intake/Output Summary (Last 24 hours) at 12/01/2020 1100 Last data filed at 11/30/2020 1658 Gross per 24 hour  Intake 1826 ml  Output 650 ml  Net 1176 ml   Filed Weights   11/29/20 1156 11/30/20 1140  Weight: 72.6 kg 72.6 kg    Examination:  General exam: Appears calm and comfortable  Respiratory system: Clear to auscultation. Respiratory effort normal. Cardiovascular system: S1 & S2 heard, RRR. No JVD, murmurs, rubs, gallops or clicks. No pedal edema. Gastrointestinal system: Abdomen is nondistended, soft and nontender. No organomegaly or masses felt. Normal bowel sounds heard. Central nervous system: Alert and oriented. No focal neurological deficits. Extremities: Symmetric 5 x 5 power. Skin: Dressing in place right lower extremity. Psychiatry: Judgement and insight appear normal. Mood & affect appropriate.   Data Reviewed: I have personally reviewed following labs and imaging studies  CBC: Recent Labs  Lab 11/29/20 1313 11/30/20 0052 12/01/20 0245  WBC 18.3* 10.5 9.6  NEUTROABS 15.4*  --  6.9  HGB 12.8 11.1* 9.6*  HCT 39.1 33.1* 29.0*  MCV 98.0 95.1 96.3  PLT 320 258 199    Basic Metabolic Panel: Recent Labs  Lab 11/29/20 1313 11/30/20 0052 12/01/20 0245  NA 141 140 137  K 4.0 4.5 4.3  CL 106 108 104  CO2 28 21* 24  GLUCOSE 145* 129* 133*  BUN 23 23 22   CREATININE 0.92 0.85 0.94  CALCIUM 9.3 8.9 8.6*    GFR: Estimated Creatinine Clearance: 36.5 mL/min (by C-G formula based on SCr of 0.94 mg/dL). Liver Function Tests: No results for input(s): AST, ALT, ALKPHOS, BILITOT, PROT, ALBUMIN in the last 168 hours. No results for input(s): LIPASE, AMYLASE in the last 168 hours. No results for input(s): AMMONIA in the last 168 hours. Coagulation Profile: No results for input(s): INR, PROTIME in the last 168 hours. Cardiac Enzymes: No results for input(s): CKTOTAL, CKMB, CKMBINDEX, TROPONINI in the last 168 hours. BNP (last  3 results) No results for input(s): PROBNP in the last 8760 hours. HbA1C: No results for input(s): HGBA1C in the last 72 hours. CBG: No results for input(s): GLUCAP in the last 168 hours. Lipid Profile: No results for input(s): CHOL, HDL, LDLCALC, TRIG, CHOLHDL, LDLDIRECT in the last 72 hours. Thyroid Function Tests: No results for input(s): TSH, T4TOTAL, FREET4, T3FREE, THYROIDAB in the last 72 hours. Anemia Panel: No results for input(s): VITAMINB12, FOLATE, FERRITIN, TIBC, IRON, RETICCTPCT in the last 72 hours. Sepsis Labs: No results for input(s): PROCALCITON, LATICACIDVEN in the last 168 hours.  Recent Results (from the past 240 hour(s))  SARS CORONAVIRUS 2 (TAT 6-24 HRS) Nasopharyngeal Nasopharyngeal Swab     Status: None   Collection Time: 11/29/20  6:17 PM   Specimen: Nasopharyngeal Swab  Result Value Ref Range Status  SARS Coronavirus 2 NEGATIVE NEGATIVE Final    Comment: (NOTE) SARS-CoV-2 target nucleic acids are NOT DETECTED.  The SARS-CoV-2 RNA is generally detectable in upper and lower respiratory specimens during the acute phase of infection. Negative results do not preclude SARS-CoV-2 infection, do not rule out co-infections with other pathogens, and should not be used as the sole basis for treatment or other patient management decisions. Negative results must be combined with clinical observations, patient history, and epidemiological information. The expected result is Negative.  Fact Sheet for Patients: HairSlick.no  Fact Sheet for Healthcare Providers: quierodirigir.com  This test is not yet approved or cleared by the [place] FDA and  has been authorized for detection and/or diagnosis of SARS-CoV-2 by FDA under an Emergency Use Authorization (EUA). This EUA will remain  in effect (meaning this test can be used) for the duration of the COVID-19 declaration under Se ction 564(b)(1) of the Act, 21  U.S.C. section 360bbb-3(b)(1), unless the authorization is terminated or revoked sooner.  Performed at [facility], [address], [place], [place] [address]   Surgical pcr screen     Status: None   Collection Time: 11/30/20 10:15 AM   Specimen: Nasal Mucosa; Nasal Swab  Result Value Ref Range Status   MRSA, PCR NEGATIVE NEGATIVE Final   Staphylococcus aureus NEGATIVE NEGATIVE Final    Comment: (NOTE) The Xpert SA Assay (FDA approved for NASAL specimens in patients 77 years of age and older), is one component of a comprehensive surveillance program. It is not intended to diagnose infection nor to guide or monitor treatment. Performed at [facility], [address], [place], [place] [address]       Radiology Studies: CT Head Wo Contrast  Result Date: 11/29/2020 CLINICAL DATA:  Polytrauma EXAM: CT HEAD WITHOUT CONTRAST CT CERVICAL SPINE WITHOUT CONTRAST TECHNIQUE: Multidetector CT imaging of the head and cervical spine was performed following the standard protocol without intravenous contrast. Multiplanar CT image reconstructions of the cervical spine were also generated. COMPARISON:  CT head dated January 25, 2017 FINDINGS: CT HEAD FINDINGS Brain: No evidence of acute infarction, hemorrhage, hydrocephalus, extra-axial collection or mass lesion/mass effect. Chronic white matter ischemic change. Vascular: No hyperdense vessel or unexpected calcification. Skull: Normal. Negative for fracture or focal lesion. Sinuses/Orbits: No acute finding. Other: Mild soft tissue swelling swelling overlying the right frontal bone. CT CERVICAL SPINE FINDINGS Alignment: Mild grade 1 anterolisthesis of C2 on C3 and mild retrolisthesis of C4 on C5, favored to be degenerative. Skull base and vertebrae: No acute fracture. No primary bone lesion or focal pathologic process. Soft tissues and spinal canal: No prevertebral fluid or swelling. No visible canal hematoma. Disc levels:  Multilevel moderate  degenerative disc disease. Upper chest: Negative. Other: None IMPRESSION: No acute intracranial abnormality. No evidence of skull or cervical spine fracture. Electronically Signed   By: Allegra Lai M.D.   On: 11/29/2020 13:36   CT Cervical Spine Wo Contrast  Result Date: 11/29/2020 CLINICAL DATA:  Polytrauma EXAM: CT HEAD WITHOUT CONTRAST CT CERVICAL SPINE WITHOUT CONTRAST TECHNIQUE: Multidetector CT imaging of the head and cervical spine was performed following the standard protocol without intravenous contrast. Multiplanar CT image reconstructions of the cervical spine were also generated. COMPARISON:  CT head dated January 25, 2017 FINDINGS: CT HEAD FINDINGS Brain: No evidence of acute infarction, hemorrhage, hydrocephalus, extra-axial collection or mass lesion/mass effect. Chronic white matter ischemic change. Vascular: No hyperdense vessel or unexpected calcification. Skull: Normal. Negative for fracture  or focal lesion. Sinuses/Orbits: No acute finding. Other: Mild soft tissue swelling swelling overlying the right frontal bone. CT CERVICAL SPINE FINDINGS Alignment: Mild grade 1 anterolisthesis of C2 on C3 and mild retrolisthesis of C4 on C5, favored to be degenerative. Skull base and vertebrae: No acute fracture. No primary bone lesion or focal pathologic process. Soft tissues and spinal canal: No prevertebral fluid or swelling. No visible canal hematoma. Disc levels:  Multilevel moderate degenerative disc disease. Upper chest: Negative. Other: None IMPRESSION: No acute intracranial abnormality. No evidence of skull or cervical spine fracture. Electronically Signed   By: Allegra Lai M.D.   On: 11/29/2020 13:36   CT KNEE RIGHT WO CONTRAST  Result Date: 11/29/2020 CLINICAL DATA:  Fracture, knee EXAM: CT OF THE right KNEE WITHOUT CONTRAST TECHNIQUE: Multidetector CT imaging of the right knee was performed according to the standard protocol. Multiplanar CT image reconstructions were also  generated. COMPARISON:  Radiograph 11/30/2018 FINDINGS: Bones/Joint/Cartilage Diffuse osteopenia. There is an impacted fracture through the distal femoral metaphysis, which extends into the knee joint through the intercondylar notch anteriorly. There is half shaft posterior displacement, approximately 1.5 cm, and 1.7 cm lateral displacement. There is lateral angulation. There is a small linear ossific fragment at the expected location of the MCL attachment on the medial femoral condyle (coronal series 5, image 83). There is severe tricompartment osteoarthritis with articular surface irregularity and moderate-severe joint space narrowing. Chondrocalcinosis. Small joint effusion. Patella baja. Ligaments Suboptimally assessed by CT. Muscles and Tendons No intramuscular fluid collection.  Extensive edema. Soft tissues Superficial soft tissue swelling.  No focal fluid collection. IMPRESSION: Displaced, angulated, and impacted distal femoral metaphyseal fracture, with intra-articular extension into the knee joint through the intercondylar notch. Small joint effusion. Diffuse osteopenia. Possible small proximal MCL avulsion fracture. Severe tricompartment osteoarthritis. Electronically Signed   By: Caprice Renshaw M.D.   On: 11/29/2020 16:22   Chest Portable 1 View  Result Date: 11/29/2020 CLINICAL DATA:  Preoperative. Right knee fracture. History of hypertension EXAM: PORTABLE CHEST 1 VIEW COMPARISON:  Chest x-ray 01/25/17 FINDINGS: The heart and mediastinal contours unchanged.  Aortic calcification. No focal consolidation. No pulmonary edema. No pleural effusion. No pneumothorax. No acute osseous abnormality. Bilateral shoulder degenerative changes. IMPRESSION: No active disease. Electronically Signed   By: Tish Frederickson M.D.   On: 11/29/2020 16:31   DG Knee Complete 4 Views Right  Result Date: 11/29/2020 CLINICAL DATA:  Fall, pain and swelling of the right knee EXAM: RIGHT KNEE - COMPLETE 4+ VIEW COMPARISON:   None. FINDINGS: Osteopenia. There is a transverse, impacted fracture of the distal right femoral metadiaphysis. There is no definite intercondylar fracture component appreciated. Severe tricompartmental arthrosis. Soft tissue edema anteriorly. IMPRESSION: 1. Transverse, impacted fracture of the distal right femoral metadiaphysis. There is no definite intercondylar fracture component appreciated, however assessment of fracture anatomy is limited by osteopenia. 2. Severe tricompartmental arthrosis. Electronically Signed   By: Lauralyn Primes M.D.   On: 11/29/2020 13:39   DG Knee Right Port  Result Date: 11/30/2020 CLINICAL DATA:  Postop EXAM: PORTABLE RIGHT KNEE - 1-2 VIEW COMPARISON:  11/29/2020 FINDINGS: Interval surgical plate and multiple screw fixation of comminuted distal femoral fracture with intact hardware and near anatomic alignment. Gas in the soft tissues consistent with recent surgery. Severe degenerative changes at the knee IMPRESSION: Interval surgical fixation of comminuted distal femoral fracture with expected postsurgical change. Electronically Signed   By: Jasmine Pang M.D.   On: 11/30/2020 20:31   DG C-Arm  1-60 Min-No Report  Result Date: 11/30/2020 Fluoroscopy was utilized by the requesting physician.  No radiographic interpretation.   DG FEMUR, MIN 2 VIEWS RIGHT  Result Date: 11/30/2020 CLINICAL DATA:  ORIF right distal femur EXAM: RIGHT FEMUR 2 VIEWS COMPARISON:  CT knee dated 11/29/2020 FLUOROSCOPY TIME:  1 minute 5 seconds 3.98 mGy FINDINGS: Intraoperative fluoroscopic radiographs during lateral compression plate and screw fixation of a comminuted distal femur fracture. Fracture fragments are in near anatomic alignment and position. IMPRESSION: Intraoperative fluoroscopic radiographs during ORIF of a distal femur fracture, as above. Electronically Signed   By: Charline Bills M.D.   On: 11/30/2020 21:14    Scheduled Meds:  acetaminophen  650 mg Oral Q12H   amLODipine  5 mg Oral  Daily   vitamin C  1,000 mg Oral Daily   aspirin EC  81 mg Oral Daily   atorvastatin  20 mg Oral QPM   docusate sodium  100 mg Oral BID   enoxaparin (LOVENOX) injection  40 mg Subcutaneous Q24H   famotidine  20 mg Oral QHS   feeding supplement  237 mL Oral BID BM   levothyroxine  88 mcg Oral Daily   multivitamin with minerals  1 tablet Oral Daily   Continuous Infusions:  sodium chloride 35 mL/hr at 11/30/20 1809     LOS: 2 days   Time spent: 30 minutes   Hughie Closs, MD [facility]  12/01/2020, 11:00 AM   How to contact the [facility] Attending or Consulting provider 7A - 7P or covering provider during after hours 7P -7A, for this patient?  Check the care team in [facility] and look for a) attending/consulting [facility] provider listed and b) the [facility] team listed. Page or secure chat 7A-7P. Log into [email or web address] and use [place]'s universal password to access. If you do not have the password, please contact the hospital operator. Locate the [facility] provider you are looking for under [facility] and page to a number that you can be directly reached. If you still have difficulty reaching the provider, please page the [facility] (Director on Call) for the Hospitalists listed on amion for assistance.

## 2020-12-01 NOTE — Progress Notes (Signed)
Nutrition Follow-up  DOCUMENTATION CODES:   Not applicable  INTERVENTION:   -Continue MVI with minerals daily -Continue Ensure Enlive po BID, each supplement provides 350 kcal and 20 grams of protein   NUTRITION DIAGNOSIS:   Increased nutrient needs related to post-op healing as evidenced by estimated needs.  Ongoing  GOAL:   Patient will meet greater than or equal to 90% of their needs  Progressing   MONITOR:   PO intake, Supplement acceptance, Diet advancement, Labs, Weight trends, Skin, I & O's  REASON FOR ASSESSMENT:   Consult Assessment of nutrition requirement/status, Hip fracture protocol  ASSESSMENT:   Andrea [name] is a 87 y.o. 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  She reports that she was feeding the cats this morning on her front porch when she developed a fall.  She reports that she lost balance and footing on the right leg.  She is adamant that she did not trip for over any of the cats but she does not seem to be 100% sure.  She has been having severe pain involving the right distal femur and inability to ambulate since falling.  She denies having chest pain or shortness of breath.  She denies passing out.  She denies having vision changes.  She reports that she does have friends and acquaintances that live nearby that are able to check on her from time to time and assist her when needed.  9/1- s/p ORIF  Reviewed I/O's: +1.2 L x 24 hours   UOP: 500 ml x 24 hours  Spoke with pt at bedside, who was pleasant and in good spirits today. She shares that she always has a good appetite. Observed breakfast tray; pt consumed about 60% of her meal.   PTA pt reports that she usually eats about 2 meals per day. She was unable  to provide details for diet recall or frequently consumed foods, but shares she consumes mainly meat and vegetables. She still drives, cooks, and grocery shops for herself.   Pt is unsure of UBW. She suspects she may have lost weight gradually over the past year, but unsure how much. She shares that her clothes are fitting looser.   Discussed importance of good meal and supplement intake to promote healing. Pt amenable to continue Ensure supplements.   Pt with generalized fat and muscle depletions, which RD suspects is related to advanced age.   Medications reviewed and include colace and vitamin C.  Labs reviewed.   NUTRITION - FOCUSED PHYSICAL EXAM:  Flowsheet Row Most Recent Value  Orbital Region Moderate depletion  Upper Arm Region Mild depletion  Thoracic and Lumbar Region No depletion  Buccal Region Mild depletion  Temple Region Mild depletion  Clavicle Bone Region Moderate depletion  Clavicle and Acromion Bone Region No depletion  Scapular Bone Region No depletion  Dorsal Hand Mild depletion  Patellar Region No depletion  Anterior Thigh Region No depletion  Posterior Calf Region No depletion  Edema (RD Assessment) Mild  Hair Reviewed  Eyes Reviewed  Mouth Reviewed  Skin Reviewed  Nails Reviewed       Diet Order:   Diet Order             Diet regular Room service appropriate? Yes with Assist; Fluid consistency: Thin  Diet effective now  EDUCATION NEEDS:   No education needs have been identified at this time  Skin:  Skin Assessment: Skin Integrity Issues: Skin Integrity Issues:: Incisions Incisions: closed rt leg  Last BM:  Unknown  Height:   Ht Readings from Last 1 Encounters:  11/30/20 5\' 4"  (1.626 m)    Weight:   Wt Readings from Last 1 Encounters:  11/30/20 72.6 kg    Ideal Body Weight:  54.5 kg  BMI:  Body mass index is 27.46 kg/m.  Estimated Nutritional Needs:   Kcal:  1600-1800  Protein:  85-100  grams  Fluid:  > 1.6 L    01/30/21, RD, LDN, CDCES Registered Dietitian II Certified Diabetes Care and Education Specialist Please refer to [facility] for RD and/or RD on-call/weekend/after hours pager

## 2020-12-01 NOTE — Evaluation (Signed)
Occupational Therapy Evaluation Patient Details Name: Andrea [name] MRN: [id number] DOB: [birth year] Today's Date: 12/01/2020    History of Present Illness 87yo female who presented on 11/29/20 after a fall at home, found to have R distal femur fracture. Received R femur ORIF on 11/30/20. PMH HTN, CVA, L hip fracture s/p ORIF   Clinical Impression   PTA, pt was living alone and was independent with ADLs, IADLs, and was driving; using a RW for functional mobility. Pt currently requiring Min A for UB ADLs, Max A for LB ADLs, and Mod-Max A +2 for functional transfers. Pt very motivated and wanting to return to PLOF. Pt with difficulty maintaining weight bearing status and requiring physical A to maintain NWB in standing. Pt would benefit from further acute OT to facilitate safe dc. Recommend dc to SNF for further OT to optimize safety, independence with ADLs, and return to PLOF.     Follow Up Recommendations  SNF    Equipment Recommendations  Other (comment) (Defer to next venue)    Recommendations for Other Services PT consult     Precautions / Restrictions Precautions Precautions: Fall Restrictions Weight Bearing Restrictions: Yes RLE Weight Bearing: Non weight bearing      Mobility Bed Mobility Overal bed mobility: Needs Assistance Bed Mobility: Supine to Sit     Supine to sit: HOB elevated;Min assist     General bed mobility comments: HOB elevated, + rail; MinA for R LE management, otherwise able to scoot around with increased time and effort with max cues for technique/hand placement    Transfers Overall transfer level: Needs assistance Equipment used: Rolling walker (2 wheeled) Transfers: Sit to/from Stand;Lateral/Scoot Transfers Sit to Stand: Mod assist;From elevated surface (ModAx3)        Lateral/Scoot Transfers: Max assist;+2 safety/equipment General transfer comment: ModAx3 (one on either side, third person holding R LE off ground)  to really safely stand while  maintaining NWB RLE- otherwise kept attempting to put weight through R LE desipite max multimodal cues. Able to scoot into recliner with one person in front holding R LE off of the ground/guarding anteriorly and second person in the back helping to scoot hips into recliner    Balance Overall balance assessment: History of Falls;Needs assistance Sitting-balance support: Bilateral upper extremity supported;Feet supported Sitting balance-Leahy Scale: Good Sitting balance - Comments: statically   Standing balance support: Bilateral upper extremity supported;During functional activity Standing balance-Leahy Scale: Poor Standing balance comment: reliant on BUE support and external assist                           ADL either performed or assessed with clinical judgement   ADL Overall ADL's : Needs assistance/impaired Eating/Feeding: Set up;Sitting   Grooming: Set up;Sitting   Upper Body Bathing: Minimal assistance;Sitting   Lower Body Bathing: Maximal assistance;Sit to/from stand;+2 for physical assistance   Upper Body Dressing : Minimal assistance;Sitting   Lower Body Dressing: Maximal assistance;+2 for physical assistance;Sit to/from stand   Toilet Transfer: Maximal assistance;+2 for physical assistance;+2 for safety/equipment;Transfer board Toilet Transfer Details (indicate cue type and reason): Max A +2 for lateral scoot to drop arm recliner. second person to elevate RLE         Functional mobility during ADLs: Maximal assistance;+2 for physical assistance General ADL Comments: Pt presenting with decreased strength and balance     Vision         Perception     Praxis  Pertinent Vitals/Pain Pain Assessment: Faces Faces Pain Scale: Hurts little more Pain Location: R LE with mobility Pain Descriptors / Indicators: Aching;Sore;Discomfort Pain Intervention(s): Monitored during session;Limited activity within patient's tolerance;Repositioned     Hand  Dominance Right   Extremity/Trunk Assessment Upper Extremity Assessment Upper Extremity Assessment: Generalized weakness   Lower Extremity Assessment Lower Extremity Assessment: Defer to PT evaluation   Cervical / Trunk Assessment Cervical / Trunk Assessment: Kyphotic   Communication Communication Communication: No difficulties   Cognition Arousal/Alertness: Awake/alert Behavior During Therapy: WFL for tasks assessed/performed Overall Cognitive Status: Within Functional Limits for tasks assessed                                 General Comments: possibly HOH? didn't respond to some questions but was appropriate and followed cues well overall   General Comments       Exercises     Shoulder Instructions      Home Living Family/patient expects to be discharged to:: Private residence Living Arrangements: Alone Available Help at Discharge: Friend(s);Available PRN/intermittently Type of Home: Mobile home Home Access: Ramped entrance     Home Layout: One level     Bathroom Shower/Tub: Chief Strategy Officer: Standard Bathroom Accessibility: Yes   Home Equipment: Grab bars - tub/shower;Bedside commode;Walker - 2 wheels;Cane - single point   Additional Comments: drives; has 30 outdoor cats she takes care of      Prior Functioning/Environment Level of Independence: Independent with assistive device(s)                 OT Problem List: Decreased strength;Decreased range of motion;Decreased activity tolerance;Impaired balance (sitting and/or standing);Decreased knowledge of use of DME or AE;Decreased knowledge of precautions;Pain      OT Treatment/Interventions: Self-care/ADL training;Therapeutic exercise;Energy conservation;DME and/or AE instruction;Therapeutic activities;Patient/family education    OT Goals(Current goals can be found in the care plan section) Acute Rehab OT Goals Patient Stated Goal: get more mobile, feed cats OT Goal  Formulation: With patient Time For Goal Achievement: 12/15/20 Potential to Achieve Goals: Good ADL Goals Pt Will Perform Lower Body Dressing: with min assist;with adaptive equipment;sit to/from stand Pt Will Transfer to Toilet: with min assist;with transfer board;bedside commode Pt Will Perform Toileting - Clothing Manipulation and hygiene: with min assist;sitting/lateral leans Additional ADL Goal #1: Pt will perform bed mobility with Supervision in preparation for ADLs  OT Frequency: Min 2X/week   Barriers to D/C:            Co-evaluation              AM-PAC OT "6 Clicks" Daily Activity     Outcome Measure Help from another person eating meals?: None Help from another person taking care of personal grooming?: A Little Help from another person toileting, which includes using toliet, bedpan, or urinal?: A Lot Help from another person bathing (including washing, rinsing, drying)?: A Lot Help from another person to put on and taking off regular upper body clothing?: A Little Help from another person to put on and taking off regular lower body clothing?: A Lot 6 Click Score: 16   End of Session Equipment Utilized During Treatment: Gait belt Nurse Communication: Mobility status  Activity Tolerance: Patient tolerated treatment well Patient left: in chair;with call bell/phone within reach;with chair alarm set  OT Visit Diagnosis: Unsteadiness on feet (R26.81);Other abnormalities of gait and mobility (R26.89);Muscle weakness (generalized) (M62.81);Pain Pain - Right/Left: Right Pain -  part of body: Leg                Time: 0802-2336 OT Time Calculation (min): 32 min Charges:  OT General Charges $OT Visit: 1 Visit OT Evaluation $OT Eval Moderate Complexity: 1 Mod  Jenalyn Girdner MSOT, OTR/L Acute Rehab Pager: [phone] Office: [phone]  Theodoro Grist Cherylene Ferrufino 12/01/2020, 4:44 PM

## 2020-12-01 NOTE — Care Management Important Message (Signed)
Important Message  Patient Details  Name: Andrea [name] MRN: [id number] Date of Birth: 25-Jun-1925   Medicare Important Message Given:  Yes     Cloee Dunwoody 12/01/2020, 3:14 PM

## 2020-12-01 NOTE — Progress Notes (Signed)
[facility]edic [facility]uma [facility] Progress Note  Patient ID: Andrea [name] MRN: [id number] DOB/AGE: 08-Feb-1926 87 y.o.  Subjective:  Doing well No complaints  Pain controlled   ROS As above  Objective:   VITALS:   Vitals:   11/30/20 1545 11/30/20 1600 11/30/20 1621 11/30/20 2100  BP: (!) 132/94 (!) 125/57 117/82 129/64  Pulse: 84 83 91 92  Resp: 16 18 18 18   Temp:  98 F (36.7 C) 98 F (36.7 C) 98.2 F (36.8 C)  TempSrc:   Oral Oral  SpO2: 99% 99% 99% 98%  Weight:      Height:        Estimated body mass index is 27.46 kg/m as calculated from the following:   Height as of this encounter: 5\' 4"  (1.626 m).   Weight as of this encounter: 72.6 kg.   Intake/Output      09/01 0701 09/02 0700 09/02 0701 09/03 0700   I.V. (mL/kg) 1826 (25.2)    IV Piggyback 0    Total Intake(mL/kg) 1826 (25.2)    Urine (mL/kg/hr) 500 (0.3)    Blood 150    Total Output 650    Net +1176           LABS  Results for orders placed or performed during the hospital encounter of 11/29/20 (from the past 24 hour(s))  Surgical pcr screen     Status: None   Collection Time: 11/30/20 10:15 AM   Specimen: Nasal Mucosa; Nasal Swab  Result Value Ref Range   MRSA, PCR NEGATIVE NEGATIVE   Staphylococcus aureus NEGATIVE NEGATIVE  Type and screen [facility]     Status: None   Collection Time: 11/30/20 11:18 AM  Result Value Ref Range   ABO/RH(D) O NEG    Antibody Screen NEG    Sample Expiration      12/03/2020,2359 Performed at [facility], [address] [place], [address] [place]   CBC with Differential/Platelet     Status: Abnormal   Collection Time: 12/01/20  2:45 AM  Result Value Ref Range   WBC 9.6 4.0 - 10.5 K/uL   RBC 3.01 (L) 3.87 - 5.11 MIL/uL   Hemoglobin 9.6 (L) 12.0 - 15.0 g/dL   HCT 93810 (L) 01/31/21 - 17.5 %   MCV 96.3 80.0 - 100.0 fL   MCH 31.9 26.0 - 34.0 pg   MCHC 33.1 30.0 -  36.0 g/dL   RDW 10.2 58.5 - 27.7 %   Platelets 199 150 - 400 K/uL   nRBC 0.0 0.0 - 0.2 %   Neutrophils Relative % 72 %   Neutro Abs 6.9 1.7 - 7.7 K/uL   Lymphocytes Relative 16 %   Lymphs Abs 1.5 0.7 - 4.0 K/uL   Monocytes Relative 11 %   Monocytes Absolute 1.0 0.1 - 1.0 K/uL   Eosinophils Relative 0 %   Eosinophils Absolute 0.0 0.0 - 0.5 K/uL   Basophils Relative 0 %   Basophils Absolute 0.0 0.0 - 0.1 K/uL   Immature Granulocytes 1 %   Abs Immature Granulocytes 0.05 0.00 - 0.07 K/uL  Basic metabolic panel     Status: Abnormal   Collection Time: 12/01/20  2:45 AM  Result Value Ref Range   Sodium 137 135 - 145 mmol/L   Potassium 4.3 3.5 -  5.1 mmol/L   Chloride 104 98 - 111 mmol/L   CO2 24 22 - 32 mmol/L   Glucose, Bld 133 (H) 70 - 99 mg/dL   BUN 22 8 - 23 mg/dL   Creatinine, Ser 6.80 0.44 - 1.00 mg/dL   Calcium 8.6 (L) 8.9 - 10.3 mg/dL   GFR, Estimated 57 (L) >60 mL/min   Anion gap 9 5 - 15     PHYSICAL EXAM:   Gen: sitting up in bed, no acute distress Lungs: Unlabored Ext:       Right lower extremity  Dressings are stable.  Scant strikethrough noted but otherwise clean  TED hoses in place  Extremity is warm  + DP pulse  Motor and sensory functions intact  No DCT   Assessment/Plan: 1 Day Post-Op   Principal Problem:   Femur fracture, right (HCC) Active Problems:   Hypothyroidism   Hypertension   Gait disturbance, post-stroke   Status post revision of total hip   Leukocytosis   History of stroke   Hyperlipidemia   GERD (gastroesophageal reflux disease)   Anti-infectives (From admission, onward)    Start     Dose/Rate Route Frequency Ordered Stop   11/30/20 1900  ceFAZolin (ANCEF) IVPB 2g/100 mL premix        2 g 200 mL/hr over 30 Minutes Intravenous Every 6 hours 11/30/20 1618 12/01/20 0329   11/30/20 1115  ceFAZolin (ANCEF) IVPB 2g/100 mL premix        2 g 200 mL/hr over 30 Minutes Intravenous To ShortStay Surgical 11/30/20 1059 11/30/20 1258      .  POD/HD#: 40  87 year old female s/p fall with right distal femur fracture  -Ground-level fall   -Right distal femur fracture s/p ORIF  Nonweightbearing right leg  Unrestricted range of motion right knee  Dressing changes starting tomorrow as needed  Continue with TED hose  PT and OT evaluations  Ice and elevate for swelling and pain control  PT- please teach HEP for right knee ROM- AROM, PROM. Prone exercises as well. No ROM restrictions.  Quad sets, SLR, LAQ, SAQ, heel slides, stretching  Ankle theraband program, heel cord stretching, toe towel curls, etc  No pillows under bend of knee when at rest, ok to place under heel to help work on extension. Can also use zero knee bone foam if available  - Pain management:  Multimodal  Minimize narcotics  - ABL anemia/Hemodynamics  Currently stable, continue to monitor - Medical issues   Per primary team - DVT/PE prophylaxis:  Recommend Lovenox for 21 days  - ID:   Perioperative antibiotics - Metabolic Bone Disease:  Vitamin D levels are pending but mechanism suggestive of osteoporosis - Activity:  Nonweightbearing right leg otherwise as tolerated with assistance - FEN/GI prophylaxis/Foley/Lines:  Regular diet   Optimize nutrition, appreciate RD consult  - Impediments to fracture healing:  Poor bone quality  Age  - Dispo:  Therapy eval  Will need SNF at discharge  Ortho issues are stable   Weightbearing: NWB RLE Insicional and dressing care: Daily dressing changes with Mepilex starting 12/02/2020 Orthopedic device(s):  Walker and wheelchair Showering: Okay to start bathing and cleaning wounds with soap and water once incisions are dry VTE prophylaxis: Lovenox 40mg  qd  21 days Pain control: Multimodal Bone Health/Optimization: We will start vitamin D supplementation.  Awaiting vitamin D levels but fracture suggestive of osteoporosis.  Also optimize nutritional intake  Follow - up plan: 2 weeks Contact  information: MD,  Montez Morita PA-C   Mearl Latin, PA-C [phone] (C) 12/01/2020, 9:57 AM  [facility] [address] [place] [address] [phone] Val [phone] (F)    After 5pm and on the weekends please log on to Amion, go to orthopaedics and the look under the [facility] Call for the provider(s) on call. You can also call our office at [phone] and then follow the prompts to be connected to the call team.

## 2020-12-02 LAB — CBC
HCT: 24 % — ABNORMAL LOW (ref 36.0–46.0)
Hemoglobin: 8 g/dL — ABNORMAL LOW (ref 12.0–15.0)
MCH: 32.1 pg (ref 26.0–34.0)
MCHC: 33.3 g/dL (ref 30.0–36.0)
MCV: 96.4 fL (ref 80.0–100.0)
Platelets: 193 10*3/uL (ref 150–400)
RBC: 2.49 MIL/uL — ABNORMAL LOW (ref 3.87–5.11)
RDW: 13.2 % (ref 11.5–15.5)
WBC: 9.4 10*3/uL (ref 4.0–10.5)
nRBC: 0 % (ref 0.0–0.2)

## 2020-12-02 LAB — COMPREHENSIVE METABOLIC PANEL
ALT: 10 U/L (ref 0–44)
AST: 31 U/L (ref 15–41)
Albumin: 2.6 g/dL — ABNORMAL LOW (ref 3.5–5.0)
Alkaline Phosphatase: 49 U/L (ref 38–126)
Anion gap: 7 (ref 5–15)
BUN: 26 mg/dL — ABNORMAL HIGH (ref 8–23)
CO2: 25 mmol/L (ref 22–32)
Calcium: 8.4 mg/dL — ABNORMAL LOW (ref 8.9–10.3)
Chloride: 105 mmol/L (ref 98–111)
Creatinine, Ser: 0.91 mg/dL (ref 0.44–1.00)
GFR, Estimated: 59 mL/min — ABNORMAL LOW (ref >60)
Glucose, Bld: 130 mg/dL — ABNORMAL HIGH (ref 70–99)
Potassium: 3.7 mmol/L (ref 3.5–5.1)
Sodium: 137 mmol/L (ref 135–145)
Total Bilirubin: 1 mg/dL (ref 0.3–1.2)
Total Protein: 5 g/dL — ABNORMAL LOW (ref 6.5–8.1)

## 2020-12-02 LAB — CALCITRIOL (1,25 DI-OH VIT D): Vit D, 1,25-Dihydroxy: 59.2 pg/mL (ref 24.8–81.5)

## 2020-12-02 NOTE — Progress Notes (Signed)
PROGRESS NOTE    Andrea [name]  CBJ:[id number] DOB: 06-11-1925 DOA: 11/29/2020 PCP: [facility]   Brief Narrative:  Andrea [name] is a 87 y.o. 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  She came to the ED due to mechanical fall at home.   Upon arrival to ED, hemodynamically stable, slightly elevated WBC at 18.3.  CT head and cervical spine did not show any acute findings.  X-ray of the right knee confirms a distal right femur fracture.  Orthopedics was consulted at [facility] and reported that they could not treat her at this facility.  Orthopedics at [facility] was consulted and agreed to care for patient and recommended transfer to [facility].  Assessment & Plan:   Principal Problem:   Femur fracture, right [facility]) Active Problems:   Hypothyroidism   Hypertension   Gait disturbance, post-stroke   Status post revision of total hip   Leukocytosis   History of stroke   Hyperlipidemia   GERD (gastroesophageal reflux disease)   Acute right distal femur fracture secondary to fall: S/p ORIF with orthopedics on 11/30/2020.  PT OT recommended SNF.  TOC consulted.  Pain controlled.  Management per orthopedics.  Acute postoperative blood loss anemia: Hemoglobin dropped from 12.8 preoperatively to 9.6 postoperatively and 8.0 today.  But no indication of transfusion.  Monitor daily and transfuse if hemoglobin drops less than 7.   Hypothyroidism: Continue Synthroid.   Essential hypertension: Controlled.  Continue amlodipine.  History of stroke with chronic gait instability/hyperlipidemia: Resume aspirin and continue atorvastatin.  GERD: Continue Pepcid.  DVT prophylaxis: enoxaparin (LOVENOX) injection 40 mg Start: 12/01/20 0800 SCDs Start: 11/30/20 1619   Code  Status: Full Code  Family Communication: None present at bedside.  Plan of care discussed with patient in length and he verbalized understanding and agreed with it.  Status is: Inpatient  Remains inpatient appropriate because: Unsafe discharge plan/awaiting SNF placement  Dispo: The patient is from: Home              Anticipated d/c is to: SNF              Patient currently is medically stable to d/c.   Difficult to place patient No        Estimated body mass index is 27.46 kg/m as calculated from the following:   Height as of this encounter: 5\' 4"  (1.626 m).   Weight as of this encounter: 72.6 kg.    Nutritional Assessment: Body mass index is 27.46 kg/m. Seen by dietician.  I agree with the assessment and plan as outlined below: Nutrition Status: Nutrition Problem: Increased nutrient needs Etiology: post-op healing Signs/Symptoms: estimated needs Interventions: Ensure Enlive (each supplement provides 350kcal and 20 grams of protein), MVI  .  Skin Assessment: I have examined the patient's skin and I agree with the wound assessment as performed by the wound care RN as outlined below:    Consultants:  Orthopedics  Procedures:  ORIF  Antimicrobials:  Anti-infectives (From admission, onward)    Start     Dose/Rate Route Frequency Ordered Stop   11/30/20 1900  ceFAZolin (ANCEF) IVPB 2g/100 mL premix        2 g 200 mL/hr over 30 Minutes Intravenous Every 6 hours 11/30/20 1618 12/01/20 0329   11/30/20 1115  ceFAZolin (  ANCEF) IVPB 2g/100 mL premix        2 g 200 mL/hr over 30 Minutes Intravenous To [facility] 11/30/20 1059 11/30/20 1258          Subjective: Seen and examined.  No complaints.  Pain controlled.  Objective: Vitals:   11/30/20 2100 12/01/20 1551 12/01/20 2251 12/02/20 0807  BP: 129/64 (!) 126/51 (!) 127/57 (!) 113/46  Pulse: 92 83 81 77  Resp: 18 18 18 18   Temp: 98.2 F (36.8 C) 98.6 F (37 C) 98.3 F (36.8 C) 98.9 F (37.2 C)   TempSrc: Oral Oral Oral Oral  SpO2: 98% 100% 96% 98%  Weight:      Height:        Intake/Output Summary (Last 24 hours) at 12/02/2020 1103 Last data filed at 12/02/2020 0900 Gross per 24 hour  Intake 240 ml  Output --  Net 240 ml    Filed Weights   11/29/20 1156 11/30/20 1140  Weight: 72.6 kg 72.6 kg    Examination:  General exam: Appears calm and comfortable  Respiratory system: Clear to auscultation. Respiratory effort normal. Cardiovascular system: S1 & S2 heard, RRR. No JVD, murmurs, rubs, gallops or clicks. No pedal edema. Gastrointestinal system: Abdomen is nondistended, soft and nontender. No organomegaly or masses felt. Normal bowel sounds heard. Central nervous system: Alert and oriented. No focal neurological deficits. Skin: No rashes, lesions or ulcers.  Psychiatry: Judgement and insight appear normal. Mood & affect appropriate.   Data Reviewed: I have personally reviewed following labs and imaging studies  CBC: Recent Labs  Lab 11/29/20 1313 11/30/20 0052 12/01/20 0245 12/02/20 0148  WBC 18.3* 10.5 9.6 9.4  NEUTROABS 15.4*  --  6.9  --   HGB 12.8 11.1* 9.6* 8.0*  HCT 39.1 33.1* 29.0* 24.0*  MCV 98.0 95.1 96.3 96.4  PLT 320 258 199 193    Basic Metabolic Panel: Recent Labs  Lab 11/29/20 1313 11/30/20 0052 12/01/20 0245 12/02/20 0148  NA 141 140 137 137  K 4.0 4.5 4.3 3.7  CL 106 108 104 105  CO2 28 21* 24 25  GLUCOSE 145* 129* 133* 130*  BUN 23 23 22  26*  CREATININE 0.92 0.85 0.94 0.91  CALCIUM 9.3 8.9 8.6* 8.4*    GFR: Estimated Creatinine Clearance: 37.7 mL/min (by C-G formula based on SCr of 0.91 mg/dL). Liver Function Tests: Recent Labs  Lab 12/02/20 0148  AST 31  ALT 10  ALKPHOS 49  BILITOT 1.0  PROT 5.0*  ALBUMIN 2.6*   No results for input(s): LIPASE, AMYLASE in the last 168 hours. No results for input(s): AMMONIA in the last 168 hours. Coagulation Profile: No results for input(s): INR, PROTIME in the last 168  hours. Cardiac Enzymes: No results for input(s): CKTOTAL, CKMB, CKMBINDEX, TROPONINI in the last 168 hours. BNP (last 3 results) No results for input(s): PROBNP in the last 8760 hours. HbA1C: No results for input(s): HGBA1C in the last 72 hours. CBG: No results for input(s): GLUCAP in the last 168 hours. Lipid Profile: No results for input(s): CHOL, HDL, LDLCALC, TRIG, CHOLHDL, LDLDIRECT in the last 72 hours. Thyroid Function Tests: No results for input(s): TSH, T4TOTAL, FREET4, T3FREE, THYROIDAB in the last 72 hours. Anemia Panel: No results for input(s): VITAMINB12, FOLATE, FERRITIN, TIBC, IRON, RETICCTPCT in the last 72 hours. Sepsis Labs: No results for input(s): PROCALCITON, LATICACIDVEN in the last 168 hours.  Recent Results (from the past 240 hour(s))  SARS CORONAVIRUS 2 (TAT 6-24 HRS) Nasopharyngeal Nasopharyngeal  Swab     Status: None   Collection Time: 11/29/20  6:17 PM   Specimen: Nasopharyngeal Swab  Result Value Ref Range Status   SARS Coronavirus 2 NEGATIVE NEGATIVE Final    Comment: (NOTE) SARS-CoV-2 target nucleic acids are NOT DETECTED.  The SARS-CoV-2 RNA is generally detectable in upper and lower respiratory specimens during the acute phase of infection. Negative results do not preclude SARS-CoV-2 infection, do not rule out co-infections with other pathogens, and should not be used as the sole basis for treatment or other patient management decisions. Negative results must be combined with clinical observations, patient history, and epidemiological information. The expected result is Negative.  Fact Sheet for Patients: HairSlick.no  Fact Sheet for Healthcare Providers: quierodirigir.com  This test is not yet approved or cleared by the [place] FDA and  has been authorized for detection and/or diagnosis of SARS-CoV-2 by FDA under an Emergency Use Authorization (EUA). This EUA will remain  in  effect (meaning this test can be used) for the duration of the COVID-19 declaration under Se ction 564(b)(1) of the Act, 21 U.S.C. section 360bbb-3(b)(1), unless the authorization is terminated or revoked sooner.  Performed at [facility], [address], [place], [place] [address]   Surgical pcr screen     Status: None   Collection Time: 11/30/20 10:15 AM   Specimen: Nasal Mucosa; Nasal Swab  Result Value Ref Range Status   MRSA, PCR NEGATIVE NEGATIVE Final   Staphylococcus aureus NEGATIVE NEGATIVE Final    Comment: (NOTE) The Xpert SA Assay (FDA approved for NASAL specimens in patients 109 years of age and older), is one component of a comprehensive surveillance program. It is not intended to diagnose infection nor to guide or monitor treatment. Performed at [facility], [address], [place], [place] [address]       Radiology Studies: DG Knee Right Port  Result Date: 11/30/2020 CLINICAL DATA:  Postop EXAM: PORTABLE RIGHT KNEE - 1-2 VIEW COMPARISON:  11/29/2020 FINDINGS: Interval surgical plate and multiple screw fixation of comminuted distal femoral fracture with intact hardware and near anatomic alignment. Gas in the soft tissues consistent with recent surgery. Severe degenerative changes at the knee IMPRESSION: Interval surgical fixation of comminuted distal femoral fracture with expected postsurgical change. Electronically Signed   By: Jasmine Pang M.D.   On: 11/30/2020 20:31   DG C-Arm 1-60 Min-No Report  Result Date: 11/30/2020 Fluoroscopy was utilized by the requesting physician.  No radiographic interpretation.   DG FEMUR, MIN 2 VIEWS RIGHT  Result Date: 11/30/2020 CLINICAL DATA:  ORIF right distal femur EXAM: RIGHT FEMUR 2 VIEWS COMPARISON:  CT knee dated 11/29/2020 FLUOROSCOPY TIME:  1 minute 5 seconds 3.98 mGy FINDINGS: Intraoperative fluoroscopic radiographs during lateral compression plate and screw fixation of a comminuted distal femur fracture.  Fracture fragments are in near anatomic alignment and position. IMPRESSION: Intraoperative fluoroscopic radiographs during ORIF of a distal femur fracture, as above. Electronically Signed   By: Charline Bills M.D.   On: 11/30/2020 21:14    Scheduled Meds:  amLODipine  5 mg Oral Daily   vitamin C  1,000 mg Oral Daily   aspirin EC  81 mg Oral Daily   atorvastatin  40 mg Oral q1800   docusate sodium  100 mg Oral BID   enoxaparin (LOVENOX) injection  40 mg Subcutaneous Q24H   famotidine  20 mg Oral QHS   feeding supplement  237 mL Oral BID BM   levothyroxine  88 mcg Oral Daily   multivitamin with minerals  1 tablet Oral Daily   Continuous Infusions:  sodium chloride 35 mL/hr at 11/30/20 1809     LOS: 3 days   Time spent: 26 minutes   Hughie Closs, MD [facility]  12/02/2020, 11:03 AM   How to contact the [facility] Attending or Consulting provider 7A - 7P or covering provider during after hours 7P -7A, for this patient?  Check the care team in [facility] and look for a) attending/consulting TR[facility] provider listed and b) the [facility] team listed. Page or secure chat 7A-7P. Log into [email or web address] and use [facility] universal password to access. If you do not have the password, please contact the hospital operator. Locate the [facility] provider you are looking for under [facility] and page to a number that you can be directly reached. If you still have difficulty reaching the provider, please page the [facility] (Director on Call) for the Hospitalists listed on amion for assistance.

## 2020-12-02 NOTE — Plan of Care (Signed)
  Problem: Education: Goal: Knowledge of General Education information will improve Description Including pain rating scale, medication(s)/side effects and non-pharmacologic comfort measures Outcome: Progressing   Problem: Nutrition: Goal: Adequate nutrition will be maintained Outcome: Progressing   Problem: Pain Managment: Goal: General experience of comfort will improve Outcome: Progressing   

## 2020-12-02 NOTE — Op Note (Signed)
11/30/2020  9:57 PM  PATIENT:  Andrea [name]  87 y.o. female  PRE-OPERATIVE DIAGNOSIS:  Right distal femur fracture with intracondylar extension  POST-OPERATIVE DIAGNOSIS:  Right distal femur fracture with intracondylar extension  PROCEDURE:  Procedure(s): OPEN REDUCTION INTERNAL FIXATION (ORIF) DISTAL FEMUR FRACTURE (Right) with INTRACONDYLAR EXTENSION  SURGEON:  Surgeon(s) and Role:    Myrene Galas, MD - Primary  PHYSICIAN ASSISTANT: Montez Morita, PA-C  ANESTHESIA:   spinal  I/O:  No intake/output data recorded.  SPECIMEN:  No Specimen  TOURNIQUET:  * No tourniquets in log *  COMPLICATIONS: NONE  DICTATION: .Note written in EPIC  DISPOSITION: TO PACU  CONDITION: STABLE  DELAY START OF DVT PROPHYLAXIS BECAUSE OF BLEEDING RISK: NO  BRIEF SUMMARY OF INDICATION FOR PROCEDURE:  Andrea [name] is a pleasant 87 y.o. who sustained severe right knee injury in ground level fall. The patient now presents for definitive repair of this comminuted supracondylar femur fracture with intracondylar comminution of the trochlea between the condyles with profound underlying arthritis. After reviewing with my total joint colleagues the options and their consensus recommendation for repair, I did discuss with patient the risks and benefits of surgery including the possibility of infection, nerve injury, vessel injury, DVT/ PE, loss of motion, arthritis, symptomatic hardware, malunion, nonunion, and need for further surgery among others.  After full discussion, consent was given to proceed.  BRIEF SUMMARY OF PROCEDURE:  The patient was taken to the operating room Where spinal anesthesia was induced.  The right lower extremity was prepped and draped in usual sterile fashion.  No tourniquet was used during the procedure.  C-arm was brought in to mark the starting point on AP and lateral images distally.  Incision was then made.  Dissection was carried carefully down to the retinaculum, which  was incised and divided.  The fracture site was never formally visualized in order to maintain maximum blood supply and biologic advantage. Distraction of the fracture site and derotation back to length was followed by placement of a 2.0 mm K-wire into the lateral femoral condyle and across parallel with end of the joint. Additional towel bumps were placed underneath this after removal of the fixator to achieve appropriate alignment and translation on the AP and lateral views.  We were careful to watch for rotation throughout.  The 9-hole Biomet NCB plate was then advanced after achieving appropriate reduction through the distal incision proximally.  Once we were satisfied with position on AP and lateral images, a pin was placed distally and then a single screw proximally.  This was followed by additional K-wire fixation and additional screw.  We then brought the knee into full extension and checked the rotation and alignment dialing this in.  This was followed by additional bicortical screw fixation proximally such that we ended with 4 bicortical screws and 5 screws into the supracondylar region distally.  Wounds were irrigated thoroughly.  C-arm was brought in to confirm appropriate reduction, hardware placement, trajectory and length. All wounds were irrigated thoroughly and then closed in standard layered fashion using #1 Vicryl, #0 Vicryl, 2-0 Vicryl, and 2-0 nylon.  A gently compressive dressing was applied from foot to thigh and then knee immobilizer.The patient was taken to PACU in stable condition.  Montez Morita, PA-C, assisted me throughout and required to effectively produce, control, and maintain the reduction during provisional and definitive internal fixation. He also assisted with wound closure.  PROGNOSIS:  The underlying arthritis and joint involvement significantly increase the risks of loss  of motion and painful arthritis. Patient will be nonweightbearing on the operative extremity  with early mobilization encouraged. Pharmacologic DVT prophylaxis will be with Lovenox. No bracing will be necessary.     Andrea [name]. Andrea [name], M.D.

## 2020-12-02 NOTE — NC FL2 (Signed)
[place] MEDICAID FL2 LEVEL OF CARE SCREENING TOOL     IDENTIFICATION  Patient Name: Andrea [name] Birthdate: [birth year] Sex: female Admission Date (Current Location): 11/29/2020  [facility] and [place] Number:  Recruitment consultant and Address:  [facility] [place], [address], [place], [place] [address]      Provider Number: [id number]  Attending Physician Name and Address:  Hughie Closs, MD  Relative Name and Phone Number:  None    Current Level of Care: Hospital Recommended Level of Care: Skilled Nursing Facility Prior Approval Number:    Date Approved/Denied:   PASRR Number: [id number]  Discharge Plan:      Current Diagnoses: Patient Active Problem List   Diagnosis Date Noted   Femur fracture, right (HCC) 11/29/2020   Leukocytosis 11/29/2020   History of stroke 11/29/2020   Hyperlipidemia 11/29/2020   GERD (gastroesophageal reflux disease) 11/29/2020   Contracture of right knee 03/06/2017   Gait disturbance, post-stroke 03/06/2017   Right thalamic infarction (HCC) 01/28/2017   Thalamic stroke (HCC)    TIA (transient ischemic attack) 01/25/2017   Hypernatremia 01/25/2017   CVA (cerebral vascular accident) (HCC) 01/25/2017   Trigger middle finger of left hand 03/20/2016   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   Hypothyroidism 09/01/2014   Hypertension 09/01/2014    Orientation RESPIRATION BLADDER Height & Weight     Self, Time, Situation, Place  Normal Incontinent, External catheter Weight: 160 lb (72.6 kg) Height:  5\' 4"  (162.6 cm)  BEHAVIORAL SYMPTOMS/MOOD NEUROLOGICAL BOWEL NUTRITION STATUS      Continent Diet (See dc summary)  AMBULATORY STATUS COMMUNICATION OF NEEDS Skin   Limited Assist Verbally Normal                       Personal Care Assistance Level of Assistance  Bathing, Feeding, Dressing Bathing Assistance: Limited assistance Feeding assistance: Independent Dressing Assistance: Limited assistance      Functional Limitations Info  Sight, Hearing, Speech Sight Info: Adequate Hearing Info: Adequate Speech Info: Adequate    SPECIAL CARE FACTORS FREQUENCY  PT (By licensed PT), OT (By licensed OT)     PT Frequency: 5X WEEK OT Frequency: 5X WEEK            Contractures Contractures Info: Not present    Additional Factors Info  Code Status, Allergies Code Status Info: Full Allergies Info: NKA           Current Medications (12/02/2020):  This is the current hospital active medication list Current Facility-Administered Medications  Medication Dose Route Frequency Provider Last Rate Last Admin   0.9 %  sodium chloride infusion   Intravenous Continuous 02/01/2021, PA-C 35 mL/hr at 11/30/20 1809 New Bag at 11/30/20 1809   acetaminophen (TYLENOL) tablet 325-650 mg  325-650 mg Oral Q6H PRN 01/30/21, PA-C       amLODipine (NORVASC) tablet 5 mg  5 mg Oral Daily Montez Morita, PA-C   5 mg at 12/02/20 1146   ascorbic acid (VITAMIN C) tablet 1,000 mg  1,000 mg Oral Daily 02/01/21, PA-C   1,000 mg at 12/02/20 1146   aspirin EC tablet 81 mg  81 mg Oral Daily 02/01/21, PA-C   81 mg at 12/02/20 1146   atorvastatin (LIPITOR) tablet 40 mg  40 mg Oral q1800 02/01/21, MD   40 mg at 12/01/20 1722   bisacodyl (DULCOLAX) EC tablet 5 mg  5 mg Oral Daily PRN 01/31/21,  PA-C       docusate sodium (COLACE) capsule 100 mg  100 mg Oral BID Montez Morita, PA-C   100 mg at 12/02/20 1146   enoxaparin (LOVENOX) injection 40 mg  40 mg Subcutaneous Q24H Montez Morita, PA-C   40 mg at 12/02/20 0830   famotidine (PEPCID) tablet 20 mg  20 mg Oral QHS Montez Morita, PA-C   20 mg at 12/01/20 2223   feeding supplement (ENSURE ENLIVE / ENSURE PLUS) liquid 237 mL  237 mL Oral BID BM Montez Morita, PA-C   237 mL at 12/01/20 1329   HYDROcodone-acetaminophen (NORCO/VICODIN) 5-325 MG per tablet 1-2 tablet  1-2 tablet Oral Q6H PRN Montez Morita, PA-C   1 tablet at 12/01/20 2223   levothyroxine (SYNTHROID) tablet 88 mcg   88 mcg Oral Daily Montez Morita, PA-C   88 mcg at 12/02/20 6073   menthol-cetylpyridinium (CEPACOL) lozenge 3 mg  1 lozenge Oral PRN Montez Morita, PA-C       Or   phenol (CHLORASEPTIC) mouth spray 1 spray  1 spray Mouth/Throat PRN Montez Morita, PA-C       metoCLOPramide (REGLAN) tablet 5-10 mg  5-10 mg Oral Q8H PRN Montez Morita, PA-C       Or   metoCLOPramide (REGLAN) injection 5-10 mg  5-10 mg Intravenous Q8H PRN Montez Morita, PA-C       morphine 2 MG/ML injection 0.5 mg  0.5 mg Intravenous Q2H PRN Montez Morita, PA-C   0.5 mg at 11/30/20 1839   multivitamin with minerals tablet 1 tablet  1 tablet Oral Daily Montez Morita, PA-C   1 tablet at 12/02/20 1146   ondansetron (ZOFRAN) tablet 4 mg  4 mg Oral Q6H PRN Montez Morita, PA-C   4 mg at 11/30/20 2103   Or   ondansetron (ZOFRAN) injection 4 mg  4 mg Intravenous Q6H PRN Montez Morita, PA-C         Discharge Medications: Please see discharge summary for a list of discharge medications.  Relevant Imaging Results:  Relevant Lab Results:   Additional Information SSN [id number]  Carmina Miller, LCSWA

## 2020-12-02 NOTE — TOC Initial Note (Signed)
Transition of Care [facility]) - Initial/Assessment Note    Patient Details  Name: Andrea [name] MRN: [id number] Date of Birth: 12/07/1925  Transition of Care [facility]) CM/SW Contact:    Carmina Miller, LCSWA Phone Number: 12/02/2020, 12:25 PM  Clinical Narrative:                 CSW received consult for possible SNF placement at time of discharge. CSW spoke with patient by phone. Patient initially reported she would follow any recommendations that the MD made, then stated that she wanted to do rehab in the hospital. 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 Patient expressed understanding of PT recommendation abut would like to think about SNF placement. Patient reports preference for SNFs in the [place] area but was not agreeable to go back to [facility]. CSW discussed insurance authorization process and will provide Medicare SNF ratings list. Patient has not received  COVID vaccines. Patient continued to request CIR even though she has no support at home. No further questions reported at this time.   Skilled Nursing Rehab Facilities-   [email or web address] Ratings out of 5 possible    Name Address  Phone # Quality Care Staffing Health Inspection Overall  [facility] [address], [place] [phone] 5 1 4 4   [facility]  [address] [place], [place] [phone] 3 1 5 4   [facility] [address] [place], [address] [place] 3 1 1 1   [facility] [address] 2 2 4 4   [facility] [address] [phone] 3 1 2 1   [facility] [phone] [address] [phone] 3 2 4 4   [facility] [address] [place] 5 1 2 2   [facility] [address] [facility] [place] 5 2 2 3   [facility] [address], [facility] 5 1 2 2   [facility] [phone] [address], [phone] 5 1 2 2   [facility] [address] [facility] [phone] 5 1 2 2   [facility]. [address] 3 1 1 1            Patient Goals and CMS Choice        Expected Discharge Plan and Services                                                Prior Living Arrangements/Services                       Activities of Daily Living Home Assistive Devices/Equipment: [phone] (specify type) ADL Screening (condition at time of admission) Patient's cognitive ability adequate to safely complete daily activities?: Yes Is the patient deaf or have difficulty hearing?: No Does the patient have difficulty seeing, even when wearing glasses/contacts?: No Does the patient have difficulty concentrating, remembering, or making decisions?: No Patient able to express need for assistance with ADLs?: Yes Does the patient have difficulty dressing or bathing?: No Independently performs ADLs?: Yes (appropriate for developmental age) Does the patient have difficulty walking or climbing stairs?: Yes Weakness of Legs: Both Weakness of Arms/Hands: None  Permission Sought/Granted  Emotional Assessment              Admission diagnosis:  Femur fracture, right (HCC) [S72.91XA] Injury of head, initial encounter [S09.90XA] Fall, initial encounter [W19.XXXA] Closed bicondylar fracture of distal femur, right, initial encounter (HCC) [G92.119E, S72.431A] Patient Active Problem List   Diagnosis Date Noted   Femur fracture, right (HCC) 11/29/2020   Leukocytosis 11/29/2020   History of stroke 11/29/2020   Hyperlipidemia 11/29/2020   GERD (gastroesophageal reflux disease) 11/29/2020   Contracture of right knee 03/06/2017   Gait disturbance, post-stroke 03/06/2017   Right thalamic infarction (HCC) 01/28/2017   Thalamic  stroke (HCC)    TIA (transient ischemic attack) 01/25/2017   Hypernatremia 01/25/2017   CVA (cerebral vascular accident) (HCC) 01/25/2017   Trigger middle finger of left hand 03/20/2016   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 Pharmacy:   [facility] [place], [place] - [address] [facility] [address] [place] [address] Phone: [phone] Fax: [phone]     Social Determinants of Health (SDOH) Interventions    Readmission Risk Interventions No flowsheet data found.

## 2020-12-02 NOTE — Progress Notes (Signed)
  ORTHOPAEDIC PROGRESS NOTE  s/p Procedure(s): OPEN REDUCTION INTERNAL FIXATION (ORIF) DISTAL FEMUR FRACTURE  SUBJECTIVE: Reports mild pain about operative site. Feels tired today.  OBJECTIVE: PE: RLE: dressing CDI. warm well perfused foot, intact EHL/TA/GSC.   Vitals:   12/02/20 1600 12/02/20 1836  BP: (!) 111/96 129/63  Pulse: 83 99  Resp:  18  Temp: 98.5 F (36.9 C) (!) 97 F (36.1 C)  SpO2: 100% 99%     ASSESSMENT: Andrea [name] is a 87 y.o. female POD#2  PLAN: Weightbearing: NWB RLE Insicional and dressing care: Reinforce as needed Orthopedic device(s):  Walker and wheelchair Showering: Okay to start bathing and cleaning wounds with soap and water once incisions are dry VTE prophylaxis: Lovenox 40mg  qd  21 days Pain control: Multimodal Follow - up plan: 2 weeks in office Dispo: PT/OT recommending SNF Contact information:   After hours and holidays please check [email or web address] for group call information for [facility]   12/02/2020, 6:49 PM

## 2020-12-03 LAB — CBC
HCT: 22.8 % — ABNORMAL LOW (ref 36.0–46.0)
Hemoglobin: 7.6 g/dL — ABNORMAL LOW (ref 12.0–15.0)
MCH: 32.2 pg (ref 26.0–34.0)
MCHC: 33.3 g/dL (ref 30.0–36.0)
MCV: 96.6 fL (ref 80.0–100.0)
Platelets: 215 10*3/uL (ref 150–400)
RBC: 2.36 MIL/uL — ABNORMAL LOW (ref 3.87–5.11)
RDW: 13.3 % (ref 11.5–15.5)
WBC: 8.9 10*3/uL (ref 4.0–10.5)
nRBC: 0 % (ref 0.0–0.2)

## 2020-12-03 LAB — CALCIUM, IONIZED: Calcium, Ionized, Serum: 4.8 mg/dL (ref 4.5–5.6)

## 2020-12-03 NOTE — Plan of Care (Signed)
Pt is alert and oriented x4. Turned q2 hours. Received 1 dose of Norco this shift.  Problem: Education: Goal: Knowledge of General Education information will improve Description: Including pain rating scale, medication(s)/side effects and non-pharmacologic comfort measures Outcome: Progressing   Problem: Health Behavior/Discharge Planning: Goal: Ability to manage health-related needs will improve Outcome: Progressing   Problem: Clinical Measurements: Goal: Ability to maintain clinical measurements within normal limits will improve Outcome: Progressing Goal: Will remain free from infection Outcome: Progressing Goal: Diagnostic test results will improve Outcome: Progressing Goal: Respiratory complications will improve Outcome: Progressing Goal: Cardiovascular complication will be avoided Outcome: Progressing   Problem: Activity: Goal: Risk for activity intolerance will decrease Outcome: Progressing   Problem: Nutrition: Goal: Adequate nutrition will be maintained Outcome: Progressing   Problem: Coping: Goal: Level of anxiety will decrease Outcome: Progressing   Problem: Elimination: Goal: Will not experience complications related to bowel motility Outcome: Progressing Goal: Will not experience complications related to urinary retention Outcome: Progressing   Problem: Pain Managment: Goal: General experience of comfort will improve Outcome: Progressing   Problem: Safety: Goal: Ability to remain free from injury will improve Outcome: Progressing   Problem: Skin Integrity: Goal: Risk for impaired skin integrity will decrease Outcome: Progressing

## 2020-12-03 NOTE — Progress Notes (Signed)
PROGRESS NOTE    Andrea [name]  IHK:[id number] DOB: [birth year] DOA: 11/29/2020 PCP: [facility]   Brief Narrative:  Andrea [name] is a 87 y.o. 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  She came to the ED due to mechanical fall at home.   Upon arrival to ED, hemodynamically stable, slightly elevated WBC at 18.3.  CT head and cervical spine did not show any acute findings.  X-ray of the right knee confirms a distal right femur fracture.  Orthopedics was consulted at [facility] and reported that they could not treat her at this facility.  Orthopedics at [facility] was consulted and agreed to care for patient and recommended transfer to [facility].  Assessment & Plan:   Principal Problem:   Femur fracture, right [facility]) Active Problems:   Hypothyroidism   Hypertension   Gait disturbance, post-stroke   Status post revision of total hip   Leukocytosis   History of stroke   Hyperlipidemia   GERD (gastroesophageal reflux disease)   Acute right distal femur fracture secondary to fall: S/p ORIF with orthopedics on 11/30/2020.  PT OT recommended SNF.  TOC consulted.  Pain controlled.  Management per orthopedics.  Acute postoperative blood loss anemia: Hemoglobin dropped from 12.8 preoperatively to 9.6 postoperatively and 7.6 today.  But no indication of transfusion.  Monitor daily and transfuse if hemoglobin drops less than 7.   Hypothyroidism: Continue Synthroid.   Essential hypertension: Controlled.  Continue amlodipine.  History of stroke with chronic gait instability/hyperlipidemia: Resume aspirin and continue atorvastatin.  GERD: Continue Pepcid.  Placement: According to [facility] note, patient wanted to go to CIR however I did discuss with her today and she is  willing to go to SNF.  TOC informed.  DVT prophylaxis: enoxaparin (LOVENOX) injection 40 mg Start: 12/01/20 0800 SCDs Start: 11/30/20 1619   Code Status: Full Code  Family Communication: None present at bedside.  Plan of care discussed with patient in length and he verbalized understanding and agreed with it.  Status is: Inpatient  Remains inpatient appropriate because: Unsafe discharge plan/awaiting SNF placement  Dispo: The patient is from: Home              Anticipated d/c is to: SNF              Patient currently is medically stable to d/c.   Difficult to place patient No        Estimated body mass index is 27.46 kg/m as calculated from the following:   Height as of this encounter: 5\' 4"  (1.626 m).   Weight as of this encounter: 72.6 kg.    Nutritional Assessment: Body mass index is 27.46 kg/m. Seen by dietician.  I agree with the assessment and plan as outlined below: Nutrition Status: Nutrition Problem: Increased nutrient needs Etiology: post-op healing Signs/Symptoms: estimated needs Interventions: Ensure Enlive (each supplement provides 350kcal and 20 grams of protein), MVI  .  Skin Assessment: I have examined the patient's skin and I agree with the wound assessment as performed by the wound care RN as outlined below:    Consultants:  Orthopedics  Procedures:  ORIF  Antimicrobials:  Anti-infectives (From admission, onward)    Start     Dose/Rate Route Frequency Ordered Stop   11/30/20 1900  ceFAZolin (ANCEF) IVPB 2g/100  mL premix        2 g 200 mL/hr over 30 Minutes Intravenous Every 6 hours 11/30/20 1618 12/01/20 0329   11/30/20 1115  ceFAZolin (ANCEF) IVPB 2g/100 mL premix        2 g 200 mL/hr over 30 Minutes Intravenous To [facility]Stay [facility] 11/30/20 1059 11/30/20 1258          Subjective: Seen and examined.  She has no complaints  Objective: Vitals:   12/02/20 1600 12/02/20 1836 12/02/20 2050 12/03/20 0532  BP: (!) 111/96 129/63  (!) 112/58 (!) 125/55  Pulse: 83 99 93 80  Resp:  18 18 18   Temp: 98.5 F (36.9 C) (!) 97 F (36.1 C) 98.3 F (36.8 C) 98.2 F (36.8 C)  TempSrc: Oral  Oral Oral  SpO2: 100% 99% 97% 98%  Weight:      Height:        Intake/Output Summary (Last 24 hours) at 12/03/2020 0754 Last data filed at 12/03/2020 0400 Gross per 24 hour  Intake 636 ml  Output --  Net 636 ml    Filed Weights   11/29/20 1156 11/30/20 1140  Weight: 72.6 kg 72.6 kg    Examination:  General exam: Appears calm and comfortable  Respiratory system: Clear to auscultation. Respiratory effort normal. Cardiovascular system: S1 & S2 heard, RRR. No JVD, murmurs, rubs, gallops or clicks. No pedal edema. Gastrointestinal system: Abdomen is nondistended, soft and nontender. No organomegaly or masses felt. Normal bowel sounds heard. Central nervous system: Alert and oriented. No focal neurological deficits. Skin: No rashes, lesions or ulcers.  Psychiatry: Judgement and insight appear normal. Mood & affect appropriate.   Data Reviewed: I have personally reviewed following labs and imaging studies  CBC: Recent Labs  Lab 11/29/20 1313 11/30/20 0052 12/01/20 0245 12/02/20 0148 12/03/20 0106  WBC 18.3* 10.5 9.6 9.4 8.9  NEUTROABS 15.4*  --  6.9  --   --   HGB 12.8 11.1* 9.6* 8.0* 7.6*  HCT 39.1 33.1* 29.0* 24.0* 22.8*  MCV 98.0 95.1 96.3 96.4 96.6  PLT 320 258 199 193 215    Basic Metabolic Panel: Recent Labs  Lab 11/29/20 1313 11/30/20 0052 12/01/20 0245 12/02/20 0148  NA 141 140 137 137  K 4.0 4.5 4.3 3.7  CL 106 108 104 105  CO2 28 21* 24 25  GLUCOSE 145* 129* 133* 130*  BUN 23 23 22  26*  CREATININE 0.92 0.85 0.94 0.91  CALCIUM 9.3 8.9 8.6* 8.4*    GFR: Estimated Creatinine Clearance: 37.7 mL/min (by C-G formula based on SCr of 0.91 mg/dL). Liver Function Tests: Recent Labs  Lab 12/02/20 0148  AST 31  ALT 10  ALKPHOS 49  BILITOT 1.0  PROT 5.0*  ALBUMIN 2.6*    No results for input(s):  LIPASE, AMYLASE in the last 168 hours. No results for input(s): AMMONIA in the last 168 hours. Coagulation Profile: No results for input(s): INR, PROTIME in the last 168 hours. Cardiac Enzymes: No results for input(s): CKTOTAL, CKMB, CKMBINDEX, TROPONINI in the last 168 hours. BNP (last 3 results) No results for input(s): PROBNP in the last 8760 hours. HbA1C: No results for input(s): HGBA1C in the last 72 hours. CBG: No results for input(s): GLUCAP in the last 168 hours. Lipid Profile: No results for input(s): CHOL, HDL, LDLCALC, TRIG, CHOLHDL, LDLDIRECT in the last 72 hours. Thyroid Function Tests: No results for input(s): TSH, T4TOTAL, FREET4, T3FREE, THYROIDAB in the last 72 hours. Anemia Panel: No results for input(s):  VITAMINB12, FOLATE, FERRITIN, TIBC, IRON, RETICCTPCT in the last 72 hours. Sepsis Labs: No results for input(s): PROCALCITON, LATICACIDVEN in the last 168 hours.  Recent Results (from the past 240 hour(s))  SARS CORONAVIRUS 2 (TAT 6-24 HRS) Nasopharyngeal Nasopharyngeal Swab     Status: None   Collection Time: 11/29/20  6:17 PM   Specimen: Nasopharyngeal Swab  Result Value Ref Range Status   SARS Coronavirus 2 NEGATIVE NEGATIVE Final    Comment: (NOTE) SARS-CoV-2 target nucleic acids are NOT DETECTED.  The SARS-CoV-2 RNA is generally detectable in upper and lower respiratory specimens during the acute phase of infection. Negative results do not preclude SARS-CoV-2 infection, do not rule out co-infections with other pathogens, and should not be used as the sole basis for treatment or other patient management decisions. Negative results must be combined with clinical observations, patient history, and epidemiological information. The expected result is Negative.  Fact Sheet for Patients: HairSlick.no  Fact Sheet for Healthcare Providers: quierodirigir.com  This test is not yet approved or cleared by the  [place] FDA and  has been authorized for detection and/or diagnosis of SARS-CoV-2 by FDA under an Emergency Use Authorization (EUA). This EUA will remain  in effect (meaning this test can be used) for the duration of the COVID-19 declaration under Se ction 564(b)(1) of the Act, 21 U.S.C. section 360bbb-3(b)(1), unless the authorization is terminated or revoked sooner.  Performed at [facility], [address], [place], [place] [address]   Surgical pcr screen     Status: None   Collection Time: 11/30/20 10:15 AM   Specimen: Nasal Mucosa; Nasal Swab  Result Value Ref Range Status   MRSA, PCR NEGATIVE NEGATIVE Final   Staphylococcus aureus NEGATIVE NEGATIVE Final    Comment: (NOTE) The Xpert SA Assay (FDA approved for NASAL specimens in patients 22 years of age and older), is one component of a comprehensive surveillance program. It is not intended to diagnose infection nor to guide or monitor treatment. Performed at [facility], [address] [place], [place] [address]       Radiology Studies: No results found.  Scheduled Meds:  amLODipine  5 mg Oral Daily   vitamin C  1,000 mg Oral Daily   aspirin EC  81 mg Oral Daily   atorvastatin  40 mg Oral q1800   docusate sodium  100 mg Oral BID   enoxaparin (LOVENOX) injection  40 mg Subcutaneous Q24H   famotidine  20 mg Oral QHS   feeding supplement  237 mL Oral BID BM   levothyroxine  88 mcg Oral Daily   multivitamin with minerals  1 tablet Oral Daily   Continuous Infusions:  sodium chloride 35 mL/hr at 11/30/20 1809     LOS: 4 days   Time spent: 25 minutes   Hughie Closs, MD [facility]  12/03/2020, 7:54 AM   How to contact the [facility] Attending or Consulting provider 7A - 7P or covering provider during after hours 7P -7A, for this patient?  Check the care team in [facility] and look for a) attending/consulting TRH provider listed and b) the [facility] team listed. Page or secure chat 7A-7P. Log into  [email or web address] and use [place]'s universal password to access. If you do not have the password, please contact the hospital operator. Locate the [facility] provider you are looking for under [facility] and page to a number that you can be directly reached. If you still have difficulty reaching the provider, please  page the [facility] (Director on Call) for the Hospitalists listed on amion for assistance.

## 2020-12-03 NOTE — TOC Progression Note (Addendum)
Transition of Care [facility]) - Progression Note    Patient Details  Name: Analuisa Tudor MRN: [id number] Date of Birth: [birth year]  Transition of Care [facility]) CM/SW [place], [place] Phone Number: 12/03/2020, 4:18 PM  Clinical Narrative:    CSW met with pt at bedside to discuss possible SNF placement.  CSW gave pt a list from medicare.gov with SNF in the [place] area.  Pt will review list and update CSW on selected SNF.  TOC will continue to assist with disposition planning.   Expected Discharge Plan: [place] Barriers to Discharge: Continued Medical Work up, Unsafe home situation, Ship broker  Expected Discharge Plan and Services Expected Discharge Plan: [place] In-house Referral: Clinical Social Work     Living arrangements for the past 2 months: Single Family Home                                       Social Determinants of Health (SDOH) Interventions    Readmission Risk Interventions No flowsheet data found.

## 2020-12-04 LAB — CBC
HCT: 24.8 % — ABNORMAL LOW (ref 36.0–46.0)
Hemoglobin: 8 g/dL — ABNORMAL LOW (ref 12.0–15.0)
MCH: 31.6 pg (ref 26.0–34.0)
MCHC: 32.3 g/dL (ref 30.0–36.0)
MCV: 98 fL (ref 80.0–100.0)
Platelets: 286 10*3/uL (ref 150–400)
RBC: 2.53 MIL/uL — ABNORMAL LOW (ref 3.87–5.11)
RDW: 13.4 % (ref 11.5–15.5)
WBC: 8.2 10*3/uL (ref 4.0–10.5)
nRBC: 0 % (ref 0.0–0.2)

## 2020-12-04 LAB — PTH, INTACT AND CALCIUM
Calcium, Total (PTH): 8.4 mg/dL — ABNORMAL LOW (ref 8.7–10.3)
PTH: 30 pg/mL (ref 15–65)

## 2020-12-04 NOTE — TOC Progression Note (Signed)
Transition of Care [facility]) - Progression Note    Patient Details  Name: Andrea [name] MRN: [id number] Date of Birth: 1925-12-15  Transition of Care [facility]) CM/SW Contact  Ralene Bathe, LCSWA Phone Number: 12/04/2020, 9:55 AM  Clinical Narrative:    CSW spoke with the patient and inquired about choice and informed the patient that no facility has not accepted her as of yet.  The patient agreed for CSW to expand the search into [facility].    Expected Discharge Plan: Skilled Nursing Facility Barriers to Discharge: Continued Medical Work up, Unsafe home situation, English as a second language teacher  Expected Discharge Plan and Services Expected Discharge Plan: Skilled Nursing Facility In-house Referral: Clinical Social Work     Living arrangements for the past 2 months: Single Family Home                                       Social Determinants of Health (SDOH) Interventions    Readmission Risk Interventions No flowsheet data found.

## 2020-12-04 NOTE — Progress Notes (Signed)
PROGRESS NOTE    Andrea [name]  MMN:[id number] DOB: 25-Jun-1925 DOA: 11/29/2020 PCP: [facility]   Brief Narrative:  Andrea [name] is a 87 y.o. 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  She came to the ED due to mechanical fall at home.   Upon arrival to ED, hemodynamically stable, slightly elevated WBC at 18.3.  CT head and cervical spine did not show any acute findings.  X-ray of the right knee confirms a distal right femur fracture.  Orthopedics was consulted at [facility] and reported that they could not treat her at this facility.  Orthopedics at [facility] was consulted and agreed to care for patient and recommended transfer to [facility]  Assessment & Plan:   Principal Problem:   Femur fracture, right [facility]) Active Problems:   Hypothyroidism   Hypertension   Gait disturbance, post-stroke   Status post revision of total hip   Leukocytosis   History of stroke   Hyperlipidemia   GERD (gastroesophageal reflux disease)   Acute right distal femur fracture secondary to fall: S/p ORIF with orthopedics on 11/30/2020.  PT OT recommended SNF.  TOC consulted.  Pain controlled.  Management per orthopedics.  Acute postoperative blood loss anemia: Hemoglobin dropped from 12.8 preoperatively to 9.6 postoperatively and improved somewhat today to 8.0 today.  no indication of transfusion.  Monitor daily and transfuse if hemoglobin drops less than 7.   Hypothyroidism: Continue Synthroid.   Essential hypertension: Controlled.  Continue amlodipine.  History of stroke with chronic gait instability/hyperlipidemia: Resume aspirin and continue atorvastatin.  GERD: Continue Pepcid.  Placement: TOC working on placement.  DVT prophylaxis: enoxaparin (LOVENOX) injection  40 mg Start: 12/01/20 0800 SCDs Start: 11/30/20 1619   Code Status: Full Code  Family Communication: None present at bedside.  Plan of care discussed with patient in length and he verbalized understanding and agreed with it.  Status is: Inpatient  Remains inpatient appropriate because: Unsafe discharge plan/awaiting SNF placement  Dispo: The patient is from: Home              Anticipated d/c is to: SNF              Patient currently is medically stable to d/c.   Difficult to place patient No        Estimated body mass index is 27.46 kg/m as calculated from the following:   Height as of this encounter: 5\' 4"  (1.626 m).   Weight as of this encounter: 72.6 kg.    Nutritional Assessment: Body mass index is 27.46 kg/m. Seen by dietician.  I agree with the assessment and plan as outlined below: Nutrition Status: Nutrition Problem: Increased nutrient needs Etiology: post-op healing Signs/Symptoms: estimated needs Interventions: Ensure Enlive (each supplement provides 350kcal and 20 grams of protein), MVI  .  Skin Assessment: I have examined the patient's skin and I agree with the wound assessment as performed by the wound care RN as outlined below:    Consultants:  Orthopedics  Procedures:  ORIF  Antimicrobials:  Anti-infectives (From admission, onward)    Start     Dose/Rate Route Frequency Ordered Stop   11/30/20 1900  ceFAZolin (ANCEF) IVPB 2g/100 mL premix        2 g 200 mL/hr over 30 Minutes Intravenous Every 6 hours 11/30/20  1618 12/01/20 0329   11/30/20 1115  ceFAZolin (ANCEF) IVPB 2g/100 mL premix        2 g 200 mL/hr over 30 Minutes Intravenous To [facility]Stay [facility] 11/30/20 1059 11/30/20 1258          Subjective: Patient seen and examined.  She has no complaints.  Objective: Vitals:   12/03/20 0844 12/03/20 1513 12/03/20 1955 12/04/20 0727  BP: 117/63 (!) 124/57 111/60 (!) 121/53  Pulse: 92 84 80 84  Resp: 18 18 17 16   Temp: 97.9 F (36.6  C) (!) 97.5 F (36.4 C) 98.9 F (37.2 C) 98.7 F (37.1 C)  TempSrc:   Oral Oral  SpO2: 99% 98% 97% 96%  Weight:      Height:       No intake or output data in the 24 hours ending 12/04/20 1135  Filed Weights   11/29/20 1156 11/30/20 1140  Weight: 72.6 kg 72.6 kg    Examination:  General exam: Appears calm and comfortable  Respiratory system: Clear to auscultation. Respiratory effort normal. Cardiovascular system: S1 & S2 heard, RRR. No JVD, murmurs, rubs, gallops or clicks. No pedal edema. Gastrointestinal system: Abdomen is nondistended, soft and nontender. No organomegaly or masses felt. Normal bowel sounds heard. Central nervous system: Alert and oriented. No focal neurological deficits. Skin: No rashes, lesions or ulcers.  Psychiatry: Judgement and insight appear normal. Mood & affect appropriate.   Data Reviewed: I have personally reviewed following labs and imaging studies  CBC: Recent Labs  Lab 11/29/20 1313 11/30/20 0052 12/01/20 0245 12/02/20 0148 12/03/20 0106 12/04/20 0135  WBC 18.3* 10.5 9.6 9.4 8.9 8.2  NEUTROABS 15.4*  --  6.9  --   --   --   HGB 12.8 11.1* 9.6* 8.0* 7.6* 8.0*  HCT 39.1 33.1* 29.0* 24.0* 22.8* 24.8*  MCV 98.0 95.1 96.3 96.4 96.6 98.0  PLT 320 258 199 193 215 286    Basic Metabolic Panel: Recent Labs  Lab 11/29/20 1313 11/30/20 0052 12/01/20 0245 12/02/20 0148  NA 141 140 137 137  K 4.0 4.5 4.3 3.7  CL 106 108 104 105  CO2 28 21* 24 25  GLUCOSE 145* 129* 133* 130*  BUN 23 23 22  26*  CREATININE 0.92 0.85 0.94 0.91  CALCIUM 9.3 8.9 8.6* 8.4*  8.4*    GFR: Estimated Creatinine Clearance: 37.7 mL/min (by C-G formula based on SCr of 0.91 mg/dL). Liver Function Tests: Recent Labs  Lab 12/02/20 0148  AST 31  ALT 10  ALKPHOS 49  BILITOT 1.0  PROT 5.0*  ALBUMIN 2.6*    No results for input(s): LIPASE, AMYLASE in the last 168 hours. No results for input(s): AMMONIA in the last 168 hours. Coagulation Profile: No  results for input(s): INR, PROTIME in the last 168 hours. Cardiac Enzymes: No results for input(s): CKTOTAL, CKMB, CKMBINDEX, TROPONINI in the last 168 hours. BNP (last 3 results) No results for input(s): PROBNP in the last 8760 hours. HbA1C: No results for input(s): HGBA1C in the last 72 hours. CBG: No results for input(s): GLUCAP in the last 168 hours. Lipid Profile: No results for input(s): CHOL, HDL, LDLCALC, TRIG, CHOLHDL, LDLDIRECT in the last 72 hours. Thyroid Function Tests: No results for input(s): TSH, T4TOTAL, FREET4, T3FREE, THYROIDAB in the last 72 hours. Anemia Panel: No results for input(s): VITAMINB12, FOLATE, FERRITIN, TIBC, IRON, RETICCTPCT in the last 72 hours. Sepsis Labs: No results for input(s): PROCALCITON, LATICACIDVEN in the last 168 hours.  Recent Results (from the  past 240 hour(s))  SARS CORONAVIRUS 2 (TAT 6-24 HRS) Nasopharyngeal Nasopharyngeal Swab     Status: None   Collection Time: 11/29/20  6:17 PM   Specimen: Nasopharyngeal Swab  Result Value Ref Range Status   SARS Coronavirus 2 NEGATIVE NEGATIVE Final    Comment: (NOTE) SARS-CoV-2 target nucleic acids are NOT DETECTED.  The SARS-CoV-2 RNA is generally detectable in upper and lower respiratory specimens during the acute phase of infection. Negative results do not preclude SARS-CoV-2 infection, do not rule out co-infections with other pathogens, and should not be used as the sole basis for treatment or other patient management decisions. Negative results must be combined with clinical observations, patient history, and epidemiological information. The expected result is Negative.  Fact Sheet for Patients: HairSlick.no  Fact Sheet for Healthcare Providers: quierodirigir.com  This test is not yet approved or cleared by the [place] FDA and  has been authorized for detection and/or diagnosis of SARS-CoV-2 by FDA under an Emergency Use  Authorization (EUA). This EUA will remain  in effect (meaning this test can be used) for the duration of the COVID-19 declaration under Se ction 564(b)(1) of the Act, 21 U.S.C. section 360bbb-3(b)(1), unless the authorization is terminated or revoked sooner.  Performed at [facility], [address], [place], [place] [address]   Surgical pcr screen     Status: None   Collection Time: 11/30/20 10:15 AM   Specimen: Nasal Mucosa; Nasal Swab  Result Value Ref Range Status   MRSA, PCR NEGATIVE NEGATIVE Final   Staphylococcus aureus NEGATIVE NEGATIVE Final    Comment: (NOTE) The Xpert SA Assay (FDA approved for NASAL specimens in patients 54 years of age and older), is one component of a comprehensive surveillance program. It is not intended to diagnose infection nor to guide or monitor treatment. Performed at [facility], [address], [place], [place] [address]       Radiology Studies: No results found.  Scheduled Meds:  amLODipine  5 mg Oral Daily   vitamin C  1,000 mg Oral Daily   aspirin EC  81 mg Oral Daily   atorvastatin  40 mg Oral q1800   docusate sodium  100 mg Oral BID   enoxaparin (LOVENOX) injection  40 mg Subcutaneous Q24H   famotidine  20 mg Oral QHS   feeding supplement  237 mL Oral BID BM   levothyroxine  88 mcg Oral Daily   multivitamin with minerals  1 tablet Oral Daily   Continuous Infusions:  sodium chloride 35 mL/hr at 11/30/20 1809     LOS: 5 days   Time spent: 26 minutes   Hughie Closs, MD [facility]  12/04/2020, 11:35 AM   How to contact the [facility] Attending or Consulting provider 7A - 7P or covering provider during after hours 7P -7A, for this patient?  Check the care team in [facility] and look for a) attending/consulting TRH provider listed and b) the [facility] team listed. Page or secure chat 7A-7P. Log into [email or web address] and use [place]'s universal password to access. If you do not have the password, please contact the  hospital operator. Locate the [facility] provider you are looking for under [facility] and page to a number that you can be directly reached. If you still have difficulty reaching the provider, please page the [facility] (Director on Call) for the Hospitalists listed on amion for assistance.

## 2020-12-04 NOTE — Progress Notes (Signed)
Physical Therapy Treatment Patient Details Name: Andrea [name] MRN: [id number] DOB: [birth year] Today's Date: 12/04/2020    History of Present Illness 87yo female who presented on 11/29/20 after a fall at home, found to have R distal femur fracture. Received R femur ORIF on 11/30/20. PMH HTN, CVA, L hip fracture s/p ORIF    PT Comments    Patient received in bed, very pleasant but with limited recall of precautions, needed max cues and reminders for maintaining NWB R LE. Needed heavy maxAx2 for lateral scoot into the recliner while maintaining precautions with limited ability to boost hips while pressing down with BUEs. Left up in recliner with all needs met, chair alarm active. Continue to recommend SNF.    Follow Up Recommendations  SNF     Equipment Recommendations  Wheelchair (measurements PT);Wheelchair cushion (measurements PT);3in1 (PT) (drop arm WC and 3 in 1)    Recommendations for Other Services       Precautions / Restrictions Precautions Precautions: Fall Restrictions Weight Bearing Restrictions: Yes RLE Weight Bearing: Non weight bearing    Mobility  Bed Mobility Overal bed mobility: Needs Assistance Bed Mobility: Supine to Sit     Supine to sit: HOB elevated;Min assist     General bed mobility comments: HOB elevated, + rail; MinA for R LE management, otherwise able to scoot around with increased time and effort with max cues for technique/hand placement    Transfers Overall transfer level: Needs assistance   Transfers: Lateral/Scoot Transfers          Lateral/Scoot Transfers: Max assist;+2 physical assistance General transfer comment: continues to require maxAx2 to perform lateral scoots into recliner, and continues to require max intervention to maintain WB precautions even with scooting. Very little recall of appropriate technique for lateral scooting manuever  Ambulation/Gait             General Gait Details: unable   Stairs              Wheelchair Mobility    Modified Rankin (Stroke Patients Only)       Balance Overall balance assessment: History of Falls;Needs assistance Sitting-balance support: Bilateral upper extremity supported;Feet supported Sitting balance-Leahy Scale: Fair Sitting balance - Comments: dynamically                                    Cognition Arousal/Alertness: Awake/alert Behavior During Therapy: WFL for tasks assessed/performed Overall Cognitive Status: Within Functional Limits for tasks assessed                                 General Comments: HOH, definite reduced insight into precautions as well as impaired STM. needed multimodal cues for maintaining NWB R LE      Exercises      General Comments        Pertinent Vitals/Pain Pain Assessment: Faces Faces Pain Scale: Hurts a little bit Pain Location: R LE with mobility Pain Descriptors / Indicators: Aching;Sore;Discomfort Pain Intervention(s): Limited activity within patient's tolerance;Monitored during session    Home Living                      Prior Function            PT Goals (current goals can now be found in the care plan section) Acute Rehab PT Goals Patient Stated Goal: get more  mobile, feed cats PT Goal Formulation: With patient Time For Goal Achievement: 12/15/20 Potential to Achieve Goals: Fair Progress towards PT goals: Progressing toward goals (slowly)    Frequency    Min 3X/week      PT Plan Current plan remains appropriate    Co-evaluation              AM-PAC PT "6 Clicks" Mobility   Outcome Measure  Help needed turning from your back to your side while in a flat bed without using bedrails?: A Little Help needed moving from lying on your back to sitting on the side of a flat bed without using bedrails?: A Lot Help needed moving to and from a bed to a chair (including a wheelchair)?: Total Help needed standing up from a chair using your arms  (e.g., wheelchair or bedside chair)?: Total Help needed to walk in hospital room?: Total Help needed climbing 3-5 steps with a railing? : Total 6 Click Score: 9    End of Session Equipment Utilized During Treatment: Gait belt Activity Tolerance: Patient tolerated treatment well Patient left: in chair;with call bell/phone within reach;with chair alarm set Nurse Communication: Mobility status;Precautions PT Visit Diagnosis: Unsteadiness on feet (R26.81);History of falling (Z91.81);Muscle weakness (generalized) (M62.81);Other abnormalities of gait and mobility (R26.89);Difficulty in walking, not elsewhere classified (R26.2)     Time: 1572-6203 PT Time Calculation (min) (ACUTE ONLY): 20 min  Charges:  $Therapeutic Activity: 8-22 mins                    Windell Norfolk, DPT, PN2   Supplemental Physical Therapist [place]    Pager [phone] Acute Rehab Office [phone]

## 2020-12-05 ENCOUNTER — Other Ambulatory Visit: Payer: Self-pay

## 2020-12-05 ENCOUNTER — Encounter (HOSPITAL_COMMUNITY): Payer: Self-pay | Admitting: Physical Medicine and Rehabilitation

## 2020-12-05 ENCOUNTER — Inpatient Hospital Stay (HOSPITAL_COMMUNITY)
Admission: RE | Admit: 2020-12-05 | Discharge: 2020-12-28 | DRG: 560 | Disposition: A | Discharge status: Skilled Nursing Facility | Payer: Medicare Other | Source: Intra-hospital | Attending: Physical Medicine and Rehabilitation | Admitting: Physical Medicine and Rehabilitation

## 2020-12-05 DIAGNOSIS — Z79890 Hormone replacement therapy (postmenopausal): Secondary | ICD-10-CM

## 2020-12-05 DIAGNOSIS — Z6826 Body mass index (BMI) 26.0-26.9, adult: Secondary | ICD-10-CM

## 2020-12-05 DIAGNOSIS — Z90710 Acquired absence of both cervix and uterus: Secondary | ICD-10-CM | POA: Diagnosis not present

## 2020-12-05 DIAGNOSIS — L89152 Pressure ulcer of sacral region, stage 2: Secondary | ICD-10-CM | POA: Diagnosis present

## 2020-12-05 DIAGNOSIS — E785 Hyperlipidemia, unspecified: Secondary | ICD-10-CM | POA: Diagnosis present

## 2020-12-05 DIAGNOSIS — L8990 Pressure ulcer of unspecified site, unspecified stage: Secondary | ICD-10-CM | POA: Insufficient documentation

## 2020-12-05 DIAGNOSIS — R2689 Other abnormalities of gait and mobility: Secondary | ICD-10-CM | POA: Diagnosis present

## 2020-12-05 DIAGNOSIS — K5901 Slow transit constipation: Secondary | ICD-10-CM | POA: Diagnosis not present

## 2020-12-05 DIAGNOSIS — Z7982 Long term (current) use of aspirin: Secondary | ICD-10-CM | POA: Diagnosis not present

## 2020-12-05 DIAGNOSIS — S728X1A Other fracture of right femur, initial encounter for closed fracture: Secondary | ICD-10-CM | POA: Diagnosis not present

## 2020-12-05 DIAGNOSIS — Z8249 Family history of ischemic heart disease and other diseases of the circulatory system: Secondary | ICD-10-CM

## 2020-12-05 DIAGNOSIS — S728X1D Other fracture of right femur, subsequent encounter for closed fracture with routine healing: Secondary | ICD-10-CM | POA: Diagnosis not present

## 2020-12-05 DIAGNOSIS — Z713 Dietary counseling and surveillance: Secondary | ICD-10-CM

## 2020-12-05 DIAGNOSIS — I959 Hypotension, unspecified: Secondary | ICD-10-CM | POA: Diagnosis present

## 2020-12-05 DIAGNOSIS — G4700 Insomnia, unspecified: Secondary | ICD-10-CM | POA: Diagnosis present

## 2020-12-05 DIAGNOSIS — S72491D Other fracture of lower end of right femur, subsequent encounter for closed fracture with routine healing: Secondary | ICD-10-CM | POA: Diagnosis present

## 2020-12-05 DIAGNOSIS — G8918 Other acute postprocedural pain: Secondary | ICD-10-CM | POA: Diagnosis present

## 2020-12-05 DIAGNOSIS — R32 Unspecified urinary incontinence: Secondary | ICD-10-CM | POA: Diagnosis not present

## 2020-12-05 DIAGNOSIS — L89322 Pressure ulcer of left buttock, stage 2: Secondary | ICD-10-CM | POA: Diagnosis present

## 2020-12-05 DIAGNOSIS — Z79899 Other long term (current) drug therapy: Secondary | ICD-10-CM | POA: Diagnosis not present

## 2020-12-05 DIAGNOSIS — S72001A Fracture of unspecified part of neck of right femur, initial encounter for closed fracture: Secondary | ICD-10-CM

## 2020-12-05 DIAGNOSIS — I69398 Other sequelae of cerebral infarction: Secondary | ICD-10-CM | POA: Diagnosis not present

## 2020-12-05 DIAGNOSIS — K5900 Constipation, unspecified: Secondary | ICD-10-CM | POA: Diagnosis present

## 2020-12-05 DIAGNOSIS — E039 Hypothyroidism, unspecified: Secondary | ICD-10-CM | POA: Diagnosis present

## 2020-12-05 DIAGNOSIS — E46 Unspecified protein-calorie malnutrition: Secondary | ICD-10-CM | POA: Diagnosis present

## 2020-12-05 DIAGNOSIS — Z20822 Contact with and (suspected) exposure to covid-19: Secondary | ICD-10-CM | POA: Diagnosis present

## 2020-12-05 DIAGNOSIS — S72401S Unspecified fracture of lower end of right femur, sequela: Secondary | ICD-10-CM

## 2020-12-05 DIAGNOSIS — I10 Essential (primary) hypertension: Secondary | ICD-10-CM | POA: Diagnosis present

## 2020-12-05 DIAGNOSIS — D62 Acute posthemorrhagic anemia: Secondary | ICD-10-CM | POA: Diagnosis present

## 2020-12-05 DIAGNOSIS — E663 Overweight: Secondary | ICD-10-CM | POA: Diagnosis present

## 2020-12-05 DIAGNOSIS — M7989 Other specified soft tissue disorders: Secondary | ICD-10-CM | POA: Diagnosis not present

## 2020-12-05 DIAGNOSIS — E8809 Other disorders of plasma-protein metabolism, not elsewhere classified: Secondary | ICD-10-CM

## 2020-12-05 DIAGNOSIS — S7290XA Unspecified fracture of unspecified femur, initial encounter for closed fracture: Secondary | ICD-10-CM | POA: Diagnosis present

## 2020-12-05 DIAGNOSIS — W1830XD Fall on same level, unspecified, subsequent encounter: Secondary | ICD-10-CM | POA: Diagnosis not present

## 2020-12-05 MED ORDER — ONDANSETRON HCL 4 MG PO TABS
4.0000 mg | ORAL_TABLET | Freq: Four times a day (QID) | ORAL | Status: DC | PRN
  Administered 2020-12-26: 4 mg via ORAL
  Filled 2020-12-05 (×2): qty 1

## 2020-12-05 MED ORDER — ENOXAPARIN SODIUM 40 MG/0.4ML IJ SOSY: 40.0000 mg | PREFILLED_SYRINGE | INTRAMUSCULAR | Status: DC

## 2020-12-05 MED ORDER — LEVOTHYROXINE SODIUM 88 MCG PO TABS
88.0000 ug | ORAL_TABLET | Freq: Every day | ORAL | Status: DC
  Administered 2020-12-06 – 2020-12-28 (×23): 88 ug via ORAL
  Filled 2020-12-05 (×23): qty 1

## 2020-12-05 MED ORDER — ASPIRIN EC 81 MG PO TBEC
81.0000 mg | DELAYED_RELEASE_TABLET | Freq: Every day | ORAL | Status: DC
  Administered 2020-12-06 – 2020-12-28 (×23): 81 mg via ORAL
  Filled 2020-12-05 (×23): qty 1

## 2020-12-05 MED ORDER — AMLODIPINE BESYLATE 5 MG PO TABS
5.0000 mg | ORAL_TABLET | Freq: Every day | ORAL | Status: DC
  Administered 2020-12-06: 5 mg via ORAL
  Filled 2020-12-05: qty 1

## 2020-12-05 MED ORDER — HYDROCODONE-ACETAMINOPHEN 5-325 MG PO TABS
1.0000 | ORAL_TABLET | Freq: Four times a day (QID) | ORAL | Status: DC | PRN
  Administered 2020-12-06 – 2020-12-14 (×12): 1 via ORAL
  Filled 2020-12-05 (×12): qty 1

## 2020-12-05 MED ORDER — ACETAMINOPHEN 325 MG PO TABS
325.0000 mg | ORAL_TABLET | Freq: Four times a day (QID) | ORAL | Status: DC | PRN
  Administered 2020-12-08 – 2020-12-13 (×4): 650 mg via ORAL
  Filled 2020-12-05 (×5): qty 2

## 2020-12-05 MED ORDER — ONDANSETRON HCL 4 MG/2ML IJ SOLN: 4.0000 mg | Freq: Four times a day (QID) | INTRAMUSCULAR | Status: DC | PRN

## 2020-12-05 MED ORDER — DOCUSATE SODIUM 100 MG PO CAPS
100.0000 mg | ORAL_CAPSULE | Freq: Two times a day (BID) | ORAL | Status: DC
  Administered 2020-12-05 – 2020-12-28 (×46): 100 mg via ORAL
  Filled 2020-12-05 (×46): qty 1

## 2020-12-05 MED ORDER — FAMOTIDINE 20 MG PO TABS
20.0000 mg | ORAL_TABLET | Freq: Every day | ORAL | Status: DC
  Administered 2020-12-05 – 2020-12-27 (×23): 20 mg via ORAL
  Filled 2020-12-05 (×23): qty 1

## 2020-12-05 MED ORDER — ADULT MULTIVITAMIN W/MINERALS CH
1.0000 | ORAL_TABLET | Freq: Every day | ORAL | Status: DC
  Administered 2020-12-06 – 2020-12-28 (×23): 1 via ORAL
  Filled 2020-12-05 (×23): qty 1

## 2020-12-05 MED ORDER — ENOXAPARIN SODIUM 40 MG/0.4ML IJ SOSY
40.0000 mg | PREFILLED_SYRINGE | INTRAMUSCULAR | Status: DC
  Administered 2020-12-06 – 2020-12-28 (×23): 40 mg via SUBCUTANEOUS
  Filled 2020-12-05 (×23): qty 0.4

## 2020-12-05 MED ORDER — ATORVASTATIN CALCIUM 40 MG PO TABS
40.0000 mg | ORAL_TABLET | Freq: Every day | ORAL | Status: DC
  Administered 2020-12-05 – 2020-12-27 (×23): 40 mg via ORAL
  Filled 2020-12-05 (×23): qty 1

## 2020-12-05 MED ORDER — BISACODYL 5 MG PO TBEC
5.0000 mg | DELAYED_RELEASE_TABLET | Freq: Every day | ORAL | Status: DC | PRN
  Administered 2020-12-16 – 2020-12-26 (×4): 5 mg via ORAL
  Filled 2020-12-05 (×4): qty 1

## 2020-12-05 MED ORDER — ASCORBIC ACID 500 MG PO TABS
1000.0000 mg | ORAL_TABLET | Freq: Every day | ORAL | Status: DC
  Administered 2020-12-06 – 2020-12-28 (×23): 1000 mg via ORAL
  Filled 2020-12-05 (×23): qty 2

## 2020-12-05 NOTE — Progress Notes (Signed)
Inpatient Rehab Admissions Coordinator:   Patient was screened for CIR candidacy by Megan Salon, MS, CCC-SLP.  At this time, Pt. Appears to demonstrate medical necessity, functional decline, and ability to tolerate intensity of CIR. Pt. is a potential candidate for CIR. I will place  order for rehab consult per protocol for full assessment candidacy. Please contact me any with questions.  Megan Salon, MS, CCC-SLP Rehab Admissions Coordinator  [phone] (celll) [phone] (office)

## 2020-12-05 NOTE — Progress Notes (Signed)
Inpatient Rehabilitation Medication Review by a Pharmacist  A complete drug regimen review was completed for this patient to identify any potential clinically significant medication issues.  High Risk Drug Classes Is patient taking? Indication by Medication  Antipsychotic No   Anticoagulant No   Antibiotic No   Opioid No Hydrocodone/acetaminophen for moderate pain  Antiplatelet Yes Aspirin- secondary stroke prevention  Hypoglycemics/insulin No   Vasoactive Medication Yes Amlodipine - hypertension  Chemotherapy No   Other No      Type of Medication Issue Identified Description of Issue Recommendation(s)  Drug Interaction(s) (clinically significant)     Duplicate Therapy  Atorvastatin 20 mg and 40 mg listed on PTA med list  Continue atorvastatin 40 mg only as ordered while inpatient.  Allergy     No Medication Administration End Date     Incorrect Dose     Additional Drug Therapy Needed     Significant med changes from prior encounter (inform family/care partners about these prior to discharge).    Other  Solifenacin 5 mg daily listed on PTA med list - but patient reported as not taking every day Vitamin D on PTA med list F/u need for resumption of solifenacin.  Vitamin D level elevated 9/2- supplement stopped appropriately    Clinically significant medication issues were identified that warrant physician communication and completion of prescribed/recommended actions by midnight of the next day:  No  Name of provider notified for urgent issues identified: n/a  Provider Method of Notification: n/a  Pharmacist comments:   Time spent performing this drug regimen review (minutes):  15   Reece Leader, Colon Flattery, [facility] Clinical Pharmacist  12/05/2020 5:00 PM   [facility] pharmacy phone numbers are listed on [email or web address]

## 2020-12-05 NOTE — Progress Notes (Signed)
Report called to 5c and pt was transferred to the room via the SWAT team

## 2020-12-05 NOTE — Progress Notes (Signed)
INPATIENT REHABILITATION ADMISSION NOTE   Arrival Method: Recliner     Mental Orientation: A&Ox4   Assessment: done   Skin: done   IV'S: R. Forearm   Pain: left leg pain   Tubes and Drains: none   Safety Measures: reviewed   Vital Signs: done   Height and Weight: done   Rehab Orientation: done   Family: pt to notify     Notes: done  Marylu Lund, RN

## 2020-12-05 NOTE — Progress Notes (Signed)
Nutrition Follow-up  DOCUMENTATION CODES:   Not applicable  INTERVENTION:   -D/c Ensure Enlive po BID, each supplement provides 350 kcal and 20 grams of protein  -Continue MVI with minerals daily -Magic cup BID with meals, each supplement provides 290 kcal and 9 grams of protein   NUTRITION DIAGNOSIS:   Increased nutrient needs related to post-op healing as evidenced by estimated needs.  Ongoing  GOAL:   Patient will meet greater than or equal to 90% of their needs  Progressing   MONITOR:   PO intake, Supplement acceptance, Diet advancement, Labs, Weight trends, Skin, I & O's  REASON FOR ASSESSMENT:   Consult Assessment of nutrition requirement/status, Hip fracture protocol  ASSESSMENT:   Andrea [name] is a 87 y.o. 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  She reports that she was feeding the cats this morning on her front porch when she developed a fall.  She reports that she lost balance and footing on the right leg.  She is adamant that she did not trip for over any of the cats but she does not seem to be 100% sure.  She has been having severe pain involving the right distal femur and inability to ambulate since falling.  She denies having chest pain or shortness of breath.  She denies passing out.  She denies having vision changes.  She reports that she does have friends and acquaintances that live nearby that are able to check on her from time to time and assist her when needed.  9/3- s/p Procedure(s): OPEN REDUCTION INTERNAL FIXATION (ORIF) DISTAL FEMUR FRACTURE (Right) with INTRACONDYLAR EXTENSION  Reviewed I/O's: +120 ml x 24 hours and +1.9 L since admission  Pt unavailable at time of visit.  Pt with good appetite. Noted  meal completion 50-100%. Pt is refusing Ensure supplements.   Per TOC notes, plan for SNF placement once medically stable.   Medications reviewed and include colace.   Labs reviewed.   Diet Order:   Diet Order             Diet regular Room service appropriate? Yes with Assist; Fluid consistency: Thin  Diet effective now                   EDUCATION NEEDS:   No education needs have been identified at this time  Skin:  Skin Assessment: Skin Integrity Issues: Skin Integrity Issues:: Incisions Incisions: closed rt leg  Last BM:  12/04/20  Height:   Ht Readings from Last 1 Encounters:  11/30/20 5\' 4"  (1.626 m)    Weight:   Wt Readings from Last 1 Encounters:  11/30/20 72.6 kg    Ideal Body Weight:  54.5 kg  BMI:  Body mass index is 27.46 kg/m.  Estimated Nutritional Needs:   Kcal:  1600-1800  Protein:  85-100 grams  Fluid:  > 1.6 L    01/30/21, RD, LDN, CDCES Registered Dietitian II Certified Diabetes Care and Education Specialist Please refer to [facility] for RD and/or RD on-call/weekend/after hours pager

## 2020-12-05 NOTE — PMR Pre-admission (Signed)
PMR Admission Coordinator Pre-Admission Assessment  Patient: Andrea [name] is an 87 y.o., female MRN: [id number] DOB: [birth year] Height: _0  (162.6 cm) Weight: 72.6 kg  Insurance Information HMO:     PPO:      PCP:      IPA:      80/20: OTHER:  PRIMARY: Medicare Part A and B       Policy#: Subscriber: pt Phone#: Verified online    Fax#:  Pre-Cert#:       Employer:  Benefits:  Phone #:      Name:  Eff. Date: Parts A ad B effective 09/29/1992  Deduct: $1556      Out of Pocket Max:  None      Life Max: N/A  CIR: 100%      SNF: 100 days Outpatient: 80%     Co-Pay: 20% Home Health: 100%      Co-Pay: none DME: 80%     Co-Pay: 20% Providers: patient's choice SECONDARY: Medicaid       Policy#: [id number]     Phone#:   Financial Counselor:       Phone#:   The "Data Collection Information Summary" for patients in Inpatient Rehabilitation Facilities with attached "Privacy Act [place] Records" was provided and verbally reviewed with: Patient  Emergency Contact Information Contact Information     Name Relation Home Work Mobile   [place] [phone]         Current Medical History  Patient Admitting Diagnosis: R distal femur fracture  History of Present Illness:  Andrea [name] is a 87 year old right-handed female with 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  Per chart review lives alone.  Mobile home ramped entrance.  Independent with assistive device.  Presented 11/29/2020 after mechanical fall while feeding her cats without loss of consciousness.  Cranial CT scan CT cervical spine negative.  Sustained right distal femur fracture with intercondylar extension.  Underwent ORIF 12/02/2020 per Dr. Marcelino Scot.  Nonweightbearing right lower extremity.  Acute blood loss anemia 8.0 and monitored.  Placed on Lovenox for DVT prophylaxis.  Therapy evaluations completed  due to patient decreased functional mobility was admitted for a comprehensive rehab program.    Patient's medical [facility] has been reviewed by the rehabilitation admission coordinator and physician.  Past Medical History  Past Medical History:  Diagnosis Date   Hypertension    Thyroid disease     Has the patient had major surgery during 100 days prior to admission? Yes  Family History   family history includes CAD in her father; CAD (age of onset: 87) in her mother; Heart failure in her father.  Current Medications  Current Facility-Administered Medications:    0.9 %  sodium chloride infusion, , Intravenous, Continuous, Ainsley Spinner, PA-C, Last Rate: 35 mL/hr at 11/30/20 1809, New Bag at 11/30/20 1809   acetaminophen (TYLENOL) tablet 325-650 mg, 325-650 mg, Oral, Q6H PRN, Ainsley Spinner, PA-C   amLODipine (NORVASC) tablet 5 mg, 5 mg, Oral, Daily, Ainsley Spinner, PA-C, 5 mg at 12/05/20 3428   ascorbic acid (VITAMIN C) tablet 1,000 mg, 1,000 mg, Oral, Daily, Ainsley Spinner, PA-C, 1,000 mg at 12/05/20 7681   aspirin EC tablet 81 mg, 81 mg, Oral, Daily, Ainsley Spinner, PA-C, 81 mg at 12/05/20 1572   atorvastatin (LIPITOR) tablet 40 mg, 40 mg, Oral, q1800, Darliss Cheney, MD, 40 mg at 12/04/20 1732   bisacodyl (DULCOLAX) EC  tablet 5 mg, 5 mg, Oral, Daily PRN, Ainsley Spinner, PA-C, 5 mg at 12/02/20 1639   docusate sodium (COLACE) capsule 100 mg, 100 mg, Oral, BID, Ainsley Spinner, PA-C, 100 mg at 12/05/20 0852   enoxaparin (LOVENOX) injection 40 mg, 40 mg, Subcutaneous, Q24H, Ainsley Spinner, PA-C, 40 mg at 12/05/20 0853   famotidine (PEPCID) tablet 20 mg, 20 mg, Oral, QHS, Ainsley Spinner, PA-C, 20 mg at 12/04/20 2135   HYDROcodone-acetaminophen (NORCO/VICODIN) 5-325 MG per tablet 1-2 tablet, 1-2 tablet, Oral, Q6H PRN, Ainsley Spinner, PA-C, 1 tablet at 12/04/20 1509   levothyroxine (SYNTHROID) tablet 88 mcg, 88 mcg, Oral, Daily, Ainsley Spinner, PA-C, 88 mcg at 12/05/20 0615    menthol-cetylpyridinium (CEPACOL) lozenge 3 mg, 1 lozenge, Oral, PRN **OR** phenol (CHLORASEPTIC) mouth spray 1 spray, 1 spray, Mouth/Throat, PRN, Ainsley Spinner, PA-C   metoCLOPramide (REGLAN) tablet 5-10 mg, 5-10 mg, Oral, Q8H PRN **OR** metoCLOPramide (REGLAN) injection 5-10 mg, 5-10 mg, Intravenous, Q8H PRN, Ainsley Spinner, PA-C   morphine 2 MG/ML injection 0.5 mg, 0.5 mg, Intravenous, Q2H PRN, Ainsley Spinner, PA-C, 0.5 mg at 12/03/20 0848   multivitamin with minerals tablet 1 tablet, 1 tablet, Oral, Daily, Ainsley Spinner, PA-C, 1 tablet at 12/05/20 0852   ondansetron (ZOFRAN) tablet 4 mg, 4 mg, Oral, Q6H PRN, 4 mg at 11/30/20 2103 **OR** ondansetron (ZOFRAN) injection 4 mg, 4 mg, Intravenous, Q6H PRN, Ainsley Spinner, PA-C  Patients Current Diet:  Diet Order             Diet regular Room service appropriate? Yes with Assist; Fluid consistency: Thin  Diet effective now                   Precautions / Restrictions Precautions Precautions: Fall Restrictions Weight Bearing Restrictions: Yes RLE Weight Bearing: Non weight bearing   Has the patient had 2 or more falls or a fall with injury in the past year? Yes  Prior Activity Level Community (5-7x/wk): Pt. was active in the community PTA  Prior Functional Level Self Care: Did the patient need help bathing, dressing, using the toilet or eating? Independent  Indoor Mobility: Did the patient need assistance with walking from room to room (with or without device)? Independent  Stairs: Did the patient need assistance with internal or external stairs (with or without device)? Independent  Functional Cognition: Did the patient need help planning regular tasks such as shopping or remembering to take medications? Independent  Patient Information Are you of Hispanic, Latino/a,or Spanish origin?: A. No, not of Hispanic, Latino/a, or Spanish origin What is your race?: A. White Do you need or want an interpreter to communicate with a doctor or health  care staff?: 0. No  Patient's Response To:  Health Literacy and Transportation Is the patient able to respond to health literacy and transportation needs?: Yes Health Literacy - How often do you need to have someone help you when you read instructions, pamphlets, or other written material from your doctor or pharmacy?: Sometimes In the past 12 months, has lack of transportation kept you from medical appointments or from getting medications?: No In the past 12 months, has lack of transportation kept you from meetings, work, or from getting things needed for daily living?: No  Home Assistive Devices / [place] Devices/Equipment: Environmental consultant (specify type) Home Equipment: Grab bars - tub/shower, Bedside commode, Walker - 2 wheels, Cane - single point  Prior Device Use: Indicate devices/aids used by the patient prior to current illness, exacerbation or injury? Gilford Rile  Current Functional Level Cognition  Overall Cognitive Status: Impaired/Different from baseline Orientation Level: Oriented X4 General Comments: Noting pt with increased confusion this session. This patient is known to this therapist from eval. Pt with decreased orientation and processing. Able to reorient with appropiate questions and cues; pt reports she was napping and tired. as session progressed, pt more arousal and processing more quickly. Typically, this patient is very aware. Despite confusion, pt continues to be very motivated and agreeable to education/cues.    Extremity Assessment (includes Sensation/Coordination)  Upper Extremity Assessment: Generalized weakness  Lower Extremity Assessment: Defer to PT evaluation    ADLs  Overall ADL's : Needs assistance/impaired Eating/Feeding: Set up, Sitting Grooming: Set up, Sitting Upper Body Bathing: Minimal assistance, Sitting Lower Body Bathing: Maximal assistance, Sit to/from stand, +2 for physical assistance Upper Body Dressing : Minimal assistance,  Sitting Lower Body Dressing: Maximal assistance, +2 for physical assistance, Sit to/from stand Toilet Transfer: Moderate assistance, +2 for physical assistance, +2 for safety/equipment, Transfer board, Requires drop arm Toilet Transfer Details (indicate cue type and reason): Mod A for lateral scoot to L. Pt requiring Max cues for weight shift and sequencing. With education, pt able to participate more in lateral scoot Functional mobility during ADLs: Moderate assistance, +2 for physical assistance General ADL Comments: Continues to practice lateral scoot to drop arm reclienr    Mobility  Overal bed mobility: Needs Assistance Bed Mobility: Supine to Sit Supine to sit: HOB elevated, Min assist General bed mobility comments: Min A for managing RLE    Transfers  Overall transfer level: Needs assistance Equipment used: Rolling walker (2 wheeled) Transfers: Lateral/Scoot Transfers Sit to Stand: Mod assist, From elevated surface (ModAx3)  Lateral/Scoot Transfers: Mod assist, +2 physical assistance General transfer comment: Assist to faciltiate scooting of hips and to maintain wb status.    Ambulation / Gait / Stairs / Wheelchair Mobility  Ambulation/Gait General Gait Details: unable    Posture / Balance Dynamic Sitting Balance Sitting balance - Comments: dynamically Balance Overall balance assessment: History of Falls, Needs assistance Sitting-balance support: Bilateral upper extremity supported, Feet supported Sitting balance-Leahy Scale: Good Sitting balance - Comments: dynamically Standing balance support: Bilateral upper extremity supported, During functional activity Standing balance-Leahy Scale: Poor Standing balance comment: reliant on BUE support and external assist    Special needs/care consideration Skin surgical incision, ecchymosis to the arm and head, hematoma on forehead   Previous Home Environment (from acute therapy documentation) Living Arrangements: Alone  Lives  With: Alone Available Help at Discharge: Friend(s), Available PRN/intermittently Type of Home: Mobile home Home Layout: One level Home Access: Ramped entrance Bathroom Shower/Tub: Chiropodist: Standard Bathroom Accessibility: Yes Home Care Services: No Additional Comments: drives; has 30 outdoor cats she takes care of  Discharge Living Setting Plans for Discharge Living Setting: Alone Type of Home at Discharge: Mobile home Discharge Home Layout: One level Discharge Home Access: [place] entrance Discharge Bathroom Shower/Tub: Tub/shower unit Discharge Bathroom Toilet: Standard Discharge Bathroom Accessibility: Yes How Accessible: Accessible via walker Does the patient have any problems obtaining your medications?: No  Social/Family/Support Systems Patient Roles: Other (Comment) Contact Information: [phone] Anticipated Caregiver: Morgyn Marut (neice) Anticipated Caregiver's Contact Information: [phone] Ability/Limitations of Caregiver: Pt. has friends to assist, wants neice to be called only after decisions made. Pt. Is going to look for friend Theresa's number because she states she will be the one staying with her.  Caregiver Availability: Intermittent Discharge Plan Discussed with Primary Caregiver: Yes Is Caregiver In Agreement  with Plan?: Yes Does Caregiver/Family have Issues with Lodging/Transportation while Pt is in Rehab?: No  Goals Patient/Family Goal for Rehab: PT/OT min A Expected length of stay: 18-21 days Pt/Family Agrees to Admission and willing to participate: Yes Program Orientation Provided & Reviewed with Pt/Caregiver Including Roles  & Responsibilities: Yes  Decrease burden of Care through IP rehab admission: Specialzed equipment needs, Decrease number of caregivers, Bowel and bladder program, and Patient/family education  Possible need for SNF placement upon discharge: not anticipated  Patient Condition: I have reviewed  medical records from  [facility] , spoken with CM, and patient, daughter, and family member. I met with patient at the bedside for inpatient rehabilitation assessment.  Patient will benefit from ongoing PT, OT, and SLP, can actively participate in 3 hours of therapy a day 5 days of the week, and can make measurable gains during the admission.  Patient will also benefit from the coordinated team approach during an Inpatient Acute Rehabilitation admission.  The patient will receive intensive therapy as well as Rehabilitation physician, nursing, social worker, and care management interventions.  Due to safety, skin/wound care, disease management, medication administration, pain management, and patient education the patient requires 24 hour a day rehabilitation nursing.  The patient is currently Mod-max+2  with mobility and basic ADLs.  Discharge setting and therapy post discharge at home with home health is anticipated.  Patient has agreed to participate in the Acute Inpatient Rehabilitation Program and will admit today.  Preadmission Screen Completed By:  Genella Mech, 12/05/2020 1:50 PM ______________________________________________________________________   Discussed status with Dr. Naaman Plummer on 12/05/20 at 89 and received approval for admission today.  Admission Coordinator:  Genella Mech, CCC-SLP, time 1400/Date 12/05/2020   Assessment/Plan: Diagnosis: right distal femur fracture after fall Does the need for close, 24 hr/day Medical supervision in concert with the patient's rehab needs make it unreasonable for this patient to be served in a less intensive setting? Yes Co-Morbidities requiring supervision/potential complications: right thalamic infarc with residual balance deficits, ABLA, htn, pain mgt Due to bladder management, bowel management, safety, skin/wound care, disease management, medication administration, pain management, and patient education, does the patient require 24  hr/day rehab nursing? Yes Does the patient require coordinated care of a physician, rehab nurse, PT, OT to address physical and functional deficits in the context of the above medical diagnosis(es)? Yes Addressing deficits in the following areas: balance, endurance, locomotion, strength, transferring, bowel/bladder control, bathing, dressing, feeding, grooming, toileting, and swallowing Can the patient actively participate in an intensive therapy program of at least 3 hrs of therapy 5 days a week? Yes The potential for patient to make measurable gains while on inpatient rehab is excellent Anticipated functional outcomes upon discharge from inpatient rehab: supervision and min assist PT, supervision and min assist OT, n/a SLP Estimated rehab length of stay to reach the above functional goals is: 17-21 days Anticipated discharge destination: Home 10. Overall Rehab/Functional Prognosis: excellent   MD Signature: Meredith Staggers, MD, [facility] 12/05/2020

## 2020-12-05 NOTE — H&P (Signed)
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  Per chart review lives alone.  Mobile home ramped entrance.  Independent with assistive device.  Presented 11/29/2020 after mechanical fall while feeding her cats without loss of consciousness.  Cranial CT scan CT cervical spine negative.  Sustained right distal femur fracture with intercondylar extension.  Underwent ORIF 12/02/2020 per Dr. Carola Frost.  Nonweightbearing right lower extremity.  Acute blood loss anemia 8.0 and monitored.  Placed on Lovenox for DVT prophylaxis.  Therapy evaluations completed due to patient decreased functional mobility was admitted for a comprehensive rehab program.   Review of Systems  Constitutional:  Negative for chills and fever.  HENT:  Negative for hearing loss.   Eyes:  Negative for blurred vision and double vision.  Respiratory:  Negative for cough and shortness of breath.   Cardiovascular:  Negative for chest pain, palpitations and leg swelling.  Gastrointestinal:  Positive for constipation. Negative for heartburn, nausea and vomiting.  Genitourinary:  Negative for dysuria, flank pain and hematuria.  Musculoskeletal:  Positive for joint pain and myalgias.  Skin:  Negative for rash.  All other systems reviewed and are negative.     Past Medical History:  Diagnosis Date   Hypertension     Thyroid disease           Past Surgical History:  Procedure Laterality Date   ABDOMINAL HYSTERECTOMY       ORIF FEMUR FRACTURE Right 11/30/2020    Procedure: OPEN REDUCTION INTERNAL FIXATION (ORIF) DISTAL FEMUR FRACTURE;  Surgeon: Myrene Galas, MD;  Location: [facility];  Service: Orthopedics;  Laterality: Right;   ORIF HIP FRACTURE Left 09/02/2014     Procedure: OPEN REDUCTION INTERNAL FIXATION LEFT HIP FRACTURE;  Surgeon: Darreld Mclean, MD;  Location: [facility];  Service: Orthopedics;  Laterality: Left;         Family History  Problem Relation Age of Onset   CAD Mother 71        Died of MI   Heart failure Father     CAD Father      Social History:  reports that she has never smoked. She has never used smokeless tobacco. She reports that she does not drink alcohol and does not use drugs. Allergies: No Known Allergies       Medications Prior to Admission  Medication Sig Dispense Refill   acetaminophen (TYLENOL) 325 MG tablet Take 2 tablets (650 mg total) by mouth every 4 (four) hours as needed for mild pain (or temp > 37.5 C (99.5 F)). 20 tablet 0   amLODipine (NORVASC) 5 MG tablet Take 5 mg by mouth daily.       ASPIRIN ADULT LOW STRENGTH 81 MG EC tablet Take 81 mg by mouth daily.       atorvastatin (LIPITOR) 20 MG tablet Take 20 mg by mouth daily.       levothyroxine (SYNTHROID) 88 MCG tablet Take 88 mcg by mouth daily.       solifenacin (VESICARE) 5 MG tablet Take 5 mg by mouth daily.       Vitamin D, Ergocalciferol, (DRISDOL) 1.25 MG (50000 UNIT) CAPS capsule Take 50,000 Units by mouth once a week.       atorvastatin (LIPITOR)  40 MG tablet Take 1 tablet (40 mg total) daily at 6 PM by mouth. (Patient not taking: Reported on 11/29/2020) 30 tablet 0   famotidine (PEPCID) 20 MG tablet Take 1 tablet (20 mg total) 2 (two) times daily by mouth. (Patient not taking: No sig reported) 60 tablet 0      Drug Regimen Review Drug regimen was reviewed and remains appropriate with no significant issues identified   Home: Home Living Family/patient expects to be discharged to:: Private residence Living Arrangements: Alone Available Help at Discharge: Friend(s), Available PRN/intermittently Type of Home: Mobile home Home Access: Ramped entrance Home Layout: One level Bathroom Shower/Tub: Engineer, manufacturing systemsTub/shower unit Bathroom Toilet: Standard Bathroom  Accessibility: Yes Home Equipment: Grab bars - tub/shower, Bedside commode, Walker - 2 wheels, Cane - single point Additional Comments: drives; has 30 outdoor cats she takes care of   Functional History: Prior Function Level of Independence: Independent with assistive device(s)   Functional Status:  Mobility: Bed Mobility Overal bed mobility: Needs Assistance Bed Mobility: Supine to Sit Supine to sit: HOB elevated, Min assist General bed mobility comments: HOB elevated, + rail; MinA for R LE management, otherwise able to scoot around with increased time and effort with max cues for technique/hand placement Transfers Overall transfer level: Needs assistance Equipment used: Rolling walker (2 wheeled) Transfers: Lateral/Scoot Transfers Sit to Stand: Mod assist, From elevated surface (ModAx3)  Lateral/Scoot Transfers: Max assist, +2 physical assistance General transfer comment: continues to require maxAx2 to perform lateral scoots into recliner, and continues to require max intervention to maintain WB precautions even with scooting. Very little recall of appropriate technique for lateral scooting manuever Ambulation/Gait General Gait Details: unable   ADL: ADL Overall ADL's : Needs assistance/impaired Eating/Feeding: Set up, Sitting Grooming: Set up, Sitting Upper Body Bathing: Minimal assistance, Sitting Lower Body Bathing: Maximal assistance, Sit to/from stand, +2 for physical assistance Upper Body Dressing : Minimal assistance, Sitting Lower Body Dressing: Maximal assistance, +2 for physical assistance, Sit to/from stand Toilet Transfer: Maximal assistance, +2 for physical assistance, +2 for safety/equipment, Transfer board Toilet Transfer Details (indicate cue type and reason): Max A +2 for lateral scoot to drop arm recliner. second person to elevate RLE Functional mobility during ADLs: Maximal assistance, +2 for physical assistance General ADL Comments: Pt presenting with  decreased strength and balance   Cognition: Cognition Overall Cognitive Status: Within Functional Limits for tasks assessed Orientation Level: Oriented X4 Cognition Arousal/Alertness: Awake/alert Behavior During Therapy: WFL for tasks assessed/performed Overall Cognitive Status: Within Functional Limits for tasks assessed General Comments: HOH, definite reduced insight into precautions as well as impaired STM. needed multimodal cues for maintaining NWB R LE   Physical Exam: Blood pressure (!) 113/51, pulse 88, temperature 98 F (36.7 C), temperature source Oral, resp. rate 18, height 5\' 4"  (1.626 m), weight 72.6 kg, SpO2 97 %. Physical Exam Constitutional:      Appearance: Normal appearance.  HENT:     Head: Normocephalic.     Right Ear: External ear normal.     Left Ear: External ear normal.     Nose: Nose normal.     Mouth/Throat:     Pharynx: Oropharynx is clear. No oropharyngeal exudate or posterior oropharyngeal erythema.  Eyes:     Extraocular Movements: Extraocular movements intact.     Pupils: Pupils are equal, round, and reactive to light.  Cardiovascular:     Rate and Rhythm: Normal rate.     Heart sounds: Murmur heard.     Comments: Occ PAC  Pulmonary:     Effort: Pulmonary effort is normal. No respiratory distress.     Breath sounds: No wheezing.  Abdominal:     General: Bowel sounds are normal. There is no distension.     Tenderness: There is no abdominal tenderness.  Musculoskeletal:     Cervical back: Normal range of motion.     Comments: Right thigh and knee swollen, bruised. Expectedly tender to palpation and attempts at PROM.  Skin:    Comments: Incision is dressed with scant blood beneath foam dressing. Left buttock wound Neurological:     Mental Status: She is alert and oriented to person, place, and time.     Comments: Patient is alert.  Good insight and awareness. Mild STM deficits.  No acute distress.  Makes eye contact with examiner.  Follows simple  commands. HOH. Otherwise CN normal. No focal sensory loss in limbs. Motor 4+/5 UE bilaterally. RLE 1/5 d/t pain at hip and knee and 4/5 ADF/PF. LLE 2+ to 3-/5 HF, 3/5 KE and 4/5 ADF/PF. DTR's 1+ no focal limb ataxia or tremor appreciated while supine in bed  Psychiatric:        Mood and Affect: Mood normal.        Behavior: Behavior normal.      Lab Results Last 48 Hours        Results for orders placed or performed during the hospital encounter of 11/29/20 (from the past 48 hour(s))  CBC     Status: Abnormal    Collection Time: 12/04/20  1:35 AM  Result Value Ref Range    WBC 8.2 4.0 - 10.5 K/uL    RBC 2.53 (L) 3.87 - 5.11 MIL/uL    Hemoglobin 8.0 (L) 12.0 - 15.0 g/dL    HCT 14.7 (L) 82.9 - 46.0 %    MCV 98.0 80.0 - 100.0 fL    MCH 31.6 26.0 - 34.0 pg    MCHC 32.3 30.0 - 36.0 g/dL    RDW 56.2 13.0 - 86.5 %    Platelets 286 150 - 400 K/uL    nRBC 0.0 0.0 - 0.2 %      Comment: Performed at [facility], [address], [place], [place] [address]      Imaging Results (Last 48 hours)  No results found.           Medical Problem List and Plan: 1.  Right distal femur fracture with intercondylar extension secondary to fall.  Status post ORIF 12/02/2020.  Nonweightbearing             -patient may shower             -ELOS/Goals: 17-21 days, supervision to min assist goals 2.  Antithrombotics: -DVT/anticoagulation: Check vascular study Pharmaceutical: Lovenox             -antiplatelet therapy: Aspirin 81 mg daily 3. Pain Management: Hydrocodone as needed 4. Mood: Provide emotional support             -antipsychotic agents: N/A 5. Neuropsych: This patient is capable of making decisions on her own behalf. 6. Skin/Wound Care: Routine skin checks. Local wound care 7. Fluids/Electrolytes/Nutrition: Routine in and outs with follow-up chemistries 8.  Acute blood loss anemia.  Follow-up CBC 9.  Hyperlipidemia.  Lipitor 10.  Hypertension.  Norvasc.  Monitor with increased  mobility 11.  Hypothyroidism.  Synthroid 12.  History of CVA with resulting gait abnormality.  Continue aspirin     Charlton Amor, PA-C  12/05/2020   I have personally performed a face to face diagnostic evaluation of this patient and formulated the key components of the plan.  Additionally, I have personally reviewed laboratory data, imaging studies, as well as relevant notes and concur with the physician assistant's documentation above.  The patient's status has not changed from the original H&P.  Any changes in documentation from the acute care chart have been noted above.  Ranelle Oyster, MD, [place]

## 2020-12-05 NOTE — Progress Notes (Signed)
Occupational Therapy Treatment  Pt progressing towards established OT goals. Pt performing lateral scoot with Mod A +2. Requiring Max cues for sequencing. Pt presenting with decreased orientation and problem solving compared to prior sessions; notified RN. Recommend dc to CIR and will continue to follow acutely as admitted.    12/05/20 1200  OT Visit Information  Last OT Received On 12/05/20  Assistance Needed +2  History of Present Illness 87yo female who presented on 11/29/20 after a fall at home, found to have R distal femur fracture. Received R femur ORIF on 11/30/20. PMH HTN, CVA, L hip fracture s/p ORIF  Precautions  Precautions Fall  Pain Assessment  Pain Assessment Faces  Faces Pain Scale 2  Pain Location R LE with mobility  Pain Descriptors / Indicators Aching;Sore;Discomfort  Pain Intervention(s) Limited activity within patient's tolerance;Monitored during session;Repositioned  Cognition  Arousal/Alertness Awake/alert  Behavior During Therapy WFL for tasks assessed/performed  Overall Cognitive Status Impaired/Different from baseline  Area of Impairment Orientation;Problem solving  Orientation Level Disoriented to;Time;Situation;Place  Problem Solving Slow processing  General Comments Noting pt with increased confusion this session. This patient is known to this therapist from eval. Pt with decreased orientation and processing. Able to reorient with appropiate questions and cues; pt reports she was napping and tired. as session progressed, pt more arousal and processing more quickly. Typically, this patient is very aware. Despite confusion, pt continues to be very motivated and agreeable to education/cues.  Upper Extremity Assessment  Upper Extremity Assessment Generalized weakness  Lower Extremity Assessment  Lower Extremity Assessment Defer to PT evaluation  ADL  Overall ADL's  Needs assistance/impaired  Toilet Transfer Moderate assistance;+2 for physical assistance;+2 for  safety/equipment;Transfer board;Requires drop arm  Toilet Transfer Details (indicate cue type and reason) Mod A for lateral scoot to L. Pt requiring Max cues for weight shift and sequencing. With education, pt able to participate more in lateral scoot  Functional mobility during ADLs Moderate assistance;+2 for physical assistance  General ADL Comments Continues to practice lateral scoot to drop arm reclienr  Bed Mobility  Overal bed mobility Needs Assistance  Bed Mobility Supine to Sit  Supine to sit HOB elevated;Min assist  General bed mobility comments Min A for managing RLE  Balance  Overall balance assessment History of Falls;Needs assistance  Sitting-balance support Bilateral upper extremity supported;Feet supported  Sitting balance-Leahy Scale Good  Restrictions  Weight Bearing Restrictions Yes  RLE Weight Bearing NWB  Transfers  Overall transfer level Needs assistance  Transfers Lateral/Scoot Transfers   Lateral/Scoot Transfers Mod assist;+2 physical assistance  General transfer comment Assist to faciltiate scooting of hips and to maintain wb status.  OT - End of Session  Equipment Utilized During Treatment Gait belt  Activity Tolerance Patient tolerated treatment well  Patient left in chair;with call bell/phone within reach;with chair alarm set  Nurse Communication Mobility status  OT Assessment/Plan  OT Plan Discharge plan needs to be updated  OT Visit Diagnosis Unsteadiness on feet (R26.81);Other abnormalities of gait and mobility (R26.89);Muscle weakness (generalized) (M62.81);Pain  Pain - Right/Left Right  Pain - part of body Leg  OT Frequency (ACUTE ONLY) Min 2X/week  Recommendations for Other Services PT consult  Follow Up Recommendations CIR  OT Equipment Other (comment) (Defer to next venue)  AM-PAC OT "6 Clicks" Daily Activity Outcome Measure (Version 2)  Help from another person eating meals? 4  Help from another person taking care of personal grooming? 3   Help from another person toileting, which includes using toliet,  bedpan, or urinal? 2  Help from another person bathing (including washing, rinsing, drying)? 2  Help from another person to put on and taking off regular upper body clothing? 3  Help from another person to put on and taking off regular lower body clothing? 2  6 Click Score 16  Progressive Mobility  What is the highest level of mobility based on the progressive mobility assessment? Level 2 (Chairfast) - Balance while sitting on edge of bed and cannot stand  Mobility Out of bed for toileting;Out of bed to chair with meals  OT Goal Progression  Progress towards OT goals Progressing toward goals  Acute Rehab OT Goals  Patient Stated Goal get more mobile, feed cats  OT Goal Formulation With patient  Time For Goal Achievement 12/15/20  Potential to Achieve Goals Good  ADL Goals  Pt Will Perform Lower Body Dressing with min assist;with adaptive equipment;sit to/from stand  Pt Will Transfer to Toilet with min assist;with transfer board;bedside commode  Pt Will Perform Toileting - Clothing Manipulation and hygiene with min assist;sitting/lateral leans  Additional ADL Goal #1 Pt will perform bed mobility with Supervision in preparation for ADLs  OT Time Calculation  OT Start Time (ACUTE ONLY) 1053  OT Stop Time (ACUTE ONLY) 1124  OT Time Calculation (min) 31 min  OT General Charges  $OT Visit 1 Visit  OT Treatments  $Self Care/Home Management  23-37 mins    Mitsuko Luera MSOT, OTR/L Acute Rehab Pager: [phone] Office: [phone]

## 2020-12-05 NOTE — Progress Notes (Signed)
PROGRESS NOTE    Andrea [name]  NAT:[id number] DOB: [birth year] DOA: 11/29/2020 PCP: [facility]   Brief Narrative:  Andrea [name] is a 87 y.o. 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  She came to the ED due to mechanical fall at home.   Upon arrival to ED, hemodynamically stable, slightly elevated WBC at 18.3.  CT head and cervical spine did not show any acute findings.  X-ray of the right knee confirms a distal right femur fracture.  Orthopedics was consulted at [facility] and reported that they could not treat her at this facility.  Orthopedics at [facility] was consulted and agreed to care for patient and recommended transfer to [facility].  Assessment & Plan:   Principal Problem:   Femur fracture, right [facility]) Active Problems:   Hypothyroidism   Hypertension   Gait disturbance, post-stroke   Status post revision of total hip   Leukocytosis   History of stroke   Hyperlipidemia   GERD (gastroesophageal reflux disease)   Acute right distal femur fracture secondary to fall: S/p ORIF with orthopedics on 11/30/2020.  PT OT recommended SNF.  TOC consulted.  Pain controlled.  Management per orthopedics.  Acute postoperative blood loss anemia: Hemoglobin dropped from 12.8 preoperatively to 9.6 postoperatively and improved somewhat today to 8.0 yesterday.  no indication of transfusion.  Monitor daily and transfuse if hemoglobin drops less than 7.   Hypothyroidism: Continue Synthroid.   Essential hypertension: Controlled.  Continue amlodipine.  History of stroke with chronic gait instability/hyperlipidemia: Continue aspirin and continue atorvastatin.  GERD: Continue Pepcid.  Placement: TOC working on placement.  DVT prophylaxis: enoxaparin (LOVENOX)  injection 40 mg Start: 12/01/20 0800 SCDs Start: 11/30/20 1619   Code Status: Full Code  Family Communication: None present at bedside.  Plan of care discussed with patient in length and he verbalized understanding and agreed with it.  Status is: Inpatient  Remains inpatient appropriate because: Unsafe discharge plan/awaiting SNF placement  Dispo: The patient is from: Home              Anticipated d/c is to: SNF              Patient currently is medically stable to d/c.   Difficult to place patient No        Estimated body mass index is 27.46 kg/m as calculated from the following:   Height as of this encounter: 5\' 4"  (1.626 m).   Weight as of this encounter: 72.6 kg.    Nutritional Assessment: Body mass index is 27.46 kg/m. Seen by dietician.  I agree with the assessment and plan as outlined below: Nutrition Status: Nutrition Problem: Increased nutrient needs Etiology: post-op healing Signs/Symptoms: estimated needs Interventions: Ensure Enlive (each supplement provides 350kcal and 20 grams of protein), MVI  .  Skin Assessment: I have examined the patient's skin and I agree with the wound assessment as performed by the wound care RN as outlined below:    Consultants:  Orthopedics  Procedures:  ORIF  Antimicrobials:  Anti-infectives (From admission, onward)    Start     Dose/Rate Route Frequency Ordered Stop   11/30/20 1900  ceFAZolin (ANCEF) IVPB 2g/100 mL premix        2 g 200 mL/hr over 30 Minutes Intravenous Every 6 hours 11/30/20  1618 12/01/20 0329   11/30/20 1115  ceFAZolin (ANCEF) IVPB 2g/100 mL premix        2 g 200 mL/hr over 30 Minutes Intravenous To [facility] 11/30/20 1059 11/30/20 1258          Subjective: Patient seen and examined.  She has no complaints.  Objective: Vitals:   12/04/20 0727 12/04/20 1523 12/04/20 1905 12/05/20 0700  BP: (!) 121/53 125/68 130/68 (!) 113/51  Pulse: 84 100 89 88  Resp: 16 16 16 18   Temp:  98.7 F (37.1 C)  98.5 F (36.9 C) 98 F (36.7 C)  TempSrc: Oral  Oral Oral  SpO2: 96% 97% 98% 97%  Weight:      Height:        Intake/Output Summary (Last 24 hours) at 12/05/2020 1058 Last data filed at 12/05/2020 0700 Gross per 24 hour  Intake 120 ml  Output --  Net 120 ml    Filed Weights   11/29/20 1156 11/30/20 1140  Weight: 72.6 kg 72.6 kg    Examination:  General exam: Appears calm and comfortable  Respiratory system: Clear to auscultation. Respiratory effort normal. Cardiovascular system: S1 & S2 heard, RRR. No JVD, murmurs, rubs, gallops or clicks. No pedal edema. Gastrointestinal system: Abdomen is nondistended, soft and nontender. No organomegaly or masses felt. Normal bowel sounds heard. Central nervous system: Alert and oriented. No focal neurological deficits. Skin: No rashes, lesions or ulcers.  Psychiatry: Judgement and insight appear normal. Mood & affect appropriate.    Data Reviewed: I have personally reviewed following labs and imaging studies  CBC: Recent Labs  Lab 11/29/20 1313 11/30/20 0052 12/01/20 0245 12/02/20 0148 12/03/20 0106 12/04/20 0135  WBC 18.3* 10.5 9.6 9.4 8.9 8.2  NEUTROABS 15.4*  --  6.9  --   --   --   HGB 12.8 11.1* 9.6* 8.0* 7.6* 8.0*  HCT 39.1 33.1* 29.0* 24.0* 22.8* 24.8*  MCV 98.0 95.1 96.3 96.4 96.6 98.0  PLT 320 258 199 193 215 286    Basic Metabolic Panel: Recent Labs  Lab 11/29/20 1313 11/30/20 0052 12/01/20 0245 12/02/20 0148  NA 141 140 137 137  K 4.0 4.5 4.3 3.7  CL 106 108 104 105  CO2 28 21* 24 25  GLUCOSE 145* 129* 133* 130*  BUN 23 23 22  26*  CREATININE 0.92 0.85 0.94 0.91  CALCIUM 9.3 8.9 8.6* 8.4*  8.4*    GFR: Estimated Creatinine Clearance: 37.7 mL/min (by C-G formula based on SCr of 0.91 mg/dL). Liver Function Tests: Recent Labs  Lab 12/02/20 0148  AST 31  ALT 10  ALKPHOS 49  BILITOT 1.0  PROT 5.0*  ALBUMIN 2.6*    No results for input(s): LIPASE, AMYLASE in the last 168  hours. No results for input(s): AMMONIA in the last 168 hours. Coagulation Profile: No results for input(s): INR, PROTIME in the last 168 hours. Cardiac Enzymes: No results for input(s): CKTOTAL, CKMB, CKMBINDEX, TROPONINI in the last 168 hours. BNP (last 3 results) No results for input(s): PROBNP in the last 8760 hours. HbA1C: No results for input(s): HGBA1C in the last 72 hours. CBG: No results for input(s): GLUCAP in the last 168 hours. Lipid Profile: No results for input(s): CHOL, HDL, LDLCALC, TRIG, CHOLHDL, LDLDIRECT in the last 72 hours. Thyroid Function Tests: No results for input(s): TSH, T4TOTAL, FREET4, T3FREE, THYROIDAB in the last 72 hours. Anemia Panel: No results for input(s): VITAMINB12, FOLATE, FERRITIN, TIBC, IRON, RETICCTPCT in the last 72 hours. Sepsis  Labs: No results for input(s): PROCALCITON, LATICACIDVEN in the last 168 hours.  Recent Results (from the past 240 hour(s))  SARS CORONAVIRUS 2 (TAT 6-24 HRS) Nasopharyngeal Nasopharyngeal Swab     Status: None   Collection Time: 11/29/20  6:17 PM   Specimen: Nasopharyngeal Swab  Result Value Ref Range Status   SARS Coronavirus 2 NEGATIVE NEGATIVE Final    Comment: (NOTE) SARS-CoV-2 target nucleic acids are NOT DETECTED.  The SARS-CoV-2 RNA is generally detectable in upper and lower respiratory specimens during the acute phase of infection. Negative results do not preclude SARS-CoV-2 infection, do not rule out co-infections with other pathogens, and should not be used as the sole basis for treatment or other patient management decisions. Negative results must be combined with clinical observations, patient history, and epidemiological information. The expected result is Negative.  Fact Sheet for Patients: HairSlick.no  Fact Sheet for Healthcare Providers: quierodirigir.com  This test is not yet approved or cleared by the [place] FDA and  has  been authorized for detection and/or diagnosis of SARS-CoV-2 by FDA under an Emergency Use Authorization (EUA). This EUA will remain  in effect (meaning this test can be used) for the duration of the COVID-19 declaration under Se ction 564(b)(1) of the Act, 21 U.S.C. section 360bbb-3(b)(1), unless the authorization is terminated or revoked sooner.  Performed at [facility], [address], [place], [place] [address]   Surgical pcr screen     Status: None   Collection Time: 11/30/20 10:15 AM   Specimen: Nasal Mucosa; Nasal Swab  Result Value Ref Range Status   MRSA, PCR NEGATIVE NEGATIVE Final   Staphylococcus aureus NEGATIVE NEGATIVE Final    Comment: (NOTE) The Xpert SA Assay (FDA approved for NASAL specimens in patients 43 years of age and older), is one component of a comprehensive surveillance program. It is not intended to diagnose infection nor to guide or monitor treatment. Performed at [facility], [address], [place], [place] [address]       Radiology Studies: No results found.  Scheduled Meds:  amLODipine  5 mg Oral Daily   vitamin C  1,000 mg Oral Daily   aspirin EC  81 mg Oral Daily   atorvastatin  40 mg Oral q1800   docusate sodium  100 mg Oral BID   enoxaparin (LOVENOX) injection  40 mg Subcutaneous Q24H   famotidine  20 mg Oral QHS   levothyroxine  88 mcg Oral Daily   multivitamin with minerals  1 tablet Oral Daily   Continuous Infusions:  sodium chloride 35 mL/hr at 11/30/20 1809     LOS: 6 days   Time spent: 25 minutes   Hughie Closs, MD [facility]  12/05/2020, 10:58 AM   How to contact the [facility] Attending or Consulting provider 7A - 7P or covering provider during after hours 7P -7A, for this patient?  Check the care team in [facility] and look for a) attending/consulting [facility] provider listed and b) the [facility] team listed. Page or secure chat 7A-7P. Log into [email or web address] and use [place] universal password to access. If you do  not have the password, please contact the hospital operator. Locate the [facility] provider you are looking for under [facility] and page to a number that you can be directly reached. If you still have difficulty reaching the provider, please page the [facility] (Director on Call) for the Hospitalists listed on amion for assistance.

## 2020-12-05 NOTE — Progress Notes (Signed)
Inpatient Rehab Admissions Coordinator:   I have a bed for this Pt. On CIR today and plan to admit. RN may call report to [phone].  Megan Salon, MS, CCC-SLP Rehab Admissions Coordinator  [phone] (celll) [phone] (office)

## 2020-12-05 NOTE — Discharge Summary (Signed)
Physician Discharge Summary  Andrea [name] ZOX:[id number] DOB: 1926-03-26 DOA: 11/29/2020  PCP: [facility]  Admit date: 11/29/2020 Discharge date: 12/05/2020 30 Day Unplanned Readmission Risk Score    Flowsheet Row ED to Hosp-Admission (Current) from 11/29/2020 in [facility]  30 Day Unplanned Readmission Risk Score (%) 13.37 Filed at 12/05/2020 1200       This score is the patient's risk of an unplanned readmission within 30 days of being discharged (0 -100%). The score is based on dignosis, age, lab data, medications, orders, and past utilization.   Low:  0-14.9   Medium: 15-21.9   High: 22-29.9   Extreme: 30 and above          Admitted From: Home Disposition: CIR  Recommendations for Outpatient Follow-up:  Follow up with PCP in 1-2 weeks Please obtain BMP/CBC in one week Please follow up with your PCP on the following pending results: Unresulted Labs (From admission, onward)     Start     Ordered   12/06/20 0500  CBC  Tomorrow morning,   R        12/05/20 1059   Signed and Held  CBC  (enoxaparin (LOVENOX)    CrCl >/= 30 ml/min)  Once,   R       Comments: Baseline for enoxaparin therapy IF NOT ALREADY DRAWN.  Notify MD if PLT < 100 K.    Signed and Held   Signed and Held  Creatinine, serum  (enoxaparin (LOVENOX)    CrCl >/= 30 ml/min)  Once,   R       Comments: Baseline for enoxaparin therapy IF NOT ALREADY DRAWN.    Signed and Held   Signed and Held  Creatinine, serum  (enoxaparin (LOVENOX)    CrCl >/= 30 ml/min)  Weekly,   R     Comments: while on enoxaparin therapy    Signed and Held   Signed and Held  Comprehensive metabolic panel  Tomorrow morning,   R        Signed and Held   Signed and Held  CBC WITH DIFFERENTIAL  Tomorrow morning,   R        Signed and Held              Home Health: None Equipment/Devices: None  Discharge Condition: Stable CODE STATUS: Full code Diet recommendation:  Cardiac  Subjective: Seen and examined.  No complaints.  Brief/Interim Summary: Andrea [name] is a 87 y.o. 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  She came to the ED due to mechanical fall at home.   Upon arrival to ED, hemodynamically stable, slightly elevated WBC at 18.3.  CT head and cervical spine did not show any acute findings.  X-ray of the right knee confirms a distal right femur fracture.  Orthopedics was consulted at [facility] [place] [facility] and reported that they could not treat her at this facility.  Orthopedics at [facility] was consulted and agreed to care for patient and recommended transfer to [facility]  Acute right distal femur fracture secondary to fall: S/p ORIF with orthopedics on 11/30/2020.  PT OT recommended SNF.  TOC consulted.  Patient was somehow evaluated by CIR physician and accepted for CIR today.  She is medically stable so she is going to  be discharged.  Per orthopedics recommendation, she will continue Lovenox for total of 21 days for DVT prophylaxis.   Acute postoperative blood loss anemia: Hemoglobin dropped from 12.8 preoperatively to 9.6 postoperatively and improved somewhat today to 8.0 yesterday.  no indication of transfusion.    Essential hypertension: Controlled.  Continue amlodipine.   History of stroke with chronic gait instability/hyperlipidemia: Continue aspirin and continue atorvastatin.   Discharge Diagnoses:  Principal Problem:   Femur fracture, right (HCC) Active Problems:   Hypothyroidism   Hypertension   Gait disturbance, post-stroke   Status post revision of total hip   Leukocytosis   History of stroke   Hyperlipidemia   GERD (gastroesophageal reflux disease)    Discharge Instructions   Allergies as of 12/05/2020   No Known  Allergies      Medication List     STOP taking these medications    famotidine 20 MG tablet Commonly known as: PEPCID       TAKE these medications    acetaminophen 325 MG tablet Commonly known as: TYLENOL Take 2 tablets (650 mg total) by mouth every 4 (four) hours as needed for mild pain (or temp > 37.5 C (99.5 F)).   amLODipine 5 MG tablet Commonly known as: NORVASC Take 5 mg by mouth daily.   Aspirin Adult Low Strength 81 MG EC tablet Generic drug: aspirin Take 81 mg by mouth daily.   atorvastatin 20 MG tablet Commonly known as: LIPITOR Take 20 mg by mouth daily. What changed: Another medication with the same name was removed. Continue taking this medication, and follow the directions you see here.   enoxaparin 40 MG/0.4ML injection Commonly known as: LOVENOX Inject 0.4 mLs (40 mg total) into the skin daily for 17 days. Start taking on: December 06, 2020   levothyroxine 88 MCG tablet Commonly known as: SYNTHROID Take 88 mcg by mouth daily.   solifenacin 5 MG tablet Commonly known as: VESICARE Take 5 mg by mouth daily.   Vitamin D (Ergocalciferol) 1.25 MG (50000 UNIT) Caps capsule Commonly known as: DRISDOL Take 50,000 Units by mouth once a week.        Follow-up Information     [facility] Follow up in 1 week(s).   Specialty: General Practice Contact information: [address] [place] [address] [phone]                No Known Allergies  Consultations: Orthopedics   Procedures/Studies: CT Head Wo Contrast  Result Date: 11/29/2020 CLINICAL DATA:  Polytrauma EXAM: CT HEAD WITHOUT CONTRAST CT CERVICAL SPINE WITHOUT CONTRAST TECHNIQUE: Multidetector CT imaging of the head and cervical spine was performed following the standard protocol without intravenous contrast. Multiplanar CT image reconstructions of the cervical spine were also generated. COMPARISON:  CT head dated January 25, 2017 FINDINGS: CT HEAD  FINDINGS Brain: No evidence of acute infarction, hemorrhage, hydrocephalus, extra-axial collection or mass lesion/mass effect. Chronic white matter ischemic change. Vascular: No hyperdense vessel or unexpected calcification. Skull: Normal. Negative for fracture or focal lesion. Sinuses/Orbits: No acute finding. Other: Mild soft tissue swelling swelling overlying the right frontal bone. CT CERVICAL SPINE FINDINGS Alignment: Mild grade 1 anterolisthesis of C2 on C3 and mild retrolisthesis of C4 on C5, favored to be degenerative. Skull base and vertebrae: No acute fracture. No primary bone lesion or focal pathologic process. Soft tissues and spinal canal: No prevertebral fluid or swelling. No visible canal hematoma. Disc levels:  Multilevel moderate degenerative disc disease. Upper chest:  Negative. Other: None IMPRESSION: No acute intracranial abnormality. No evidence of skull or cervical spine fracture. Electronically Signed   By: Allegra LaiLeah  Strickland M.D.   On: 11/29/2020 13:36   CT Cervical Spine Wo Contrast  Result Date: 11/29/2020 CLINICAL DATA:  Polytrauma EXAM: CT HEAD WITHOUT CONTRAST CT CERVICAL SPINE WITHOUT CONTRAST TECHNIQUE: Multidetector CT imaging of the head and cervical spine was performed following the standard protocol without intravenous contrast. Multiplanar CT image reconstructions of the cervical spine were also generated. COMPARISON:  CT head dated January 25, 2017 FINDINGS: CT HEAD FINDINGS Brain: No evidence of acute infarction, hemorrhage, hydrocephalus, extra-axial collection or mass lesion/mass effect. Chronic white matter ischemic change. Vascular: No hyperdense vessel or unexpected calcification. Skull: Normal. Negative for fracture or focal lesion. Sinuses/Orbits: No acute finding. Other: Mild soft tissue swelling swelling overlying the right frontal bone. CT CERVICAL SPINE FINDINGS Alignment: Mild grade 1 anterolisthesis of C2 on C3 and mild retrolisthesis of C4 on C5, favored to be  degenerative. Skull base and vertebrae: No acute fracture. No primary bone lesion or focal pathologic process. Soft tissues and spinal canal: No prevertebral fluid or swelling. No visible canal hematoma. Disc levels:  Multilevel moderate degenerative disc disease. Upper chest: Negative. Other: None IMPRESSION: No acute intracranial abnormality. No evidence of skull or cervical spine fracture. Electronically Signed   By: Allegra LaiLeah  Strickland M.D.   On: 11/29/2020 13:36   CT KNEE RIGHT WO CONTRAST  Result Date: 11/29/2020 CLINICAL DATA:  Fracture, knee EXAM: CT OF THE right KNEE WITHOUT CONTRAST TECHNIQUE: Multidetector CT imaging of the right knee was performed according to the standard protocol. Multiplanar CT image reconstructions were also generated. COMPARISON:  Radiograph 11/30/2018 FINDINGS: Bones/Joint/Cartilage Diffuse osteopenia. There is an impacted fracture through the distal femoral metaphysis, which extends into the knee joint through the intercondylar notch anteriorly. There is half shaft posterior displacement, approximately 1.5 cm, and 1.7 cm lateral displacement. There is lateral angulation. There is a small linear ossific fragment at the expected location of the MCL attachment on the medial femoral condyle (coronal series 5, image 83). There is severe tricompartment osteoarthritis with articular surface irregularity and moderate-severe joint space narrowing. Chondrocalcinosis. Small joint effusion. Patella baja. Ligaments Suboptimally assessed by CT. Muscles and Tendons No intramuscular fluid collection.  Extensive edema. Soft tissues Superficial soft tissue swelling.  No focal fluid collection. IMPRESSION: Displaced, angulated, and impacted distal femoral metaphyseal fracture, with intra-articular extension into the knee joint through the intercondylar notch. Small joint effusion. Diffuse osteopenia. Possible small proximal MCL avulsion fracture. Severe tricompartment osteoarthritis. Electronically  Signed   By: Caprice RenshawJacob  Kahn M.D.   On: 11/29/2020 16:22   Chest Portable 1 View  Result Date: 11/29/2020 CLINICAL DATA:  Preoperative. Right knee fracture. History of hypertension EXAM: PORTABLE CHEST 1 VIEW COMPARISON:  Chest x-ray 01/25/17 FINDINGS: The heart and mediastinal contours unchanged.  Aortic calcification. No focal consolidation. No pulmonary edema. No pleural effusion. No pneumothorax. No acute osseous abnormality. Bilateral shoulder degenerative changes. IMPRESSION: No active disease. Electronically Signed   By: Tish FredericksonMorgane  Naveau M.D.   On: 11/29/2020 16:31   DG Knee Complete 4 Views Right  Result Date: 11/29/2020 CLINICAL DATA:  Fall, pain and swelling of the right knee EXAM: RIGHT KNEE - COMPLETE 4+ VIEW COMPARISON:  None. FINDINGS: Osteopenia. There is a transverse, impacted fracture of the distal right femoral metadiaphysis. There is no definite intercondylar fracture component appreciated. Severe tricompartmental arthrosis. Soft tissue edema anteriorly. IMPRESSION: 1. Transverse, impacted fracture of the distal  right femoral metadiaphysis. There is no definite intercondylar fracture component appreciated, however assessment of fracture anatomy is limited by osteopenia. 2. Severe tricompartmental arthrosis. Electronically Signed   By: Lauralyn Primes M.D.   On: 11/29/2020 13:39   DG Knee Right Port  Result Date: 11/30/2020 CLINICAL DATA:  Postop EXAM: PORTABLE RIGHT KNEE - 1-2 VIEW COMPARISON:  11/29/2020 FINDINGS: Interval surgical plate and multiple screw fixation of comminuted distal femoral fracture with intact hardware and near anatomic alignment. Gas in the soft tissues consistent with recent surgery. Severe degenerative changes at the knee IMPRESSION: Interval surgical fixation of comminuted distal femoral fracture with expected postsurgical change. Electronically Signed   By: Jasmine Pang M.D.   On: 11/30/2020 20:31   DG C-Arm 1-60 Min-No Report  Result Date: 11/30/2020 Fluoroscopy  was utilized by the requesting physician.  No radiographic interpretation.   DG FEMUR, MIN 2 VIEWS RIGHT  Result Date: 11/30/2020 CLINICAL DATA:  ORIF right distal femur EXAM: RIGHT FEMUR 2 VIEWS COMPARISON:  CT knee dated 11/29/2020 FLUOROSCOPY TIME:  1 minute 5 seconds 3.98 mGy FINDINGS: Intraoperative fluoroscopic radiographs during lateral compression plate and screw fixation of a comminuted distal femur fracture. Fracture fragments are in near anatomic alignment and position. IMPRESSION: Intraoperative fluoroscopic radiographs during ORIF of a distal femur fracture, as above. Electronically Signed   By: Charline Bills M.D.   On: 11/30/2020 21:14     Discharge Exam: Vitals:   12/04/20 1905 12/05/20 0700  BP: 130/68 (!) 113/51  Pulse: 89 88  Resp: 16 18  Temp: 98.5 F (36.9 C) 98 F (36.7 C)  SpO2: 98% 97%   Vitals:   12/04/20 0727 12/04/20 1523 12/04/20 1905 12/05/20 0700  BP: (!) 121/53 125/68 130/68 (!) 113/51  Pulse: 84 100 89 88  Resp: 16 16 16 18   Temp: 98.7 F (37.1 C)  98.5 F (36.9 C) 98 F (36.7 C)  TempSrc: Oral  Oral Oral  SpO2: 96% 97% 98% 97%  Weight:      Height:        General: Pt is alert, awake, not in acute distress Cardiovascular: RRR, S1/S2 +, no rubs, no gallops Respiratory: CTA bilaterally, no wheezing, no rhonchi Abdominal: Soft, NT, ND, bowel sounds + Extremities: no edema, no cyanosis    The results of significant diagnostics from this hospitalization (including imaging, microbiology, ancillary and laboratory) are listed below for reference.     Microbiology: Recent Results (from the past 240 hour(s))  SARS CORONAVIRUS 2 (TAT 6-24 HRS) Nasopharyngeal Nasopharyngeal Swab     Status: None   Collection Time: 11/29/20  6:17 PM   Specimen: Nasopharyngeal Swab  Result Value Ref Range Status   SARS Coronavirus 2 NEGATIVE NEGATIVE Final    Comment: (NOTE) SARS-CoV-2 target nucleic acids are NOT DETECTED.  The SARS-CoV-2 RNA is generally  detectable in upper and lower respiratory specimens during the acute phase of infection. Negative results do not preclude SARS-CoV-2 infection, do not rule out co-infections with other pathogens, and should not be used as the sole basis for treatment or other patient management decisions. Negative results must be combined with clinical observations, patient history, and epidemiological information. The expected result is Negative.  Fact Sheet for Patients: 12/01/20  Fact Sheet for Healthcare Providers: HairSlick.no  This test is not yet approved or cleared by the [place] FDA and  has been authorized for detection and/or diagnosis of SARS-CoV-2 by FDA under an Emergency Use Authorization (EUA). This EUA will remain  in  effect (meaning this test can be used) for the duration of the COVID-19 declaration under Se ction 564(b)(1) of the Act, 21 U.S.C. section 360bbb-3(b)(1), unless the authorization is terminated or revoked sooner.  Performed at [facility], [address] [place], [place] [address]   Surgical pcr screen     Status: None   Collection Time: 11/30/20 10:15 AM   Specimen: Nasal Mucosa; Nasal Swab  Result Value Ref Range Status   MRSA, PCR NEGATIVE NEGATIVE Final   Staphylococcus aureus NEGATIVE NEGATIVE Final    Comment: (NOTE) The Xpert SA Assay (FDA approved for NASAL specimens in patients 57 years of age and older), is one component of a comprehensive surveillance program. It is not intended to diagnose infection nor to guide or monitor treatment. Performed at [facility], [address], [place], [place] [address]      Labs: BNP (last 3 results) No results for input(s): BNP in the last 8760 hours. Basic Metabolic Panel: Recent Labs  Lab 11/29/20 1313 11/30/20 0052 12/01/20 0245 12/02/20 0148  NA 141 140 137 137  K 4.0 4.5 4.3 3.7  CL 106 108 104 105  CO2 28 21* 24 25   GLUCOSE 145* 129* 133* 130*  BUN 23 23 22  26*  CREATININE 0.92 0.85 0.94 0.91  CALCIUM 9.3 8.9 8.6* 8.4*  8.4*   Liver Function Tests: Recent Labs  Lab 12/02/20 0148  AST 31  ALT 10  ALKPHOS 49  BILITOT 1.0  PROT 5.0*  ALBUMIN 2.6*   No results for input(s): LIPASE, AMYLASE in the last 168 hours. No results for input(s): AMMONIA in the last 168 hours. CBC: Recent Labs  Lab 11/29/20 1313 11/30/20 0052 12/01/20 0245 12/02/20 0148 12/03/20 0106 12/04/20 0135  WBC 18.3* 10.5 9.6 9.4 8.9 8.2  NEUTROABS 15.4*  --  6.9  --   --   --   HGB 12.8 11.1* 9.6* 8.0* 7.6* 8.0*  HCT 39.1 33.1* 29.0* 24.0* 22.8* 24.8*  MCV 98.0 95.1 96.3 96.4 96.6 98.0  PLT 320 258 199 193 215 286   Cardiac Enzymes: No results for input(s): CKTOTAL, CKMB, CKMBINDEX, TROPONINI in the last 168 hours. BNP: Invalid input(s): POCBNP CBG: No results for input(s): GLUCAP in the last 168 hours. D-Dimer No results for input(s): DDIMER in the last 72 hours. Hgb A1c No results for input(s): HGBA1C in the last 72 hours. Lipid Profile No results for input(s): CHOL, HDL, LDLCALC, TRIG, CHOLHDL, LDLDIRECT in the last 72 hours. Thyroid function studies No results for input(s): TSH, T4TOTAL, T3FREE, THYROIDAB in the last 72 hours.  Invalid input(s): FREET3 Anemia work up No results for input(s): VITAMINB12, FOLATE, FERRITIN, TIBC, IRON, RETICCTPCT in the last 72 hours. Urinalysis    Component Value Date/Time   COLORURINE STRAW (A) 01/25/2017 0436   APPEARANCEUR CLEAR 01/25/2017 0436   LABSPEC 1.006 01/25/2017 0436   PHURINE 7.0 01/25/2017 0436   GLUCOSEU NEGATIVE 01/25/2017 0436   HGBUR NEGATIVE 01/25/2017 0436   BILIRUBINUR NEGATIVE 01/25/2017 0436   KETONESUR NEGATIVE 01/25/2017 0436   PROTEINUR NEGATIVE 01/25/2017 0436   UROBILINOGEN 0.2 01/29/2015 0536   NITRITE NEGATIVE 01/25/2017 0436   LEUKOCYTESUR NEGATIVE 01/25/2017 0436   Sepsis Labs Invalid input(s): PROCALCITONIN,  WBC,   LACTICIDVEN Microbiology Recent Results (from the past 240 hour(s))  SARS CORONAVIRUS 2 (TAT 6-24 HRS) Nasopharyngeal Nasopharyngeal Swab     Status: None   Collection Time: 11/29/20  6:17 PM   Specimen: Nasopharyngeal Swab  Result  Value Ref Range Status   SARS Coronavirus 2 NEGATIVE NEGATIVE Final    Comment: (NOTE) SARS-CoV-2 target nucleic acids are NOT DETECTED.  The SARS-CoV-2 RNA is generally detectable in upper and lower respiratory specimens during the acute phase of infection. Negative results do not preclude SARS-CoV-2 infection, do not rule out co-infections with other pathogens, and should not be used as the sole basis for treatment or other patient management decisions. Negative results must be combined with clinical observations, patient history, and epidemiological information. The expected result is Negative.  Fact Sheet for Patients: HairSlick.no  Fact Sheet for Healthcare Providers: quierodirigir.com  This test is not yet approved or cleared by the [place] FDA and  has been authorized for detection and/or diagnosis of SARS-CoV-2 by FDA under an Emergency Use Authorization (EUA). This EUA will remain  in effect (meaning this test can be used) for the duration of the COVID-19 declaration under Se ction 564(b)(1) of the Act, 21 U.S.C. section 360bbb-3(b)(1), unless the authorization is terminated or revoked sooner.  Performed at [facility] [place] [facility], [address] [place], [place] [address]   Surgical pcr screen     Status: None   Collection Time: 11/30/20 10:15 AM   Specimen: Nasal Mucosa; Nasal Swab  Result Value Ref Range Status   MRSA, PCR NEGATIVE NEGATIVE Final   Staphylococcus aureus NEGATIVE NEGATIVE Final    Comment: (NOTE) The Xpert SA Assay (FDA approved for NASAL specimens in patients 66 years of age and older), is one component of a comprehensive surveillance program. It is not  intended to diagnose infection nor to guide or monitor treatment. Performed at [facility], [address] [place], [place] [address]      Time coordinating discharge: Over 30 minutes  SIGNED:   Hughie Closs, MD  [facility] 12/05/2020, 12:39 PM  If 7PM-7AM, please contact night-coverage [email or web address]

## 2020-12-05 NOTE — Progress Notes (Signed)
PMR Admission Coordinator Pre-Admission Assessment  Patient: Andrea [name] is an 87 y.o., female MRN: [id number] DOB: 06/03/1925 Height: 5' 4" (162.6 cm) Weight: 72.6 kg  Insurance Information HMO:     PPO:      PCP:      IPA:      80/20: OTHER:  PRIMARY: Medicare Part A and B       Policy#: Subscriber: pt Phone#: Verified online    Fax#:  Pre-Cert#:       Employer:  Benefits:  Phone #:      Name:  Eff. Date: Parts A ad B effective 09/29/1992  Deduct: $1556      Out of Pocket Max:  None      Life Max: N/A  CIR: 100%      SNF: 100 days Outpatient: 80%     Co-Pay: 20% Home Health: 100%      Co-Pay: none DME: 80%     Co-Pay: 20% Providers: patient's choice SECONDARY: Medicaid       Policy#: [id number]     Phone#:   Financial Counselor:       Phone#:   The "Data Collection Information Summary" for patients in Inpatient Rehabilitation Facilities with attached "Privacy Act Statement-Health Care Records" was provided and verbally reviewed with: Patient  Emergency Contact Information Contact Information     Name Relation Home Work Mobile   Gregory,Tommy Friend [phone]         Current Medical History  Patient Admitting Diagnosis: R distal femur fracture  History of Present Illness:  Andrea [name] is a 87-year-old right-handed female with 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  Per chart review lives alone.  Mobile home ramped entrance.  Independent with assistive device.  Presented 11/29/2020 after mechanical fall while feeding her cats without loss of consciousness.  Cranial CT scan CT cervical spine negative.  Sustained right distal femur fracture with intercondylar extension.  Underwent ORIF 12/02/2020 per Dr. Handy.  Nonweightbearing right lower extremity.  Acute blood loss anemia 8.0 and monitored.  Placed on Lovenox for DVT prophylaxis.  Therapy evaluations completed  due to patient decreased functional mobility was admitted for a comprehensive rehab program.    Patient's medical record from [facility] has been reviewed by the rehabilitation admission coordinator and physician.  Past Medical History  Past Medical History:  Diagnosis Date   Hypertension    Thyroid disease     Has the patient had major surgery during 100 days prior to admission? Yes  Family History   family history includes CAD in her father; CAD (age of onset: 78) in her mother; Heart failure in her father.  Current Medications  Current Facility-Administered Medications:    0.9 %  sodium chloride infusion, , Intravenous, Continuous, Paul, Keith, PA-C, Last Rate: 35 mL/hr at 11/30/20 1809, New Bag at 11/30/20 1809   acetaminophen (TYLENOL) tablet 325-650 mg, 325-650 mg, Oral, Q6H PRN, Paul, Keith, PA-C   amLODipine (NORVASC) tablet 5 mg, 5 mg, Oral, Daily, Paul, Keith, PA-C, 5 mg at 12/05/20 0851   ascorbic acid (VITAMIN C) tablet 1,000 mg, 1,000 mg, Oral, Daily, Paul, Keith, PA-C, 1,000 mg at 12/05/20 0852   aspirin EC tablet 81 mg, 81 mg, Oral, Daily, Paul, Keith, PA-C, 81 mg at 12/05/20 0852   atorvastatin (LIPITOR) tablet 40 mg, 40 mg, Oral, q1800, Pahwani, Ravi, MD, 40 mg at 12/04/20 1732   bisacodyl (DULCOLAX) EC   tablet 5 mg, 5 mg, Oral, Daily PRN, Paul, Keith, PA-C, 5 mg at 12/02/20 1639   docusate sodium (COLACE) capsule 100 mg, 100 mg, Oral, BID, Paul, Keith, PA-C, 100 mg at 12/05/20 0852   enoxaparin (LOVENOX) injection 40 mg, 40 mg, Subcutaneous, Q24H, Paul, Keith, PA-C, 40 mg at 12/05/20 0853   famotidine (PEPCID) tablet 20 mg, 20 mg, Oral, QHS, Paul, Keith, PA-C, 20 mg at 12/04/20 2135   HYDROcodone-acetaminophen (NORCO/VICODIN) 5-325 MG per tablet 1-2 tablet, 1-2 tablet, Oral, Q6H PRN, Paul, Keith, PA-C, 1 tablet at 12/04/20 1509   levothyroxine (SYNTHROID) tablet 88 mcg, 88 mcg, Oral, Daily, Paul, Keith, PA-C, 88 mcg at 12/05/20 0615    menthol-cetylpyridinium (CEPACOL) lozenge 3 mg, 1 lozenge, Oral, PRN **OR** phenol (CHLORASEPTIC) mouth spray 1 spray, 1 spray, Mouth/Throat, PRN, Paul, Keith, PA-C   metoCLOPramide (REGLAN) tablet 5-10 mg, 5-10 mg, Oral, Q8H PRN **OR** metoCLOPramide (REGLAN) injection 5-10 mg, 5-10 mg, Intravenous, Q8H PRN, Paul, Keith, PA-C   morphine 2 MG/ML injection 0.5 mg, 0.5 mg, Intravenous, Q2H PRN, Paul, Keith, PA-C, 0.5 mg at 12/03/20 0848   multivitamin with minerals tablet 1 tablet, 1 tablet, Oral, Daily, Paul, Keith, PA-C, 1 tablet at 12/05/20 0852   ondansetron (ZOFRAN) tablet 4 mg, 4 mg, Oral, Q6H PRN, 4 mg at 11/30/20 2103 **OR** ondansetron (ZOFRAN) injection 4 mg, 4 mg, Intravenous, Q6H PRN, Paul, Keith, PA-C  Patients Current Diet:  Diet Order             Diet regular Room service appropriate? Yes with Assist; Fluid consistency: Thin  Diet effective now                   Precautions / Restrictions Precautions Precautions: Fall Restrictions Weight Bearing Restrictions: Yes RLE Weight Bearing: Non weight bearing   Has the patient had 2 or more falls or a fall with injury in the past year? Yes  Prior Activity Level Community (5-7x/wk): Pt. was active in the community PTA  Prior Functional Level Self Care: Did the patient need help bathing, dressing, using the toilet or eating? Independent  Indoor Mobility: Did the patient need assistance with walking from room to room (with or without device)? Independent  Stairs: Did the patient need assistance with internal or external stairs (with or without device)? Independent  Functional Cognition: Did the patient need help planning regular tasks such as shopping or remembering to take medications? Independent  Patient Information Are you of Hispanic, Latino/a,or Spanish origin?: A. No, not of Hispanic, Latino/a, or Spanish origin What is your race?: A. White Do you need or want an interpreter to communicate with a doctor or health  care staff?: 0. No  Patient's Response To:  Health Literacy and Transportation Is the patient able to respond to health literacy and transportation needs?: Yes Health Literacy - How often do you need to have someone help you when you read instructions, pamphlets, or other written material from your doctor or pharmacy?: Sometimes In the past 12 months, has lack of transportation kept you from medical appointments or from getting medications?: No In the past 12 months, has lack of transportation kept you from meetings, work, or from getting things needed for daily living?: No  Home Assistive Devices / Equipment Home Assistive Devices/Equipment: Walker (specify type) Home Equipment: Grab bars - tub/shower, Bedside commode, Walker - 2 wheels, Cane - single point  Prior Device Use: Indicate devices/aids used by the patient prior to current illness, exacerbation or injury? Walker    Current Functional Level Cognition  Overall Cognitive Status: Impaired/Different from baseline Orientation Level: Oriented X4 General Comments: Noting pt with increased confusion this session. This patient is known to this therapist from eval. Pt with decreased orientation and processing. Able to reorient with appropiate questions and cues; pt reports she was napping and tired. as session progressed, pt more arousal and processing more quickly. Typically, this patient is very aware. Despite confusion, pt continues to be very motivated and agreeable to education/cues.    Extremity Assessment (includes Sensation/Coordination)  Upper Extremity Assessment: Generalized weakness  Lower Extremity Assessment: Defer to PT evaluation    ADLs  Overall ADL's : Needs assistance/impaired Eating/Feeding: Set up, Sitting Grooming: Set up, Sitting Upper Body Bathing: Minimal assistance, Sitting Lower Body Bathing: Maximal assistance, Sit to/from stand, +2 for physical assistance Upper Body Dressing : Minimal assistance,  Sitting Lower Body Dressing: Maximal assistance, +2 for physical assistance, Sit to/from stand Toilet Transfer: Moderate assistance, +2 for physical assistance, +2 for safety/equipment, Transfer board, Requires drop arm Toilet Transfer Details (indicate cue type and reason): Mod A for lateral scoot to L. Pt requiring Max cues for weight shift and sequencing. With education, pt able to participate more in lateral scoot Functional mobility during ADLs: Moderate assistance, +2 for physical assistance General ADL Comments: Continues to practice lateral scoot to drop arm reclienr    Mobility  Overal bed mobility: Needs Assistance Bed Mobility: Supine to Sit Supine to sit: HOB elevated, Min assist General bed mobility comments: Min A for managing RLE    Transfers  Overall transfer level: Needs assistance Equipment used: Rolling walker (2 wheeled) Transfers: Lateral/Scoot Transfers Sit to Stand: Mod assist, From elevated surface (ModAx3)  Lateral/Scoot Transfers: Mod assist, +2 physical assistance General transfer comment: Assist to faciltiate scooting of hips and to maintain wb status.    Ambulation / Gait / Stairs / Wheelchair Mobility  Ambulation/Gait General Gait Details: unable    Posture / Balance Dynamic Sitting Balance Sitting balance - Comments: dynamically Balance Overall balance assessment: History of Falls, Needs assistance Sitting-balance support: Bilateral upper extremity supported, Feet supported Sitting balance-Leahy Scale: Good Sitting balance - Comments: dynamically Standing balance support: Bilateral upper extremity supported, During functional activity Standing balance-Leahy Scale: Poor Standing balance comment: reliant on BUE support and external assist    Special needs/care consideration Skin surgical incision, ecchymosis to the arm and head, hematoma on forehead   Previous Home Environment (from acute therapy documentation) Living Arrangements: Alone  Lives  With: Alone Available Help at Discharge: Friend(s), Available PRN/intermittently Type of Home: Mobile home Home Layout: One level Home Access: Ramped entrance Bathroom Shower/Tub: Tub/shower unit Bathroom Toilet: Standard Bathroom Accessibility: Yes Home Care Services: No Additional Comments: drives; has 30 outdoor cats she takes care of  Discharge Living Setting Plans for Discharge Living Setting: Alone Type of Home at Discharge: Mobile home Discharge Home Layout: One level Discharge Home Access: Ramped entrance Discharge Bathroom Shower/Tub: Tub/shower unit Discharge Bathroom Toilet: Standard Discharge Bathroom Accessibility: Yes How Accessible: Accessible via walker Does the patient have any problems obtaining your medications?: No  Social/Family/Support Systems Patient Roles: Other (Comment) Contact Information: [phone] Anticipated Caregiver: Woodie Crawford (neice) Anticipated Caregiver's Contact Information: [phone] Ability/Limitations of Caregiver: Pt. has friends to assist, wants neice to be called only after decisions made. Pt. Is going to look for friend Theresa's number because she states she will be the one staying with her.  Caregiver Availability: Intermittent Discharge Plan Discussed with Primary Caregiver: Yes Is Caregiver In Agreement   with Plan?: Yes Does Caregiver/Family have Issues with Lodging/Transportation while Pt is in Rehab?: No  Goals Patient/Family Goal for Rehab: PT/OT min A Expected length of stay: 18-21 days Pt/Family Agrees to Admission and willing to participate: Yes Program Orientation Provided & Reviewed with Pt/Caregiver Including Roles  & Responsibilities: Yes  Decrease burden of Care through IP rehab admission: Specialzed equipment needs, Decrease number of caregivers, Bowel and bladder program, and Patient/family education  Possible need for SNF placement upon discharge: not anticipated  Patient Condition: I have reviewed  medical records from  [facility] , spoken with CM, and patient, daughter, and family member. I met with patient at the bedside for inpatient rehabilitation assessment.  Patient will benefit from ongoing PT, OT, and SLP, can actively participate in 3 hours of therapy a day 5 days of the week, and can make measurable gains during the admission.  Patient will also benefit from the coordinated team approach during an Inpatient Acute Rehabilitation admission.  The patient will receive intensive therapy as well as Rehabilitation physician, nursing, social worker, and care management interventions.  Due to safety, skin/wound care, disease management, medication administration, pain management, and patient education the patient requires 24 hour a day rehabilitation nursing.  The patient is currently Mod-max+2  with mobility and basic ADLs.  Discharge setting and therapy post discharge at home with home health is anticipated.  Patient has agreed to participate in the Acute Inpatient Rehabilitation Program and will admit today.  Preadmission Screen Completed By:  Milica Gully B Jude Naclerio, 12/05/2020 1:50 PM ______________________________________________________________________   Discussed status with Dr. Swartz on 12/05/20 at 1100 and received approval for admission today.  Admission Coordinator:  Lillieanna Tuohy B Adir Schicker, CCC-SLP, time 1400/Date 12/05/2020   Assessment/Plan: Diagnosis: right distal femur fracture after fall Does the need for close, 24 hr/day Medical supervision in concert with the patient's rehab needs make it unreasonable for this patient to be served in a less intensive setting? Yes Co-Morbidities requiring supervision/potential complications: right thalamic infarc with residual balance deficits, ABLA, htn, pain mgt Due to bladder management, bowel management, safety, skin/wound care, disease management, medication administration, pain management, and patient education, does the patient require 24  hr/day rehab nursing? Yes Does the patient require coordinated care of a physician, rehab nurse, PT, OT to address physical and functional deficits in the context of the above medical diagnosis(es)? Yes Addressing deficits in the following areas: balance, endurance, locomotion, strength, transferring, bowel/bladder control, bathing, dressing, feeding, grooming, toileting, and swallowing Can the patient actively participate in an intensive therapy program of at least 3 hrs of therapy 5 days a week? Yes The potential for patient to make measurable gains while on inpatient rehab is excellent Anticipated functional outcomes upon discharge from inpatient rehab: supervision and min assist PT, supervision and min assist OT, n/a SLP Estimated rehab length of stay to reach the above functional goals is: 17-21 days Anticipated discharge destination: Home 10. Overall Rehab/Functional Prognosis: excellent   MD Signature: Zachary T. Swartz, MD, FAAPMR [facility] 12/05/2020  

## 2020-12-05 NOTE — H&P (Signed)
Physical Medicine and Rehabilitation Admission H&P    Chief Complaint  Patient presents with   Fall  : HPI: Andrea [name] is a 87 year old right-handed female with 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  Per chart review lives alone.  Mobile home ramped entrance.  Independent with assistive device.  Presented 11/29/2020 after mechanical fall while feeding her cats without loss of consciousness.  Cranial CT scan CT cervical spine negative.  Sustained right distal femur fracture with intercondylar extension.  Underwent ORIF 12/02/2020 per Dr. Carola Frost.  Nonweightbearing right lower extremity.  Acute blood loss anemia 8.0 and monitored.  Placed on Lovenox for DVT prophylaxis.  Therapy evaluations completed due to patient decreased functional mobility was admitted for a comprehensive rehab program.  Review of Systems  Constitutional:  Negative for chills and fever.  HENT:  Negative for hearing loss.   Eyes:  Negative for blurred vision and double vision.  Respiratory:  Negative for cough and shortness of breath.   Cardiovascular:  Negative for chest pain, palpitations and leg swelling.  Gastrointestinal:  Positive for constipation. Negative for heartburn, nausea and vomiting.  Genitourinary:  Negative for dysuria, flank pain and hematuria.  Musculoskeletal:  Positive for joint pain and myalgias.  Skin:  Negative for rash.  All other systems reviewed and are negative. Past Medical History:  Diagnosis Date   Hypertension    Thyroid disease    Past Surgical History:  Procedure Laterality Date   ABDOMINAL HYSTERECTOMY     ORIF FEMUR FRACTURE Right 11/30/2020   Procedure: OPEN REDUCTION INTERNAL FIXATION (ORIF) DISTAL FEMUR FRACTURE;  Surgeon: Myrene Galas, MD;  Location: [facility] OR;  Service: Orthopedics;  Laterality: Right;   ORIF HIP FRACTURE Left 09/02/2014   Procedure: OPEN  REDUCTION INTERNAL FIXATION LEFT HIP FRACTURE;  Surgeon: Darreld Mclean, MD;  Location: [facility] ORS;  Service: Orthopedics;  Laterality: Left;   Family History  Problem Relation Age of Onset   CAD Mother 22       Died of MI   Heart failure Father    CAD Father    Social History:  reports that she has never smoked. She has never used smokeless tobacco. She reports that she does not drink alcohol and does not use drugs. Allergies: No Known Allergies Medications Prior to Admission  Medication Sig Dispense Refill   acetaminophen (TYLENOL) 325 MG tablet Take 2 tablets (650 mg total) by mouth every 4 (four) hours as needed for mild pain (or temp > 37.5 C (99.5 F)). 20 tablet 0   amLODipine (NORVASC) 5 MG tablet Take 5 mg by mouth daily.     ASPIRIN ADULT LOW STRENGTH 81 MG EC tablet Take 81 mg by mouth daily.     atorvastatin (LIPITOR) 20 MG tablet Take 20 mg by mouth daily.     levothyroxine (SYNTHROID) 88 MCG tablet Take 88 mcg by mouth daily.     solifenacin (VESICARE) 5 MG tablet Take 5 mg by mouth daily.     Vitamin D, Ergocalciferol, (DRISDOL) 1.25 MG (50000 UNIT) CAPS capsule Take 50,000 Units by mouth once a week.     atorvastatin (LIPITOR) 40 MG tablet Take 1 tablet (40 mg total) daily at 6 PM by mouth. (Patient not taking: Reported on 11/29/2020) 30 tablet 0   famotidine (PEPCID) 20 MG tablet Take 1 tablet (20 mg total) 2 (two) times daily by mouth. (Patient not taking: No sig reported)  60 tablet 0    Drug Regimen Review Drug regimen was reviewed and remains appropriate with no significant issues identified  Home: Home Living Family/patient expects to be discharged to:: Private residence Living Arrangements: Alone Available Help at Discharge: Friend(s), Available PRN/intermittently Type of Home: Mobile home Home Access: Ramped entrance Home Layout: One level Bathroom Shower/Tub: Engineer, manufacturing systems: Standard Bathroom Accessibility: Yes Home Equipment: Grab bars -  tub/shower, Bedside commode, Walker - 2 wheels, Cane - single point Additional Comments: drives; has 30 outdoor cats she takes care of   Functional History: Prior Function Level of Independence: Independent with assistive device(s)  Functional Status:  Mobility: Bed Mobility Overal bed mobility: Needs Assistance Bed Mobility: Supine to Sit Supine to sit: HOB elevated, Min assist General bed mobility comments: HOB elevated, + rail; MinA for R LE management, otherwise able to scoot around with increased time and effort with max cues for technique/hand placement Transfers Overall transfer level: Needs assistance Equipment used: Rolling walker (2 wheeled) Transfers: Lateral/Scoot Transfers Sit to Stand: Mod assist, From elevated surface (ModAx3)  Lateral/Scoot Transfers: Max assist, +2 physical assistance General transfer comment: continues to require maxAx2 to perform lateral scoots into recliner, and continues to require max intervention to maintain WB precautions even with scooting. Very little recall of appropriate technique for lateral scooting manuever Ambulation/Gait General Gait Details: unable    ADL: ADL Overall ADL's : Needs assistance/impaired Eating/Feeding: Set up, Sitting Grooming: Set up, Sitting Upper Body Bathing: Minimal assistance, Sitting Lower Body Bathing: Maximal assistance, Sit to/from stand, +2 for physical assistance Upper Body Dressing : Minimal assistance, Sitting Lower Body Dressing: Maximal assistance, +2 for physical assistance, Sit to/from stand Toilet Transfer: Maximal assistance, +2 for physical assistance, +2 for safety/equipment, Transfer board Toilet Transfer Details (indicate cue type and reason): Max A +2 for lateral scoot to drop arm recliner. second person to elevate RLE Functional mobility during ADLs: Maximal assistance, +2 for physical assistance General ADL Comments: Pt presenting with decreased strength and  balance  Cognition: Cognition Overall Cognitive Status: Within Functional Limits for tasks assessed Orientation Level: Oriented X4 Cognition Arousal/Alertness: Awake/alert Behavior During Therapy: WFL for tasks assessed/performed Overall Cognitive Status: Within Functional Limits for tasks assessed General Comments: HOH, definite reduced insight into precautions as well as impaired STM. needed multimodal cues for maintaining NWB R LE  Physical Exam: Blood pressure (!) 113/51, pulse 88, temperature 98 F (36.7 C), temperature source Oral, resp. rate 18, height 5\' 4"  (1.626 m), weight 72.6 kg, SpO2 97 %. Physical Exam Constitutional:      Appearance: Normal appearance.  HENT:     Head: Normocephalic.     Right Ear: External ear normal.     Left Ear: External ear normal.     Nose: Nose normal.     Mouth/Throat:     Pharynx: Oropharynx is clear. No oropharyngeal exudate or posterior oropharyngeal erythema.  Eyes:     Extraocular Movements: Extraocular movements intact.     Pupils: Pupils are equal, round, and reactive to light.  Cardiovascular:     Rate and Rhythm: Normal rate.     Heart sounds: Murmur heard.     Comments: Occ PAC Pulmonary:     Effort: Pulmonary effort is normal. No respiratory distress.     Breath sounds: No wheezing.  Abdominal:     General: Bowel sounds are normal. There is no distension.     Tenderness: There is no abdominal tenderness.  Musculoskeletal:     Cervical back:  Normal range of motion.     Comments: Right thigh and knee swollen, bruised. Expectedly tender to palpation and attempts at PROM.  Skin:    Comments: Incision is dressed with scant blood beneath foam dressing.  Neurological:     Mental Status: She is alert and oriented to person, place, and time.     Comments: Patient is alert.  Good insight and awareness. Mild STM deficits.  No acute distress.  Makes eye contact with examiner.  Follows simple commands. HOH. Otherwise CN normal. No  focal sensory loss in limbs. Motor 4+/5 UE bilaterally. RLE 1/5 d/t pain at hip and knee and 4/5 ADF/PF. LLE 2+ to 3-/5 HF, 3/5 KE and 4/5 ADF/PF. DTR's 1+ no focal limb ataxia or tremor appreciated while supine in bed  Psychiatric:        Mood and Affect: Mood normal.        Behavior: Behavior normal.    Results for orders placed or performed during the hospital encounter of 11/29/20 (from the past 48 hour(s))  CBC     Status: Abnormal   Collection Time: 12/04/20  1:35 AM  Result Value Ref Range   WBC 8.2 4.0 - 10.5 K/uL   RBC 2.53 (L) 3.87 - 5.11 MIL/uL   Hemoglobin 8.0 (L) 12.0 - 15.0 g/dL   HCT 20.2 (L) 33.4 - 35.6 %   MCV 98.0 80.0 - 100.0 fL   MCH 31.6 26.0 - 34.0 pg   MCHC 32.3 30.0 - 36.0 g/dL   RDW 86.1 68.3 - 72.9 %   Platelets 286 150 - 400 K/uL   nRBC 0.0 0.0 - 0.2 %    Comment: Performed at [facility], [address], [place], [place] [address]   No results found.     Medical Problem List and Plan: 1.  Right distal femur fracture with intercondylar extension secondary to fall.  Status post ORIF 12/02/2020.  Nonweightbearing  -patient may shower  -ELOS/Goals: 17-21 days, supervision to min assist goals 2.  Antithrombotics: -DVT/anticoagulation: Check vascular study Pharmaceutical: Lovenox  -antiplatelet therapy: Aspirin 81 mg daily 3. Pain Management: Hydrocodone as needed 4. Mood: Provide emotional support  -antipsychotic agents: N/A 5. Neuropsych: This patient is capable of making decisions on her own behalf. 6. Skin/Wound Care: Routine skin checks 7. Fluids/Electrolytes/Nutrition: Routine in and outs with follow-up chemistries 8.  Acute blood loss anemia.  Follow-up CBC 9.  Hyperlipidemia.  Lipitor 10.  Hypertension.  Norvasc.  Monitor with increased mobility 11.  Hypothyroidism.  Synthroid 12.  History of CVA with resulting gait abnormality.  Continue aspirin   Charlton Amor, PA-C 12/05/2020

## 2020-12-06 ENCOUNTER — Inpatient Hospital Stay (HOSPITAL_COMMUNITY): Payer: Medicare Other

## 2020-12-06 DIAGNOSIS — S728X1A Other fracture of right femur, initial encounter for closed fracture: Secondary | ICD-10-CM | POA: Diagnosis not present

## 2020-12-06 DIAGNOSIS — M7989 Other specified soft tissue disorders: Secondary | ICD-10-CM

## 2020-12-06 LAB — CBC WITH DIFFERENTIAL/PLATELET
Abs Immature Granulocytes: 0.07 10*3/uL (ref 0.00–0.07)
Basophils Absolute: 0 10*3/uL (ref 0.0–0.1)
Basophils Relative: 0 %
Eosinophils Absolute: 0 10*3/uL (ref 0.0–0.5)
Eosinophils Relative: 0 %
HCT: 25.3 % — ABNORMAL LOW (ref 36.0–46.0)
Hemoglobin: 8.2 g/dL — ABNORMAL LOW (ref 12.0–15.0)
Immature Granulocytes: 1 %
Lymphocytes Relative: 26 %
Lymphs Abs: 2.7 10*3/uL (ref 0.7–4.0)
MCH: 31.7 pg (ref 26.0–34.0)
MCHC: 32.4 g/dL (ref 30.0–36.0)
MCV: 97.7 fL (ref 80.0–100.0)
Monocytes Absolute: 1 10*3/uL (ref 0.1–1.0)
Monocytes Relative: 10 %
Neutro Abs: 6.6 10*3/uL (ref 1.7–7.7)
Neutrophils Relative %: 63 %
Platelets: 345 10*3/uL (ref 150–400)
RBC: 2.59 MIL/uL — ABNORMAL LOW (ref 3.87–5.11)
RDW: 13.9 % (ref 11.5–15.5)
WBC: 10.4 10*3/uL (ref 4.0–10.5)
nRBC: 0 % (ref 0.0–0.2)

## 2020-12-06 LAB — COMPREHENSIVE METABOLIC PANEL
ALT: 19 U/L (ref 0–44)
AST: 38 U/L (ref 15–41)
Albumin: 2.4 g/dL — ABNORMAL LOW (ref 3.5–5.0)
Alkaline Phosphatase: 56 U/L (ref 38–126)
Anion gap: 6 (ref 5–15)
BUN: 23 mg/dL (ref 8–23)
CO2: 27 mmol/L (ref 22–32)
Calcium: 8.6 mg/dL — ABNORMAL LOW (ref 8.9–10.3)
Chloride: 105 mmol/L (ref 98–111)
Creatinine, Ser: 0.83 mg/dL (ref 0.44–1.00)
GFR, Estimated: >60 mL/min (ref >60)
Glucose, Bld: 115 mg/dL — ABNORMAL HIGH (ref 70–99)
Potassium: 4.5 mmol/L (ref 3.5–5.1)
Sodium: 138 mmol/L (ref 135–145)
Total Bilirubin: 2 mg/dL — ABNORMAL HIGH (ref 0.3–1.2)
Total Protein: 5.3 g/dL — ABNORMAL LOW (ref 6.5–8.1)

## 2020-12-06 MED ORDER — AMLODIPINE BESYLATE 2.5 MG PO TABS
2.5000 mg | ORAL_TABLET | Freq: Every day | ORAL | Status: DC
  Administered 2020-12-07: 2.5 mg via ORAL
  Filled 2020-12-06: qty 1

## 2020-12-06 MED ORDER — DICLOFENAC SODIUM 1 % EX GEL
2.0000 g | Freq: Four times a day (QID) | CUTANEOUS | Status: DC
  Administered 2020-12-06 – 2020-12-28 (×83): 2 g via TOPICAL
  Filled 2020-12-06 (×2): qty 100

## 2020-12-06 NOTE — Evaluation (Signed)
Occupational Therapy Assessment and Plan  Patient Details  Name: Andrea [name] MRN: [id number] Date of Birth: 05/13/1925  OT Diagnosis: abnormal posture, acute pain, cognitive deficits, muscle weakness (generalized), and pain in joint Rehab Potential: Rehab Potential (ACUTE ONLY): Fair ELOS: 21-24 days   Today's Date: 12/06/2020 OT Individual Time: 2831-5176 OT Individual Time Calculation (min): 58 min     Hospital Problem: Principal Problem:   Femur fracture ([place]) Active Problems:   Pressure injury of skin   Past Medical History:  Past Medical History:  Diagnosis Date   Hypertension    Thyroid disease    Past Surgical History:  Past Surgical History:  Procedure Laterality Date   ABDOMINAL HYSTERECTOMY     ORIF FEMUR FRACTURE Right 11/30/2020   Procedure: OPEN REDUCTION INTERNAL FIXATION (ORIF) DISTAL FEMUR FRACTURE;  Surgeon: Altamese [place], MD;  Location: [place];  Service: Orthopedics;  Laterality: Right;   ORIF HIP FRACTURE Left 09/02/2014   Procedure: OPEN REDUCTION INTERNAL FIXATION LEFT HIP FRACTURE;  Surgeon: Sanjuana Kava, MD;  Location: [facility];  Service: Orthopedics;  Laterality: Left;    Assessment & Plan Clinical Impression: Patient is a 87 y.o. right-handed female with 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  Per chart review lives alone.  Mobile home ramped entrance.  Independent with assistive device.  Presented 11/29/2020 after mechanical fall while feeding her cats without loss of consciousness.  Cranial CT scan CT cervical spine negative.  Sustained right distal femur fracture with intercondylar extension.  Underwent ORIF 12/02/2020 per Dr. Marcelino Scot.  Nonweightbearing right lower extremity.  Acute blood loss anemia 8.0 and monitored.  Placed on Lovenox for DVT prophylaxis.  Therapy evaluations completed due to patient decreased functional mobility was admitted for  a comprehensive rehab program. Patient transferred to CIR on 12/05/2020 .    Patient currently requires  max-total assist  with basic self-care skills secondary to muscle weakness and muscle joint tightness, decreased cardiorespiratoy endurance, decreased memory, and decreased sitting balance, decreased standing balance, decreased balance strategies, and difficulty maintaining precautions.  Prior to hospitalization, patient could complete ADLs and IADLs with modified independent .  Patient will benefit from skilled intervention to decrease level of assist with basic self-care skills and increase independence with basic self-care skills prior to discharge home independently.  Anticipate patient will require intermittent supervision and follow up home health.  OT - End of Session Activity Tolerance: Tolerates 30+ min activity with multiple rests Endurance Deficit: Yes Endurance Deficit Description: required rest breaks throughout OT Assessment Rehab Potential (ACUTE ONLY): Fair OT Barriers to Discharge: Decreased caregiver support;Home environment access/layout;Incontinence;Lack of/limited family support;Weight bearing restrictions OT Patient demonstrates impairments in the following area(s): Balance;Cognition;Endurance;Motor;Pain;Safety OT Basic ADL's Functional Problem(s): Grooming;Bathing;Dressing;Toileting OT Advanced ADL's Functional Problem(s): Simple Meal Preparation OT Transfers Functional Problem(s): Toilet;Tub/Shower OT Additional Impairment(s): None OT Plan OT Intensity: Minimum of 1-2 x/day, 45 to 90 minutes OT Frequency: 5 out of 7 days OT Duration/Estimated Length of Stay: 21-24 days OT Treatment/Interventions: Balance/vestibular training;Cognitive remediation/compensation;Discharge planning;Disease mangement/prevention;DME/adaptive equipment instruction;Functional mobility training;Pain management;Patient/family education;Psychosocial support;Self Care/advanced ADL retraining;Skin  care/wound managment;Therapeutic Activities;Therapeutic Exercise;UE/LE Strength taining/ROM;UE/LE Coordination activities OT Basic Self-Care Anticipated Outcome(s): Min assist OT Toileting Anticipated Outcome(s): Min assist OT Bathroom Transfers Anticipated Outcome(s): Min assist OT Recommendation Patient destination: Home Follow Up Recommendations: Skilled nursing facility Equipment Recommended: Tub/shower bench   OT Evaluation Precautions/Restrictions  Precautions Precautions: Fall Restrictions Weight Bearing Restrictions: Yes RLE Weight Bearing: Non weight  bearing Pain Pain Assessment Faces Pain Scale: Hurts even more Pain Type: Surgical pain Pain Location: Hip Pain Orientation: Right Pain Descriptors / Indicators: Aching Pain Onset: With Activity Home Living/Prior Functioning Home Living Available Help at Discharge: Friend(s), Available PRN/intermittently Type of Home: Mobile home Home Access: Ramped entrance Home Layout: One level Bathroom Shower/Tub: Government social research officer Accessibility: Yes Additional Comments: drives; has 30 outdoor cats she takes care of  Lives With: Alone IADL History Homemaking Responsibilities: Yes Meal Prep Responsibility: Primary Laundry Responsibility: Primary Cleaning Responsibility: Primary Homemaking Comments: recently hired someone to take over the mowing Prior Function Level of Independence: Independent with homemaking with ambulation, Independent with basic ADLs, Requires assistive device for independence Driving: Yes Comments: Still taking care of all household tasks, with exception of hiring someone in last 1-2 years to [place] her yard Vision Baseline Vision/History: 0 No visual deficits Ability to See in Adequate Light: 0 Adequate Patient Visual Report: No change from baseline Vision Assessment?: No apparent visual deficits Perception  Perception: Within Functional Limits Praxis Praxis:  Intact Cognition Overall Cognitive Status: Impaired/Different from baseline Arousal/Alertness: Awake/alert Orientation Level: Person;Place;Situation Person: Oriented Place: Oriented Situation: Oriented Year:  (1993) Month: August Day of Week: Incorrect (Tuesday) Memory: Impaired Immediate Memory Recall: Sock;Blue;Bed Memory Recall Sock: Not able to recall Memory Recall Blue: Not able to recall Memory Recall Bed: Not able to recall Attention: Selective Selective Attention: Appears intact Awareness: Appears intact Problem Solving: Impaired Safety/Judgment: Appears intact Sensation Sensation Light Touch: Impaired by gross assessment Proprioception: Impaired by gross assessment Additional Comments: decreased sensation along R great toe and mild difficulty determining L vs R Coordination Gross Motor Movements are Fluid and Coordinated: No Fine Motor Movements are Fluid and Coordinated: No Coordination and Movement Description: grossly uncoordinated due to pain, decreased balance, generalized weakness/deconditioning, and difficutly adhering to RLE NWB precautions Finger Nose Finger Test: [facility] on RUE, slower with dysmetria on LUE due to previous CVA Heel Shin Test: unable to lift RLE and decreased strength and ROM on LLE Motor  Motor Motor: Abnormal postural alignment and control;Hemiplegia Motor - Skilled Clinical Observations: grossly uncoordinated due to prior CVA, pain, decreased balance, generalized weakness/deconditioning, and difficutly adhering to RLE NWB precautions  Trunk/Postural Assessment  Cervical Assessment Cervical Assessment: Exceptions to [facility] (forward head) Thoracic Assessment Thoracic Assessment: Exceptions to [facility] (rounded shoulders) Lumbar Assessment Lumbar Assessment: Exceptions to [facility] (posterior pelvic tilt in sitting) Postural Control Postural Control: Deficits on evaluation Trunk Control: posterior lean sitting EOB  Balance Balance Balance Assessed:  Yes Static Sitting Balance Static Sitting - Balance Support: Feet unsupported;Bilateral upper extremity supported Static Sitting - Level of Assistance: 3: Mod assist Dynamic Sitting Balance Dynamic Sitting - Balance Support: Feet supported;Bilateral upper extremity supported Dynamic Sitting - Level of Assistance: 4: Min assist Extremity/Trunk Assessment RUE Assessment RUE Assessment: Within Functional Limits General Strength Comments: grossly 4+/5 LUE Assessment LUE Assessment: Exceptions to [facility] Active Range of Motion (AROM) Comments: WNL active ROM in gravity eliminated General Strength Comments: strength grossly 3+/5 and weak grasp  Care Tool Care Tool Self Care Eating        Oral Care    Oral Care Assist Level: Set up assist    Bathing   Body parts bathed by patient: Right arm;Left arm;Chest;Abdomen;Face Body parts bathed by helper: Front perineal area;Buttocks;Right upper leg;Left upper leg;Right lower leg;Left lower leg   Assist Level: 2 Helpers    Upper Body Dressing(including orthotics)   What is the patient wearing?: Hospital gown  only   Assist Level: Moderate Assistance - Patient 50 - 74%    Lower Body Dressing (excluding footwear)   What is the patient wearing?: Incontinence brief Assist for lower body dressing: 2 Helpers    Putting on/Taking off footwear   What is the patient wearing?: Non-skid slipper socks Assist for footwear: Total Assistance - Patient < 25%       Care Tool Toileting Toileting activity   Assist for toileting: 2 Helpers     Care Tool Bed Mobility Roll left and right activity   Roll left and right assist level: Minimal Assistance - Patient > 75%    Sit to lying activity   Sit to lying assist level: 2 Helpers    Lying to sitting on side of bed activity   Lying to sitting on side of bed assist level: the ability to move from lying on the back to sitting on the side of the bed with no back support.: Maximal Assistance - Patient 25 -  49%     Care Tool Transfers Sit to stand transfer Sit to stand activity did not occur: Safety/medical concerns      Chair/bed transfer   Chair/bed transfer assist level: 2 Armed forces training and education officer transfer activity did not occur: Safety/medical concerns       Care Tool Cognition  Expression of Ideas and Wants Expression of Ideas and Wants: 3. Some difficulty - exhibits some difficulty with expressing needs and ideas (e.g, some words or finishing thoughts) or speech is not clear  Understanding Verbal and Non-Verbal Content Understanding Verbal and Non-Verbal Content: 3. Usually understands - understands most conversations, but misses some part/intent of message. Requires cues at times to understand   Memory/Recall Ability Memory/Recall Ability : That he or she is in a hospital/hospital unit   Refer to Care Plan for Long Term Goals  SHORT TERM GOAL WEEK 1 OT Short Term Goal 1 (Week 1): Pt will complete bathing with mod assist OT Short Term Goal 2 (Week 1): Pt will complete LB dressing with max assist and AE as needed OT Short Term Goal 3 (Week 1): Pt will complete toilet transfer with max assist of one caregiver OT Short Term Goal 4 (Week 1): Pt will complete toileting with max assist  Recommendations for other services: Therapeutic Recreation  Pet therapy, Kitchen group, and Stress management   Skilled Therapeutic Intervention OT eval completed with discussion of rehab process, OT purpose, POC, ELOS, and goals.  ADL assessment completed with focus on rolling, sitting balance, and attempts at sit > stand.  Pt received supine in bed requiring mod assist to roll to L and mod-max assist to come to sitting EOB.  Pt required CGA-mod assist for sitting balance due to posterior lean.  Engaged in [place] bathing and dressing at [place] with mod assist for donning gown due to decreased problem solving and awareness.  Attempted sit > stand with +2 and use of RW, pt unable to complete due to poor  motor planning, anterior weight shift, and NWB through RLE.  Engaged in lateral scoots along EOB with max-total assist.  Pt returned to supine with +2 to lift BLE and control descent of trunk. Completed LB bathing and dressing at bed level rolling R and L with cues for sequencing and motor planning.  Pt remained semi-reclined in bed with all needs in reach.  ADL ADL Upper Body Bathing: Minimal assistance Where Assessed-Upper Body Bathing: Edge of bed Lower Body Bathing: Dependent (+  2) Where Assessed-Lower Body Bathing: Bed level Upper Body Dressing: Moderate assistance Where Assessed-Upper Body Dressing: Edge of bed Lower Body Dressing: Dependent (+2) Where Assessed-Lower Body Dressing: Bed level Toileting: Dependent (+2) Where Assessed-Toileting: Bed level Toilet Transfer: Unable to assess Mobility  Bed Mobility Bed Mobility: Rolling Right;Rolling Left;Supine to Sit;Sitting - Scoot to [employer] of Bed Rolling Right: Minimal Assistance - Patient > 75% Rolling Left: Minimal Assistance - Patient > 75% Left Sidelying to Sit: Moderate Assistance - Patient 50-74% Supine to Sit: Total Assistance - Patient < 25% Sitting - Scoot to Edge of Bed: Maximal Assistance - Patient 25-49%   Discharge Criteria: Patient will be discharged from OT if patient refuses treatment 3 consecutive times without medical reason, if treatment goals not met, if there is a change in medical status, if patient makes no progress towards goals or if patient is discharged from hospital.  The above assessment, treatment plan, treatment alternatives and goals were discussed and mutually agreed upon: by patient  Simonne Come 12/06/2020, 12:39 PM

## 2020-12-06 NOTE — Progress Notes (Signed)
PROGRESS NOTE   Subjective/Complaints: No complaints this morning  Denies insomnia, constipation Has some right knee pain- discussed trialing voltaren gel 4 times per day   Objective:   No results found. Recent Labs    12/04/20 0135 12/06/20 0521  WBC 8.2 10.4  HGB 8.0* 8.2*  HCT 24.8* 25.3*  PLT 286 345   Recent Labs    12/06/20 0521  NA 138  K 4.5  CL 105  CO2 27  GLUCOSE 115*  BUN 23  CREATININE 0.83  CALCIUM 8.6*    Intake/Output Summary (Last 24 hours) at 12/06/2020 8119 Last data filed at 12/05/2020 1809 Gross per 24 hour  Intake 177 ml  Output --  Net 177 ml     Pressure Injury 12/05/20 Buttocks Left Stage 2 -  Partial thickness loss of dermis presenting as a shallow open injury with a red, pink wound bed without slough. A stage 2 measuring 1cmx1cm and surrounding skin is nonblaching. entire area measures 8.5cmx (Active)  12/05/20 1700  Location: Buttocks  Location Orientation: Left  Staging: Stage 2 -  Partial thickness loss of dermis presenting as a shallow open injury with a red, pink wound bed without slough.  Wound Description (Comments): A stage 2 measuring 1cmx1cm and surrounding skin is nonblaching. entire area measures 8.5cmx3.7cm.  Present on Admission: Yes    Physical Exam: Vital Signs Blood pressure (!) 113/52, pulse 73, temperature 98.1 F (36.7 C), temperature source Oral, resp. rate 18, height 5\' 4"  (1.626 m), weight 71.3 kg, SpO2 96 %. Gen: no distress, normal appearing HEENT: oral mucosa pink and moist, NCAT Cardio: Reg rate Chest: normal effort, normal rate of breathing Abd: soft, non-distended Musculoskeletal:     Cervical back: Normal range of motion.     Comments: Right thigh and knee swollen, bruised. Expectedly tender to palpation and attempts at PROM.  Skin:    Comments: Incision is dressed with scant blood beneath foam dressing. Left buttock wound Neurological:      Mental Status: She is alert and oriented to person, place, and time.     Comments: Patient is alert.  Good insight and awareness. Mild STM deficits.  No acute distress.  Makes eye contact with examiner.  Follows simple commands. HOH. Otherwise CN normal. No focal sensory loss in limbs. Motor 4+/5 UE bilaterally. RLE 1/5 d/t pain at hip and knee and 4/5 ADF/PF. LLE 2+ to 3-/5 HF, 3/5 KE and 4/5 ADF/PF. DTR's 1+ no focal limb ataxia or tremor appreciated while supine in bed  Psychiatric:        Mood and Affect: Mood normal.        Behavior: Behavior normal.     Assessment/Plan: 1. Functional deficits which require 3+ hours per day of interdisciplinary therapy in a comprehensive inpatient rehab setting. Physiatrist is providing close team supervision and 24 hour management of active medical problems listed below. Physiatrist and rehab team continue to assess barriers to discharge/monitor patient progress toward functional and medical goals  Care Tool:  Bathing              Bathing assist       Upper Body Dressing/Undressing Upper body dressing  Upper body assist Assist Level: Minimal Assistance - Patient > 75%    Lower Body Dressing/Undressing Lower body dressing            Lower body assist       Toileting Toileting    Toileting assist Assist for toileting: 2 Helpers     Transfers Chair/bed transfer  Transfers assist           Locomotion Ambulation   Ambulation assist              Walk 10 feet activity   Assist           Walk 50 feet activity   Assist           Walk 150 feet activity   Assist           Walk 10 feet on uneven surface  activity   Assist           Wheelchair     Assist               Wheelchair 50 feet with 2 turns activity    Assist            Wheelchair 150 feet activity     Assist          Blood pressure (!) 113/52, pulse 73, temperature 98.1 F (36.7 C),  temperature source Oral, resp. rate 18, height 5\' 4"  (1.626 m), weight 71.3 kg, SpO2 96 %.  Medical Problem List and Plan: 1.  Right distal femur fracture with intercondylar extension secondary to fall.  Status post ORIF 12/02/2020.  Nonweightbearing             -patient may shower             -ELOS/Goals: 17-21 days, supervision to min assist goals  -Initial CIR evals today 2.  Antithrombotics: -DVT/anticoagulation: Check vascular study Pharmaceutical: Lovenox             -antiplatelet therapy: Aspirin 81 mg daily 3. Femur fracture/right knee pain: Hydrocodone as needed. Voltaren gel 2mg  QID  4. Mood: Provide emotional support             -antipsychotic agents: N/A 5. Neuropsych: This patient is capable of making decisions on her own behalf. 6. Skin/Wound Care: Routine skin checks. Local wound care 7. Fluids/Electrolytes/Nutrition: Routine in and outs with follow-up chemistries 8.  Acute blood loss anemia.  Hgb stable- continue to monitor weekly 9.  Hyperlipidemia.  Lipitor 10.  Hypertension.  Norvasc- decrease to 2.5mg . Monitor with increased mobility 11.  Hypothyroidism.  Synthroid 12.  History of CVA with resulting gait abnormality.  Continue aspirin  LOS: 1 days A FACE TO FACE EVALUATION WAS PERFORMED  02/01/2021 Andrea [name] 12/06/2020, 9:27 AM

## 2020-12-06 NOTE — Progress Notes (Signed)
Inpatient Rehabilitation Care Coordinator Assessment and Plan Patient Details  Name: Andrea [name] MRN: [id number] Date of Birth: 12-08-1925  Today's Date: 12/06/2020  Hospital Problems: Principal Problem:   Femur fracture ([facility]) Active Problems:   Pressure injury of skin  Past Medical History:  Past Medical History:  Diagnosis Date   Hypertension    Thyroid disease    Past Surgical History:  Past Surgical History:  Procedure Laterality Date   ABDOMINAL HYSTERECTOMY     ORIF FEMUR FRACTURE Right 11/30/2020   Procedure: OPEN REDUCTION INTERNAL FIXATION (ORIF) DISTAL FEMUR FRACTURE;  Surgeon: Myrene Galas, MD;  Location: [facility];  Service: Orthopedics;  Laterality: Right;   ORIF HIP FRACTURE Left 09/02/2014   Procedure: OPEN REDUCTION INTERNAL FIXATION LEFT HIP FRACTURE;  Surgeon: Darreld Mclean, MD;  Location: [facility];  Service: Orthopedics;  Laterality: Left;   Social History:  reports that she has never smoked. She has never used smokeless tobacco. She reports that she does not drink alcohol and does not use drugs.  Family / Support Systems Marital Status: Widow/Widower Patient Roles: Other (Comment) (friends and nieces) Other Supports: Jina-niece [phone]  Tommy-friend [phone]  Theresa-friend plans to get number Anticipated Caregiver: Friends Ability/Limitations of Caregiver: Friends will rally together to assist. Unsure if can provide 24/7 care Caregiver Availability: Intermittent Family Dynamics: Close with friends and niece. She is very independent and has always taken care of herself. She has outlived both of her children but her friends are her family  Social History Preferred language: English Religion:  Cultural Background: No issues Education: HS Health Literacy - How often do you need to have someone help you when you read instructions, pamphlets, or other written material from your doctor or pharmacy?: Sometimes Writes: Yes Employment Status: Retired Market researcher Issues: No issues Guardian/Conservator: None-according to MD pt is capable of making her own decisons while here   Abuse/Neglect Abuse/Neglect Assessment Can Be Completed: Yes Physical Abuse: Denies Verbal Abuse: Denies Sexual Abuse: Denies Exploitation of patient/patient's resources: Denies Self-Neglect: Denies  Patient response to: Social Isolation - How often do you feel lonely or isolated from those around you?: Never  Emotional Status Pt's affect, behavior and adjustment status: Pt is very independent and involved with her friends and cats she cares for. She is involved with RH down from her. She is very giving and tries to help others. She realizes now she may need some help. Recent Psychosocial Issues: other health issues Psychiatric History: No history deferred depression screen due to coping appropriately and verbalizes her concerns and feelings. Very upbeat and future oriented Substance Abuse History: No issues  Patient / Family Perceptions, Expectations & Goals Pt/Family understanding of illness & functional limitations: Pt is able to explain her femur fracture and does talk with the MD and feels has a good understanding. She is aware of her NWB and finds it difficult to follow due to has had hip fracture in 2016 and it was able to put leg on the floor. She will do her best Premorbid pt/family roles/activities: Retiree, home owner, friend, caregiver of cats-outside Anticipated changes in roles/activities/participation: resume Pt/family expectations/goals: Pt states: " I know I will need help but my friends can help and if not I will go somewhere until I can."  [employer]: None Premorbid Home Care/DME Agencies: Other (Comment) (has cane, rw, bsc from previous hip fracture) Transportation available at discharge: Self now will rely upon friends Is the patient able to respond to transportation needs?: Yes  In the past 12 months,  has lack of transportation kept you from medical appointments or from getting medications?: No In the past 12 months, has lack of transportation kept you from meetings, work, or from getting things needed for daily living?: No  Discharge Planning Living Arrangements: Alone Support Systems: Other relatives, Friends/neighbors Type of Residence: Private residence Insurance Resources: [employer], [employer] (specify county) Surveyor, quantity Resources: Restaurant manager, fast food Screen Referred: No Living Expenses: Own Money Management: Patient Does the patient have any problems obtaining your medications?: No Home Management: self-friends to help until able to resume Patient/Family Preliminary Plans: Pt hopes to go home with friends assisting her, all will depend upon her progress while here. She knows her friends will help but not sure can provide 24/7. Discussed pt may need to go to a SNF for a short time until can WB and do more for herself. Will await therapy evaluations and work on safe plan for her. Care Coordinator Barriers to Discharge: Lack of/limited family support, Weight bearing restrictions Care Coordinator Anticipated Follow Up Needs: HH/OP, SNF  Clinical Impression Pleasant quite talkative patient who is motivated to do well here and regain her independence. She is very active still at 87 yo and has a good support system of friends who look out for each other. She feels one or two may stay with her at discharge. Her children have passed away her only family is nieces. Will work on a safe discharge plan, await therapy evaluations.  Lucy Chris 12/06/2020, 9:43 AM

## 2020-12-06 NOTE — Progress Notes (Signed)
Inpatient Rehabilitation Center Individual Statement of Services  Patient Name:  Andrea [name]  Date:  12/06/2020  Welcome to the Inpatient Rehabilitation Center.  Our goal is to provide you with an individualized program based on your diagnosis and situation, designed to meet your specific needs.  With this comprehensive rehabilitation program, you will be expected to participate in at least 3 hours of rehabilitation therapies Monday-Friday, with modified therapy programming on the weekends.  Your rehabilitation program will include the following services:  Physical Therapy (PT), Occupational Therapy (OT), 24 hour per day rehabilitation nursing, Care Coordinator, Rehabilitation Medicine, Nutrition Services, and Pharmacy Services  Weekly team conferences will be held on Tuesday to discuss your progress.  Your Inpatient Rehabilitation Care Coordinator will talk with you frequently to get your input and to update you on team discussions.  Team conferences with you and your family in attendance may also be held.  Expected length of stay: 21-24 days  Overall anticipated outcome: min assist level  Depending on your progress and recovery, your program may change. Your Inpatient Rehabilitation Care Coordinator will coordinate services and will keep you informed of any changes. Your Inpatient Rehabilitation Care Coordinator's name and contact numbers are listed  below.  The following services may also be recommended but are not provided by the Inpatient Rehabilitation Center:  Driving Evaluations Home Health Rehabiltiation Services Outpatient Rehabilitation Services    Arrangements will be made to provide these services after discharge if needed.  Arrangements include referral to agencies that provide these services.  Your insurance has been verified to be:  Medicare & medicaid Your primary doctor is:  [facility]  Pertinent information will be shared with your doctor and your insurance  company.  Inpatient Rehabilitation Care Coordinator:  Dossie Der, [place] [phone] or Luna Glasgow  Information discussed with and copy given to patient by: Lucy Chris, 12/06/2020, 9:47 AM

## 2020-12-06 NOTE — Progress Notes (Signed)
BLE venous duplex has been completed.  Results can be found under chart review under CV PROC. 12/06/2020 4:27 PM Delson Dulworth RVT, RDMS

## 2020-12-06 NOTE — Progress Notes (Signed)
Physical Therapy Session Note  Patient Details  Name: Andrea [name] MRN: [id number] Date of Birth: [birth year]  Today's Date: 12/06/2020 PT Individual Time: 4098-1191 PT Individual Time Calculation (min): 56 min   Short Term Goals: Week 1:  PT Short Term Goal 1 (Week 1): pt will transfer supine<>sitting EOB with min A PT Short Term Goal 2 (Week 1): pt will transfer bed<>chair with LRAD and max of 1 PT Short Term Goal 3 (Week 1): pt will transfer sit<>stand with LRAD and max of 1  Skilled Therapeutic Interventions/Progress Updates:   Received pt semi-reclined in bed with case manager present at bedside, pt agreeable to PT treatment, and denied any pain at rest but reports increased pain with any mobility. Repositioning, rest breaks, and distraction done to reduce pain levels. Session with emphasis on functional mobility/transfers, generalized strengthening, and improved activity tolerance. Pt able to recall location but needed cues to recall situation, month, and year. Provided pt with appropriate height RW and pt transferred supine<>sitting EOB with HOB elevated and mod A with cues for technique and assist to manage RLE. Scooted to EOB with CGA and increased time but unable to scoot hips back on bed requiring max A. Attempted to stand from elevated bed with max/total A of 1 using RW, but pt ultimately unable. Pt able to stand x 2 trials with RW from elevated bed and max A +2 while maintaining RLE NWB precautions with therapist's foot underneath pt's RLE. Pt required cues for upright posture/gaze and hip/trunk extension but difficulty following cues and only able to remain standing for a few seconds. Sit<>supine with max A for BLE management and scooted to [facility] with max A of 1 and use of Trendelenburg bed position with pt pulling on headboard to assist. Rolled L/R with min A and use of bedrails and doffed pants with max A. Smelled odor and checked; pt found to be incontinent of bowel. Performed peri-care in  sidelying with total A and donned clean brief with max A. Scooted to [facility] again in same manner. Offered ice pack for pain relief but pt politely declined. Concluded session with pt semi-reclined in bed, needs within reach, and bed alarm on.   Therapy Documentation Precautions:  Restrictions Weight Bearing Restrictions: Yes RLE Weight Bearing: Non weight bearing  Therapy/Group: Individual Therapy Martin Majestic PT, DPT   12/06/2020, 7:25 AM

## 2020-12-06 NOTE — Evaluation (Signed)
Physical Therapy Assessment and Plan  Patient Details  Name: Nicosha Struve MRN: [id number] Date of Birth: 10-04-1925  PT Diagnosis: Abnormal posture, Abnormality of gait, Cognitive deficits, Difficulty walking, Hemiparesis non-dominant, Impaired sensation, Muscle weakness, and Pain in RLE Rehab Potential: Good ELOS: 21-24 days   Today's Date: 12/06/2020 PT Individual Time: 1751-0258 PT Individual Time Calculation (min): 54 min    Hospital Problem: Principal Problem:   Femur fracture ([place]) Active Problems:   Pressure injury of skin   Past Medical History:  Past Medical History:  Diagnosis Date   Hypertension    Thyroid disease    Past Surgical History:  Past Surgical History:  Procedure Laterality Date   ABDOMINAL HYSTERECTOMY     ORIF FEMUR FRACTURE Right 11/30/2020   Procedure: OPEN REDUCTION INTERNAL FIXATION (ORIF) DISTAL FEMUR FRACTURE;  Surgeon: Altamese [place], MD;  Location: [place];  Service: Orthopedics;  Laterality: Right;   ORIF HIP FRACTURE Left 09/02/2014   Procedure: OPEN REDUCTION INTERNAL FIXATION LEFT HIP FRACTURE;  Surgeon: Sanjuana Kava, MD;  Location: [facility];  Service: Orthopedics;  Laterality: Left;    Assessment & Plan Clinical Impression: Patient is a 87 y.o. year old female with 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  Per chart review lives alone.  Mobile home ramped entrance.  Independent with assistive device.  Presented 11/29/2020 after mechanical fall while feeding her cats without loss of consciousness.  Cranial CT scan CT cervical spine negative.  Sustained right distal femur fracture with intercondylar extension.  Underwent ORIF 12/02/2020 per Dr. Marcelino Scot.  Nonweightbearing right lower extremity.  Acute blood loss anemia 8.0 and monitored.  Placed on Lovenox for DVT prophylaxis.  Therapy evaluations completed due to patient decreased functional  mobility was admitted for a comprehensive rehab program.  Patient currently requires total with mobility secondary to muscle weakness and muscle joint tightness, decreased cardiorespiratoy endurance, unbalanced muscle activation and decreased coordination, decreased problem solving and decreased memory, and decreased sitting balance, decreased standing balance, decreased postural control, hemiplegia, decreased balance strategies, and difficulty maintaining precautions.  Prior to hospitalization, patient was modified independent  with mobility and lived with Alone in a Mobile home home.  Home access is  Ramped entrance.  Patient will benefit from skilled PT intervention to maximize safe functional mobility, minimize fall risk, and decrease caregiver burden for planned discharge home with intermittent assist.  Anticipate patient will benefit from follow up [facility] at discharge.  PT - End of Session Activity Tolerance: Tolerates 30+ min activity with multiple rests Endurance Deficit: Yes Endurance Deficit Description: required rest breaks throughout PT Assessment Rehab Potential (ACUTE/IP ONLY): Good PT Barriers to Discharge: Decreased caregiver support;Wound Care;Lack of/limited family support;Weight bearing restrictions PT Barriers to Discharge Comments: RLE NWB status, pain, generalized weakness/deconditioning PT Patient demonstrates impairments in the following area(s): Balance;Endurance;Motor;Pain;Sensory;Skin Integrity PT Transfers Functional Problem(s): Bed Mobility;Bed to Chair;Furniture;Car PT Locomotion Functional Problem(s): Ambulation;Wheelchair Mobility;Stairs PT Plan PT Intensity: Minimum of 1-2 x/day ,45 to 90 minutes PT Frequency: 5 out of 7 days PT Duration Estimated Length of Stay: 21-24 days PT Treatment/Interventions: Ambulation/gait training;Discharge planning;Functional mobility training;Psychosocial support;Therapeutic Activities;Visual/perceptual  remediation/compensation;Balance/vestibular training;Disease management/prevention;Neuromuscular re-education;Skin care/wound management;Therapeutic Exercise;Wheelchair propulsion/positioning;Cognitive remediation/compensation;DME/adaptive equipment instruction;Pain management;Splinting/orthotics;UE/LE Strength taining/ROM;Community reintegration;Functional electrical stimulation;Patient/family education;Stair training;UE/LE Coordination activities PT Transfers Anticipated Outcome(s): supervision PT Locomotion Anticipated Outcome(s): min A with LRAD PT Recommendation Follow Up Recommendations: Home health PT Patient destination: Home Equipment Recommended: To be determined Equipment Details: has RW  PT Evaluation Precautions/Restrictions Precautions Precautions: Fall Restrictions Weight Bearing Restrictions: Yes RLE Weight Bearing: Non weight bearing Pain Interference Pain Interference Pain Effect on Sleep: 3. Frequently Pain Interference with Therapy Activities: 3. Frequently Pain Interference with Day-to-Day Activities: 3. Frequently Home Living/Prior Functioning Home Living Living Arrangements: Alone Available Help at Discharge: Friend(s);Available PRN/intermittently;Family Type of Home: Mobile home Home Access: Ramped entrance Home Layout: One level Bathroom Shower/Tub: Chiropodist: Standard Bathroom Accessibility: Yes Additional Comments: has outdoor cats she takes care of  Lives With: Alone Prior Function Level of Independence: Requires assistive device for independence  Able to Take Stairs?: Yes Driving: Yes Vocation: Retired Comments: Still taking care of all household tasks, with exception of hiring someone in last 1-2 years to [place] her yard Vision/Perception  Vision - History Ability to See in Adequate Light: 0 Adequate Perception Perception: Within Functional Limits Praxis Praxis: Intact  Cognition Overall Cognitive Status:  Impaired/Different from baseline Arousal/Alertness: Awake/alert Orientation Level: Oriented X4 Year:  (1993) Month: August Day of Week: Incorrect (Tuesday) Attention: Selective Selective Attention: Appears intact Memory: Impaired Immediate Memory Recall: Sock;Blue;Bed Memory Recall Sock: Not able to recall Memory Recall Blue: Not able to recall Memory Recall Bed: Not able to recall Awareness: Appears intact Problem Solving: Impaired Safety/Judgment: Appears intact Sensation Sensation Light Touch: Impaired by gross assessment Proprioception: Impaired by gross assessment Additional Comments: decreased sensation along R great toe and mild difficulty determining L vs R Coordination Gross Motor Movements are Fluid and Coordinated: No Fine Motor Movements are Fluid and Coordinated: No Coordination and Movement Description: grossly uncoordinated due to pain, decreased balance, generalized weakness/deconditioning, and difficutly adhering to RLE NWB precautions Finger Nose Finger Test: [facility] on RUE, slower with dysmetria on LUE due to previous CVA Heel Shin Test: unable to lift RLE and decreased strength and ROM on LLE Motor  Motor Motor: Abnormal postural alignment and control;Hemiplegia Motor - Skilled Clinical Observations: grossly uncoordinated due to prior CVA, pain, decreased balance, generalized weakness/deconditioning, and difficutly adhering to RLE NWB precautions  Trunk/Postural Assessment  Cervical Assessment Cervical Assessment: Exceptions to [facility] (forward head) Thoracic Assessment Thoracic Assessment: Exceptions to [facility] (rounded shoulders) Lumbar Assessment Lumbar Assessment: Exceptions to [facility] (posterior pelvic tilt in sitting) Postural Control Postural Control: Deficits on evaluation Trunk Control: posterior lean sitting EOB  Balance Balance Balance Assessed: Yes Static Sitting Balance Static Sitting - Balance Support: Feet unsupported;Bilateral upper extremity  supported Static Sitting - Level of Assistance: 3: Mod assist Dynamic Sitting Balance Dynamic Sitting - Balance Support: Feet supported;Bilateral upper extremity supported Dynamic Sitting - Level of Assistance: 4: Min assist Extremity Assessment  RLE Assessment RLE Assessment: Exceptions to [facility] General Strength Comments: not formally tested due to pain; grossly 2/5 LLE Assessment LLE Assessment: Exceptions to [facility] General Strength Comments: not formally tested; grossly 3/5  Care Tool Care Tool Bed Mobility Roll left and right activity   Roll left and right assist level: Minimal Assistance - Patient > 75%    Sit to lying activity        Lying to sitting on side of bed activity   Lying to sitting on side of bed assist level: the ability to move from lying on the back to sitting on the side of the bed with no back support.: Total Assistance - Patient < 25%     Care Tool Transfers Sit to stand transfer Sit to stand activity did not occur: Safety/medical concerns (pain, weakness, decreased balance, poor adherance to RLE NWB precautions)  Chair/bed transfer   Chair/bed transfer assist level: 2 Armed forces training and education officer transfer activity did not occur: Safety/medical concerns (pain, weakness, decreased balance, poor adherance to RLE NWB precautions)      Scientist, product/process development transfer activity did not occur: Safety/medical concerns (pain, weakness, decreased balance, poor adherance to RLE NWB precautions)        Care Tool Locomotion Ambulation Ambulation activity did not occur: Safety/medical concerns (pain, weakness, decreased balance, poor adherance to RLE NWB precautions)        Walk 10 feet activity Walk 10 feet activity did not occur: Safety/medical concerns (pain, weakness, decreased balance, poor adherance to RLE NWB precautions)       Walk 50 feet with 2 turns activity Walk 50 feet with 2 turns activity did not occur: Safety/medical concerns (pain, weakness,  decreased balance, poor adherance to RLE NWB precautions)      Walk 150 feet activity Walk 150 feet activity did not occur: Safety/medical concerns (pain, weakness, decreased balance, poor adherance to RLE NWB precautions)      Walk 10 feet on uneven surfaces activity Walk 10 feet on uneven surfaces activity did not occur: Safety/medical concerns (pain, weakness, decreased balance, poor adherance to RLE NWB precautions)      Stairs Stair activity did not occur: Safety/medical concerns (pain, weakness, decreased balance, poor adherance to RLE NWB precautions)        Walk up/down 1 step activity Walk up/down 1 step or curb (drop down) activity did not occur: Safety/medical concerns (pain, weakness, decreased balance, poor adherance to RLE NWB precautions)     Walk up/down 4 steps activity did not occuR: Safety/medical concerns (pain, weakness, decreased balance, poor adherance to RLE NWB precautions)  Walk up/down 4 steps activity      Walk up/down 12 steps activity Walk up/down 12 steps activity did not occur: Safety/medical concerns (pain, weakness, decreased balance, poor adherance to RLE NWB precautions)      Pick up small objects from floor Pick up small object from the floor (from standing position) activity did not occur: Safety/medical concerns (pain, weakness, decreased balance, poor adherance to RLE NWB precautions)      Wheelchair Is the patient using a wheelchair?: Yes Type of Wheelchair: Manual Wheelchair activity did not occur: Safety/medical concerns (pain, weakness, decreased balance, poor adherance to RLE NWB precautions)      Wheel 50 feet with 2 turns activity Wheelchair 50 feet with 2 turns activity did not occur: Safety/medical concerns (pain, weakness, decreased balance, poor adherance to RLE NWB precautions)    Wheel 150 feet activity Wheelchair 150 feet activity did not occur: Safety/medical concerns (pain, weakness, decreased balance, poor adherance to RLE NWB  precautions)      Refer to Care Plan for Long Term Goals  SHORT TERM GOAL WEEK 1 PT Short Term Goal 1 (Week 1): pt will transfer supine<>sitting EOB with min A PT Short Term Goal 2 (Week 1): pt will transfer bed<>chair with LRAD and max of 1 PT Short Term Goal 3 (Week 1): pt will transfer sit<>stand with LRAD and max of 1  Recommendations for other services: None   Skilled Therapeutic Intervention Evaluation completed (see details above and below) with education on PT POC and goals and individual treatment initiated with focus on functional mobility/transfers, generalized strengthening, dynamic standing balance/coordination, and improved endurance with activity. Received pt semi-reclined in bed asleep, pt easily aroused and educated on PT evaluation, CIR policies, and therapy schedule and agreeable. Pt  denied any pain at rest but reported increased in R hip pain with movement -RN notified at end of session to administer pain medication. Pt very pleasant but talkative requiring increased time with all mobility and frequent redirection. Provided pt with 16x16 WC with Roho cushion and slideboard. Checked to ensure brief was clean and donned pants in supine with max/total A and rolled L/R with min A and use of bedrails to pull pants over hips. Pt transferred supine<>sitting EOB with max/total A. Pt required mod A for sitting balance due to posterior lean and max A to scoot hips to EOB. Pt transferred bed<>WC via slideboard with max A +2 with therapist's foot underneath RLE to maintain NWB status, however still putting weight through LE. Pt screaming in discomfort but once in WC able to scoot hips back with min A and reported feeling better. Concluded session with pt sitting in WC, needs within reach, and chair pad alarm on. Safety plan updated and RN notified of pt's current status.   Mobility Bed Mobility Bed Mobility: Rolling Right;Rolling Left;Supine to Sit;Sitting - Scoot to [employer] of Bed Rolling  Right: Minimal Assistance - Patient > 75% Rolling Left: Minimal Assistance - Patient > 75% Left Sidelying to Sit: Moderate Assistance - Patient 50-74% Supine to Sit: Total Assistance - Patient < 25% Sitting - Scoot to Edge of Bed: Maximal Assistance - Patient 25-49% Transfers Transfers: Lateral/Scoot Transfers Lateral/Scoot Transfers: 2 Press photographer (Assistive device): Other (Comment) (slideboard) Locomotion  Gait Ambulation: No Gait Gait: No Stairs / Additional Locomotion Stairs: No Wheelchair Mobility Wheelchair Mobility: No   Discharge Criteria: Patient will be discharged from PT if patient refuses treatment 3 consecutive times without medical reason, if treatment goals not met, if there is a change in medical status, if patient makes no progress towards goals or if patient is discharged from hospital.  The above assessment, treatment plan, treatment alternatives and goals were discussed and mutually agreed upon: by patient  Alfonse Alpers PT, DPT  12/06/2020, 12:21 PM

## 2020-12-06 NOTE — Progress Notes (Signed)
Inpatient Rehabilitation  Patient information reviewed and entered into eRehab system by Damiya Sandefur Kaleth Koy, OTR/L.   Information including medical coding, functional ability and quality indicators will be reviewed and updated through discharge.    

## 2020-12-06 NOTE — Progress Notes (Signed)
Patient ID: Andrea Andrea [name], female   DOB: [birth year], 87 y.o.   MRN: [id number] Met with the patient to introduce self and role. Reviewed rehab schedule and plan of care. Discussed educational needs including education on lovenox injections. Anticipate 18 Andrea [name] ELOS and lovenox was ordered for 21 days preadmit to rehab; should be completed by the time of discharge. REviewed medications and dietary tips to increase vit D, Calcium and protein. Patient plans to go home solo with niece checking in on her. Barrier to discharge is NWB status post ORIF however, patient notes she is sure it will all work out. Continue to follow along to discharge to address educational needs and collaborate with the SW to facilitate preparation for discharge. Margarito Liner

## 2020-12-06 NOTE — Progress Notes (Signed)
Occupational Therapy Session Note  Patient Details  Name: Andrea [name] MRN: [id number] Date of Birth: [birth year]  Today's Date: 12/06/2020 OT Individual Time: 8341-9622 OT Individual Time Calculation (min): 46 min    Short Term Goals: Week 1:  OT Short Term Goal 1 (Week 1): Pt will complete bathing with mod assist OT Short Term Goal 2 (Week 1): Pt will complete LB dressing with max assist and AE as needed OT Short Term Goal 3 (Week 1): Pt will complete toilet transfer with max assist of one caregiver OT Short Term Goal 4 (Week 1): Pt will complete toileting with max assist  Skilled Therapeutic Interventions/Progress Updates:  Treatment session with focus on self-care retraining and functional transfers.  Pt received upright in w/c reporting "drunk" feeling due to fatigue and worn out from sitting upright.  Pt agreeable to completing grooming tasks from w/c level at sink prior to returning to bed.  Setup for oral care and brushing hair while seated at sink.  Educated on w/c parts management as pt may benefit from w/c at home due to NWB status in RLE.  Completed slide board transfer to L with max assist +2 and multimodal cues for anterior weight shift.  Pt demonstrating improved weight shifting and ability to maintain NWB through RLE during transfer.  Returned to supine +2 for BLE and trunk management.  Engaged in rolling R and L for clothing management as pants not fully over hips.  Therapist providing multimodal cues for sequencing and motor planning during rolling.  Therapist provided pt with visual reminder for use of hospital phone.  Pt remained semi-reclined in bed with all needs in reach.  Therapy Documentation Precautions:  Precautions Precautions: Fall Restrictions Weight Bearing Restrictions: Yes RLE Weight Bearing: Non weight bearing General:   Vital Signs: Therapy Vitals Temp: 98.7 F (37.1 C) Temp Source: Oral Pulse Rate: 71 Resp: 16 BP: 113/67 Patient Position (if  appropriate): Sitting Oxygen Therapy SpO2: 97 % O2 Device: Room Air Pain: Pt with no c/o pain   Therapy/Group: Individual Therapy  Rosalio Loud 12/06/2020, 2:39 PM

## 2020-12-06 NOTE — Discharge Instructions (Addendum)
Inpatient Rehab Discharge Instructions  Rechy Bost Discharge date and time: No discharge date for patient encounter.   Activities/Precautions/ Functional Status: Activity: Nonweightbearing right lower extremity Diet: regular diet Wound Care: Routine skin checks Functional status:  ___ No restrictions     ___ Walk up steps independently ___ 24/7 supervision/assistance   ___ Walk up steps with assistance ___ Intermittent supervision/assistance  ___ Bathe/dress independently ___ Walk with walker     _x__ Bathe/dress with assistance ___ Walk Independently    ___ Shower independently ___ Walk with assistance    ___ Shower with assistance ___ No alcohol     ___ Return to work/school ________  Special Instructions: No driving smoking or alcohol     My questions have been answered and I understand these instructions. I will adhere to these goals and the provided educational materials after my discharge from the hospital.  Patient/Caregiver Signature _______________________________ Date __________  Clinician Signature _______________________________________ Date __________  Please bring this form and your medication list with you to all your follow-up doctor's appointments.

## 2020-12-07 NOTE — Progress Notes (Signed)
Occupational Therapy Session Note  Patient Details  Name: Andrea [name] MRN: [id number] Date of Birth: [birth year]  Today's Date: 12/07/2020 OT Individual Time: 1100-1156 OT Individual Time Calculation (min): 56 min    Short Term Goals: Week 1:  OT Short Term Goal 1 (Week 1): Pt will complete bathing with mod assist OT Short Term Goal 2 (Week 1): Pt will complete LB dressing with max assist and AE as needed OT Short Term Goal 3 (Week 1): Pt will complete toilet transfer with max assist of one caregiver OT Short Term Goal 4 (Week 1): Pt will complete toileting with max assist  Skilled Therapeutic Interventions/Progress Updates:  Pt greeted seated in w/c  agreeable to OT intervention. Session focus on transfer training and increasing pts activity tolerance to facilitate improvement with BADL participation. Pt transported to therapy gym in w/c with total A for time mgmt. Pt completed SB transfer from w/c>EOM towards R side with MAX A +2. Pt worked on various therapeutic activities to facilitate improvements with SB tranfsers with a focus on lateral scoots and anterior weight shifts. Pt able to laterally scoot to R<>L on mat table with supervision needing MAX cues for body mechanics and WB restrictions. Worked on lateral leans from EOM as precurosr to posterior pericare, pt able to retrieve bean bags placed undearnth pts R glut with supervision. Pt additionally completed ball tosses with OTA from EOM with a focus on anterior weight shifts when tossing the ball, pt completed task for ~ 3 mins before reporting fatigue. Pt completed additional SB transfer back to w/c going towards pts L side with increased time and effort with CGA +2 for safety. Pt transported back to room with total A where completed additional SB transfer back to bed going towards L side with MIN -MOD A +2 likely d/t fatigue. Pt left supine in bed with bed alarm activated and all needs within reach.                 Therapy  Documentation Precautions:  Precautions Precautions: Fall Restrictions Weight Bearing Restrictions: Yes RLE Weight Bearing: Non weight bearing  Pain: pt reports pain in RLE, offered rest breaks and repositioning as pain mgmt strategies.     Therapy/Group: Individual Therapy  Barron Schmid 12/07/2020, 3:31 PM

## 2020-12-07 NOTE — Progress Notes (Signed)
Physical Therapy Session Note  Patient Details  Name: Andrea [name] MRN: [id number] Date of Birth: 08-04-1925  Today's Date: 12/07/2020 PT Individual Time: 1324-4010 PT Individual Time Calculation (min): 70 min   Short Term Goals: Week 1:  PT Short Term Goal 1 (Week 1): pt will transfer supine<>sitting EOB with min A PT Short Term Goal 2 (Week 1): pt will transfer bed<>chair with LRAD and max of 1 PT Short Term Goal 3 (Week 1): pt will transfer sit<>stand with LRAD and max of 1  Skilled Therapeutic Interventions/Progress Updates:   Received pt semi-reclined in bed on the phone, pt agreeable to PT treatment, and denied any pain at rest but reports increased pain with any mobility (un-rated). Repositioning, rest breaks, and distraction done to reduce pain levels. Pt remains hyperverbal and loves to tell stories, requiring frequent cues for focus/re-direction. Session with emphasis on functional mobility/transfers, generalized strengthening, standing tolerance, and improved endurance with activity. Checked to ensure brief was clean and transferred supine<>sitting EOB with max A from flat bed using bedrails with max A to manage RLE. Pt performed SB transfer to/from bed during session with max A +2 with cues for anterior weight shifting, hand placement on board, to push through LLE, and for head/hip relationship when scooting - RLE placed on therapist's foot to adhere to precautions. Pt required max A and multimodal cues for sequencing to scoot/reposition hips in WC. Pt transported to/from room on 5C in [facility] total A for time management purposes. Pt performed x 2 additional SB transfers to/from with max A +2 with same cues and technique mentioned above. Sit<>stands with RW and max +2 from elevated mat x 5 trials using mirror for visual feedback with therapist's foot underneath RLE to maintain precautions, however pt placing ~25-50% weight through R heel - cues for upright posture/gaze, hip/trunk extension, and  to flex at hips when sitting down. Pt able to lift R foot from therapist's foot x 6 reps while standing. Pt required multiple rest breaks throughout session due to fatigue. Pt transferred sit<>supine with max A and required +2 assist and use of Trendelenburg bed position to scoot to [facility]. Concluded session with pt semi-reclined in bed, needs within reach, and bed alarm on.   Therapy Documentation Precautions:  Precautions Precautions: Fall Restrictions Weight Bearing Restrictions: Yes RLE Weight Bearing: Non weight bearing  Therapy/Group: Individual Therapy Martin Majestic PT, DPT   12/07/2020, 7:29 AM

## 2020-12-07 NOTE — Progress Notes (Signed)
Physical Therapy Session Note  Patient Details  Name: Andrea [name] MRN: [id number] Date of Birth: [birth year]  Today's Date: 12/07/2020 PT Individual Time: 5462-7035 PT Individual Time Calculation (min): 65 min   Short Term Goals: Week 1:  PT Short Term Goal 1 (Week 1): pt will transfer supine<>sitting EOB with min A PT Short Term Goal 2 (Week 1): pt will transfer bed<>chair with LRAD and max of 1 PT Short Term Goal 3 (Week 1): pt will transfer sit<>stand with LRAD and max of 1  Skilled Therapeutic Interventions/Progress Updates:    Patient in supine and reports slept well, though had some pain last night after therapy and had medication, but declined medication for this session.  Performed supine therex including ankle pumps, quad sets, heel slides AAROM, hip adductor sets with glut squeezes w/ 5 sec hold, hip abduction AAROM all x 10.  Patient rolling for hygiene and changing brief with mod A, cues and rail, then for donning pants all total A.  Patient supine to sit mod/max A with cues for technique using L LE to lift and scoot hips and assist for lifting trunk.  Patient seated EOB to comb hair with S.  Attempted sit to stand x 2 from elevated height with A to keep R LE NWB, but pt unable to stand with +2 max A.  Patient performed slide board transfer to w/c max A +2.  Performed w/c mobility x 100' with mod A for L UE placement, mod cues for technique.  Patient left in w/c with R elevating leg rest, seat alarm active and call bell/needs in reach.   Therapy Documentation Precautions:  Precautions Precautions: Fall Restrictions Weight Bearing Restrictions: Yes RLE Weight Bearing: Non weight bearing  Pain: Pain Assessment Pain Scale: 0-10 Pain Score: 0-No pain   Therapy/Group: Individual Therapy  Elray Mcgregor [place], PT 12/07/2020, 9:12 AM

## 2020-12-07 NOTE — Progress Notes (Signed)
PROGRESS NOTE   Subjective/Complaints: Participating with therapy- notes reviewed Sleepy now No evidence of clots   Objective:   VAS [place] LOWER EXTREMITY VENOUS (DVT)  Result Date: 12/06/2020  Lower Venous DVT Study Patient Name:  Andrea [name]  Date of Exam:   12/06/2020 Medical Rec #: [id number]      Accession #:    [id number] Date of Birth: 05-22-1925       Patient Gender: F Patient Age:   87 years Exam Location:  [facility] Procedure:      VAS [place] LOWER EXTREMITY VENOUS (DVT) Referring Phys: Mariam Dollar --------------------------------------------------------------------------------  Indications: Swelling of RLE post femur fracture/surgical repair.  Risk Factors: Surgery RLE ORIF of femur fracture (11/30/20) Trauma Recent fall with RLE femur fracture. Limitations: Poor ultrasound/tissue interface and edema. Comparison Study: No previous exams Performing Technologist: Jody Hill RVT, RDMS  Examination Guidelines: A complete evaluation includes B-mode imaging, spectral Doppler, color Doppler, and power Doppler as needed of all accessible portions of each vessel. Bilateral testing is considered an integral part of a complete examination. Limited examinations for reoccurring indications may be performed as noted. The reflux portion of the exam is performed with the patient in reverse Trendelenburg.  +---------+---------------+---------+-----------+----------+--------------+ RIGHT    CompressibilityPhasicitySpontaneityPropertiesThrombus Aging +---------+---------------+---------+-----------+----------+--------------+ CFV      Full           Yes      Yes                                 +---------+---------------+---------+-----------+----------+--------------+ SFJ      Full                                                        +---------+---------------+---------+-----------+----------+--------------+ FV Prox  Full            Yes      Yes                                 +---------+---------------+---------+-----------+----------+--------------+ FV Mid   Full           Yes      Yes                                 +---------+---------------+---------+-----------+----------+--------------+ FV DistalFull           Yes      Yes                                 +---------+---------------+---------+-----------+----------+--------------+ PFV      Full                                                        +---------+---------------+---------+-----------+----------+--------------+  POP      Full           Yes      No                                  +---------+---------------+---------+-----------+----------+--------------+ PTV      Full                                                        +---------+---------------+---------+-----------+----------+--------------+ PERO     Full                                                        +---------+---------------+---------+-----------+----------+--------------+   +---------+---------------+---------+-----------+----------+--------------+ LEFT     CompressibilityPhasicitySpontaneityPropertiesThrombus Aging +---------+---------------+---------+-----------+----------+--------------+ CFV      Full           Yes      Yes                                 +---------+---------------+---------+-----------+----------+--------------+ SFJ      Full                                                        +---------+---------------+---------+-----------+----------+--------------+ FV Prox  Full           Yes      Yes                                 +---------+---------------+---------+-----------+----------+--------------+ FV Mid   Full           Yes      Yes                                 +---------+---------------+---------+-----------+----------+--------------+ FV DistalFull           Yes      Yes                                  +---------+---------------+---------+-----------+----------+--------------+ PFV      Full                                                        +---------+---------------+---------+-----------+----------+--------------+ POP      Full           Yes      Yes                                 +---------+---------------+---------+-----------+----------+--------------+ PTV  Full                                                        +---------+---------------+---------+-----------+----------+--------------+ PERO     Full                                                        +---------+---------------+---------+-----------+----------+--------------+     Summary: BILATERAL: - No evidence of deep vein thrombosis seen in the lower extremities, bilaterally. - No evidence of superficial venous thrombosis in the lower extremities, bilaterally. -No evidence of popliteal cyst, bilaterally. RIGHT: Subcutaneous edema throughout RLE   *See table(s) above for measurements and observations. Electronically signed by Sherald Hess MD on 12/06/2020 at 6:52:47 PM.    Final    Recent Labs    12/06/20 0521  WBC 10.4  HGB 8.2*  HCT 25.3*  PLT 345   Recent Labs    12/06/20 0521  NA 138  K 4.5  CL 105  CO2 27  GLUCOSE 115*  BUN 23  CREATININE 0.83  CALCIUM 8.6*    Intake/Output Summary (Last 24 hours) at 12/07/2020 1225 Last data filed at 12/07/2020 0700 Gross per 24 hour  Intake 617 ml  Output --  Net 617 ml     Pressure Injury 12/05/20 Buttocks Left Stage 2 -  Partial thickness loss of dermis presenting as a shallow open injury with a red, pink wound bed without slough. A stage 2 measuring 1cmx1cm and surrounding skin is nonblaching. entire area measures 8.5cmx (Active)  12/05/20 1700  Location: Buttocks  Location Orientation: Left  Staging: Stage 2 -  Partial thickness loss of dermis presenting as a shallow open injury with a red, pink wound bed without slough.  Wound  Description (Comments): A stage 2 measuring 1cmx1cm and surrounding skin is nonblaching. entire area measures 8.5cmx3.7cm.  Present on Admission: Yes    Physical Exam: Vital Signs Blood pressure (!) 112/52, pulse 76, temperature 98.5 F (36.9 C), temperature source Oral, resp. rate 18, height  (1.626 m), weight 71.3 kg, SpO2 94 %. Gen: no distress, normal appearing HEENT: oral mucosa pink and moist, NCAT Cardio: Reg rate Chest: normal effort, normal rate of breathing Abd: soft, non-distended Musculoskeletal:     Cervical back: Normal range of motion.     Comments: Right thigh and knee swollen, bruised. Expectedly tender to palpation and attempts at PROM.  Skin:    Comments: Incision is dressed with scant blood beneath foam dressing. Left buttock wound, stage 2 Neurological:     Mental Status: She is alert and oriented to person, place, and time.     Comments: Patient is alert.  Good insight and awareness. Mild STM deficits.  No acute distress.  Makes eye contact with examiner.  Follows simple commands. HOH. Otherwise CN normal. No focal sensory loss in limbs. Motor 4+/5 UE bilaterally. RLE 1/5 d/t pain at hip and knee and 4/5 ADF/PF. LLE 2+ to 3-/5 HF, 3/5 KE and 4/5 ADF/PF. DTR's 1+ no focal limb ataxia or tremor appreciated while supine in bed  Psychiatric:        Mood and Affect: Mood normal.  Behavior: Behavior normal.     Assessment/Plan: 1. Functional deficits which require 3+ hours per day of interdisciplinary therapy in a comprehensive inpatient rehab setting. Physiatrist is providing close team supervision and 24 hour management of active medical problems listed below. Physiatrist and rehab team continue to assess barriers to discharge/monitor patient progress toward functional and medical goals  Care Tool:  Bathing    Body parts bathed by patient: Right arm, Left arm   Body parts bathed by helper: Front perineal area, Buttocks, Right upper leg, Left upper leg,  Right lower leg, Left lower leg     Bathing assist Assist Level: 2 Helpers     Upper Body Dressing/Undressing Upper body dressing   What is the patient wearing?: Hospital gown only    Upper body assist Assist Level: Moderate Assistance - Patient 50 - 74%    Lower Body Dressing/Undressing Lower body dressing      What is the patient wearing?: Incontinence brief     Lower body assist Assist for lower body dressing: 2 Helpers     Toileting Toileting    Toileting assist Assist for toileting: 2 Helpers     Transfers Chair/bed transfer  Transfers assist     Chair/bed transfer assist level: Maximal Assistance - Patient 25 - 49%     Locomotion Ambulation   Ambulation assist   Ambulation activity did not occur: Safety/medical concerns          Walk 10 feet activity   Assist  Walk 10 feet activity did not occur: Safety/medical concerns (pain, weakness, decreased balance, poor adherance to RLE NWB precautions)        Walk 50 feet activity   Assist Walk 50 feet with 2 turns activity did not occur: Safety/medical concerns         Walk 150 feet activity   Assist Walk 150 feet activity did not occur: Safety/medical concerns         Walk 10 feet on uneven surface  activity   Assist Walk 10 feet on uneven surfaces activity did not occur: Safety/medical concerns         Wheelchair     Assist Is the patient using a wheelchair?: Yes Type of Wheelchair: Manual Wheelchair activity did not occur: Safety/medical concerns  Wheelchair assist level: Moderate Assistance - Patient 50 - 74% Max wheelchair distance: 100'    Wheelchair 50 feet with 2 turns activity    Assist    Wheelchair 50 feet with 2 turns activity did not occur: Safety/medical concerns   Assist Level: Moderate Assistance - Patient 50 - 74%   Wheelchair 150 feet activity     Assist  Wheelchair 150 feet activity did not occur: Safety/medical concerns        Blood pressure (!) 112/52, pulse 76, temperature 98.5 F (36.9 C), temperature source Oral, resp. rate 18, height 5\' 4"  (1.626 m), weight 71.3 kg, SpO2 94 %.  Medical Problem List and Plan: 1.  Right distal femur fracture with intercondylar extension secondary to fall.  Status post ORIF 12/02/2020.  Nonweightbearing             -patient may shower             -ELOS/Goals: 17-21 days, supervision to min assist goals  Continue CIR 2.  Impaired mobility: -DVT/anticoagulation: Vascular study reviewed and is negative.  Pharmaceutical: Lovenox             -antiplatelet therapy: Aspirin 81 mg daily 3. Femur fracture/right knee pain:  Hydrocodone as needed. Voltaren gel 2mg  QID  4. Mood: Provide emotional support             -antipsychotic agents: N/A 5. Neuropsych: This patient is capable of making decisions on her own behalf. 6. Skin/Wound Care: Routine skin checks. Local wound care 7. Fluids/Electrolytes/Nutrition: Routine in and outs with follow-up chemistries 8.  Acute blood loss anemia.  Hgb stable- continue to monitor weekly 9.  Hyperlipidemia.  Lipitor 10.  Hypertension.  discontinue Norvasc. Monitor with increased mobility 11.  Hypothyroidism.  Synthroid 12.  History of CVA with resulting gait abnormality.  Continue aspirin 13. Overweight BMI 26.98: provide counseling.   LOS: 2 days A FACE TO FACE EVALUATION WAS PERFORMED  Tarisa Paola 12/07/2020, 12:25 PM

## 2020-12-07 NOTE — Progress Notes (Signed)
Patient resting in bed,alert and pleasant, states she does no have much pain at this time "but today she had had a fairly good work ,with her therapist..She further informed Clinical research associate that she will be going back home and need to do whatever she need to do to get back home. Assisted with repositioned in bed for comfort. Complain of pain to surgical right thigh an and right knee, medicated with prn Norco,po x1,for pain Surgical dressing to right upper thigh area, CDI, no.drainage, monitor and assisted  0615 Denies pain, incontinent of urine, care provided, urine has a very strong foul odor, encourage po water intake,monitor.

## 2020-12-08 NOTE — IPOC Note (Signed)
Overall Plan of Care [facility]) Patient Details Name: Andrea [name] MRN: [id number] DOB: 09-11-1925  Admitting Diagnosis: Femur fracture [facility])  Hospital Problems: Principal Problem:   Femur fracture (HCC) Active Problems:   Pressure injury of skin     Functional Problem List: Nursing Bladder, Bowel, Pain, Endurance, Edema, Medication Management, Safety  PT Balance, Endurance, Motor, Pain, Sensory, Skin Integrity  OT Balance, Cognition, Endurance, Motor, Pain, Safety  SLP    TR         Basic ADL's: OT Grooming, Bathing, Dressing, Toileting     Advanced  ADL's: OT Simple Meal Preparation     Transfers: PT Bed Mobility, Bed to Chair, Furniture, Customer service manager, Research scientist (life sciences): PT Ambulation, Psychologist, prison and probation services, Stairs     Additional Impairments: OT None  SLP        TR      Anticipated Outcomes Item Anticipated Outcome  Self Feeding    Swallowing      Basic self-care  Min assist  Toileting  Min assist   Bathroom Transfers Min assist  Bowel/Bladder  w mod I  Transfers  supervision  Locomotion  min A with LRAD  Communication     Cognition     Pain  at or below level 4  Safety/Judgment  maintain w cues/reminders   Therapy Plan: PT Intensity: Minimum of 1-2 x/day ,45 to 90 minutes PT Frequency: 5 out of 7 days PT Duration Estimated Length of Stay: 21-24 days OT Intensity: Minimum of 1-2 x/day, 45 to 90 minutes OT Frequency: 5 out of 7 days OT Duration/Estimated Length of Stay: 21-24 days     Due to the current state of emergency, patients may not be receiving their 3-hours of Medicare-mandated therapy.   Team Interventions: Nursing Interventions Bladder Management, Disease Management/Prevention, Medication Management, Discharge Planning, Skin Care/Wound Management, Pain Management, Bowel Management, Patient/Family Education  PT interventions Ambulation/gait training, Discharge planning, Functional mobility training, Psychosocial support,  Therapeutic Activities, Visual/perceptual remediation/compensation, Balance/vestibular training, Disease management/prevention, Neuromuscular re-education, Skin care/wound management, Therapeutic Exercise, Wheelchair propulsion/positioning, Cognitive remediation/compensation, DME/adaptive equipment instruction, Pain management, Splinting/orthotics, UE/LE Strength taining/ROM, Community reintegration, Development worker, international aid stimulation, Patient/family education, Museum/gallery curator, UE/LE Coordination activities  OT Interventions Warden/ranger, Cognitive remediation/compensation, Discharge planning, Disease mangement/prevention, DME/adaptive equipment instruction, Functional mobility training, Pain management, Patient/family education, Psychosocial support, Self Care/advanced ADL retraining, Skin care/wound managment, Therapeutic Activities, Therapeutic Exercise, UE/LE Strength taining/ROM, UE/LE Coordination activities  SLP Interventions    TR Interventions    SW/CM Interventions Discharge Planning, Psychosocial Support, Patient/Family Education   Barriers to Discharge MD  Medical stability  Nursing Home environment access/layout, Incontinence, Weight bearing restrictions, Lack of/limited family support home alone ramped entry w BSC, RW, SPC and driving PTA  PT Decreased caregiver support, Wound Care, Lack of/limited family support, Weight bearing restrictions RLE NWB status, pain, generalized weakness/deconditioning  OT Decreased caregiver support, Home environment access/layout, Incontinence, Lack of/limited family support, Weight bearing restrictions    SLP      SW Lack of/limited family support, Weight bearing restrictions     Team Discharge Planning: Destination: PT-Home ,OT- Home , SLP-  Projected Follow-up: PT-Home health PT, OT-  Skilled nursing facility, SLP-  Projected Equipment Needs: PT-To be determined, OT- Tub/shower bench, SLP-  Equipment Details: PT-has RW, OT-   Patient/family involved in discharge planning: PT- Patient,  OT-Patient, SLP-   MD ELOS: 17-21 days Medical Rehab Prognosis:  Excellent Assessment: Andrea [name] is a 87 year old woman who is admitted to  CIR with a right distal femur fracture with intercondylar extension secondary to fall.  Status post ORIF 12/02/2020.  Nonweightbearing. Pain is being managed with hydrocodone. Medications are being managed, and labs and vitals are being monitored regularly.      See Team Conference Notes for weekly updates to the plan of care

## 2020-12-08 NOTE — Progress Notes (Signed)
PROGRESS NOTE   Subjective/Complaints: No complaints, questions, or concerns this morning Tolerating therapy Therapy notes reviewed and has been complaining of right hip pain during therapy sessions- using Norco approx once daily.   ROS: +right hip pain with therapy   Objective:   VAS [place] LOWER EXTREMITY VENOUS (DVT)  Result Date: 12/06/2020  Lower Venous DVT Study Patient Name:  Andrea [name]  Date of Exam:   12/06/2020 Medical Rec #: [id number]      Accession #:    [id number] Date of Birth: [birth year]       Patient Gender: F Patient Age:   87 years Exam Location:  [facility] Procedure:      VAS [place] LOWER EXTREMITY VENOUS (DVT) Referring Phys: Mariam Dollar --------------------------------------------------------------------------------  Indications: Swelling of RLE post femur fracture/surgical repair.  Risk Factors: Surgery RLE ORIF of femur fracture (11/30/20) Trauma Recent fall with RLE femur fracture. Limitations: Poor ultrasound/tissue interface and edema. Comparison Study: No previous exams Performing Technologist: Jody Hill RVT, RDMS  Examination Guidelines: A complete evaluation includes B-mode imaging, spectral Doppler, color Doppler, and power Doppler as needed of all accessible portions of each vessel. Bilateral testing is considered an integral part of a complete examination. Limited examinations for reoccurring indications may be performed as noted. The reflux portion of the exam is performed with the patient in reverse Trendelenburg.  +---------+---------------+---------+-----------+----------+--------------+ RIGHT    CompressibilityPhasicitySpontaneityPropertiesThrombus Aging +---------+---------------+---------+-----------+----------+--------------+ CFV      Full           Yes      Yes                                 +---------+---------------+---------+-----------+----------+--------------+ SFJ      Full                                                         +---------+---------------+---------+-----------+----------+--------------+ FV Prox  Full           Yes      Yes                                 +---------+---------------+---------+-----------+----------+--------------+ FV Mid   Full           Yes      Yes                                 +---------+---------------+---------+-----------+----------+--------------+ FV DistalFull           Yes      Yes                                 +---------+---------------+---------+-----------+----------+--------------+ PFV      Full                                                        +---------+---------------+---------+-----------+----------+--------------+  POP      Full           Yes      No                                  +---------+---------------+---------+-----------+----------+--------------+ PTV      Full                                                        +---------+---------------+---------+-----------+----------+--------------+ PERO     Full                                                        +---------+---------------+---------+-----------+----------+--------------+   +---------+---------------+---------+-----------+----------+--------------+ LEFT     CompressibilityPhasicitySpontaneityPropertiesThrombus Aging +---------+---------------+---------+-----------+----------+--------------+ CFV      Full           Yes      Yes                                 +---------+---------------+---------+-----------+----------+--------------+ SFJ      Full                                                        +---------+---------------+---------+-----------+----------+--------------+ FV Prox  Full           Yes      Yes                                 +---------+---------------+---------+-----------+----------+--------------+ FV Mid   Full           Yes      Yes                                  +---------+---------------+---------+-----------+----------+--------------+ FV DistalFull           Yes      Yes                                 +---------+---------------+---------+-----------+----------+--------------+ PFV      Full                                                        +---------+---------------+---------+-----------+----------+--------------+ POP      Full           Yes      Yes                                 +---------+---------------+---------+-----------+----------+--------------+ PTV  Full                                                        +---------+---------------+---------+-----------+----------+--------------+ PERO     Full                                                        +---------+---------------+---------+-----------+----------+--------------+     Summary: BILATERAL: - No evidence of deep vein thrombosis seen in the lower extremities, bilaterally. - No evidence of superficial venous thrombosis in the lower extremities, bilaterally. -No evidence of popliteal cyst, bilaterally. RIGHT: Subcutaneous edema throughout RLE   *See table(s) above for measurements and observations. Electronically signed by Sherald Hess MD on 12/06/2020 at 6:52:47 PM.    Final    Recent Labs    12/06/20 0521  WBC 10.4  HGB 8.2*  HCT 25.3*  PLT 345   Recent Labs    12/06/20 0521  NA 138  K 4.5  CL 105  CO2 27  GLUCOSE 115*  BUN 23  CREATININE 0.83  CALCIUM 8.6*    Intake/Output Summary (Last 24 hours) at 12/08/2020 1136 Last data filed at 12/08/2020 0825 Gross per 24 hour  Intake 310 ml  Output --  Net 310 ml     Pressure Injury 12/05/20 Buttocks Left Stage 2 -  Partial thickness loss of dermis presenting as a shallow open injury with a red, pink wound bed without slough. A stage 2 measuring 1cmx1cm and surrounding skin is nonblaching. entire area measures 8.5cmx (Active)  12/05/20 1700  Location: Buttocks  Location  Orientation: Left  Staging: Stage 2 -  Partial thickness loss of dermis presenting as a shallow open injury with a red, pink wound bed without slough.  Wound Description (Comments): A stage 2 measuring 1cmx1cm and surrounding skin is nonblaching. entire area measures 8.5cmx3.7cm.  Present on Admission: Yes    Physical Exam: Vital Signs Blood pressure (!) 130/53, pulse 67, temperature 98.2 F (36.8 C), temperature source Oral, resp. rate 18, height 5\' 4"  (1.626 m), weight 71.3 kg, SpO2 97 %. Gen: no distress, normal appearing HEENT: oral mucosa pink and moist, NCAT Cardio: Reg rate Chest: normal effort, normal rate of breathing Abd: soft, non-distended Musculoskeletal:     Cervical back: Normal range of motion.     Comments: Right thigh and knee swollen, bruised. Expectedly tender to palpation and attempts at PROM.  Skin:    Comments: Incision is dressed with scant blood beneath foam dressing. Left buttock wound, stage 2 Neurological:     Mental Status: She is alert and oriented to person, place, and time.     Comments: Patient is alert.  Good insight and awareness. Mild STM deficits.  No acute distress.  Makes eye contact with examiner.  Follows simple commands. HOH. Otherwise CN normal. No focal sensory loss in limbs. Motor 4+/5 UE bilaterally. RLE 1/5 d/t pain at hip and knee and 4/5 ADF/PF. LLE 2+ to 3-/5 HF, 3/5 KE and 4/5 ADF/PF. DTR's 1+ no focal limb ataxia or tremor appreciated while supine in bed  Psychiatric:        Mood and Affect: Mood normal.  Behavior: Behavior normal.   GU: urine with foul strong odor   Assessment/Plan: 1. Functional deficits which require 3+ hours per day of interdisciplinary therapy in a comprehensive inpatient rehab setting. Physiatrist is providing close team supervision and 24 hour management of active medical problems listed below. Physiatrist and rehab team continue to assess barriers to discharge/monitor patient progress toward functional  and medical goals  Care Tool:  Bathing    Body parts bathed by patient: Right arm, Left arm, Chest, Abdomen, Face   Body parts bathed by helper: Front perineal area, Buttocks, Right upper leg, Left upper leg, Right lower leg, Left lower leg     Bathing assist Assist Level: Maximal Assistance - Patient 24 - 49%     Upper Body Dressing/Undressing Upper body dressing   What is the patient wearing?: Pull over shirt    Upper body assist Assist Level: Moderate Assistance - Patient 50 - 74%    Lower Body Dressing/Undressing Lower body dressing      What is the patient wearing?: Incontinence brief, Pants     Lower body assist Assist for lower body dressing: Maximal Assistance - Patient 25 - 49%     Toileting Toileting    Toileting assist Assist for toileting: Moderate Assistance - Patient 50 - 74%     Transfers Chair/bed transfer  Transfers assist     Chair/bed transfer assist level: 2 Helpers     Locomotion Ambulation   Ambulation assist   Ambulation activity did not occur: Safety/medical concerns          Walk 10 feet activity   Assist  Walk 10 feet activity did not occur: Safety/medical concerns (pain, weakness, decreased balance, poor adherance to RLE NWB precautions)        Walk 50 feet activity   Assist Walk 50 feet with 2 turns activity did not occur: Safety/medical concerns         Walk 150 feet activity   Assist Walk 150 feet activity did not occur: Safety/medical concerns         Walk 10 feet on uneven surface  activity   Assist Walk 10 feet on uneven surfaces activity did not occur: Safety/medical concerns         Wheelchair     Assist Is the patient using a wheelchair?: Yes Type of Wheelchair: Manual Wheelchair activity did not occur: Safety/medical concerns  Wheelchair assist level: Moderate Assistance - Patient 50 - 74% Max wheelchair distance: 100'    Wheelchair 50 feet with 2 turns  activity    Assist    Wheelchair 50 feet with 2 turns activity did not occur: Safety/medical concerns   Assist Level: Moderate Assistance - Patient 50 - 74%   Wheelchair 150 feet activity     Assist  Wheelchair 150 feet activity did not occur: Safety/medical concerns       Blood pressure (!) 130/53, pulse 67, temperature 98.2 F (36.8 C), temperature source Oral, resp. rate 18, height  (1.626 m), weight 71.3 kg, SpO2 97 %.  Medical Problem List and Plan: 1.  Right distal femur fracture with intercondylar extension secondary to fall.  Status post ORIF 12/02/2020.  Nonweightbearing             -patient may shower             -ELOS/Goals: 17-21 days, supervision to min assist goals  Continue CIR 2.  Impaired mobility: -DVT/anticoagulation: Vascular study reviewed and is negative.  Pharmaceutical: Lovenox             -  antiplatelet therapy: Aspirin 81 mg daily 3. Femur fracture/right knee pain: Continue Hydrocodone as needed. Voltaren gel 2mg  QID  4. Mood: Provide emotional support             -antipsychotic agents: N/A 5. Neuropsych: This patient is capable of making decisions on her own behalf. 6. Skin/Wound Care: Routine skin checks. Local wound care 7. Fluids/Electrolytes/Nutrition: Routine in and outs with follow-up chemistries 8.  Acute blood loss anemia.  Hgb stable- continue to monitor weekly 9.  Hyperlipidemia.  Lipitor 10.  Hypotension:  discontinue Norvasc. Monitor with increased mobility 11.  Hypothyroidism.  Synthroid 12.  History of CVA with resulting gait abnormality.  Continue aspirin 13. Overweight BMI 26.98: provide counseling.  14. Urine with foul strong odor- patient is alert and oriented and bright with no urinary complaints, afebrile- monitor for urinary symptoms.  15. Constipation: continue colace 100mg  BID  LOS: 3 days A FACE TO FACE EVALUATION WAS PERFORMED  P Murice Barbar 12/08/2020, 11:36 AM

## 2020-12-08 NOTE — Progress Notes (Signed)
Physical Therapy Session Note  Patient Details  Name: Andrea [name] MRN: [id number] Date of Birth: 1925/04/06  Today's Date: 12/08/2020 PT Individual Time: 1100-1155 PT Individual Time Calculation (min): 55 min   Short Term Goals: Week 1:  PT Short Term Goal 1 (Week 1): pt will transfer supine<>sitting EOB with min A PT Short Term Goal 2 (Week 1): pt will transfer bed<>chair with LRAD and max of 1 PT Short Term Goal 3 (Week 1): pt will transfer sit<>stand with LRAD and max of 1  Skilled Therapeutic Interventions/Progress Updates:   Received pt sitting in WC reporting discomfort on buttocks. Pt agreeable to PT treatment and reported mild discomfort in R hip that worsens with mobility - rated 8-9/10. RN notified and present to administer medication. Educated pt on pressure relief techniques (particularly lateral/anterior weight shifting). Pt performed x 5 reps on each side with cues. Session with emphasis on functional mobility/transfers, generalized strengthening, dynamic standing balance/coordination, and improved endurance with activity. Pt transported to/from room on 5C in [facility] total A for time management purposes. Pt transferred to/from mat via SB with max/total A +2. Pt continues to require cues for anterior weight shifting, hand placement on SB, head/hips relationship, and to push through LLE as pt frequently lifting LLE from floor -RLE supported on therapist's foot. Pt demonstrates decreased initiation with transfers and tends to lean posteriorly. Attempted to stand with 1UE on mat and 1 UE on RW, however pt unable to initiate standing; both UEs on RW tends to work better for pt. Pt transferred sit<>stand x 3 trials with RW and max A+2 using mirror for visual feedback with pt's RLE on therapist's foot to maintain NWB precautions. Pt continues to place ~25-30% weight through RLE but did improve with repetition. Pt also required cues to flex at hips when sitting and for upright posture/gaze to correct  L lateral lean and L trunk/pelvic rotation in standing. Concluded session with pt sitting in WC, needs within reach, and chair pad alarm.    Therapy Documentation Precautions:  Precautions Precautions: Fall Restrictions Weight Bearing Restrictions: Yes RLE Weight Bearing: Weight bearing as tolerated  Therapy/Group: Individual Therapy Martin Majestic PT, DPT   12/08/2020, 7:27 AM

## 2020-12-08 NOTE — Progress Notes (Signed)
Occupational Therapy Session Note  Patient Details  Name: Andrea [name] MRN: [id number] Date of Birth: 27-Dec-1925  Today's Date: 12/08/2020 OT Individual Time: 1300-1400 OT Individual Time Calculation (min): 60 min    Short Term Goals: Week 1:  OT Short Term Goal 1 (Week 1): Pt will complete bathing with mod assist OT Short Term Goal 2 (Week 1): Pt will complete LB dressing with max assist and AE as needed OT Short Term Goal 3 (Week 1): Pt will complete toilet transfer with max assist of one caregiver OT Short Term Goal 4 (Week 1): Pt will complete toileting with max assist  Skilled Therapeutic Interventions/Progress Updates:    Treatment session with focus on trunk control and weight shifting as needed for sit > stand, LB dressing, and transfers.  Pt received upright in w/c reporting feeling "drunk" due to fatigue and "not thinking right".  Pt agreeable to therapy session.  Pt transported to therapy gym total assist for time management.  Completed slide board transfer to R with max assist and then +2 to physically assist remainder of transfer due to decreased weight shifting.  Engaged in vertical card board activity incorporating anterior and diagonal reaching to facilitate increased weight shifting as needed for self-care tasks and mobility.  Engaged in sit > stand mod +2 with RW for UE support and use of mirror for feedback on upright posture.  Multimodal cues for NWB through RLE when standing.  Pt able to release LUE briefly while in standing as needed to progress towards clothing management.  Pt completed x3 with multimodal cues for anterior weight shift during sit <> stand.  Slide board transfer back to w/c to L max assist with improved weight shifting and carryover of hand placement.  Pt returned to room and required max +2 when transferring to R.  Pt remained semi-reclined with all needs in reach.  Therapy Documentation Precautions:  Precautions Precautions: Fall Restrictions Weight  Bearing Restrictions: Yes RLE Weight Bearing: Weight bearing as tolerated General:   Vital Signs: Therapy Vitals Temp: 98.8 F (37.1 C) Temp Source: Oral Pulse Rate: 76 Resp: 16 BP: (!) 109/50 Patient Position (if appropriate): Sitting Oxygen Therapy SpO2: (!) 79 % O2 Device: Room Air Pain: Pain Assessment Pain Scale: 0-10 Pain Score: 8  Pain Type: Acute pain Pain Location: Hip Pain Orientation: Right Pain Radiating Towards: buttocks Pain Descriptors / Indicators: Aching Pain Frequency: Intermittent Pain Onset: With Activity Pain Intervention(s): Medication (See eMAR);Repositioned    Therapy/Group: Individual Therapy  Rosalio Loud 12/08/2020, 3:05 PM

## 2020-12-08 NOTE — Progress Notes (Signed)
Occupational Therapy Session Note  Patient Details  Name: Andrea [name] MRN: [id number] Date of Birth: March 19, 1926  Today's Date: 12/08/2020 OT Individual Time: 7371-0626 OT Individual Time Calculation (min): 57 min    Short Term Goals: Week 1:  OT Short Term Goal 1 (Week 1): Pt will complete bathing with mod assist OT Short Term Goal 2 (Week 1): Pt will complete LB dressing with max assist and AE as needed OT Short Term Goal 3 (Week 1): Pt will complete toilet transfer with max assist of one caregiver OT Short Term Goal 4 (Week 1): Pt will complete toileting with max assist  Skilled Therapeutic Interventions/Progress Updates:    Treatment session with focus on self-care retraining, functional mobility, and sit > stand.  Pt received supine in bed reporting that she did not sleep well overnight and with pain in buttocks this morning.  Engaged in rolling R and L with mod assist to complete LB hygiene.  Therapist providing multimodal cues for rolling in bed.  Completed sidelying to sitting max assist.  Pt demonstrating improved unsupported sitting EOB.  Engaged in LB dressing with pt able to thread LLE but requiring assist to thread RLE.  Attempted sit > stand with +2 with various attempts with no success. Pt reports decreased strength due to lack of sleep.  Returned to supine and completed LB dressing by rolling.  Pt returned to sitting EOB and completed UB bathing/dressing with CGA for sitting balance.  Pt completed slide board transfer to w/c with max assist and +2 for safety to stabilize w/c.  Pt completed grooming tasks seated at sink with min cues for initiation.  Pt remained upright in w/c with chair alarm on and all needs in reach.  Therapy Documentation Precautions:  Precautions Precautions: Fall Restrictions Weight Bearing Restrictions: Yes RLE Weight Bearing: Weight bearing as tolerated Pain: Pain Assessment Pain Scale: 0-10 Pain Score: 8  Pain Type: Acute pain Pain Location:  Hip Pain Orientation: Right Pain Radiating Towards: buttocks Pain Descriptors / Indicators: Aching Pain Frequency: Intermittent Pain Onset: With Activity Pain Intervention(s): Medication (See eMAR);Repositioned    Therapy/Group: Individual Therapy  Rosalio Loud 12/08/2020, 11:45 AM

## 2020-12-09 DIAGNOSIS — K5901 Slow transit constipation: Secondary | ICD-10-CM

## 2020-12-09 NOTE — Progress Notes (Signed)
PROGRESS NOTE   Subjective/Complaints: Ongoing right hip pain. Pain meds help. Rested well. Remembered me from admission  ROS: Patient denies fever, rash, sore throat, blurred vision, nausea, vomiting, diarrhea, cough, shortness of breath or chest pain,   headache, or mood change.    Objective:   No results found. No results for input(s): WBC, HGB, HCT, PLT in the last 72 hours.  No results for input(s): NA, K, CL, CO2, GLUCOSE, BUN, CREATININE, CALCIUM in the last 72 hours.   Intake/Output Summary (Last 24 hours) at 12/09/2020 1130 Last data filed at 12/09/2020 0946 Gross per 24 hour  Intake 538 ml  Output --  Net 538 ml     Pressure Injury 12/05/20 Buttocks Left Stage 2 -  Partial thickness loss of dermis presenting as a shallow open injury with a red, pink wound bed without slough. A stage 2 measuring 1cmx1cm and surrounding skin is nonblaching. entire area measures 8.5cmx (Active)  12/05/20 1700  Location: Buttocks  Location Orientation: Left  Staging: Stage 2 -  Partial thickness loss of dermis presenting as a shallow open injury with a red, pink wound bed without slough.  Wound Description (Comments): A stage 2 measuring 1cmx1cm and surrounding skin is nonblaching. entire area measures 8.5cmx3.7cm.  Present on Admission: Yes    Physical Exam: Vital Signs Blood pressure (!) 118/47, pulse 69, temperature 98.1 F (36.7 C), temperature source Oral, resp. rate 20, height 5\' 4"  (1.626 m), weight 71.3 kg, SpO2 98 %. Constitutional: No distress . Vital signs reviewed. HEENT: NCAT, EOMI, oral membranes moist Neck: supple Cardiovascular: RRR without murmur. No JVD    Respiratory/Chest: CTA Bilaterally without wheezes or rales. Normal effort    GI/Abdomen: BS +, non-tender, non-distended Ext: no clubbing, cyanosis, or edema Psych: pleasant and cooperative  Musculoskeletal:     Cervical back: Normal range of motion.      Comments: Right thigh and knee swollen, bruised. Expectedly tender to palpation and attempts at PROM.  Skin:    Comments: Incision is dressed with scant blood beneath foam dressing. Left buttock wound, stage 2 Neurological:     Mental Status: She is alert and oriented to person, place, and time.     Comments: Patient is alert.  Good insight and awareness. Mild STM deficits.  No acute distress.  Makes eye contact with examiner.  Follows simple commands. HOH. Otherwise CN normal. No focal sensory loss in limbs. Motor 4+/5 UE bilaterally. RLE 1/5 d/t pain at hip and knee and 4/5 ADF/PF. LLE 2+ to 3-/5 HF, 3/5 KE and 4/5 ADF/PF. DTR's 1+ no focal limb ataxia or tremor appreciated while supine in bed     Assessment/Plan: 1. Functional deficits which require 3+ hours per day of interdisciplinary therapy in a comprehensive inpatient rehab setting. Physiatrist is providing close team supervision and 24 hour management of active medical problems listed below. Physiatrist and rehab team continue to assess barriers to discharge/monitor patient progress toward functional and medical goals  Care Tool:  Bathing    Body parts bathed by patient: Right arm, Left arm, Chest, Abdomen, Face   Body parts bathed by helper: Front perineal area, Buttocks, Right upper leg, Left upper  leg, Right lower leg, Left lower leg     Bathing assist Assist Level: Maximal Assistance - Patient 24 - 49%     Upper Body Dressing/Undressing Upper body dressing   What is the patient wearing?: Pull over shirt    Upper body assist Assist Level: Moderate Assistance - Patient 50 - 74%    Lower Body Dressing/Undressing Lower body dressing      What is the patient wearing?: Incontinence brief, Pants     Lower body assist Assist for lower body dressing: Maximal Assistance - Patient 25 - 49%     Toileting Toileting    Toileting assist Assist for toileting: Moderate Assistance - Patient 50 - 74%     Transfers Chair/bed  transfer  Transfers assist     Chair/bed transfer assist level: 2 Helpers     Locomotion Ambulation   Ambulation assist   Ambulation activity did not occur: Safety/medical concerns          Walk 10 feet activity   Assist  Walk 10 feet activity did not occur: Safety/medical concerns (pain, weakness, decreased balance, poor adherance to RLE NWB precautions)        Walk 50 feet activity   Assist Walk 50 feet with 2 turns activity did not occur: Safety/medical concerns         Walk 150 feet activity   Assist Walk 150 feet activity did not occur: Safety/medical concerns         Walk 10 feet on uneven surface  activity   Assist Walk 10 feet on uneven surfaces activity did not occur: Safety/medical concerns         Wheelchair     Assist Is the patient using a wheelchair?: Yes Type of Wheelchair: Manual Wheelchair activity did not occur: Safety/medical concerns  Wheelchair assist level: Moderate Assistance - Patient 50 - 74% Max wheelchair distance: 100'    Wheelchair 50 feet with 2 turns activity    Assist    Wheelchair 50 feet with 2 turns activity did not occur: Safety/medical concerns   Assist Level: Moderate Assistance - Patient 50 - 74%   Wheelchair 150 feet activity     Assist  Wheelchair 150 feet activity did not occur: Safety/medical concerns       Blood pressure (!) 118/47, pulse 69, temperature 98.1 F (36.7 C), temperature source Oral, resp. rate 20, height 5\' 4"  (1.626 m), weight 71.3 kg, SpO2 98 %.  Medical Problem List and Plan: 1.  Right distal femur fracture with intercondylar extension secondary to fall.  Status post ORIF 12/02/2020.  Nonweightbearing             -patient may shower             -ELOS/Goals: 17-21 days, supervision to min assist goals  -Continue CIR therapies including PT, OT  2.  Impaired mobility: -DVT/anticoagulation: Vascular study reviewed and is negative.  Pharmaceutical: Lovenox              -antiplatelet therapy: Aspirin 81 mg daily 3. Femur fracture/right knee pain: Continue Hydrocodone as needed. Voltaren gel 2mg  QID  4. Mood: Provide emotional support             -antipsychotic agents: N/A 5. Neuropsych: This patient is capable of making decisions on her own behalf. 6. Skin/Wound Care: Routine skin checks. Local wound care 7. Fluids/Electrolytes/Nutrition: Routine in and outs with follow-up chemistries 8.  Acute blood loss anemia.  Hgb stable- continue to monitor weekly 9.  Hyperlipidemia.  Lipitor 10.  Hypotension:  discontinue Norvasc. Monitor with increased mobility 11.  Hypothyroidism.  Synthroid 12.  History of CVA with resulting gait abnormality.  Continue aspirin 13. Overweight BMI 26.98: provide counseling.  14. Urine with foul strong odor- patient is alert and oriented and bright with no urinary complaints, afebrile- no longer has odor, no sx.  15. Constipation: continue colace 100mg  BID  LOS: 4 days A FACE TO FACE EVALUATION WAS PERFORMED  12/09/2020, 11:30 AM

## 2020-12-10 NOTE — Progress Notes (Signed)
Physical Therapy Session Note  Patient Details  Name: Andrea [name] MRN: [id number] Date of Birth: 1925/08/31  Today's Date: 12/10/2020 PT Individual Time: 0800-0830; 1017-5102 PT Individual Time Calculation (min): 30 min and 60 min  Short Term Goals: Week 1:  PT Short Term Goal 1 (Week 1): pt will transfer supine<>sitting EOB with min A PT Short Term Goal 2 (Week 1): pt will transfer bed<>chair with LRAD and max of 1 PT Short Term Goal 3 (Week 1): pt will transfer sit<>stand with LRAD and max of 1  Skilled Therapeutic Interventions/Progress Updates:    Session 1: Pt received supine in bed, agreeable to PT session. Pt reports pain in her tailbone at rest, reports she has been utilizing repositioning in the bed for pain management. Pt agreeable to get up to w/c this session. Assisted pt with donning pants at bed level, dependent. Pt found to be incontinent of urine. Pt reports she is aware of when she needs to urinate but reports staff has encouraged her to use the brief. Encouraged pt to call for staff when she feels urge to use the bathroom so that bedpan and/or BSC can be utilized. Also encouraged pt to call for staff if she is incontinent so that brief can be changed to avoid skin breakdown. Pt is dependent for pericare and donning of new brief. Supine to sit with assist x 2 for BLE management and trunk elevation. Slide board transfer bed to w/c initially with max A x 1 progressing to assist x 2 needed to fully scoot hips into w/c. Pt left seated in w/c in room with needs in reach, chair alarm in place. Pt demos good awareness of NWB RLE and good adherence to precautions with transfer.  Session 2: Pt received seated in bed, agreeable to PT session. No compliants of pain. Supine to sit with max A x 1 for BLE management and trunk elevation. Slide board transfer bed to/from w/c to/from mat table with max A x 2. Seated balance EOM performing ball toss 2 x 30 reps to fatigue. Seated UE strengthening  with 3# weighted dowel rod: bicep curls, chest press x 15 reps each. Seated BLE strengthening therex: marches, LAQ 2 x 10 reps each. Pt exhibits decreased hip and knee ROM B, R>L. Slide board transfer back to bed at end of session. Sit to supine max A needed for BLE management and trunk control. Pt left supine in bed with needs in reach, bed alarm in place at end of session.  Therapy Documentation Precautions:  Precautions Precautions: Fall Restrictions Weight Bearing Restrictions: Yes RLE Weight Bearing: Non weight bearing  Therapy/Group: Individual Therapy   Peter Congo, PT, DPT, CSRS  12/10/2020, 12:25 PM

## 2020-12-10 NOTE — Progress Notes (Signed)
Occupational Therapy Session Note  Patient Details  Name: Andrea [name] MRN: [id number] Date of Birth: 10-22-1925  Today's Date: 12/10/2020 OT Individual Time: 0930-1030   &   1500-1525 OT Individual Time Calculation (min): 60 min    &   25 min   Short Term Goals: Week 1:  OT Short Term Goal 1 (Week 1): Pt will complete bathing with mod assist OT Short Term Goal 2 (Week 1): Pt will complete LB dressing with max assist and AE as needed OT Short Term Goal 3 (Week 1): Pt will complete toilet transfer with max assist of one caregiver OT Short Term Goal 4 (Week 1): Pt will complete toileting with max assist  Skilled Therapeutic Interventions/Progress Updates:    AM session:   Patient seated in w/c, she notes pain under control at this time but "tail bone" is getting sore.  Reviewed and practiced w/c level repositioning and pressure relief measures that were effective.  Assisted via w/c to therapy gym.  Completed trunk mobility, core, posture  and upper body conditioning activities with good effort.  SB transfer w/c to bed with mod A of one and CGA of 2nd.  Sit to stand from elevated edge of bed - trial 1 unable, trial 2 mod A of 2 - able to maintain NWB right LE and remain standing for approx 45 seconds.  Sit to supine max A of one.  Completed bed level repositioning and pressure relief activities.  She remained in bed at close of session, bed alarm set and call bell in hand.      PM session:   Patient in bed, alert but states that she is tired and recently got into bed.   She agrees to sitting edge of bed for light activity.  Supine to sitting edge of bed with mod A.  Completed light trunk, upper body and lower body AROM/conditioning activities with good tolerance.  Sit to supine with max A.  Assisted with bed positioning mod A.  She remained in bed at close of session, bed alarm set and call bell in hand.      Therapy Documentation Precautions:  Precautions Precautions:  Fall Restrictions Weight Bearing Restrictions: Yes RLE Weight Bearing: Non weight bearing   Therapy/Group: Individual Therapy  Barrie Lyme 12/10/2020, 7:38 AM

## 2020-12-11 NOTE — Progress Notes (Signed)
PROGRESS NOTE   Subjective/Complaints: No complaints this morning Tolerating therapy well Motivated and positive Good awareness of NWB status  ROS: Patient denies fever, rash, sore throat, blurred vision, nausea, vomiting, diarrhea, cough, shortness of breath or chest pain,   headache, or mood change.    Objective:   No results found. No results for input(s): WBC, HGB, HCT, PLT in the last 72 hours.  No results for input(s): NA, K, CL, CO2, GLUCOSE, BUN, CREATININE, CALCIUM in the last 72 hours.   Intake/Output Summary (Last 24 hours) at 12/11/2020 1313 Last data filed at 12/11/2020 0730 Gross per 24 hour  Intake 120 ml  Output --  Net 120 ml     Pressure Injury 12/05/20 Buttocks Left Stage 2 -  Partial thickness loss of dermis presenting as a shallow open injury with a red, pink wound bed without slough. A stage 2 measuring 1cmx1cm and surrounding skin is nonblaching. entire area measures 8.5cmx (Active)  12/05/20 1700  Location: Buttocks  Location Orientation: Left  Staging: Stage 2 -  Partial thickness loss of dermis presenting as a shallow open injury with a red, pink wound bed without slough.  Wound Description (Comments): A stage 2 measuring 1cmx1cm and surrounding skin is nonblaching. entire area measures 8.5cmx3.7cm.  Present on Admission: Yes    Physical Exam: Vital Signs Blood pressure 134/62, pulse 71, temperature 98.6 F (37 C), resp. rate 18, height 5\' 4"  (1.626 m), weight 71.3 kg, SpO2 94 %. Constitutional: No distress . Vital signs reviewed. HEENT: NCAT, EOMI, oral membranes moist Neck: supple Cardiovascular: RRR without murmur. No JVD    Respiratory/Chest: CTA Bilaterally without wheezes or rales. Normal effort    GI/Abdomen: BS +, non-tender, non-distended Ext: no clubbing, cyanosis, or edema Psych: pleasant and cooperative, good awareness of weightbearing status Musculoskeletal:     Cervical  back: Normal range of motion.     Comments: Right thigh and knee swollen, bruised. Expectedly tender to palpation and attempts at PROM.  Skin:    Comments: Incision is dressed with scant blood beneath foam dressing. Left buttock wound, stage 2 Neurological:     Mental Status: She is alert and oriented to person, place, and time.     Comments: Patient is alert.  Good insight and awareness. Mild STM deficits.  No acute distress.  Makes eye contact with examiner.  Follows simple commands. HOH. Otherwise CN normal. No focal sensory loss in limbs. Motor 4+/5 UE bilaterally. RLE 1/5 d/t pain at hip and knee and 4/5 ADF/PF. LLE 2+ to 3-/5 HF, 3/5 KE and 4/5 ADF/PF. DTR's 1+ no focal limb ataxia or tremor appreciated while supine in bed     Assessment/Plan: 1. Functional deficits which require 3+ hours per day of interdisciplinary therapy in a comprehensive inpatient rehab setting. Physiatrist is providing close team supervision and 24 hour management of active medical problems listed below. Physiatrist and rehab team continue to assess barriers to discharge/monitor patient progress toward functional and medical goals  Care Tool:  Bathing    Body parts bathed by patient: Right arm, Left arm, Chest, Abdomen, Face   Body parts bathed by helper: Front perineal area, Buttocks, Right upper leg,  Left upper leg, Right lower leg, Left lower leg     Bathing assist Assist Level: Maximal Assistance - Patient 24 - 49%     Upper Body Dressing/Undressing Upper body dressing   What is the patient wearing?: Pull over shirt    Upper body assist Assist Level: Moderate Assistance - Patient 50 - 74%    Lower Body Dressing/Undressing Lower body dressing      What is the patient wearing?: Pants     Lower body assist Assist for lower body dressing: 2 Helpers     Toileting Toileting    Toileting assist Assist for toileting: Moderate Assistance - Patient 50 - 74%     Transfers Chair/bed  transfer  Transfers assist     Chair/bed transfer assist level: 2 Helpers     Locomotion Ambulation   Ambulation assist   Ambulation activity did not occur: Safety/medical concerns          Walk 10 feet activity   Assist  Walk 10 feet activity did not occur: Safety/medical concerns (pain, weakness, decreased balance, poor adherance to RLE NWB precautions)        Walk 50 feet activity   Assist Walk 50 feet with 2 turns activity did not occur: Safety/medical concerns         Walk 150 feet activity   Assist Walk 150 feet activity did not occur: Safety/medical concerns         Walk 10 feet on uneven surface  activity   Assist Walk 10 feet on uneven surfaces activity did not occur: Safety/medical concerns         Wheelchair     Assist Is the patient using a wheelchair?: Yes Type of Wheelchair: Manual Wheelchair activity did not occur: Safety/medical concerns  Wheelchair assist level: Moderate Assistance - Patient 50 - 74% Max wheelchair distance: 100'    Wheelchair 50 feet with 2 turns activity    Assist    Wheelchair 50 feet with 2 turns activity did not occur: Safety/medical concerns   Assist Level: Moderate Assistance - Patient 50 - 74%   Wheelchair 150 feet activity     Assist  Wheelchair 150 feet activity did not occur: Safety/medical concerns       Blood pressure 134/62, pulse 71, temperature 98.6 F (37 C), resp. rate 18, height 5\' 4"  (1.626 m), weight 71.3 kg, SpO2 94 %.  Medical Problem List and Plan: 1.  Right distal femur fracture with intercondylar extension secondary to fall.  Status post ORIF 12/02/2020.  Nonweightbearing             -patient may shower             -ELOS/Goals: 17-21 days, supervision to min assist goals  Continue CIR therapies including PT, OT  2.  Impaired mobility: -DVT/anticoagulation: Vascular study reviewed and is negative.  Pharmaceutical: Lovenox             -antiplatelet therapy:  Aspirin 81 mg daily 3. Femur fracture/right knee pain: Continue Hydrocodone as needed. Voltaren gel 2mg  QID  4. Mood: Provide emotional support             -antipsychotic agents: N/A 5. Neuropsych: This patient is capable of making decisions on her own behalf. 6. Skin/Wound Care: Routine skin checks. Local wound care 7. Fluids/Electrolytes/Nutrition: Routine in and outs with follow-up chemistries 8.  Acute blood loss anemia.  Hgb stable- continue to monitor weekly 9.  Hyperlipidemia.  Lipitor 10.  Hypotension:  discontinue Norvasc. Monitor  with increased mobility 11.  Hypothyroidism.  Synthroid 12.  History of CVA with resulting gait abnormality.  Continue aspirin 13. Overweight BMI 26.98: provide counseling.  14. Urine with foul strong odor- patient is alert and oriented and bright with no urinary complaints, afebrile- no longer has odor, no sx.  15. Constipation: continue colace 100mg  BID  LOS: 6 days A FACE TO FACE EVALUATION WAS PERFORMED  Andrea [name] P Andrea [name] 12/11/2020, 1:13 PM

## 2020-12-11 NOTE — Progress Notes (Signed)
Occupational Therapy Session Note  Patient Details  Name: Andrea [name] MRN: [id number] Date of Birth: 05-24-1925  Today's Date: 12/11/2020 OT Individual Time: 4696-2952 and 1300-1340 OT Individual Time Calculation (min): 58 min and 40 min   Short Term Goals: Week 1:  OT Short Term Goal 1 (Week 1): Pt will complete bathing with mod assist OT Short Term Goal 2 (Week 1): Pt will complete LB dressing with max assist and AE as needed OT Short Term Goal 3 (Week 1): Pt will complete toilet transfer with max assist of one caregiver OT Short Term Goal 4 (Week 1): Pt will complete toileting with max assist  Skilled Therapeutic Interventions/Progress Updates:    1) Treatment session with focus on sitting balance, sit > stand, and LB dressing with use of AE.  Pt received semi-reclined in bed agreeable to therapy session.  Pt declined bathing this session but needing to don pants.  Pt completed bed mobility mod assist with cues for sequencing.  Engaged in LB dressing seated at EOB with max assist and +2 for sit > stand to pull pants over hips.  Engaged in slide board transfer to L max assist +2 with multimodal cues for anterior weight shift. Pt able to maintain NWB through RLE when transferring.  Pt pants ripped during transfer therefore engaged in LB dressing again this time incorporating education with use of reacher for LB dressing. Pt able to thread BLE with min assist with use of reacher and then attempted to pull up with lateral leans.  Pt required sit > stand Mod A +2 for pull pants fully over hips.  Therapist positioning foot under pt's R foot to ensure no WB during sit > stand.  Engaged in sit > stand x2 with use of RW and mirror for visual feedback for increased upright standing.  Returned to w/c via slide board Max A +2.  Pt remained upright in w/c with chair alarm on and all needs in reach.  2) Treatment session with focus on functional mobility and weight shifting.  Pt received semi-reclined in bed  reporting fatigue from sitting up in w/c earlier.  Pt agreeable to engage in therapy session, expressing desire to remain in room.  Pt completed bed mobility with CGA and use of hospital bed rails when coming to sitting EOB.  Engaged in lateral leans and anterior weight shifting from EOB with focus on weight shifting as needed for LB bathing/dressing and transfers.  Pt requiring min-mod assist for trunk control during lateral weight shifting due to tendency to lean posteriorly.  Pt required max assist +2 for lateral scoots along EOB in preparation for returning to supine with CGA.  Pt remained semi-reclined in bed with all needs in reach.  Therapy Documentation Precautions:  Precautions Precautions: Fall Restrictions Weight Bearing Restrictions: Yes RLE Weight Bearing: Non weight bearing  Pain: Pain Assessment Pain Scale: 0-10 Pain Score: 2     Therapy/Group: Individual Therapy  Rosalio Loud 12/11/2020, 11:37 AM

## 2020-12-11 NOTE — Progress Notes (Signed)
Physical Therapy Session Note  Patient Details  Name: Andrea [name] MRN: [id number] Date of Birth: 09-12-1925  Today's Date: 12/11/2020 PT Individual Time: 4742-5956 and 1445-1526 PT Individual Time Calculation (min): 24 min and 41 min  Short Term Goals: Week 1:  PT Short Term Goal 1 (Week 1): pt will transfer supine<>sitting EOB with min A PT Short Term Goal 2 (Week 1): pt will transfer bed<>chair with LRAD and max of 1 PT Short Term Goal 3 (Week 1): pt will transfer sit<>stand with LRAD and max of 1  Skilled Therapeutic Interventions/Progress Updates:   Treatment Session 1 Received pt sitting in WC, pt agreeable to PT treatment, and reported pain in buttocks from sitting but declined any pain interventions. Session with emphasis on functional mobility/transfers, generalized strengthening, dynamic standing balance/coordination, and improved activity tolerance. Pt transferred sit<>stand with RW and max A +2 x 4 trials throughout session. Pt prefers to stand with both UEs on RW but did try with LUE pushing from [facility] armrest. Pt requires max cues for anterior weight shifting, upright posture/gaze, and R pelvic rotation but demonstrates good ability to maintain RLE NWB precautions with cues once upright. Pt shows improved postural alignment when using mirror for visual feedback. While standing pt performed x3 RLE marches but demonstrated increased posterior lean due to LOB. Pt also has tendency to fall posteriorly when sitting and requires cues to flex at hips. Pt requested to return to bed and transferred WC<>bed via SB to L with mod A +2 with cues for anterior weight shifting and head/hips relationship but pt able to maintain RLE NWB precautions without cues. Sit<>supine with max A of 1 with cues for technique. Scooted to [facility] with +2 assist. Concluded session with pt semi-reclined in bed, needs within reach, and bed alarm on.   Treatment Session 2 Received pt supine in bed asleep, upon wakening pt  agreeable to PT treatment, and did not state pain level during session. Session with emphasis on functional mobility/transfers, generalized strengthening, dynamic standing balance/coordination, and improved activity tolerance. Checked and pt found to be incontinent of urine. Doffed pants and dirty brief with total A and performed peri-care dependently. Donned clean brief and pants with total A +2. Pt transferred supine<>sitting EOB with min A with increased time. Sit<>stands from elevated bed with max A of 1 x 3 trials with second person stabilizing RW and blocking LLE from sliding forwards. Pt able to maintain RLE NWB precautions while standing. Sit<>supine with max A and scooted to HOB pulling on headboard with use of Trendelenburg bed position. Concluded session with pt semi-reclined in bed, needs within reach, and bed alarm on.   Therapy Documentation Precautions:  Precautions Precautions: Fall Restrictions Weight Bearing Restrictions: Yes RLE Weight Bearing: Non weight bearing  Therapy/Group: Individual Therapy Martin Majestic PT, DPT   12/11/2020, 7:24 AM

## 2020-12-12 NOTE — Patient Care Conference (Signed)
Inpatient RehabilitationTeam Conference and Plan of Care Update Date: 12/12/2020   Time: 13:43 PM    Patient Name: Andrea [name]      Medical Record Number: [id number]  Date of Birth: [birth year] Sex: Female         Room/Bed: 5C06C/5C06C-01 Payor Info: Payor: MEDICARE / Plan: MEDICARE PART A AND B / Product Type: *No Product type* /    Admit Date/Time:  12/05/2020  4:23 PM  Primary Diagnosis:  Femur fracture [facility])  Hospital Problems: Principal Problem:   Femur fracture (HCC) Active Problems:   Pressure injury of skin    Expected Discharge Date: Expected Discharge Date: 12/30/20  Team Members Present: Physician leading conference: Dr. Sula Soda Social Worker Present: Dossie Der, LCSW Nurse Present: Chana Bode, RN PT Present: Raechel Chute, PT OT Present: Roney Mans, OT     Current Status/Progress Goal Weekly Team Focus  Bowel/Bladder             Swallow/Nutrition/ Hydration             ADL's   Mod A UB bathing/dressing, Total assist to +2 LB bathing/dressing at bed level vs sit > stand.  Mod A +2 sit > stand with RW, Max A to +2 slide board L vs R.  Have begun introducing AE for LB bathing/dressing  Min A overall Supervision UB dressing and grooming  ADL retraining, transfers, sit > stand, AE use, adherence to WB precautions, and improved activity tolerance   Mobility   bed mobility max A, SB transfers, max A +2, sit<>stand with RW max A +2, unable to ambulate  min A overall, supervision for bed and WC mobility  functional mobility/transfers, generalized strengthening, dynamic standing balance/coordination, adherance to WB precautions, and imporved actiivty tolerance.   Communication             Safety/Cognition/ Behavioral Observations            Pain             Skin               Discharge Planning:  Will discuss how much physical care her friend can do or may need to begin looking a SNF until does not require as much care   Team  Discussion: Patient is struggeling; but making slow progress. Incontinent of bowel and bladder but more aware of weight bearing precautions than previous note. Stage 2 on buttocks more painful than surgical site. ROHO cushion in w/c and working on pressure relief and positioning. Requires cues for memory of strategies.   Patient on target to meet rehab goals: Currently needs max assist + 1 where last week requires max assist +2 overall. Able to complete slide-board transfer with + 2 assist; goes to the left better than right. Needs mod assist for upper body bathing and dressing and total assist for lower body care at the bed level. Requires mod assist +2; one person to assist with standing and another with clothes management/hygiene. Goals for discharge set at min assist for mobility and supervision for upper body and transfers and toileting.  *See Care Plan and progress notes for long and short-term goals.   Revisions to Treatment Plan:    Teaching Needs: Safety, WB precautions, skin care, pressure relief, medications, etc   Current Barriers to Discharge: Home enviroment access/layout, Wound care, Lack of/limited family support, and Weight bearing restrictions  Possible Resolutions to Barriers: Friend education Would benefit from SNF short term stay  Medical Summary Current Status: hip pain, sacral wound pain, hypotension, insomnia  Barriers to Discharge: Wound care;Medical stability  Barriers to Discharge Comments: hip pain, sacral wound pain, hypotension, insomnia Possible Resolutions to [employer] Focus: continue tylenol, wound care consulted, continue to monitor blood pressure, start melatonin at night   Continued Need for Acute Rehabilitation Level of Care: The patient requires daily medical management by a physician with specialized training in physical medicine and rehabilitation for the following reasons: Direction of a multidisciplinary physical rehabilitation program  to maximize functional independence : Yes Medical management of patient stability for increased activity during participation in an intensive rehabilitation regime.: Yes Analysis of laboratory values and/or radiology reports with any subsequent need for medication adjustment and/or medical intervention. : Yes   I attest that I was present, lead the team conference, and concur with the assessment and plan of the team.   Chana Bode B 12/12/2020, 1:50 PM

## 2020-12-12 NOTE — Progress Notes (Addendum)
PROGRESS NOTE   Subjective/Complaints: C/o pain in sacrum overnight- keeps repositioning- encouraged this to offload stage 2 pressure injury- discussed wound care with RN- offloading during the day She has no other complaints- has very positive attitude.   ROS: Patient denies fever, rash, sore throat, blurred vision, nausea, vomiting, diarrhea, cough, shortness of breath or chest pain, headache, or mood change.    Objective:   No results found. No results for input(s): WBC, HGB, HCT, PLT in the last 72 hours.  No results for input(s): NA, K, CL, CO2, GLUCOSE, BUN, CREATININE, CALCIUM in the last 72 hours.   Intake/Output Summary (Last 24 hours) at 12/12/2020 0940 Last data filed at 12/11/2020 1810 Gross per 24 hour  Intake 240 ml  Output --  Net 240 ml     Pressure Injury 12/05/20 Buttocks Left Stage 2 -  Partial thickness loss of dermis presenting as a shallow open injury with a red, pink wound bed without slough. A stage 2 measuring 1cmx1cm and surrounding skin is nonblaching. entire area measures 8.5cmx (Active)  12/05/20 1700  Location: Buttocks  Location Orientation: Left  Staging: Stage 2 -  Partial thickness loss of dermis presenting as a shallow open injury with a red, pink wound bed without slough.  Wound Description (Comments): A stage 2 measuring 1cmx1cm and surrounding skin is nonblaching. entire area measures 8.5cmx3.7cm.  Present on Admission: Yes    Physical Exam: Vital Signs Blood pressure (!) 132/55, pulse 72, temperature 97.8 F (36.6 C), resp. rate 16, height 5\' 4"  (1.626 m), weight 71.3 kg, SpO2 97 %. Gen: no distress, normal appearing HEENT: oral mucosa pink and moist, NCAT Cardio: Reg rate Chest: normal effort, normal rate of breathing Abd: soft, non-distended Ext: no edema  Psych: pleasant and cooperative, good awareness of weightbearing status Musculoskeletal:     Cervical back: Normal range  of motion.     Comments: Right thigh and knee swollen, bruised. Expectedly tender to palpation and attempts at PROM.  Skin:    Comments: Incision is dressed with scant blood beneath foam dressing. Left buttock wound, stage 2 Neurological:     Mental Status: She is alert and oriented to person, place, and time.     Comments: Patient is alert.  Good insight and awareness. Mild STM deficits.  No acute distress.  Makes eye contact with examiner.  Follows simple commands. HOH. Otherwise CN normal. No focal sensory loss in limbs. Motor 4+/5 UE bilaterally. RLE 1/5 d/t pain at hip and knee and 4/5 ADF/PF. LLE 2+ to 3-/5 HF, 3/5 KE and 4/5 ADF/PF. DTR's 1+ no focal limb ataxia or tremor appreciated while supine in bed     Assessment/Plan: 1. Functional deficits which require 3+ hours per day of interdisciplinary therapy in a comprehensive inpatient rehab setting. Physiatrist is providing close team supervision and 24 hour management of active medical problems listed below. Physiatrist and rehab team continue to assess barriers to discharge/monitor patient progress toward functional and medical goals  Care Tool:  Bathing    Body parts bathed by patient: Right arm, Left arm, Chest, Abdomen, Face   Body parts bathed by helper: Front perineal area, Buttocks, Right upper leg,  Left upper leg, Right lower leg, Left lower leg     Bathing assist Assist Level: Maximal Assistance - Patient 24 - 49%     Upper Body Dressing/Undressing Upper body dressing   What is the patient wearing?: Pull over shirt    Upper body assist Assist Level: Moderate Assistance - Patient 50 - 74%    Lower Body Dressing/Undressing Lower body dressing      What is the patient wearing?: Pants     Lower body assist Assist for lower body dressing: 2 Helpers     Toileting Toileting    Toileting assist Assist for toileting: Moderate Assistance - Patient 50 - 74%     Transfers Chair/bed transfer  Transfers assist      Chair/bed transfer assist level: 2 Helpers     Locomotion Ambulation   Ambulation assist   Ambulation activity did not occur: Safety/medical concerns          Walk 10 feet activity   Assist  Walk 10 feet activity did not occur: Safety/medical concerns (pain, weakness, decreased balance, poor adherance to RLE NWB precautions)        Walk 50 feet activity   Assist Walk 50 feet with 2 turns activity did not occur: Safety/medical concerns         Walk 150 feet activity   Assist Walk 150 feet activity did not occur: Safety/medical concerns         Walk 10 feet on uneven surface  activity   Assist Walk 10 feet on uneven surfaces activity did not occur: Safety/medical concerns         Wheelchair     Assist Is the patient using a wheelchair?: Yes Type of Wheelchair: Manual Wheelchair activity did not occur: Safety/medical concerns  Wheelchair assist level: Moderate Assistance - Patient 50 - 74% Max wheelchair distance: 100'    Wheelchair 50 feet with 2 turns activity    Assist    Wheelchair 50 feet with 2 turns activity did not occur: Safety/medical concerns   Assist Level: Moderate Assistance - Patient 50 - 74%   Wheelchair 150 feet activity     Assist  Wheelchair 150 feet activity did not occur: Safety/medical concerns       Blood pressure (!) 132/55, pulse 72, temperature 97.8 F (36.6 C), resp. rate 16, height 5\' 4"  (1.626 m), weight 71.3 kg, SpO2 97 %.  Medical Problem List and Plan: 1.  Right distal femur fracture with intercondylar extension secondary to fall.  Status post ORIF 12/02/2020.  Nonweightbearing             -patient may shower             -ELOS/Goals: 21-24 days, supervision to min assist goals  Continue CIR therapies including PT, OT- has not yet been able to walk.   -Interdisciplinary Team Conference today   2.  Impaired mobility: -DVT/anticoagulation: Vascular study reviewed and is negative.   Pharmaceutical: Continue Lovenox             -antiplatelet therapy: Aspirin 81 mg daily 3. Femur fracture/right knee pain: Continue Tylenol,  Hydrocodone as needed. Voltaren gel 2mg  QID  4. Mood: Provide emotional support             -antipsychotic agents: N/A 5. Neuropsych: This patient is capable of making decisions on her own behalf. 6. Stage 2 sacral pressure injury: offload q2H, wound care 7. Fluids/Electrolytes/Nutrition: Routine in and outs with follow-up chemistries 8.  Acute blood loss anemia.  Hgb stable- continue to monitor weekly 9.  Hyperlipidemia.  Lipitor 10.  Systolic BP elevated, diastolic BP soft:  discontinue Norvasc. Monitor with increased mobility 11.  Hypothyroidism.  Synthroid 12.  History of CVA with resulting gait abnormality.  Continue aspirin 13. Overweight BMI 26.98: provide counseling.  14. Urine with foul strong odor- patient is alert and oriented and bright with no urinary complaints, afebrile- no longer has odor, no sx.  15. Constipation: continue colace 100mg  BID 16. Insomnia: start melatonin 3mg  HS 17. Disposition: HFU scheduled. Need to assess how much support family can provide. D/c October 1st.   LOS: 7 days A FACE TO FACE EVALUATION WAS PERFORMED  Andrea [name] 12/12/2020, 9:40 AM

## 2020-12-12 NOTE — Progress Notes (Signed)
Physical Therapy Session Note  Patient Details  Name: Andrea [name] MRN: [id number] Date of Birth: 12/12/1925  Today's Date: 12/12/2020 PT Individual Time: 1100-1156 and 6433-2951  PT Individual Time Calculation (min): 56 min and 26 min  Short Term Goals: Week 1:  PT Short Term Goal 1 (Week 1): pt will transfer supine<>sitting EOB with min A PT Short Term Goal 2 (Week 1): pt will transfer bed<>chair with LRAD and max of 1 PT Short Term Goal 3 (Week 1): pt will transfer sit<>stand with LRAD and max of 1  Skilled Therapeutic Interventions/Progress Updates:   Treatment Session 1 Received pt L sidelying in bed getting pressure off buttocks, pt agreeable to PT treatment, and reported pain 5/10 in R hip. RN notified and present to administer pain medication. Session with emphasis on functional mobility/transfers, generalized strengthening, dynamic standing balance/coordination, adherence to WB precautions, and improved endurance with activity. Wound care RN present to inspect sacral wound. Per wound care RN, pt with unstagable wound and recommending Aquacel dressing re-applied daily; notified RN. Pt with mild BM smear in brief. Removed soiled brief and performed peri-care dependently. Rolled L/R with CGA and use of bedrails and donned clean brief and pants with max A +2. Pt transferred supine<>sitting EOB from flat bed with use of bedrails with supervision with increased time! Pt also able to scoot to EOB with supervision and increased time. Donned L shoe with total A and transferred sit<>stand with RW and max A of 1 while maintaining RLE NWB precautions x 3 trials. Pt required cues for upright posture/gaze and hip/trunk extension in standing and with excellent adherence to WB precautions. Pt then transferred bed<>WC to L and slightly downhill using slideboard and CGA +2 for safety with significantly increased time! Pt required mod A to reposition hips in [facility] for comfort and proper alignment. Concluded  session with pt sitting in WC for lunch, needs within reach, and chair pad alarm on.   Treatment Session 2 Received pt sitting in WC with NT present checking vitals, pt agreeable to PT treatment, and denied any pain during session. Session with emphasis on functional mobility/transfers, generalized strengthening, dynamic standing balance/coordination, and improved endurance with activity. Sit<>stands with RW and heavy max A of 1 x 2 trials; unable to stand for 3rd time due to fatigue. Pt required cues to scoot to edge of chair, place LLE underneath her, and for anterior weight shifting. While standing pt performed 2x5 marches with RLE but demonstrated increased posterior lean due to altered balance strategies. Pt then performed the following exercises sitting in [facility] with supervision and verbal/visual cues for technique:  -LAQ x10 bilaterally (decreased ROM on RLE) -hip flexion x10 bilaterally  -hip adduction pillow squeezes x10 Concluded session with pt sitting in WC, needs within reach, and chair pad alarm on awaiting OT session.   Therapy Documentation Precautions:  Precautions Precautions: Fall Restrictions Weight Bearing Restrictions: Yes RLE Weight Bearing: Non weight bearing  Therapy/Group: Individual Therapy Martin Majestic PT, DPT   12/12/2020, 7:35 AM

## 2020-12-12 NOTE — Progress Notes (Signed)
Physical Therapy Session Note  Patient Details  Name: Andrea [name] MRN: [id number] Date of Birth: May 13, 1925  Today's Date: 12/12/2020 PT Individual Time: 9323-5573 PT Individual Time Calculation (min): 59 min   Short Term Goals: Week 1:  PT Short Term Goal 1 (Week 1): pt will transfer supine<>sitting EOB with min A PT Short Term Goal 2 (Week 1): pt will transfer bed<>chair with LRAD and max of 1 PT Short Term Goal 3 (Week 1): pt will transfer sit<>stand with LRAD and max of 1  Skilled Therapeutic Interventions/Progress Updates:    Pt received supine in bed and agreeable to therapy session though reports she is having a "rough morning" stating she had a "night of chaos" because she "couldn't get fixed" to where she wasn't having pain on her tailbone - therapist placed a sign above pt's HOB to have nursing assist with rolling to off weight her sacrum.  Supine>sitting R EOB, HOB elevated and using bedrails, with CGA - once in sitting position requires max assist to scoot hips to EOB with max cuing for head/hips relationship - very small, ineffecient scoot technique. Once in sitting, reports sudden onset of nausea - requires supported sitting rest break EOB for symptoms to pass.  Reports need to have BM. L slide board transfer EOB>drop arm BSC with total assist to place board and cuing for weight shifting then max assist of 1 for scooting and +2 to stabilize board - continues to have small, inefficient scoots with poor motor planning on technique as pt not utilizing L LE effectively (also lacks some knee ROM to allow proper positioning of L LE for more leverage). Once on BSC attempted sit>stand to RW; however, pt unable to come to standing safely while maintaining R LE NWBing precautions therefore performed R/L lateral leans with mod assist from 1 while +2 provided dependent assist for brief management. Continent of BM and performed seated peri-care dependent assist. Pt noted to have sacral wound  therefore notified nursing staff to assess and place dressing. Performed lateral lean technique to place clean brief.  R slide board transfer drop arm BSC>EOB with +2 max assist to scoot in this direction with pt continuing to demo poor motor planning resulting in small, inefficient scoots. Sit>supine with mod assist for trunk descent and R LE management onto bed. Assisted pt to repositioning into L sidelying to off-weight sacrum with pillows therapeutically positioned to support R LE and maintain sidelying position - nurse notified. Pt left with needs in reach and bed alarm on.    Therapy Documentation Precautions:  Precautions Precautions: Fall Restrictions Weight Bearing Restrictions: Yes RLE Weight Bearing: Non weight bearing   Pain:  Reports some R LE pain but unrated and does not limit participation in therapy.   Therapy/Group: Individual Therapy  Ginny Forth , PT, DPT, NCS, CSRS 12/12/2020, 7:58 AM

## 2020-12-12 NOTE — Progress Notes (Signed)
Occupational Therapy Session Note  Patient Details  Name: Andrea [name] MRN: [id number] Date of Birth: [birth year]  Today's Date: 12/12/2020 OT Individual Time: 1345-1430 OT Individual Time Calculation (min): 45 min    Short Term Goals: Week 1:  OT Short Term Goal 1 (Week 1): Pt will complete bathing with mod assist OT Short Term Goal 2 (Week 1): Pt will complete LB dressing with max assist and AE as needed OT Short Term Goal 3 (Week 1): Pt will complete toilet transfer with max assist of one caregiver OT Short Term Goal 4 (Week 1): Pt will complete toileting with max assist  Skilled Therapeutic Interventions/Progress Updates:    Pt resting in w/c upon arrival. Pt states she has been up in w/c since before lunch. SB transfer w/c>bed with max A+2. Pt adhered to RLE NWB precautions during tranfser. Pt required max multimodal cues to lean forward during transfer. Discussed discharge planning. Pt will need assistance at discharge. Pt currently lives alone. BUE therex with 3# bar-chest presses 3x10 and stratight arm raises 3x10, biceps curls 3x10. Pt remained in bed, sidelying on Lt side for pressure relief. All needs within reach and bed alarm activated.   Therapy Documentation Precautions:  Precautions Precautions: Fall Restrictions Weight Bearing Restrictions: Yes RLE Weight Bearing: Non weight bearing    Pain: Pt denies pain this afternoon. Pt reports that her "bottom" is ok for now   Therapy/Group: Individual Therapy  Rich Brave 12/12/2020, 2:33 PM

## 2020-12-12 NOTE — Consult Note (Signed)
WOC Nurse Consult Note: Patient receiving care in [employer] [phone] Reason for Consult: Sacral wound Wound type: Evolving stage 2 PI on the coccyx Pressure Injury POA: Yes Measurement: 2.5 cm x 1.5 cm x 0.2 cm Wound bed: 75% yellow, 25% pink Drainage (amount, consistency, odor)  Periwound: pink/moist Dressing procedure/placement/frequency: Clean the sacral area with no rinse cleanser, pat dry and apply a small piece of Aquacel Advantage [employer] [phone]) over the open area on the coccyx and secure with sacral foam dressing. Change Aquacel daily. Foam dressing may be used up to 3 days or PRN soiling.   Pressure Injury Prevention Bundle May use any that apply to this patient. Support surfaces (air mattress) chair cushion [employer] # [phone]) Heel offloading boots [employer] # [phone]) Turning and Positioning  Measures to reduce shear (draw sheet, knees up) Skin protection Products (Foam dressing) Moisture management products (Critic-Aid Barrier Cream (Purple top) Sween moisturizing lotion (Pink top in clean supply) Nutrition Management Protection for Medical Devices Routine Skin Assessment   Monitor the wound area(s) for worsening of condition such as: Signs/symptoms of infection, increase in size, development of or worsening of odor, development of pain, or increased pain at the affected locations.   Notify the medical team if any of these develop.  Thank you for the consult. WOC nurse will not follow at this time.   Please re-consult the WOC team if needed.  Renaldo Reel Katrinka Blazing, MSN, RN, CMSRN, Angus Seller, [facility] Wound Treatment Associate Pager [phone]

## 2020-12-12 NOTE — Progress Notes (Signed)
Patient ID: Andrea [name], female   DOB: [birth year], 87 y.o.   MRN: [id number]  Met with pt to discuss team conference goals of supervision-min assist level and target discharge of 10/1. Discussed her talking with her friends who will be assisting her and seeing if this is something they can manage. She will do this tonight and let worker know. She is still hopeful she will do better than the goals and not need much assist at discharge. Discussed alternatives of hired assist and  short term NHP. She is aware of these and hopes she will not need to do either of these. Will see tomorrow and work on safest option for pt.

## 2020-12-13 NOTE — Progress Notes (Signed)
Occupational Therapy Weekly Progress Note  Patient Details  Name: Andrea [name] MRN: [id number] Date of Birth: [birth year]  Beginning of progress report period: December 06, 2020 End of progress report period: December 13, 2020  Today's Date: 12/13/2020 OT Individual Time: 9407-6808 OT Individual Time Calculation (min): 57 min    Patient has met 2 of 4 short term goals.  Pt is making steady progress towards goals.  Pt is able to engage in bathing and dressing with use of AE to increase independence with LB bathing and dressing. Have begun educating on use of AE with pt requiring increased cues for recall of use of AE due to decreased recall.  Pt is able to complete LB dressing with mod-max assist at bed level and with lateral leans, but continues to require +2 for sit >stand for clothing management.  Pt is able to complete slide board transfer with mod assist with +2 for safety.  Pt continues to benefit from multimodal cues for anterior weight shifting as needed for sit > stand and slide board transfers.  Pt to confirm if she will have friends to assist upon d/c home vs short term SNF stay due to pt living alone at baseline.  Patient continues to demonstrate the following deficits:  muscle weakness and muscle joint tightness, decreased cardiorespiratoy endurance, decreased memory, and decreased sitting balance, decreased standing balance, decreased balance strategies, and difficulty maintaining precautions and therefore will continue to benefit from skilled OT intervention to enhance overall performance with BADL and Reduce care partner burden.  Patient progressing toward long term goals..  Continue plan of care.  OT Short Term Goals Week 1:  OT Short Term Goal 1 (Week 1): Pt will complete bathing with mod assist OT Short Term Goal 1 - Progress (Week 1): Met OT Short Term Goal 2 (Week 1): Pt will complete LB dressing with max assist and AE as needed OT Short Term Goal 2 - Progress (Week 1):  Met OT Short Term Goal 3 (Week 1): Pt will complete toilet transfer with max assist of one caregiver OT Short Term Goal 3 - Progress (Week 1): Progressing toward goal OT Short Term Goal 4 (Week 1): Pt will complete toileting with max assist OT Short Term Goal 4 - Progress (Week 1): Progressing toward goal Week 2:  OT Short Term Goal 1 (Week 2): Pt will complete toilet transfer with max assist of one caregiver OT Short Term Goal 2 (Week 2): Pt will complete toileting with max assist OT Short Term Goal 3 (Week 2): Pt will complete LB dressing with mod assist with AE as needed OT Short Term Goal 4 (Week 2): Pt will complete bathing with min assist with AE as needed  Skilled Therapeutic Interventions/Progress Updates:    Treatment session with focus on self-care retraining and functional mobility.  Pt received supine in bed asleep, but easily aroused to name. Pt agreeable to bathing/dressing at bed level progressing to sitting EOB.  Pt rolling R and L CGA to allow therapist to wash buttocks.  Pt able to completed bed mobility to sitting at EOB with CGA and increased time. Therapist re-educated pt on use of AE for LB bathing and dressing as pt with no recall from education with LB dressing during previous session. Pt required hand over hand assist to utilize reacher for LB dressing.  Completed sit > stand mod assist +2 with multimodal cues for anterior weight shift and maintaining NWB through RLE during transitional movement.  Pt completed slide board transfer  with total assist to place board and then mod assist of 1 with +2 present for safety.  Pt setup to engage in eating while discussing d/c disposition and need to ensure assistance at home if pt to d/c home.  Pt remained upright in w/c with all needs in reach.  Therapy Documentation Precautions:  Precautions Precautions: Fall Restrictions Weight Bearing Restrictions: Yes RLE Weight Bearing: Non weight bearing General:   Vital Signs: Therapy  Vitals Temp: 98.1 F (36.7 C) Temp Source: Oral Pulse Rate: 71 Resp: 18 BP: 137/69 Patient Position (if appropriate): Lying Oxygen Therapy SpO2: 97 % O2 Device: Room Air Pain:   ADL: ADL Upper Body Bathing: Minimal assistance Where Assessed-Upper Body Bathing: Edge of bed Lower Body Bathing: Dependent (+2) Where Assessed-Lower Body Bathing: Bed level Upper Body Dressing: Moderate assistance Where Assessed-Upper Body Dressing: Edge of bed Lower Body Dressing: Dependent (+2) Where Assessed-Lower Body Dressing: Bed level Toileting: Dependent (+2) Where Assessed-Toileting: Bed level Toilet Transfer: Unable to assess Vision   Perception    Praxis   Exercises:   Other Treatments:     Therapy/Group: Individual Therapy  Simonne Come 12/13/2020, 7:24 AM

## 2020-12-13 NOTE — Progress Notes (Signed)
Physical Therapy Weekly Progress Note  Patient Details  Name: Andrea [name] MRN: [id number] Date of Birth: 1925/05/01  Beginning of progress report period: December 06, 2020 End of progress report period: December 13, 2020  Patient has met 2 of 3 short term goals. Pt demonstrates slow progress towards long term goals. Pt is currently able to roll L/R with CGA/supervision using bedrails, transfer supine<>sit with CGA/supervision and increased time, but requires mod/max A to transfer sit<>supine. Pt fluctuates with transfer status but has been able to perform slideboard transfers to L with CGA +2 but can require up to max +2 when transferring to the R. Pt is now able to stand with RW and max A of 1 and demonstrates improvements in adherence to RLE NWB precautions. Pt continues to be limited by weakness, deconditioning, fatigue, memory deficits, and altered balance strategies due to NWB precautions.   Patient continues to demonstrate the following deficits muscle weakness and muscle joint tightness, decreased cardiorespiratoy endurance, decreased coordination and decreased motor planning, decreased memory, and decreased standing balance, decreased postural control, hemiplegia, decreased balance strategies, and difficulty maintaining precautions and therefore will continue to benefit from skilled PT intervention to increase functional independence with mobility.  Patient progressing toward long term goals..  Continue plan of care.  PT Short Term Goals Week 1:  PT Short Term Goal 1 (Week 1): pt will transfer supine<>sitting EOB with min A PT Short Term Goal 1 - Progress (Week 1): Met PT Short Term Goal 2 (Week 1): pt will transfer bed<>chair with LRAD and max of 1 PT Short Term Goal 2 - Progress (Week 1): Progressing toward goal PT Short Term Goal 3 (Week 1): pt will transfer sit<>stand with LRAD and max of 1 PT Short Term Goal 3 - Progress (Week 1): Met Week 2:  PT Short Term Goal 1 (Week 2): pt  will transfer sit<>supine with min A PT Short Term Goal 2 (Week 2): pt will transfer sit<>stand with LRAD and mod A PT Short Term Goal 3 (Week 2): pt will transfer bed<>chair with LRAD and mod A of 1  Skilled Therapeutic Interventions/Progress Updates:  Ambulation/gait training;Discharge planning;Functional mobility training;Psychosocial support;Therapeutic Activities;Visual/perceptual remediation/compensation;Balance/vestibular training;Disease management/prevention;Neuromuscular re-education;Skin care/wound management;Therapeutic Exercise;Wheelchair propulsion/positioning;Cognitive remediation/compensation;DME/adaptive equipment instruction;Pain management;Splinting/orthotics;UE/LE Strength taining/ROM;Community reintegration;Functional electrical stimulation;Patient/family education;Stair training;UE/LE Coordination activities   Therapy Documentation Precautions:  Precautions Precautions: Fall Restrictions Weight Bearing Restrictions: Yes RLE Weight Bearing: Non weight bearing  Therapy/Group: Individual Therapy Alfonse Alpers PT, DPT   12/13/2020, 7:21 AM

## 2020-12-13 NOTE — Progress Notes (Signed)
Physical Therapy Session Note  Patient Details  Name: Andrea [name] MRN: [id number] Date of Birth: 05-14-1925  Today's Date: 12/13/2020 PT Individual Time: 9390-3009 and 1100-1141 and 2330-0762 PT Individual Time Calculation (min): 28 min and 41 min and 53 min PT Missed Time: 19 minutes PT Missed Time Reason: Fatigue  Short Term Goals: Week 1:  PT Short Term Goal 1 (Week 1): pt will transfer supine<>sitting EOB with min A PT Short Term Goal 1 - Progress (Week 1): Met PT Short Term Goal 2 (Week 1): pt will transfer bed<>chair with LRAD and max of 1 PT Short Term Goal 2 - Progress (Week 1): Progressing toward goal PT Short Term Goal 3 (Week 1): pt will transfer sit<>stand with LRAD and max of 1 PT Short Term Goal 3 - Progress (Week 1): Met Week 2:  PT Short Term Goal 1 (Week 2): pt will transfer sit<>supine with min A PT Short Term Goal 2 (Week 2): pt will transfer sit<>stand with LRAD and mod A PT Short Term Goal 3 (Week 2): pt will transfer bed<>chair with LRAD and mod A of 1  Skilled Therapeutic Interventions/Progress Updates:  Treatment Session 1 Received pt sitting in WC, pt agreeable to PT treatment, and denied any pain during session. Pt reported "tossing and turning" all night due to difficulty getting comfortable from discomfort on buttocks and difficulty "getting her head straight" this morning but unable to elaborate. Session with emphasis on functional mobility, UE/LE strengthening, and sequencing with WC mobility. Donned L shoe with max A and pt performed WC mobility 42f x 1, 138fx 1, and 1045f 1 using BUE and supervision with significantly increased time. Pt with tendency to veer towards L and demonstrated short, inefficient strokes requiring mod/max multimodal cues for propulsion technique/sequencing. Pt then performed the following exercises sitting in WC [facility] supervision and verbal/visual cues for technique:  -hip flexion x15 bilaterally -LAQ x15 bilaterally (decreased R  knee extension ROM) -hip adduction pillow squeezes x15 Concluded session with pt sitting in WC, needs within reach, and chair pad alarm on.   Treatment Session 2 Received pt sitting in WC, pt agreeable to PT treatment, and reported discomfort in buttocks from sitting - reviewed pressure relief strategies. Session with emphasis on functional mobility/transfers, generalized strengthening, dynamic standing balance/coordination, and improved activity tolerance. Pt [facility] for time management/energy conservation purposes. Pt performed x 3 slideboard transfers throughout session. Of note, pt able to transfer to L with min A of 1 but requires mod/max +2 when transferring to R. Pt also requires max multimodal cues to push through LLE, for anterior weight shifting, hand placement on board, and head/hips relationship when transferring to R. While sitting EOM, worked on reciprocal scooting forward with max cues for sequencing as pt demonstrates short, ineffective scoots. Therapist attempted to demonstrate technique to pt (providing max A for technique) but pt stated "I'm just not me" referring to difficulty focusing and comprehending what therapist was asking. Worked on sit<>stands with RW and max A of 1 x 2 trials to fatigue using mirror for visual feedback. Pt required cues for anterior weight shifting, upright posture/gaze, and cervical/thoracic and hip extension. While standing pt performed 2x10 hip abduction on RLE. Upon trial 3 of standing, pt unable to further initiate movement stating  "my brain is tired" and "I don't know what's wrong with me". Pt requested to lie down and transferred sit<>supine with mod A for RLE management. Rolled onto L side  with CGA and positioned pillows around pt for comfort and for pressure relief. Concluded session with pt L sidelying in bed, needs within reach, and bed alarm on. 19 minutes missed of skilled physical therapy due to fatigue.    Treatment Session 3 Received pt supine in bed asleep with lunch tray untouched. Upon wakening, pt agreeable to PT treatment, and reported 5/10 pain in R hip. RN notified and present to administer medication. Session with emphasis on functional mobility, dynamic sitting balance/coordination, generalized strengthening, and improved activity tolerance. Pt transferred supine<>sitting EOB from flat bed with use of bedrails and supervision. Worked on dynamic sitting balance eating lunch for ~20 minutes with supervision without foot support and occasional posterior lean, but able to self-correct. Pt ate entire egg salad sandwich then transferred sit<>supine with mod A for BLE management to work on leg exercises. Pt performed the following exercises supine in bed with supervision and verbal cues for technique: -hip abduction 2x15 bilaterally  -SLR 2x10 bilaterally  -hip adduction pillow squeezes 2x15 -marches on LLE 2x15 -heel slides on RLE 2x10 - decreased knee flexion ROM  (~45 degrees) Concluded session with pt sidelying in bed, needs within reach, and bed alarm on. Pillows placed for comfort and for pressure relief. Pt very appreciative and reported feeling much better at end of session.   Therapy Documentation Precautions:  Precautions Precautions: Fall Restrictions Weight Bearing Restrictions: Yes RLE Weight Bearing: Non weight bearing  Therapy/Group: Individual Therapy Alfonse Alpers PT, DPT   12/13/2020, 7:25 AM

## 2020-12-13 NOTE — Progress Notes (Signed)
PROGRESS NOTE   Subjective/Complaints: No complaints this morning Appreciate wound care consultation regarding sacrum  ROS: Patient denies fever, rash, sore throat, blurred vision, nausea, vomiting, diarrhea, cough, shortness of breath or chest pain, headache, or mood change.    Objective:   No results found. No results for input(s): WBC, HGB, HCT, PLT in the last 72 hours.  No results for input(s): NA, K, CL, CO2, GLUCOSE, BUN, CREATININE, CALCIUM in the last 72 hours.   Intake/Output Summary (Last 24 hours) at 12/13/2020 1038 Last data filed at 12/13/2020 0830 Gross per 24 hour  Intake 300 ml  Output --  Net 300 ml     Pressure Injury 12/05/20 Buttocks Left Stage 2 -  Partial thickness loss of dermis presenting as a shallow open injury with a red, pink wound bed without slough. A stage 2 measuring 1cmx1cm and surrounding skin is nonblaching. entire area measures 8.5cmx (Active)  12/05/20 1700  Location: Buttocks  Location Orientation: Left  Staging: Stage 2 -  Partial thickness loss of dermis presenting as a shallow open injury with a red, pink wound bed without slough.  Wound Description (Comments): A stage 2 measuring 1cmx1cm and surrounding skin is nonblaching. entire area measures 8.5cmx3.7cm.  Present on Admission: Yes    Physical Exam: Vital Signs Blood pressure 137/69, pulse 71, temperature 98.1 F (36.7 C), temperature source Oral, resp. rate 18, height 5\' 4"  (1.626 m), weight 70.4 kg, SpO2 97 %. Gen: no distress, normal appearing HEENT: oral mucosa pink and moist, NCAT Cardio: Reg rate Chest: normal effort, normal rate of breathing Abd: soft, non-distended Ext: no edema  Psych: pleasant and cooperative, good awareness of weightbearing status Musculoskeletal:     Cervical back: Normal range of motion.     Comments: Right thigh and knee swollen, bruised. Expectedly tender to palpation and attempts at  PROM.  Skin:    Comments: Incision is dressed with scant blood beneath foam dressing. Left buttock wound, stage 2 Neurological:     Mental Status: She is alert and oriented to person, place, and time.     Comments: Patient is alert.  Good insight and awareness. Mild STM deficits.  No acute distress.  Makes eye contact with examiner.  Follows simple commands. HOH. Otherwise CN normal. No focal sensory loss in limbs. Motor 4+/5 UE bilaterally. RLE 1/5 d/t pain at hip and knee and 4/5 ADF/PF. LLE 2+ to 3-/5 HF, 3/5 KE and 4/5 ADF/PF. DTR's 1+ no focal limb ataxia or tremor appreciated while supine in bed     Assessment/Plan: 1. Functional deficits which require 3+ hours per day of interdisciplinary therapy in a comprehensive inpatient rehab setting. Physiatrist is providing close team supervision and 24 hour management of active medical problems listed below. Physiatrist and rehab team continue to assess barriers to discharge/monitor patient progress toward functional and medical goals  Care Tool:  Bathing    Body parts bathed by patient: Right arm, Left arm, Chest, Abdomen, Face, Right upper leg, Left upper leg   Body parts bathed by helper: Front perineal area, Buttocks, Right lower leg, Left lower leg     Bathing assist Assist Level: Moderate Assistance - Patient 50 -  74%     Upper Body Dressing/Undressing Upper body dressing   What is the patient wearing?: Pull over shirt    Upper body assist Assist Level: Moderate Assistance - Patient 50 - 74%    Lower Body Dressing/Undressing Lower body dressing      What is the patient wearing?: Pants     Lower body assist Assist for lower body dressing: Maximal Assistance - Patient 25 - 49%     Toileting Toileting    Toileting assist Assist for toileting: Moderate Assistance - Patient 50 - 74%     Transfers Chair/bed transfer  Transfers assist     Chair/bed transfer assist level: 2 Helpers Chair/bed transfer assistive  device: Sliding board   Locomotion Ambulation   Ambulation assist   Ambulation activity did not occur: Safety/medical concerns          Walk 10 feet activity   Assist  Walk 10 feet activity did not occur: Safety/medical concerns (pain, weakness, decreased balance, poor adherance to RLE NWB precautions)        Walk 50 feet activity   Assist Walk 50 feet with 2 turns activity did not occur: Safety/medical concerns         Walk 150 feet activity   Assist Walk 150 feet activity did not occur: Safety/medical concerns         Walk 10 feet on uneven surface  activity   Assist Walk 10 feet on uneven surfaces activity did not occur: Safety/medical concerns         Wheelchair     Assist Is the patient using a wheelchair?: Yes Type of Wheelchair: Manual Wheelchair activity did not occur: Safety/medical concerns  Wheelchair assist level: Moderate Assistance - Patient 50 - 74% Max wheelchair distance: 100'    Wheelchair 50 feet with 2 turns activity    Assist    Wheelchair 50 feet with 2 turns activity did not occur: Safety/medical concerns   Assist Level: Moderate Assistance - Patient 50 - 74%   Wheelchair 150 feet activity     Assist  Wheelchair 150 feet activity did not occur: Safety/medical concerns       Blood pressure 137/69, pulse 71, temperature 98.1 F (36.7 C), temperature source Oral, resp. rate 18, height 5\' 4"  (1.626 m), weight 70.4 kg, SpO2 97 %.  Medical Problem List and Plan: 1.  Right distal femur fracture with intercondylar extension secondary to fall.  Status post ORIF 12/02/2020.  Nonweightbearing             -patient may shower             -ELOS/Goals: 21-24 days, supervision to min assist goals  Continue CIR therapies including PT, OT- has not yet been able to walk.  2.  Impaired mobility: -DVT/anticoagulation: Vascular study reviewed and is negative.  Pharmaceutical: Continue Lovenox             -antiplatelet  therapy: Aspirin 81 mg daily 3. Femur fracture/right knee pain: Continue Tylenol,  Hydrocodone as needed. Voltaren gel 2mg  QID  4. Mood: Provide emotional support             -antipsychotic agents: N/A 5. Neuropsych: This patient is capable of making decisions on her own behalf. 6. Stage 2 sacral pressure injury: offload q2H, wound care 7. Fluids/Electrolytes/Nutrition: Routine in and outs with follow-up chemistries 8.  Acute blood loss anemia.  Hgb stable- continue to monitor weekly 9.  Hyperlipidemia.  Lipitor 10.  Systolic BP elevated, diastolic BP  soft:  discontinue Norvasc. Monitor with increased mobility 11.  Hypothyroidism.  Synthroid 12.  History of CVA with resulting gait abnormality.  Continue aspirin 13. Overweight BMI 26.98: provide counseling.  14. Urine with foul strong odor- patient is alert and oriented and bright with no urinary complaints, afebrile- no longer has odor, no sx.  15. Constipation: continue colace 100mg  BID 16. Insomnia: continue melatonin 3mg  HS 17. Disposition: HFU scheduled. Need to assess how much support family can provide. D/c October 1st.   LOS: 8 days A FACE TO FACE EVALUATION WAS PERFORMED  Andrea [name] 12/13/2020, 10:38 AM

## 2020-12-14 MED ORDER — ACETAMINOPHEN 325 MG PO TABS
650.0000 mg | ORAL_TABLET | Freq: Three times a day (TID) | ORAL | Status: DC
  Administered 2020-12-14 – 2020-12-25 (×33): 650 mg via ORAL
  Filled 2020-12-14 (×33): qty 2

## 2020-12-14 NOTE — Progress Notes (Signed)
Occupational Therapy Session Note  Patient Details  Name: Andrea [name] MRN: [id number] Date of Birth: [birth year]  Today's Date: 12/15/2020 OT Individual Time: 0762-2633 OT Individual Time Calculation (min): 28 min    Short Term Goals: Week 1:  OT Short Term Goal 1 (Week 1): Pt will complete bathing with mod assist OT Short Term Goal 1 - Progress (Week 1): Met OT Short Term Goal 2 (Week 1): Pt will complete LB dressing with max assist and AE as needed OT Short Term Goal 2 - Progress (Week 1): Met OT Short Term Goal 3 (Week 1): Pt will complete toilet transfer with max assist of one caregiver OT Short Term Goal 3 - Progress (Week 1): Progressing toward goal OT Short Term Goal 4 (Week 1): Pt will complete toileting with max assist OT Short Term Goal 4 - Progress (Week 1): Progressing toward goal  Skilled Therapeutic Interventions/Progress Updates:    Pt greeted in the w/c, appearing bright in spirits, reportedly premedicated for pain. ADL needs were presently met. Therefore session focus was placed on UB strengthening in prep for functional transfer training at sit<stand level. Issued her a yellow theraband and guided pt through B UE exercises x10 reps 2 sets each exercise. Pt required manual cues at times for carryover of proper technique due to hearing impairments. At end of session pt remained sitting up with all needs within reach, anticipating next therapist for physical therapy session.   Therapy Documentation Precautions:  Precautions Precautions: Fall Restrictions Weight Bearing Restrictions: Yes RLE Weight Bearing: Non weight bearing  ADL: ADL Upper Body Bathing: Minimal assistance Where Assessed-Upper Body Bathing: Edge of bed Lower Body Bathing: Dependent (+2) Where Assessed-Lower Body Bathing: Bed level Upper Body Dressing: Moderate assistance Where Assessed-Upper Body Dressing: Edge of bed Lower Body Dressing: Dependent (+2) Where Assessed-Lower Body Dressing: Bed  level Toileting: Dependent (+2) Where Assessed-Toileting: Bed level Toilet Transfer: Unable to assess      Therapy/Group: Individual Therapy  Marquel Spoto A Kue Fox 12/15/2020, 12:23 PM

## 2020-12-14 NOTE — Plan of Care (Signed)
  Problem: Consults Goal: RH GENERAL PATIENT EDUCATION Description: See Patient Education module for education specifics. Outcome: Progressing   Problem: RH BOWEL ELIMINATION Goal: RH STG MANAGE BOWEL WITH ASSISTANCE Description: STG Manage Bowel with mod I Assistance. Outcome: Progressing   Problem: RH BLADDER ELIMINATION Goal: RH STG MANAGE BLADDER WITH ASSISTANCE Description: STG Manage Bladder With  mod I Assistance Outcome: Progressing   Problem: RH SKIN INTEGRITY Goal: RH STG MAINTAIN SKIN INTEGRITY WITH ASSISTANCE Description: STG Maintain Skin Integrity With min Assistance. Outcome: Progressing   Problem: RH SKIN INTEGRITY Goal: RH STG ABLE TO PERFORM INCISION/WOUND CARE W/ASSISTANCE Description: STG Able To Perform Incision/Wound Care With min Assistance. Outcome: Progressing   Problem: RH PAIN MANAGEMENT Goal: RH STG PAIN MANAGED AT OR BELOW PT'S PAIN GOAL Description: At or below level 4 Outcome: Progressing   Problem: RH PAIN MANAGEMENT Goal: RH STG PAIN MANAGED AT OR BELOW PT'S PAIN GOAL Description: At or below level 4 Outcome: Progressing

## 2020-12-14 NOTE — Progress Notes (Signed)
PROGRESS NOTE   Subjective/Complaints: Andrea [name] was experiencing more hip pain yesterday- discussed scheduling her tylenol 650mg  TID and Andrea [name] is agreeable.   ROS: Andrea [name] denies fever, rash, sore throat, blurred vision, nausea, vomiting, diarrhea, cough, shortness of breath or chest pain, headache, or mood change. +hip pain   Objective:   No results found. No results for input(s): WBC, HGB, HCT, PLT in the last 72 hours.  No results for input(s): NA, K, CL, CO2, GLUCOSE, BUN, CREATININE, CALCIUM in the last 72 hours.   Intake/Output Summary (Last 24 hours) at 12/14/2020 1006 Last data filed at 12/14/2020 0900 Gross per 24 hour  Intake 480 ml  Output --  Net 480 ml     Pressure Injury 12/05/20 Buttocks Left Stage 2 -  Partial thickness loss of dermis presenting as a shallow open injury with a red, pink wound bed without slough. A stage 2 measuring 1cmx1cm and surrounding skin is nonblaching. entire area measures 8.5cmx (Active)  12/05/20 1700  Location: Buttocks  Location Orientation: Left  Staging: Stage 2 -  Partial thickness loss of dermis presenting as a shallow open injury with a red, pink wound bed without slough.  Wound Description (Comments): A stage 2 measuring 1cmx1cm and surrounding skin is nonblaching. entire area measures 8.5cmx3.7cm.  Present on Admission: Yes    Physical Exam: Vital Signs Blood pressure 136/61, pulse 64, temperature (!) 97.5 F (36.4 C), temperature source Oral, resp. rate 18, height 5\' 4"  (1.626 m), weight 70.4 kg, SpO2 97 %. Gen: no distress, normal appearing HEENT: oral mucosa pink and moist, NCAT Cardio: Reg rate Chest: normal effort, normal rate of breathing Abd: soft, non-distended Ext: no edema  Psych: pleasant and cooperative, good awareness of weightbearing status Musculoskeletal:     Cervical back: Normal range of motion.     Comments: Right thigh and knee swollen,  bruised. Expectedly tender to palpation and attempts at PROM.  Skin:    Comments: Incision is dressed with scant blood beneath foam dressing. Left buttock wound, stage 2 Neurological:     Mental Status: Andrea [name] is alert and oriented to person, place, and time.     Comments: Andrea [name] is alert.  Good insight and awareness. Mild STM deficits.  No acute distress.  Makes eye contact with examiner.  Follows simple commands. HOH. Otherwise CN normal. No focal sensory loss in limbs. Motor 4+/5 UE bilaterally. RLE 1/5 d/t pain at hip and knee and 4/5 ADF/PF. LLE 2+ to 3-/5 HF, 3/5 KE and 4/5 ADF/PF. DTR's 1+ no focal limb ataxia or tremor appreciated while supine in bed     Assessment/Plan: 1. Functional deficits which require 3+ hours per day of interdisciplinary therapy in a comprehensive inpatient rehab setting. Physiatrist is providing close team supervision and 24 hour management of active medical problems listed below. Physiatrist and rehab team continue to assess barriers to discharge/monitor Andrea [name] progress toward functional and medical goals  Care Tool:  Bathing    Body parts bathed by Andrea [name]: Right arm, Left arm, Chest, Abdomen, Right upper leg, Left upper leg, Right lower leg, Left lower leg, Face   Body parts bathed by helper: Front perineal area, Buttocks, Right lower  leg, Left lower leg     Bathing assist Assist Level: Moderate Assistance - Andrea [name] 50 - 74%     Upper Body Dressing/Undressing Upper body dressing   What is the Andrea [name] wearing?: Pull over shirt    Upper body assist Assist Level: Minimal Assistance - Andrea [name] > 75%    Lower Body Dressing/Undressing Lower body dressing      What is the Andrea [name] wearing?: Pants     Lower body assist Assist for lower body dressing: Maximal Assistance - Andrea [name] 25 - 49%     Toileting Toileting    Toileting assist Assist for toileting: Moderate Assistance - Andrea [name] 50 - 74%     Transfers Chair/bed transfer  Transfers  assist     Chair/bed transfer assist level: 2 Helpers Chair/bed transfer assistive device: Sliding board   Locomotion Ambulation   Ambulation assist   Ambulation activity did not occur: Safety/medical concerns          Walk 10 feet activity   Assist  Walk 10 feet activity did not occur: Safety/medical concerns (pain, weakness, decreased balance, poor adherance to RLE NWB precautions)        Walk 50 feet activity   Assist Walk 50 feet with 2 turns activity did not occur: Safety/medical concerns         Walk 150 feet activity   Assist Walk 150 feet activity did not occur: Safety/medical concerns         Walk 10 feet on uneven surface  activity   Assist Walk 10 feet on uneven surfaces activity did not occur: Safety/medical concerns         Wheelchair     Assist Is the Andrea [name] using a wheelchair?: Yes Type of Wheelchair: Manual Wheelchair activity did not occur: Safety/medical concerns  Wheelchair assist level: Supervision/Verbal cueing Max wheelchair distance: 61ft    Wheelchair 50 feet with 2 turns activity    Assist    Wheelchair 50 feet with 2 turns activity did not occur: Safety/medical concerns   Assist Level: Moderate Assistance - Andrea [name] 50 - 74%   Wheelchair 150 feet activity     Assist  Wheelchair 150 feet activity did not occur: Safety/medical concerns       Blood pressure 136/61, pulse 64, temperature (!) 97.5 F (36.4 C), temperature source Oral, resp. rate 18, height 5\' 4"  (1.626 m), weight 70.4 kg, SpO2 97 %.  Medical Problem List and Plan: 1.  Right distal femur fracture with intercondylar extension secondary to fall.  Status post ORIF 12/02/2020.  Nonweightbearing             -Andrea [name] may shower             -ELOS/Goals: 21-24 days, supervision to min assist goals  Continue CIR therapies including PT, OT- has not yet been able to walk.  2.  Impaired mobility: -DVT/anticoagulation: Vascular study reviewed and  is negative.  Pharmaceutical: Continue Lovenox             -antiplatelet therapy: Aspirin 81 mg daily 3. Femur fracture/right knee pain: Scheduled Tylenol 650mg  TID. Hydrocodone as needed. Voltaren gel 2mg  QID  4. Mood: Provide emotional support             -antipsychotic agents: N/A 5. Neuropsych: This Andrea [name] is capable of making decisions on her own behalf. 6. Stage 2 sacral pressure injury: offload q2H, wound care 7. Fluids/Electrolytes/Nutrition: Routine in and outs with follow-up chemistries 8.  Acute blood loss anemia.  Hgb stable- continue to  monitor weekly 9.  Hyperlipidemia.  Continue Lipitor 10.  Systolic BP elevated, diastolic BP soft:  discontinue Norvasc. Monitor with increased mobility 11.  Hypothyroidism.  Synthroid 12.  History of CVA with resulting gait abnormality.  Continue aspirin 13. Overweight BMI 26.98: provide counseling.  14. Urine with foul strong odor- Andrea [name] is alert and oriented and bright with no urinary complaints, afebrile- no longer has odor, no sx.  15. Constipation: continue colace 100mg  BID 16. Insomnia: continue melatonin 3mg  HS 17. Disposition: HFU scheduled. Need to assess how much support family can provide. D/c October 1st.   LOS: 9 days A FACE TO FACE EVALUATION WAS PERFORMED  Jefferey Lippmann P Lynix Bonine 12/14/2020, 10:06 AM

## 2020-12-14 NOTE — Progress Notes (Signed)
Physical Therapy Session Note  Patient Details  Name: Andrea [name] MRN: [id number] Date of Birth: 06/08/1925  Today's Date: 12/14/2020 PT Individual Time: 4098-1191 and 4782-9562 PT Individual Time Calculation (min): 68 min and 60 min  Short Term Goals: Week 1:  PT Short Term Goal 1 (Week 1): pt will transfer supine<>sitting EOB with min A PT Short Term Goal 1 - Progress (Week 1): Met PT Short Term Goal 2 (Week 1): pt will transfer bed<>chair with LRAD and max of 1 PT Short Term Goal 2 - Progress (Week 1): Progressing toward goal PT Short Term Goal 3 (Week 1): pt will transfer sit<>stand with LRAD and max of 1 PT Short Term Goal 3 - Progress (Week 1): Met Week 2:  PT Short Term Goal 1 (Week 2): pt will transfer sit<>supine with min A PT Short Term Goal 2 (Week 2): pt will transfer sit<>stand with LRAD and mod A PT Short Term Goal 3 (Week 2): pt will transfer bed<>chair with LRAD and mod A of 1  Skilled Therapeutic Interventions/Progress Updates:   Treatment Session 1 Received pt sitting in WC, pt agreeable to PT treatment, and reported discomfort on tailbone from sitting. Pt reports that her tailbone has bothered her ever since she broke it years ago; therapist emphasized importance of pressure relief and reviewed various strategies. Session with emphasis on functional mobility/transfers, generalized strengthening, dynamic standing balance/coordination, and improved endurance with activity. Donned L shoe with max A and transported to/from room on 5C in [place] total A for time management and energy conservation purposes. Pt transferred WC<>mat to R using slideboard. Pt initially began with CGA but got halfway through and required rest break and mod A through remainder of transfer. Pt continues to require cues for sequencing to scoot to edge of chair, hand placement on board, pushing through LLE, and for head/hips relationship. Worked on blocked practice sit<>stands with RW and heavy max A of 1 x 2  trials using mirror for visual feedback with second person stabilizing L ankle due to increased supination this morning. Pt demonstrates improvements in reciprocal scooting technique and able to perform with supervision, increased time, and cues for sequencing. While standing, pt continues to require cues for upright posture, cervical/trunk/hip extension, and to relax shoulders. Raised mat and stood x 5 additional trials demonstrating improvements in placement of L foot underneath her, with anterior weight shifting, and good adherence to RLE NWB precautions. Attempted to hook horseshoe to top of mirror using LUE with emphasis on upright posture/gaze, however mirror too tall. Pt initially tried to use RUE but instantly lost balance requiring heavy mod A to correct due to altered balance strategies from NWB status. Instead took horseshoes from therapy tech and threw using LUE onto target with mod A overall for standing balance. Pt required numerous extended rest breaks due to fatigue but remained motivated throughout. Pt transferred mat<>WC via slideboard to L with CGA +2 for safety with increased time - min cues to place LLE on ground and use to push during transfer. Concluded session with pt sitting in WC, needs within reach, and chair pad alarm on. Provided pt with fresh ice water.   Treatment Session 2 Received pt sitting in WC, pt agreeable to PT treatment, and denied any pain during session. Session with emphasis on functional mobility/transfers, generalized strengthening, dynamic sitting balance/coordination, and improved endurance with activity. Pt transported to/from room on 5C in [facility] total A for time management and energy conservation purposes. Pt transferred WC<>Nustep to L using  slideboard and CGA of 1. Pt performed BUE/LLE strengthening on Nustep at workload 3 for 10 minutes for a total of 214 steps with emphasis on cardiovascular endurance and ROM. RLE supported on towel to maintain NWB precautions.  Nustep<>WC to R but downhill using slideboard and min A of 1 with cues and facilitation to place LLE underneath her to push. Pt then performed the following exercises sitting in [facility] with supervision and verbal cues for technique with 6in step supporting LLE: -bicep curls x15 with 2lb dowel and x15 with 4.5lb dowel  -horizontal chest press 2x12 with 3.5lb dowel -overhead chest press 2x12 with 3.5lb dowel Pt requested to return to bed at end of session and transferred WC<>bed to L via slideboard and min A of 1. Doffed L shoe with total A and pt transferred sit<>supine with min A for RLE management and cues for sequencing as pt stated "I forgot how to lie down". Pt able to pull herself to [facility] using headboard and rolled onto L side with supervision using bedrails. Concluded session with pt sidelying in bed, needs within reach, and bed alarm on. Pillows positioned for comfort and pressure relief purposes.   Therapy Documentation Precautions:  Precautions Precautions: Fall Restrictions Weight Bearing Restrictions: Yes RLE Weight Bearing: Non weight bearing  Therapy/Group: Individual Therapy Alfonse Alpers PT, DPT   12/14/2020, 7:16 AM

## 2020-12-14 NOTE — Progress Notes (Signed)
Patient ID: Andrea [name], female   DOB: [birth year], 87 y.o.   MRN: [id number]  Met with pt to discuss who she has lined up to assist her at discharge. She has a friend who can assist also discussed she would need to come in to attend therapies and learn her care prior to discharge. She feels she can and does not what to have to go to a SNF she has been to one in the past and does not want to do this.Will continue to work with pt on safest discharge plan.

## 2020-12-14 NOTE — Progress Notes (Signed)
Occupational Therapy Session Note  Patient Details  Name: Andrea [name] MRN: [id number] Date of Birth: [birth year]  Today's Date: 12/14/2020 OT Individual Time: 8295-6213 OT Individual Time Calculation (min): 55 min    Short Term Goals: Week 2:  OT Short Term Goal 1 (Week 2): Pt will complete toilet transfer with max assist of one caregiver OT Short Term Goal 2 (Week 2): Pt will complete toileting with max assist OT Short Term Goal 3 (Week 2): Pt will complete LB dressing with mod assist with AE as needed OT Short Term Goal 4 (Week 2): Pt will complete bathing with min assist with AE as needed  Skilled Therapeutic Interventions/Progress Updates:  Pt greeted supine in bed  agreeable to OT intervention. Session focus on  BADL reeducation and functional ADL transfers. Pt completed bed mobility with CGA with pt exiting to R side of bed. Pt completed bathing from EOB/ long sitting with overall MOD A. Pt able to reach upper legs in long sitting to wash LB but needed assist to wash below knee level. Pt declined changing brief to wash buttock. Pt required MIN A for UB dressing from EOB and MAX A for LB dressing needing most assist to initially thread RLE into pants and to pull pants up to waist line vii sit<>stand, MAX A +2 for sit<>stand from EOB with Rw. Pt completed SB transfer to w/c to the L side with MIN A +2. Pt completed seated grooming tasks at sink with set- up assist, Including oral care and brushing her hair. pt left seated in w/c with chair alarm activated and all needs within reach.                       Therapy Documentation Precautions:  Precautions Precautions: Fall Restrictions Weight Bearing Restrictions: Yes RLE Weight Bearing: Non weight bearing  Pain: pt reprots no pain during session.     Therapy/Group: Individual Therapy  Pollyann Glen [facility] 12/14/2020, 9:00 AM

## 2020-12-15 MED ORDER — HYDROCODONE-ACETAMINOPHEN 5-325 MG PO TABS
1.0000 | ORAL_TABLET | Freq: Four times a day (QID) | ORAL | Status: DC | PRN
  Administered 2020-12-15 – 2020-12-17 (×3): 1 via ORAL
  Filled 2020-12-15 (×3): qty 1

## 2020-12-15 NOTE — Progress Notes (Signed)
Physical Therapy Session Note  Patient Details  Name: Andrea [name] MRN: [id number] Date of Birth: 10-02-1925  Today's Date: 12/15/2020 PT Individual Time: 2202-5427 and 1400-1455  PT Individual Time Calculation (min): 68 min and 55 min  Short Term Goals: Week 1:  PT Short Term Goal 1 (Week 1): pt will transfer supine<>sitting EOB with min A PT Short Term Goal 1 - Progress (Week 1): Met PT Short Term Goal 2 (Week 1): pt will transfer bed<>chair with LRAD and max of 1 PT Short Term Goal 2 - Progress (Week 1): Progressing toward goal PT Short Term Goal 3 (Week 1): pt will transfer sit<>stand with LRAD and max of 1 PT Short Term Goal 3 - Progress (Week 1): Met Week 2:  PT Short Term Goal 1 (Week 2): pt will transfer sit<>supine with min A PT Short Term Goal 2 (Week 2): pt will transfer sit<>stand with LRAD and mod A PT Short Term Goal 3 (Week 2): pt will transfer bed<>chair with LRAD and mod A of 1  Skilled Therapeutic Interventions/Progress Updates:   Treatment Session 1 Received pt sitting in WC, pt agreeable to PT treatment, and reported pain in tailbone from sitting but denied any R hip pain. Session with emphasis on functional mobility/transfers, generalized strengthening, dynamic standing balance/coordination, simulated car transfers, and improved endurance with activity. Donned L shoe with max A and pt transported to/from room on 5C in [place] total A for time management and energy conservation purposes. Pt performed simulated car transfer using slideboard and mod A. Pt required cues for hand placement on board, anterior weight shifting, and head/ships relationship. Pt also required total A to get RLE in/out of car. Pt reports she has plenty of friends who could pick her up and someone could stay with her when she leaves the hospital. Discussed home set up and pt reports she has a ramp to enter and a few wheelchairs at home. Pt states her home is WC accessible and she can fit the WC into the  bathroom and bedroom. WC<>mat via slideboard to L with CGA of 1 and max multimodal cues for sequencing. Worked on sit<>stands (successful on 3/6 attempts) from elevated mat with RW and max A using mirror for visual feedback. While standing, worked on dynamic standing balance, reaching outside BOS, and crossing midline stacking/unstacking cups using LUE and mod A for balance x 3 trials. Pt demonstrated good adherence to RLE NWB precautions but required cues for LLE foot placement, upright posture/gaze, to relax shoulders, and for trunk/hip and L knee extension. Pt required multiple extended rest breaks throughout session due to fatigue. Mat<>WC via slideboard to R with min A of 1 and same cues mentioned above -therapist blocking LLE from sliding forward during transfer. Pt performed BUE strengthening on UBE at level 1 for 2 minutes forward and 2 minutes backwards - increased difficulty sustaining LUE on handlebar. Pt requested to return to bed and transferred WC<>bed via slideboard to L with CGA and doffed shoe with total A and transferred sit<>supine with min A and cues for sequencing. Pt able to pull on headboard to scoot to [facility] and rolled onto L side with supervision and use of bedrail. Concluded session with pt sidelying in bed, needs within reach, and bed alarm on. Pillows positioned for comfort and pressure relief purposes. Safety plan updated.   Treatment Session 2 Received pt semi-reclined in bed with RN present administering medications. Pt agreeable to PT treatment and denied any pain during session. Session with emphasis  on toileting/hygiene management, generalized strengthening, and improved endurance with activity. Checked to ensure brief was clean; pt found to be incontinent of urine and with no awareness; sacral dressing also soiled. Doffed dirty brief, removed dressing, and performed hygiene management dependently. RN notified and present to apply new dressing. Rolled L/R with supervision and use  of bedrails and donned clean brief with max A and placed clean chuck pad. Pt performed the following exercises with supervision and verbal cues for technique with emphasis on LE strength and ROM: -SLR 2x12 bilaterally -hip abduction 2x15 bilaterally  -RLE heel slides 2x15 (pt able to reach ~40 degrees knee flexion) -hip adduction pillow squeezes 2x20 -DF with orange TB x15 bilaterally -hip abduction with orange TB 2x15 -LLE hamstring stretch with gait belt 3x20 second hold -LLE marches 2x20 Concluded session with pt sidelying in bed, needs within reach, and bed alarm on. Pillows positioned for comfort and pressure relief.   Therapy Documentation Precautions:  Precautions Precautions: Fall Restrictions Weight Bearing Restrictions: Yes RLE Weight Bearing: Non weight bearing  Therapy/Group: Individual Therapy Alfonse Alpers PT, DPT   12/15/2020, 7:28 AM

## 2020-12-15 NOTE — Progress Notes (Signed)
Occupational Therapy Session Note  Patient Details  Name: Andrea [name] MRN: [id number] Date of Birth: 1925/12/24  Today's Date: 12/15/2020 OT Individual Time: 3299-2426 OT Individual Time Calculation (min): 70 min    Short Term Goals: Week 2:  OT Short Term Goal 1 (Week 2): Pt will complete toilet transfer with max assist of one caregiver OT Short Term Goal 2 (Week 2): Pt will complete toileting with max assist OT Short Term Goal 3 (Week 2): Pt will complete LB dressing with mod assist with AE as needed OT Short Term Goal 4 (Week 2): Pt will complete bathing with min assist with AE as needed  Skilled Therapeutic Interventions/Progress Updates:    Treatment session with focus on self-care retraining, functional transfers, and BUE strengthening/endurance.  Pt received supine in bed agreeable to therapy session.  Pt donned pants with use of reacher, requiring mod multimodal cues and initial hand over hand to setup use of reacher as pt with no recall of use from previous sessions.  Pt then able to roll R and L to pull pants over hips with increased time.  Pt completed bed mobility to sitting to EOB with CGA.  Therapist placed slide board total assist and pt then able to transfer bed > w/c with mod assist to R requiring increased cues for weight shifting and sequencing during transfer.  Pt completed UB bathing, dressing, and grooming tasks seated at sink with setup assist.  Pt required min assist to don shirt due to tight fit of shirt.  Pt propelling w/c 100' with increased time and cues for technique, pt demonstrating difficulty with LUE due to h/o CVA.  Pt remained upright in w/c with chair alarm on and all needs in reach.  Therapy Documentation Precautions:  Precautions Precautions: Fall Restrictions Weight Bearing Restrictions: Yes RLE Weight Bearing: Non weight bearing General:   Vital Signs:  Pain:   ADL: ADL Upper Body Bathing: Minimal assistance Where Assessed-Upper Body Bathing:  Edge of bed Lower Body Bathing: Dependent (+2) Where Assessed-Lower Body Bathing: Bed level Upper Body Dressing: Moderate assistance Where Assessed-Upper Body Dressing: Edge of bed Lower Body Dressing: Dependent (+2) Where Assessed-Lower Body Dressing: Bed level Toileting: Dependent (+2) Where Assessed-Toileting: Bed level Toilet Transfer: Unable to assess Vision   Perception    Praxis   Exercises:   Other Treatments:     Therapy/Group: Individual Therapy  Rosalio Loud 12/15/2020, 8:41 AM

## 2020-12-15 NOTE — Progress Notes (Signed)
PROGRESS NOTE   Subjective/Complaints: Doing really well Appreciate Becky discussing discharge planning with her Has a great attitude Pain better controlled with scheduled tylenol  ROS: Patient denies fever, rash, sore throat, blurred vision, nausea, vomiting, diarrhea, cough, shortness of breath or chest pain, headache, or mood change. +hip pain- better controlled   Objective:   No results found. No results for input(s): WBC, HGB, HCT, PLT in the last 72 hours.  No results for input(s): NA, K, CL, CO2, GLUCOSE, BUN, CREATININE, CALCIUM in the last 72 hours.   Intake/Output Summary (Last 24 hours) at 12/15/2020 0927 Last data filed at 12/15/2020 0700 Gross per 24 hour  Intake 520 ml  Output --  Net 520 ml     Pressure Injury 12/05/20 Buttocks Left Stage 2 -  Partial thickness loss of dermis presenting as a shallow open injury with a red, pink wound bed without slough. A stage 2 measuring 1cmx1cm and surrounding skin is nonblaching. entire area measures 8.5cmx (Active)  12/05/20 1700  Location: Buttocks  Location Orientation: Left  Staging: Stage 2 -  Partial thickness loss of dermis presenting as a shallow open injury with a red, pink wound bed without slough.  Wound Description (Comments): A stage 2 measuring 1cmx1cm and surrounding skin is nonblaching. entire area measures 8.5cmx3.7cm.  Present on Admission: Yes    Physical Exam: Vital Signs Blood pressure (!) 143/67, pulse 65, temperature 97.7 F (36.5 C), temperature source Oral, resp. rate 18, height 5\' 4"  (1.626 m), weight 70.4 kg, SpO2 96 %. Gen: no distress, normal appearing HEENT: oral mucosa pink and moist, NCAT Cardio: Reg rate Chest: normal effort, normal rate of breathing Abd: soft, non-distended Ext: no edema   Psych: pleasant and cooperative, good awareness of weightbearing status Musculoskeletal:     Cervical back: Normal range of motion.      Comments: Right thigh and knee swollen, bruised. Expectedly tender to palpation and attempts at PROM.  Skin:    Comments: Incision is dressed with scant blood beneath foam dressing. Left buttock wound, stage 2 Neurological:     Mental Status: She is alert and oriented to person, place, and time.     Comments: Patient is alert.  Good insight and awareness. Mild STM deficits.  No acute distress.  Makes eye contact with examiner.  Follows simple commands. HOH. Otherwise CN normal. No focal sensory loss in limbs. Motor 4+/5 UE bilaterally. RLE 1/5 d/t pain at hip and knee and 4/5 ADF/PF. LLE 2+ to 3-/5 HF, 3/5 KE and 4/5 ADF/PF. DTR's 1+ no focal limb ataxia or tremor appreciated while supine in bed     Assessment/Plan: 1. Functional deficits which require 3+ hours per day of interdisciplinary therapy in a comprehensive inpatient rehab setting. Physiatrist is providing close team supervision and 24 hour management of active medical problems listed below. Physiatrist and rehab team continue to assess barriers to discharge/monitor patient progress toward functional and medical goals  Care Tool:  Bathing    Body parts bathed by patient: Right arm, Left arm, Chest, Abdomen, Face   Body parts bathed by helper: Front perineal area, Buttocks, Right lower leg, Left lower leg  Bathing assist Assist Level: Supervision/Verbal cueing (UB only)     Upper Body Dressing/Undressing Upper body dressing   What is the patient wearing?: Pull over shirt    Upper body assist Assist Level: Minimal Assistance - Patient > 75%    Lower Body Dressing/Undressing Lower body dressing      What is the patient wearing?: Pants     Lower body assist Assist for lower body dressing: Moderate Assistance - Patient 50 - 74%     Toileting Toileting    Toileting assist Assist for toileting: Moderate Assistance - Patient 50 - 74%     Transfers Chair/bed transfer  Transfers assist     Chair/bed transfer  assist level: Moderate Assistance - Patient 50 - 74% Chair/bed transfer assistive device: Sliding board   Locomotion Ambulation   Ambulation assist   Ambulation activity did not occur: Safety/medical concerns          Walk 10 feet activity   Assist  Walk 10 feet activity did not occur: Safety/medical concerns (pain, weakness, decreased balance, poor adherance to RLE NWB precautions)        Walk 50 feet activity   Assist Walk 50 feet with 2 turns activity did not occur: Safety/medical concerns         Walk 150 feet activity   Assist Walk 150 feet activity did not occur: Safety/medical concerns         Walk 10 feet on uneven surface  activity   Assist Walk 10 feet on uneven surfaces activity did not occur: Safety/medical concerns         Wheelchair     Assist Is the patient using a wheelchair?: Yes Type of Wheelchair: Manual Wheelchair activity did not occur: Safety/medical concerns  Wheelchair assist level: Supervision/Verbal cueing Max wheelchair distance: 22ft    Wheelchair 50 feet with 2 turns activity    Assist    Wheelchair 50 feet with 2 turns activity did not occur: Safety/medical concerns   Assist Level: Moderate Assistance - Patient 50 - 74%   Wheelchair 150 feet activity     Assist  Wheelchair 150 feet activity did not occur: Safety/medical concerns       Blood pressure (!) 143/67, pulse 65, temperature 97.7 F (36.5 C), temperature source Oral, resp. rate 18, height 5\' 4"  (1.626 m), weight 70.4 kg, SpO2 96 %.  Medical Problem List and Plan: 1.  Right distal femur fracture with intercondylar extension secondary to fall.  Status post ORIF 12/02/2020.  Nonweightbearing             -patient may shower             -ELOS/Goals: 21-24 days, supervision to min assist goals  Continue CIR therapies including PT, OT- has not yet been able to walk.  2.  Impaired mobility: -DVT/anticoagulation: Vascular study reviewed and is  negative.  Pharmaceutical: Continue Lovenox             -antiplatelet therapy: Aspirin 81 mg daily 3. Femur fracture/right knee pain: Scheduled Tylenol 650mg  TID. Decrease Hydrocodone to 1 tab q6H as needed. Voltaren gel 2mg  QID  4. Mood: Provide emotional support             -antipsychotic agents: N/A 5. Neuropsych: This patient is capable of making decisions on her own behalf. 6. Stage 2 sacral pressure injury: offload q2H, wound care. Continue vitamin C 1,000mg  daily to promote wound healing.  7. Fluids/Electrolytes/Nutrition: Routine in and outs with follow-up chemistries 8.  Acute blood loss anemia.  Hgb stable- continue to monitor weekly 9.  Hyperlipidemia.  Continue Lipitor 10.  Systolic BP elevated, diastolic BP soft:  discontinue Norvasc. Monitor with increased mobility 11.  Hypothyroidism.  Synthroid 12.  History of CVA with resulting gait abnormality.  Continue aspirin 13. Overweight BMI 26.98: provide counseling.  14. Urine with foul strong odor- patient is alert and oriented and bright with no urinary complaints, afebrile- no longer has odor, no sx.  15. Constipation: continue colace 100mg  BID 16. Insomnia: continue melatonin 3mg  HS 17. Disposition: HFU scheduled. Need to assess how much support family can provide. D/c October 1st.   LOS: 10 days A FACE TO FACE EVALUATION WAS PERFORMED  Nandana Krolikowski 12/15/2020, 9:27 AM

## 2020-12-16 NOTE — Progress Notes (Signed)
Physical Therapy Session Note  Patient Details  Name: Andrea [name] MRN: [id number] Date of Birth: 09/12/1925  Today's Date: 12/16/2020 PT Individual Time: 1000-1056 and [id number]  PT Individual Time Calculation (min): 56 min and 27 min  Short Term Goals: Week 1:  PT Short Term Goal 1 (Week 1): pt will transfer supine<>sitting EOB with min A PT Short Term Goal 1 - Progress (Week 1): Met PT Short Term Goal 2 (Week 1): pt will transfer bed<>chair with LRAD and max of 1 PT Short Term Goal 2 - Progress (Week 1): Progressing toward goal PT Short Term Goal 3 (Week 1): pt will transfer sit<>stand with LRAD and max of 1 PT Short Term Goal 3 - Progress (Week 1): Met Week 2:  PT Short Term Goal 1 (Week 2): pt will transfer sit<>supine with min A PT Short Term Goal 2 (Week 2): pt will transfer sit<>stand with LRAD and mod A PT Short Term Goal 3 (Week 2): pt will transfer bed<>chair with LRAD and mod A of 1  Skilled Therapeutic Interventions/Progress Updates:   Treatment Session 1 Received pt slouching down in bed, pt agreeable to PT treatment, and reported pain in R hip (unrated) and reported not sleeping well last night and feeling "like her head's not on straight". RN notified and reported pt not due for more pain medication yet. Session with focus on functional mobility/transfers, hygiene management, generalized strengthening, WC mobility, and improved activity tolerance. Checked to ensure pt's brief was clean and pt found to be incontinent of urine. Rolled L/R with supervision and use of bedrails and doffed dirty brief, performed peri-care dependently, and found sacral dressing to be soiled. RN notified and present to apply new dressing. Donned brief with max A via rolling and pants in same manner (pt able to assist in pulling pants up). Supine<>sitting EOB from flat bed with use of bedrails and supervision. Scooted to EOB with supervision and donned L shoe with max A. Pt transferred bed<>WC via  slideboard to R with CGA to start but mod A halfway through transfer due to fatigue. Encouraged pt to try placing board herself, however pt unable to correctly place board requiring assist from therapist. Also required cues for hand placement on board, anterior weight shifting, and to push through LLE as pt initially holding L foot in the air. Worked on WC mobility 72f x 1 and 242fx 1 using BUE and supervision - multimodal cues for sequencing due to tendency to veer towards L due to L sided weakness from prior CVA. [facility] look outside large windows to uplift mood/spirits as pt reports being an "outdoor person". Reviewed techniques for pressure relief - pt able to recall with min cues. Concluded session with pt sitting in WC, needs within reach, and chair pad alarm on.   Treatment Session 2 Received pt sitting in WC, pt agreeable to PT treatment, and reported pain in "tailbone" but reported just receiving pain medication. Pt requested to lie down reporting fatigue from not sleeping well last night. Session with focus on functional mobility, generalized strengthening, and improved activity tolerance. Pt transferred WC<>bed via slideboard to L with CGA and total A to place board. Sit<>supine with CGA/supervision. Doffed pants with mod A and checked to ensure pt's brief was clean/dry. Pt performed the following exercises supine in bed with supervision and verbal cues for technique with emphasis on LE strength and ROM: -SLR 2x12 bilaterally -heel slides 2x12 on RLE (~45 degrees knee flexion today) -  hip abduction 2x15 bilaterally  -hip adduction pillow squeezes 1x20 Rolled onto L side with supervision using bedrail. Concluded session with pt in L sidelying in bed, needs within reach, and bed alarm on. Pillows positioned for comfort and pressure relief purposes.   Therapy Documentation Precautions:  Precautions Precautions: Fall Restrictions Weight Bearing Restrictions:  Yes RLE Weight Bearing: Non weight bearing  Therapy/Group: Individual Therapy Alfonse Alpers PT, DPT   12/16/2020, 7:25 AM

## 2020-12-16 NOTE — Plan of Care (Signed)
  Problem: RH Ambulation Goal: LTG Patient will ambulate in home environment (PT) Description: LTG: Patient will ambulate in home environment, # of feet with assistance (PT). Outcome: Not Applicable Flowsheets (Taken 12/16/2020 0738) LTG: Pt will ambulate in home environ  assist needed:: (D/C -pt will not be household ambulator due to RLE NWB precautions) -- Note: D/C -pt will not be household ambulator due to RLE NWB precautions   Problem: RH Ambulation Goal: LTG Patient will ambulate in controlled environment (PT) Description: LTG: Patient will ambulate in a controlled environment, # of feet with assistance (PT). Flowsheets (Taken 12/16/2020 0738) LTG: Pt will ambulate in controlled environ  assist needed:: (downgraded due to generalized weakness/deconditioning and altered balance strategies due to RLE NWB precautions) Moderate Assistance - Patient 50 - 74% LTG: Ambulation distance in controlled environment: 18ft Note: downgraded due to generalized weakness/deconditioning and altered balance strategies due to RLE NWB precautions

## 2020-12-17 DIAGNOSIS — S728X1D Other fracture of right femur, subsequent encounter for closed fracture with routine healing: Secondary | ICD-10-CM

## 2020-12-17 DIAGNOSIS — E46 Unspecified protein-calorie malnutrition: Secondary | ICD-10-CM

## 2020-12-17 DIAGNOSIS — D62 Acute posthemorrhagic anemia: Secondary | ICD-10-CM

## 2020-12-17 DIAGNOSIS — G8918 Other acute postprocedural pain: Secondary | ICD-10-CM

## 2020-12-17 DIAGNOSIS — E8809 Other disorders of plasma-protein metabolism, not elsewhere classified: Secondary | ICD-10-CM

## 2020-12-17 MED ORDER — PROSOURCE PLUS PO LIQD
30.0000 mL | Freq: Two times a day (BID) | ORAL | Status: DC
  Administered 2020-12-17 – 2020-12-19 (×5): 30 mL via ORAL
  Filled 2020-12-17 (×5): qty 30

## 2020-12-17 NOTE — Progress Notes (Signed)
Orthopedic Tech Progress Note Patient Details:  Andrea [name] 14-Feb-1926 [id number]  Ortho Devices Type of Ortho Device: Thumb velcro splint Ortho Device/Splint Location: left Ortho Device/Splint Interventions: Application   Post Interventions Patient Tolerated: Well Instructions Provided: Care of device  Saul Fordyce 12/17/2020, 2:55 PM

## 2020-12-17 NOTE — Progress Notes (Signed)
PROGRESS NOTE   Subjective/Complaints: Patient seen sitting up in bed this morning.  She states she slept well overnight.  She is in good spirits.  She denies complaints.  ROS: Denies CP, SOB, N/V/D  Objective:   No results found. No results for input(s): WBC, HGB, HCT, PLT in the last 72 hours.  No results for input(s): NA, K, CL, CO2, GLUCOSE, BUN, CREATININE, CALCIUM in the last 72 hours.   Intake/Output Summary (Last 24 hours) at 12/17/2020 0809 Last data filed at 12/16/2020 1815 Gross per 24 hour  Intake 417 ml  Output --  Net 417 ml      Pressure Injury 12/05/20 Buttocks Left Stage 2 -  Partial thickness loss of dermis presenting as a shallow open injury with a red, pink wound bed without slough. A stage 2 measuring 1cmx1cm and surrounding skin is nonblaching. entire area measures 8.5cmx (Active)  12/05/20 1700  Location: Buttocks  Location Orientation: Left  Staging: Stage 2 -  Partial thickness loss of dermis presenting as a shallow open injury with a red, pink wound bed without slough.  Wound Description (Comments): A stage 2 measuring 1cmx1cm and surrounding skin is nonblaching. entire area measures 8.5cmx3.7cm.  Present on Admission: Yes    Physical Exam: Vital Signs Blood pressure (!) 118/46, pulse 63, temperature 98.3 F (36.8 C), temperature source Oral, resp. rate 16, height 5\' 4"  (1.626 m), weight 70.4 kg, SpO2 95 %. Constitutional: No distress . Vital signs reviewed. HENT: Normocephalic.  Atraumatic. Eyes: EOMI. No discharge. Cardiovascular: No JVD.  RRR. Respiratory: Normal effort.  No stridor.  Bilateral clear to auscultation. GI: Non-distended.  BS +. Skin: Warm and dry.  Incision with dressing CDI Psych: Normal mood.  Normal behavior. Musc: Right thigh with edema and tenderness  Neuro: Alert Motor: Bilateral upper extremities: 4+/5 proximal distal Right lower extremity: Hip flexion, knee  extension 2+/5, ankle dorsiflexion 4+/5  Left lower extremity: Hip flexion, knee extension 3+/5, ankle dorsiflexion 4+/5  Assessment/Plan: 1. Functional deficits which require 3+ hours per day of interdisciplinary therapy in a comprehensive inpatient rehab setting. Physiatrist is providing close team supervision and 24 hour management of active medical problems listed below. Physiatrist and rehab team continue to assess barriers to discharge/monitor patient progress toward functional and medical goals  Care Tool:  Bathing    Body parts bathed by patient: Right arm, Left arm, Chest, Abdomen, Face   Body parts bathed by helper: Front perineal area, Buttocks, Right lower leg, Left lower leg     Bathing assist Assist Level: Supervision/Verbal cueing (UB only)     Upper Body Dressing/Undressing Upper body dressing   What is the patient wearing?: Pull over shirt    Upper body assist Assist Level: Minimal Assistance - Patient > 75%    Lower Body Dressing/Undressing Lower body dressing      What is the patient wearing?: Pants     Lower body assist Assist for lower body dressing: Moderate Assistance - Patient 50 - 74%     Toileting Toileting    Toileting assist Assist for toileting: Moderate Assistance - Patient 50 - 74%     Transfers Chair/bed transfer  Transfers assist  Chair/bed transfer assist level: Moderate Assistance - Patient 50 - 74% (to R) Chair/bed transfer assistive device: Sliding board   Locomotion Ambulation   Ambulation assist   Ambulation activity did not occur: Safety/medical concerns          Walk 10 feet activity   Assist  Walk 10 feet activity did not occur: Safety/medical concerns (pain, weakness, decreased balance, poor adherance to RLE NWB precautions)        Walk 50 feet activity   Assist Walk 50 feet with 2 turns activity did not occur: Safety/medical concerns         Walk 150 feet activity   Assist Walk 150 feet  activity did not occur: Safety/medical concerns         Walk 10 feet on uneven surface  activity   Assist Walk 10 feet on uneven surfaces activity did not occur: Safety/medical concerns         Wheelchair     Assist Is the patient using a wheelchair?: Yes Type of Wheelchair: Manual Wheelchair activity did not occur: Safety/medical concerns  Wheelchair assist level: Supervision/Verbal cueing Max wheelchair distance: 74ft    Wheelchair 50 feet with 2 turns activity    Assist    Wheelchair 50 feet with 2 turns activity did not occur: Safety/medical concerns   Assist Level: Moderate Assistance - Patient 50 - 74%   Wheelchair 150 feet activity     Assist  Wheelchair 150 feet activity did not occur: Safety/medical concerns       Blood pressure (!) 118/46, pulse 63, temperature 98.3 F (36.8 C), temperature source Oral, resp. rate 16, height 5\' 4"  (1.626 m), weight 70.4 kg, SpO2 95 %.  Medical Problem List and Plan: 1.  Right distal femur fracture with intercondylar extension secondary to fall.  Status post ORIF 12/02/2020.  Nonweightbearing  Continue CIR 2.  Impaired mobility: -DVT/anticoagulation: Vascular study negative.  Pharmaceutical: Continue Lovenox             -antiplatelet therapy: Aspirin 81 mg daily 3. Femur fracture/right knee pain: Scheduled Tylenol 650mg  TID. Decrease Hydrocodone to 1 tab q6H as needed. Voltaren gel 2mg  QID   Controlled on 9/18 4. Mood: Provide emotional support             -antipsychotic agents: N/A 5. Neuropsych: This patient is capable of making decisions on her own behalf. 6. Stage 2 sacral pressure injury: offload q2H, wound care. Continue vitamin C 1,000mg  daily to promote wound healing.  7. Fluids/Electrolytes/Nutrition: Routine in and outs 8.  Acute blood loss anemia.    Hemoglobin 8.2 on 9/7, labs ordered for tomorrow 9.  Hyperlipidemia: Lipitor 10.  Systolic BP elevated, diastolic BP soft:  discontinue Norvasc.  Monitor with increased mobility  Relatively controlled on 9/18 11.  Hypothyroidism.  Synthroid 12.  History of CVA with resulting gait abnormality.  Continue aspirin 13. Overweight BMI 26.98: provide counseling.  14. Urine with foul strong odor- patient is alert and oriented and bright with no urinary complaints, afebrile- no longer has odor, no sx.  15. Constipation: continue colace 100mg  BID 16.  Sleep disturbance: continue melatonin 3mg  HS 17. Disposition: HFU scheduled. Need to assess how much support family can provide. D/c October 1st.  18.  Hypoalbuminemia  Supplement initiated on 9/18   LOS: 12 days A FACE TO FACE EVALUATION WAS PERFORMED  Jeffery Bachmeier 10/18 12/17/2020, 8:09 AM

## 2020-12-18 LAB — CBC WITH DIFFERENTIAL/PLATELET
Abs Immature Granulocytes: 0.03 10*3/uL (ref 0.00–0.07)
Basophils Absolute: 0 10*3/uL (ref 0.0–0.1)
Basophils Relative: 0 %
Eosinophils Absolute: 0 10*3/uL (ref 0.0–0.5)
Eosinophils Relative: 0 %
HCT: 34.1 % — ABNORMAL LOW (ref 36.0–46.0)
Hemoglobin: 10.6 g/dL — ABNORMAL LOW (ref 12.0–15.0)
Immature Granulocytes: 1 %
Lymphocytes Relative: 38 %
Lymphs Abs: 2.5 10*3/uL (ref 0.7–4.0)
MCH: 32.1 pg (ref 26.0–34.0)
MCHC: 31.1 g/dL (ref 30.0–36.0)
MCV: 103.3 fL — ABNORMAL HIGH (ref 80.0–100.0)
Monocytes Absolute: 0.5 10*3/uL (ref 0.1–1.0)
Monocytes Relative: 7 %
Neutro Abs: 3.5 10*3/uL (ref 1.7–7.7)
Neutrophils Relative %: 54 %
Platelets: 425 10*3/uL — ABNORMAL HIGH (ref 150–400)
RBC: 3.3 MIL/uL — ABNORMAL LOW (ref 3.87–5.11)
RDW: 17 % — ABNORMAL HIGH (ref 11.5–15.5)
WBC: 6.5 10*3/uL (ref 4.0–10.5)
nRBC: 0 % (ref 0.0–0.2)

## 2020-12-18 NOTE — Progress Notes (Signed)
Occupational Therapy Session Note  Patient Details  Name: Andrea [name] MRN: [id number] Date of Birth: 1925-06-02  Today's Date: 12/18/2020 OT Individual Time: 0938-1829 OT Individual Time Calculation (min): 60 min    Short Term Goals: Week 1:  OT Short Term Goal 1 (Week 1): Pt will complete bathing with mod assist OT Short Term Goal 1 - Progress (Week 1): Met OT Short Term Goal 2 (Week 1): Pt will complete LB dressing with max assist and AE as needed OT Short Term Goal 2 - Progress (Week 1): Met OT Short Term Goal 3 (Week 1): Pt will complete toilet transfer with max assist of one caregiver OT Short Term Goal 3 - Progress (Week 1): Progressing toward goal OT Short Term Goal 4 (Week 1): Pt will complete toileting with max assist OT Short Term Goal 4 - Progress (Week 1): Progressing toward goal  Skilled Therapeutic Interventions/Progress Updates:    Pt greeted semi-reclined in bed and agreeable to OT treatment session. No reports of pain. Pt had been incontinent of urine and unaware. Rolling in bed with min A for OT assist to change brief. Pt able to assist with peri-care. OT noted pt had on L thumb spica splint. Unsure why this was on. Pt stated it was just given to her, but she has not reports of pain. Pt completed slideboard transfer bed to wc with mod A. UB bathing/dressing completed from wc at the sink with set-up A. Discussed PLOF, OT goals, and dc plan with patient. Pt left seated in wc with alarm on, call bell in reach, and needs met.   Therapy Documentation Precautions:  Precautions Precautions: Fall Restrictions Weight Bearing Restrictions: Yes RLE Weight Bearing: Non weight bearing Pain: Pain Assessment Pain Scale: 0-10 Pain Score: 0-No pain    Therapy/Group: Individual Therapy  Valma Cava 12/18/2020, 9:22 AM

## 2020-12-18 NOTE — Progress Notes (Signed)
Physical Therapy Session Note  Patient Details  Name: Andrea [name] MRN: [id number] Date of Birth: 16-Jan-1926  Today's Date: 12/18/2020 PT Individual Time: 1000-1058 PT Individual Time Calculation (min): 58 min   Short Term Goals: Week 2:  PT Short Term Goal 1 (Week 2): pt will transfer sit<>supine with min A PT Short Term Goal 2 (Week 2): pt will transfer sit<>stand with LRAD and mod A PT Short Term Goal 3 (Week 2): pt will transfer bed<>chair with LRAD and mod A of 1  Skilled Therapeutic Interventions/Progress Updates:     Pt in w/c at start of session - agreeable to therapy and reports no pain. Pt propelled herself in w/c ~56ft with supervision on level ground - requires cues for hand placement and difficulty noted with LUE coordination/placement due to prior CVA. Stroke efficiency very poor with significantly decreased speed in w/c. Transported remaining distance downstairs to main rehab gym for time. Positioned in // bars in w/c. Completed x3 sit<>stands with modA with her R foot on top of therapist's foot to monitor NWB compliance which she maintained. For each stand, worked on standing tolerance ranging from 60-85 seconds. In standing, required modA for balance due to posterior bias with cues for facilitating upright posture and hip/thoracic extension. Wheeled to mat table and completed sliding board transfer with +2 maxA towards her weaker R side. She reports she usually does better than that and per notes, typically requires modA. Worked on latearl scoot at edge of mat where she could complete both directions with supervision and not much difficulty. Assisted to supine position with large wedge for trunk support to complete supine there-ex: -2x15 heel slides on R (AAROM with limited knee flexion) -2x15 SLR on R (AAROM) -2x15 hip abduction on R (AAROM)  Required mod for supine<>sit for RLE and trunk support. Completed sliding board transfer with minA towards her L side from elevated  surface back to her w/c (required modA for repositioning in chair) . Pt returned upstairs to her room for time in w/c and remained seated with chair alarm on and all her needs within reach.   Therapy Documentation Precautions:  Precautions Precautions: Fall Restrictions Weight Bearing Restrictions: Yes RLE Weight Bearing: Non weight bearing General:    Therapy/Group: Individual Therapy  Orrin Brigham 12/18/2020, 7:31 AM

## 2020-12-18 NOTE — Progress Notes (Signed)
Physical Therapy Session Note  Patient Details  Name: Andrea [name] MRN: [id number] Date of Birth: 1925-12-26  Today's Date: 12/18/2020 PT Individual Time: 0347-4259 PT Individual Time Calculation (min): 55 min   Short Term Goals: Week 1:  PT Short Term Goal 1 (Week 1): pt will transfer supine<>sitting EOB with min A PT Short Term Goal 1 - Progress (Week 1): Met PT Short Term Goal 2 (Week 1): pt will transfer bed<>chair with LRAD and max of 1 PT Short Term Goal 2 - Progress (Week 1): Progressing toward goal PT Short Term Goal 3 (Week 1): pt will transfer sit<>stand with LRAD and max of 1 PT Short Term Goal 3 - Progress (Week 1): Met Week 2:  PT Short Term Goal 1 (Week 2): pt will transfer sit<>supine with min A PT Short Term Goal 2 (Week 2): pt will transfer sit<>stand with LRAD and mod A PT Short Term Goal 3 (Week 2): pt will transfer bed<>chair with LRAD and mod A of 1  Skilled Therapeutic Interventions/Progress Updates:  Pt received seated in WC in room, close friend present in room, denied pain and was agreeable to PT. Emphasis of session on LE strengthening and improved independence w/sliding board transfers. Transported pt to reflection fountain w/total A for time management.   Seated therex in front of reflection fountain for LE strength and holistic stress management:  -Seated marches x30 per side w/3s isometric hold  -LAQ x20 per side  -Tricep push-ups w/LLE support x10, 3s isometric and 3s eccentric for improved UE strength w/transfers and pressure relief.   Pt transported back to room w/total A for time management. In room, practiced independent set-up of lateral sliding board transfer. Pt unable to recall how to set up transfer and required multimodal cues for set-up. Will benefit from continued practice, as pt is "hard-headed and every one causes her BP to rise" and is unlikely to ask for assistance at home. Pt was left seated in WC in room, all needs in reach.   Therapy  Documentation Precautions:  Precautions Precautions: Fall Restrictions Weight Bearing Restrictions: Yes RLE Weight Bearing: Non weight bearing   Therapy/Group: Individual Therapy Cruzita Lederer Shaymus Eveleth, PT, DPT  12/18/2020, 7:52 AM

## 2020-12-18 NOTE — Progress Notes (Signed)
PROGRESS NOTE   Subjective/Complaints: Pt up with OT. No new complaints. Has spica splint LUE. She's not sure why  ROS: Patient denies fever, rash, sore throat, blurred vision, nausea, vomiting, diarrhea, cough, shortness of breath or chest pain, joint or back pain, headache, or mood change.   Objective:   No results found. Recent Labs    12/18/20 0612  WBC 6.5  HGB 10.6*  HCT 34.1*  PLT 425*    No results for input(s): NA, K, CL, CO2, GLUCOSE, BUN, CREATININE, CALCIUM in the last 72 hours.   Intake/Output Summary (Last 24 hours) at 12/18/2020 1154 Last data filed at 12/17/2020 1800 Gross per 24 hour  Intake 476 ml  Output --  Net 476 ml     Pressure Injury 12/05/20 Buttocks Left Stage 2 -  Partial thickness loss of dermis presenting as a shallow open injury with a red, pink wound bed without slough. A stage 2 measuring 1cmx1cm and surrounding skin is nonblaching. entire area measures 8.5cmx (Active)  12/05/20 1700  Location: Buttocks  Location Orientation: Left  Staging: Stage 2 -  Partial thickness loss of dermis presenting as a shallow open injury with a red, pink wound bed without slough.  Wound Description (Comments): A stage 2 measuring 1cmx1cm and surrounding skin is nonblaching. entire area measures 8.5cmx3.7cm.  Present on Admission: Yes    Physical Exam: Vital Signs Blood pressure (!) 150/64, pulse 62, temperature 97.8 F (36.6 C), temperature source Oral, resp. rate 18, height 5\' 4"  (1.626 m), weight 70.4 kg, SpO2 96 %. Constitutional: No distress . Vital signs reviewed. HEENT: NCAT, EOMI, oral membranes moist Neck: supple Cardiovascular: RRR without murmur. No JVD    Respiratory/Chest: CTA Bilaterally without wheezes or rales. Normal effort    GI/Abdomen: BS +, non-tender, non-distended Ext: no clubbing, cyanosis, or edema Psych: pleasant and cooperative  Skin: Warm and dry.  Incision with dressing  CDI Musc: Right thigh with edema and tenderness, leftSPICA splint--pt without pain LUE  Neuro: Alert Motor: Bilateral upper extremities: 4+/5 proximal distal Right lower extremity: Hip flexion, knee extension 2+/5, ankle dorsiflexion 4+/5  Left lower extremity: Hip flexion, knee extension 3+/5, ankle dorsiflexion 4+/5  Assessment/Plan: 1. Functional deficits which require 3+ hours per day of interdisciplinary therapy in a comprehensive inpatient rehab setting. Physiatrist is providing close team supervision and 24 hour management of active medical problems listed below. Physiatrist and rehab team continue to assess barriers to discharge/monitor patient progress toward functional and medical goals  Care Tool:  Bathing    Body parts bathed by patient: Right arm, Left arm, Chest, Abdomen, Face   Body parts bathed by helper: Front perineal area, Buttocks, Right lower leg, Left lower leg     Bathing assist Assist Level: Supervision/Verbal cueing (UB only)     Upper Body Dressing/Undressing Upper body dressing   What is the patient wearing?: Pull over shirt    Upper body assist Assist Level: Minimal Assistance - Patient > 75%    Lower Body Dressing/Undressing Lower body dressing      What is the patient wearing?: Pants     Lower body assist Assist for lower body dressing: Moderate Assistance - Patient  50 - 74%     Toileting Toileting    Toileting assist Assist for toileting: Moderate Assistance - Patient 50 - 74%     Transfers Chair/bed transfer  Transfers assist     Chair/bed transfer assist level: Moderate Assistance - Patient 50 - 74% (to R) Chair/bed transfer assistive device: Sliding board   Locomotion Ambulation   Ambulation assist   Ambulation activity did not occur: Safety/medical concerns          Walk 10 feet activity   Assist  Walk 10 feet activity did not occur: Safety/medical concerns (pain, weakness, decreased balance, poor adherance to  RLE NWB precautions)        Walk 50 feet activity   Assist Walk 50 feet with 2 turns activity did not occur: Safety/medical concerns         Walk 150 feet activity   Assist Walk 150 feet activity did not occur: Safety/medical concerns         Walk 10 feet on uneven surface  activity   Assist Walk 10 feet on uneven surfaces activity did not occur: Safety/medical concerns         Wheelchair     Assist Is the patient using a wheelchair?: Yes Type of Wheelchair: Manual Wheelchair activity did not occur: Safety/medical concerns  Wheelchair assist level: Supervision/Verbal cueing Max wheelchair distance: 47ft    Wheelchair 50 feet with 2 turns activity    Assist    Wheelchair 50 feet with 2 turns activity did not occur: Safety/medical concerns   Assist Level: Moderate Assistance - Patient 50 - 74%   Wheelchair 150 feet activity     Assist  Wheelchair 150 feet activity did not occur: Safety/medical concerns       Blood pressure (!) 150/64, pulse 62, temperature 97.8 F (36.6 C), temperature source Oral, resp. rate 18, height 5\' 4"  (1.626 m), weight 70.4 kg, SpO2 96 %.  Medical Problem List and Plan: 1.  Right distal femur fracture with intercondylar extension secondary to fall.  Status post ORIF 12/02/2020.  Nonweightbearing  -Continue CIR therapies including PT, OT   -not sure why SPICA splint was given to pt. She says occasionally her left hand will "lock up" d/t old cva, but even so, we wouldn't use this type of brace.   -dc splint 2.  Impaired mobility: -DVT/anticoagulation: Vascular study negative.  Pharmaceutical: Continue Lovenox             -antiplatelet therapy: Aspirin 81 mg daily 3. Femur fracture/right knee pain: Scheduled Tylenol 650mg  TID. Decrease Hydrocodone to 1 tab q6H as needed. Voltaren gel 2mg  QID   Controlled on 9/19 4. Mood: Provide emotional support             -antipsychotic agents: N/A 5. Neuropsych: This patient is  capable of making decisions on her own behalf. 6. Stage 2 sacral pressure injury: offload q2H, wound care. Continue vitamin C 1,000mg  daily to promote wound healing.  7. Fluids/Electrolytes/Nutrition: Routine in and outs 8.  Acute blood loss anemia.    Hemoglobin 8.2 on 9/7--> 10.6 9/19 9.  Hyperlipidemia: Lipitor 10.  Systolic BP elevated, diastolic BP soft:  discontinue Norvasc. Monitor with increased mobility  Relatively controlled on 9/19 11.  Hypothyroidism.  Synthroid 12.  History of CVA with resulting gait abnormality.  Continue aspirin 13. Overweight BMI 26.98: provide counseling.  14. Urine with foul strong odor- patient is alert and oriented and bright with no urinary complaints, afebrile- no longer has odor, no  sx.  15. Constipation: continue colace 100mg  BID 16.  Sleep disturbance: continue melatonin 3mg  HS 17. Disposition: HFU scheduled. Need to assess how much support family can provide. D/c October 1st.  18.  Hypoalbuminemia  Supplement initiated on 9/18   LOS: 13 days A FACE TO FACE EVALUATION WAS PERFORMED  09-29-1996 12/18/2020, 11:54 AM

## 2020-12-18 NOTE — Progress Notes (Signed)
Occupational Therapy Session Note  Patient Details  Name: Andrea [name] MRN: [id number] Date of Birth: 1925-11-14  Today's Date: 12/18/2020 OT Individual Time: 1415-1445 OT Individual Time Calculation (min): 30 min    Short Term Goals: Week 2:  OT Short Term Goal 1 (Week 2): Pt will complete toilet transfer with max assist of one caregiver OT Short Term Goal 2 (Week 2): Pt will complete toileting with max assist OT Short Term Goal 3 (Week 2): Pt will complete LB dressing with mod assist with AE as needed OT Short Term Goal 4 (Week 2): Pt will complete bathing with min assist with AE as needed  Skilled Therapeutic Interventions/Progress Updates:    Treatment session with focus on sit > stand and slide board transfers.  Pt received upright in w/c agreeable to therapy session.  Blocked practice sit > stand x4.  Pt completing sit > stand with max assist of one caregiver with +2 assist to stabilize RW.  Therapist positioning foot under RLE as reminder for NWB through RLE during sit > stand and when standing.  Pt benefits from multimodal cues for upright standing posture.  Pt completed slide board transfer back to bed with mod assist and multimodal cues for anterior weight shifting and sequencing during transfer.  Pt noted to be incontinent of urine, therefore engaged in rolling R and L to allow therapist to complete hygiene and don fresh brief.  Pt remained semi-reclined in bed with all needs in reach.  Therapy Documentation Precautions:  Precautions Precautions: Fall Restrictions Weight Bearing Restrictions: Yes RLE Weight Bearing: Non weight bearing General:   Vital Signs: Therapy Vitals Temp: 98.5 F (36.9 C) Temp Source: Oral Pulse Rate: 76 Resp: 18 BP: 131/65 Oxygen Therapy SpO2: 95 % O2 Device: Room Air Pain:  Pt with no c/o pain    Therapy/Group: Individual Therapy  Rosalio Loud 12/18/2020, 3:30 PM

## 2020-12-19 MED ORDER — HYDROCODONE-ACETAMINOPHEN 5-325 MG PO TABS: 1.0000 | ORAL_TABLET | Freq: Three times a day (TID) | ORAL | Status: DC | PRN

## 2020-12-19 NOTE — Progress Notes (Signed)
Occupational Therapy Session Note  Patient Details  Name: Andrea [name] MRN: [id number] Date of Birth: March 26, 1926  Today's Date: 12/19/2020 OT Individual Time: 4008-6761 OT Individual Time Calculation (min): 60 min    Short Term Goals: Week 2:  OT Short Term Goal 1 (Week 2): Pt will complete toilet transfer with max assist of one caregiver OT Short Term Goal 2 (Week 2): Pt will complete toileting with max assist OT Short Term Goal 3 (Week 2): Pt will complete LB dressing with mod assist with AE as needed OT Short Term Goal 4 (Week 2): Pt will complete bathing with min assist with AE as needed  Skilled Therapeutic Interventions/Progress Updates:    Treatment session with focus on self-care retraining, sit > stand, and functional transfers.  Pt received semi-reclined in bed reporting not sleeping well overnight but agreeable to therapy session.  Pt noted to be incontinent of urine, therefore engaged in rolling R and L at bed level to allow therapist to complete hygiene and don clean brief.  Pt completed bed mobility to come to sitting EOB with CGA and increased time. Pt able to thread BLE into pants with CGA for sitting balance and min assist fading to supervision/cues with use of reacher when treading RLE.  Pt required max assist of 1 for sit > stand with RW and then to pull pants over hips while pt maintained standing with RW.  Pt completed slide board transfer to w/c with mod assist and multimodal cues for anterior weight shift and sequencing of transfer.  Pt engaged in UB bathing/dressing and grooming tasks seated at sink with setup for items. Pt then engaged in self-feeding with setup for items. Pt remained upright in w/c with chair alarm on and all needs in reach.  Therapy Documentation Precautions:  Precautions Precautions: Fall Restrictions Weight Bearing Restrictions: Yes RLE Weight Bearing: Non weight bearing General:   Vital Signs: Therapy Vitals Temp: 98.6 F (37 C) Temp  Source: Oral Pulse Rate: 64 Resp: 14 BP: (!) 165/63 Patient Position (if appropriate): Lying Oxygen Therapy SpO2: 97 % O2 Device: Room Air Pain:  Pt with no c/o pain   Therapy/Group: Individual Therapy  Rosalio Loud 12/19/2020, 8:07 AM

## 2020-12-19 NOTE — Patient Care Conference (Signed)
Inpatient RehabilitationTeam Conference and Plan of Care Update Date: 12/19/2020   Time: 13:51 PM    Patient Name: Andrea [name]      Medical Record Number: [id number]  Date of Birth: [birth year] Sex: Female         Room/Bed: [id number] Payor Info: Payor: MEDICARE / Plan: MEDICARE PART A AND B / Product Type: *No Product type* /    Admit Date/Time:  12/05/2020  4:23 PM  Primary Diagnosis:  Femur fracture [facility])  Hospital Problems: Principal Problem:   Femur fracture ([place]) Active Problems:   Pressure injury of skin   Hypoalbuminemia due to protein-calorie malnutrition ([place])   Acute blood loss anemia   Postoperative pain    Expected Discharge Date: Expected Discharge Date: 12/30/20  Team Members Present: Physician leading conference: Dr. Leeroy Cha Social Worker Present: Ovidio Kin, LCSW Nurse Present: Dorien Chihuahua, RN PT Present: Becky Sax, PT OT Present: Simonne Come, OT PPS Coordinator present : Gunnar Fusi, SLP     Current Status/Progress Goal Weekly Team Focus  Bowel/Bladder   Pt is incontinent of bowel/bladder  Pt will become continent of bowel/bladder  Will assess qshift and PRN   Swallow/Nutrition/ Hydration             ADL's   Setup UB bathing/dressing, Mod-max assist LB bathing/dressing at bed level vs +2 for sit > stand, Mod assist slide board transfers  Min A overall Supervision UB dressing and grooming  ADL retraining, transfers, sit > stand, AE use, adherence to WB precautions, improved activity tolerance   Mobility   Sit <>stand w/mod A in // bars, S* WC mobility, mod-max A sliding board transfers  min A overall, supervision for bed and WC mobility  functional mobility/transfers, generalized strengthening, dynamic standing balance/coordination, adherance to WB precautions, and improved activity tolerance.   Communication             Safety/Cognition/ Behavioral Observations            Pain   Pt is currently pain free  Pt will remain pain  free  Will assess qshift and PRN   Skin   Pt has incision on right hip  Pt's incision will heal  Will assess qshift and PRN     Discharge Planning:  Pt somewhat unrealistic regarding her care needs, will require much care and will need to pursue going to a SNF. Will try to get friend in here for education to see if an option to go home   Team Discussion: Patient is not making enough progress to safety discharge home solo and caregiver has not been present; patient does not want to be a burden on others. Needs assistance with care and skin care; stage 2 is healing. Patient on target to meet rehab goals: Functional status fluctuates; left side movement better however progress limited by issues with leaning and sequencing and poor carry over. Almost like she has never met staff or heard material before when she has seen same staff every day, multi times/day and or practiced techniques and learned concepts and used pieces of equipment before although she does require less cues to use equipment than she originally did. Min assist goals set for discharge.  *See Care Plan and progress notes for long and short-term goals.   Revisions to Treatment Plan:  Gait training discontinued; w/c level expected for discharge due to max assist +1 with standing  Teaching Needs: Safety, use of equipment, medications, transfers, etc  Current Barriers to Discharge: Home enviroment access/layout,  Incontinence, Lack of/limited family support, and Weight bearing restrictions.  Possible Resolutions to Barriers: SNF recommended SW to review facilities with patient    Medical Summary Current Status: labile blood pressure, femur fracture pain, acute blood loss anemia, pressure injury, dementia  Barriers to Discharge: Wound care;Medical stability  Barriers to Discharge Comments: labile blood pressure, femur fracture pain, acute blood loss anemia, pressure injury, dementia Possible Resolutions to [employer] Focus:  continue to monitor Blood pressure TID, decrease hydrocodone to 1 tab q8H prn, continue to monitor hemoglobin, continue regular wound care   Continued Need for Acute Rehabilitation Level of Care: The patient requires daily medical management by a physician with specialized training in physical medicine and rehabilitation for the following reasons: Direction of a multidisciplinary physical rehabilitation program to maximize functional independence : Yes Medical management of patient stability for increased activity during participation in an intensive rehabilitation regime.: Yes Analysis of laboratory values and/or radiology reports with any subsequent need for medication adjustment and/or medical intervention. : Yes   I attest that I was present, lead the team conference, and concur with the assessment and plan of the team.   Dorien Chihuahua B 12/19/2020, 4:04 PM

## 2020-12-19 NOTE — Progress Notes (Signed)
Physical Therapy Session Note  Patient Details  Name: Andrea [name] MRN: [id number] Date of Birth: 1925/05/06  Today's Date: 12/19/2020 PT Individual Time: 7106-2694 and 8546-2703 PT Individual Time Calculation (min): 69 min and 25 min  Short Term Goals: Week 1:  PT Short Term Goal 1 (Week 1): pt will transfer supine<>sitting EOB with min A PT Short Term Goal 1 - Progress (Week 1): Met PT Short Term Goal 2 (Week 1): pt will transfer bed<>chair with LRAD and max of 1 PT Short Term Goal 2 - Progress (Week 1): Progressing toward goal PT Short Term Goal 3 (Week 1): pt will transfer sit<>stand with LRAD and max of 1 PT Short Term Goal 3 - Progress (Week 1): Met Week 2:  PT Short Term Goal 1 (Week 2): pt will transfer sit<>supine with min A PT Short Term Goal 2 (Week 2): pt will transfer sit<>stand with LRAD and mod A PT Short Term Goal 3 (Week 2): pt will transfer bed<>chair with LRAD and mod A of 1  Skilled Therapeutic Interventions/Progress Updates:   Treatment Session 1 Received pt sitting in WC eating breakfast with MD present performing morning rounds, pt agreeable to PT treatment, and reported mild discomfort on tailbone and not sleeping well last night but declined any pain medications. Session with emphasis on functional mobility/transfers, generalized strengthening, dynamic standing balance/coordination, and improved activity tolerance. Pt transported to/from room on 5C in [facility] total A for time management and energy conservation purposes. Pt performed seated LLE strengthening on Kinetron at 20 cm/sec for 1 minute x 4 trials with therapist providing counter resistance with emphasis on glute/quad strengthening. Pt transferred WC<>mat to L via slideboard and max A of 1 with max multimodal cues for sequencing. Pt able to maintain RLE NWB precautions throughout transfer but with increased difficulty sliding across board (possibly due to not having L shoe on?) Encouraged pt to try setting up transfer  including placing board, however pt stated "I don't remember how to do this" ultimately requiring total A for set up. Pt performed the following exercises sitting EOM with supervision and verbal cues for technique with emphasis on LE strength and ROM: -hip adduction ball squeezes 2x12 -hip flexion 2x10 bilaterally with 1lb ankle weight  -LAQ 2x10 with 1lb ankle weight  Worked on blocked practice sit<>stands x 4 reps with RW using mirror for visual feedback. First 2 stands with mod/max A +2 with 2nd person's foot in front of LLE to prevent it from sliding forward. Second 2 stands, pt stood with max A of 1 with second person blocking LLE. Pt required cues to scoot to EOM, tucking LLE behind her, and for upright posture/gaze, trunk extension, L knee extension, and pursed lip breathing once standing. Pt then transferred mat<>WC via slideboard to L with CGA to start but mod A to lift up and over Roho cushion. Concluded session with pt sitting in WC, needs within reach, and chair pad alarm on. Provided pt with fresh drink.   Treatment Session 2 Received pt sitting in WC finishing lunch, pt agreeable to PT treatment, and denied any pain during session but requested to get back into bed stating "I just have no energy left". Session with emphasis on functional mobility/transfers, hygiene management, and improved activity tolerance. Pt transferred WC<>bed via slideboard with CGA to L with total A to place board and blocking LLE from sliding forward. Doffed L shoe with max A and transferred sit<>supine with supervision and increased time. Checked to ensure brief was clean  and pt able to slide pants down to hips without assist but required mod A to get legs out of pants. Pt found to be incontinent of urine and sacral dressing soiled. Removed soiled brief and performed peri-care dependently. RN notified and present to apply new sacral dressing. Concluded session with pt L sidelying in bed, needs within reach, and bed  alarm on. Pillows positioned for pressure relief and comfort.   Therapy Documentation Precautions:  Precautions Precautions: Fall Restrictions Weight Bearing Restrictions: Yes RLE Weight Bearing: Non weight bearing  Therapy/Group: Individual Therapy Alfonse Alpers PT, DPT   12/19/2020, 7:28 AM

## 2020-12-19 NOTE — Progress Notes (Signed)
Occupational Therapy Session Note  Patient Details  Name: Andrea [name] MRN: [id number] Date of Birth: 1925/09/07  Today's Date: 12/19/2020 OT Individual Time: 9323-5573 OT Individual Time Calculation (min): 42 min    Short Term Goals: Week 2:  OT Short Term Goal 1 (Week 2): Pt will complete toilet transfer with max assist of one caregiver OT Short Term Goal 2 (Week 2): Pt will complete toileting with max assist OT Short Term Goal 3 (Week 2): Pt will complete LB dressing with mod assist with AE as needed OT Short Term Goal 4 (Week 2): Pt will complete bathing with min assist with AE as needed  Skilled Therapeutic Interventions/Progress Updates:    Pt in wheelchair to start session with transport down to the therapy gym for session.  She was able to complete sliding board transfers from wheelchair to therapy mat with min assist.  Mod demonstrational cueing for forward weightshift over the LLE to assist with easier scooting.  Had her work on scooting on the EOM to both the right and left.  Also worked on sit to stand from the slightly elevated mat with max assist while maintaining NWBing through the RLE.  Finished session with pt returning to the wheelchair via sliding board with min assist with return to the room.  Call button and phone in reach with safety alarm pad in place.    Therapy Documentation Precautions:  Precautions Precautions: Fall Restrictions Weight Bearing Restrictions: Yes RLE Weight Bearing: Non weight bearing  Pain: Pain Assessment Pain Scale: Faces Pain Score: 5  Faces Pain Scale: Hurts a little bit Pain Type: Surgical pain Pain Location: Leg Pain Orientation: Right Pain Descriptors / Indicators: Discomfort Patients Stated Pain Goal: 1 Pain Intervention(s): Repositioned;Medication (See eMAR);Pain med given for lower pain score than stated, per patient request;Relaxation Multiple Pain Sites: No PAINAD (Pain Assessment in Advanced Dementia) Breathing:  normal Negative Vocalization: none Facial Expression: smiling or inexpressive Body Language: relaxed Consolability: no need to console PAINAD Score: 0 ADL: See Care Tool Section for some details of mobility and selfcare   Therapy/Group: Individual Therapy  Aleister Lady OTR/L 12/19/2020, 12:32 PM

## 2020-12-19 NOTE — Progress Notes (Addendum)
PROGRESS NOTE   Subjective/Complaints: No complaints this morning Shares more of her history with me Discussed home health and encouraged her to discuss this further with SW  ROS: Patient denies fever, rash, sore throat, blurred vision, nausea, vomiting, diarrhea, cough, shortness of breath or chest pain, joint or back pain, headache, or mood change.   Objective:   No results found. Recent Labs    12/18/20 0612  WBC 6.5  HGB 10.6*  HCT 34.1*  PLT 425*    No results for input(s): NA, K, CL, CO2, GLUCOSE, BUN, CREATININE, CALCIUM in the last 72 hours.   Intake/Output Summary (Last 24 hours) at 12/19/2020 1003 Last data filed at 12/19/2020 0830 Gross per 24 hour  Intake 592 ml  Output --  Net 592 ml     Pressure Injury 12/05/20 Buttocks Left Stage 2 -  Partial thickness loss of dermis presenting as a shallow open injury with a red, pink wound bed without slough. A stage 2 measuring 1cmx1cm and surrounding skin is nonblaching. entire area measures 8.5cmx (Active)  12/05/20 1700  Location: Buttocks  Location Orientation: Left  Staging: Stage 2 -  Partial thickness loss of dermis presenting as a shallow open injury with a red, pink wound bed without slough.  Wound Description (Comments): A stage 2 measuring 1cmx1cm and surrounding skin is nonblaching. entire area measures 8.5cmx3.7cm.  Present on Admission: Yes    Physical Exam: Vital Signs Blood pressure (!) 165/63, pulse 64, temperature 98.6 F (37 C), temperature source Oral, resp. rate 14, height 5\' 4"  (1.626 m), weight 70.4 kg, SpO2 97 %. Gen: no distress, normal appearing HEENT: oral mucosa pink and moist, NCAT Cardio: Reg rate Chest: normal effort, normal rate of breathing Abd: soft, non-distended Ext: no edema Psych: pleasant, normal affect  Skin: Warm and dry.  Incision with dressing CDI Musc: Right thigh with edema and tenderness, l Neuro: Alert Motor:  Bilateral upper extremities: 4+/5 proximal distal Right lower extremity: Hip flexion, knee extension 2+/5, ankle dorsiflexion 4+/5  Left lower extremity: Hip flexion, knee extension 3+/5, ankle dorsiflexion 4+/5  Assessment/Plan: 1. Functional deficits which require 3+ hours per day of interdisciplinary therapy in a comprehensive inpatient rehab setting. Physiatrist is providing close team supervision and 24 hour management of active medical problems listed below. Physiatrist and rehab team continue to assess barriers to discharge/monitor patient progress toward functional and medical goals  Care Tool:  Bathing    Body parts bathed by patient: Right arm, Left arm, Chest, Abdomen, Face, Right upper leg, Left upper leg   Body parts bathed by helper: Front perineal area, Buttocks     Bathing assist Assist Level: Minimal Assistance - Patient > 75%     Upper Body Dressing/Undressing Upper body dressing   What is the patient wearing?: Pull over shirt    Upper body assist Assist Level: Set up assist    Lower Body Dressing/Undressing Lower body dressing      What is the patient wearing?: Pants, Incontinence brief     Lower body assist Assist for lower body dressing: Moderate Assistance - Patient 50 - 74%     Toileting Toileting    Toileting assist Assist  for toileting: Total Assistance - Patient < 25%     Transfers Chair/bed transfer  Transfers assist     Chair/bed transfer assist level: Moderate Assistance - Patient 50 - 74% Chair/bed transfer assistive device: Sliding board   Locomotion Ambulation   Ambulation assist   Ambulation activity did not occur: Safety/medical concerns          Walk 10 feet activity   Assist  Walk 10 feet activity did not occur: Safety/medical concerns (pain, weakness, decreased balance, poor adherance to RLE NWB precautions)        Walk 50 feet activity   Assist Walk 50 feet with 2 turns activity did not occur:  Safety/medical concerns         Walk 150 feet activity   Assist Walk 150 feet activity did not occur: Safety/medical concerns         Walk 10 feet on uneven surface  activity   Assist Walk 10 feet on uneven surfaces activity did not occur: Safety/medical concerns         Wheelchair     Assist Is the patient using a wheelchair?: Yes Type of Wheelchair: Manual Wheelchair activity did not occur: Safety/medical concerns  Wheelchair assist level: Supervision/Verbal cueing Max wheelchair distance: 35ft    Wheelchair 50 feet with 2 turns activity    Assist    Wheelchair 50 feet with 2 turns activity did not occur: Safety/medical concerns   Assist Level: Moderate Assistance - Patient 50 - 74%   Wheelchair 150 feet activity     Assist  Wheelchair 150 feet activity did not occur: Safety/medical concerns       Blood pressure (!) 165/63, pulse 64, temperature 98.6 F (37 C), temperature source Oral, resp. rate 14, height 5\' 4"  (1.626 m), weight 70.4 kg, SpO2 97 %.  Medical Problem List and Plan: 1.  Right distal femur fracture with intercondylar extension secondary to fall.  Status post ORIF 12/02/2020.  Nonweightbearing  -Continue CIR therapies including PT, OT   Max A 1 standing, will plan for SNF.  2.  Impaired mobility: -DVT/anticoagulation: Vascular study negative.  Pharmaceutical: Continue Lovenox             -antiplatelet therapy: Aspirin 81 mg daily 3. Femur fracture/right knee pain: Scheduled Tylenol 650mg  TID. Decrease Hydrocodone to 1 tab q8H as needed. Voltaren gel 2mg  QID   Controlled on 9/20 4. Mood: Provide emotional support             -antipsychotic agents: N/A 5. Neuropsych: This patient is capable of making decisions on her own behalf. 6. Stage 2 sacral pressure injury: offload q2H, wound care. Continue vitamin C 1,000mg  daily to promote wound healing.  7. Fluids/Electrolytes/Nutrition: Routine in and outs 8.  Acute blood loss anemia.     Hemoglobin 8.2 on 9/7--> 10.6 9/19 9.  Hyperlipidemia: Lipitor 10.  Systolic BP elevated, diastolic BP soft:  discontinue Norvasc. Monitor with increased mobility  Relatively controlled on 9/20 11.  Hypothyroidism.  Synthroid 12.  History of CVA with resulting gait abnormality.  Continue aspirin 13. Overweight BMI 26.98: provide counseling.  14. Urine with foul strong odor- patient is alert and oriented and bright with no urinary complaints, afebrile- no longer has odor, no sx.  15. Constipation: continue colace 100mg  BID 16.  Sleep disturbance: continue melatonin 3mg  HS 17.  Hypoalbuminemia: encourage high protein foods 18. Incontinent of urine: continue bowel and bladder dysfunction.  19: Disposition: d/c to SNF once bed available. HFU scheduled.  LOS: 14 days A FACE TO FACE EVALUATION WAS PERFORMED  Andrea [name] Andrea [name] 12/19/2020, 10:03 AM

## 2020-12-19 NOTE — Progress Notes (Addendum)
Patient ID: Andrea [name], female   DOB: 08-05-1925, 87 y.o.   MRN: [id number]  Met with pt to discuss again her plan for discharge. If in fact her friend will be assisting her then we will need to get her in for therapy and education. Pt feels funny about asking, told her I would call and ask if she wants worker too. Also discussed going to a SNF as an option. She has been to [facility] and this was a nightmare according to pt. Discussed others and will get her a list. Pt seems to forget that we discuss this every other [name]. Will get her a SNF list, she does realize she will need 24/7 care at discharge and can not be home alone. Will continue to work on a safe discharge plan.  3;20 Pm Left message with niece-Danaye to update regarding team conference and discuss plan. Await return call. Pt is aware of team's recommendations and is on board with going to a SNF, although does not remember talking about it

## 2020-12-20 MED ORDER — HYDROCODONE-ACETAMINOPHEN 5-325 MG PO TABS: 1.0000 | ORAL_TABLET | Freq: Two times a day (BID) | ORAL | Status: DC | PRN

## 2020-12-20 MED ORDER — MELATONIN 3 MG PO TABS
3.0000 mg | ORAL_TABLET | Freq: Every day | ORAL | Status: DC
  Administered 2020-12-20 – 2020-12-27 (×8): 3 mg via ORAL
  Filled 2020-12-20 (×8): qty 1

## 2020-12-20 NOTE — Progress Notes (Signed)
Physical Therapy Weekly Progress Note  Patient Details  Name: Andrea [name] MRN: [id number] Date of Birth: 1925-04-07  Beginning of progress report period: December 06, 2020 End of progress report period: December 20, 2020  Today's Date: 12/20/2020 PT Individual Time: 1300-1355 PT Individual Time Calculation (min): 55 min   Patient has met 2 of 3 short term goals. Pt demonstrates slow progress towards long term goals and fluctuates with her mobility status (particularly with transfers). Pt is currently able to perform bed mobility from flat bed using bedrails with supervision. Pt is able to perform slideboard transfers to the L and R with as little as CGA, but can require up to max A (sometimes +2 assist) when fatigued, thus impacting her cognitive ability to comprehend what is being asked of her. Pt is able to transfer sit<>stand with RW and max A of 1 but can require up to +2 assist depending on fatigue. Pt does well adhering to her RLE NWB precautions but demonstrates poor carry over with technique for transfers and standing requiring max multimodal cues for sequencing. Attempted to gather more information regarding close friends who can assist upon discharge, however pt reports being very "independent" and not wanting to rely on anyone for assist. Have discussed with pt this therapist's recommendation to discharge to SNF for additional care.   Patient continues to demonstrate the following deficits muscle weakness and muscle joint tightness, decreased cardiorespiratoy endurance, impaired timing and sequencing, abnormal tone, unbalanced muscle activation, decreased coordination, and decreased motor planning, decreased awareness, decreased problem solving, and decreased memory, and decreased standing balance, decreased postural control, hemiplegia, decreased balance strategies, and difficulty maintaining precautions and therefore will continue to benefit from skilled PT intervention to increase  functional independence with mobility.  Patient progressing toward long term goals..  Continue plan of care.  PT Short Term Goals Week 2:  PT Short Term Goal 1 (Week 2): pt will transfer sit<>supine with min A PT Short Term Goal 1 - Progress (Week 2): Met PT Short Term Goal 2 (Week 2): pt will transfer sit<>stand with LRAD and mod A PT Short Term Goal 2 - Progress (Week 2): Progressing toward goal PT Short Term Goal 3 (Week 2): pt will transfer bed<>chair with LRAD and mod A of 1 PT Short Term Goal 3 - Progress (Week 2): Met Week 3:  PT Short Term Goal 1 (Week 3): pt will transfer bed<>chair with LRAD and min A consistantly PT Short Term Goal 2 (Week 3): pt will perform WC mobility 55f using BUE and supervision PT Short Term Goal 3 (Week 3): pt will transfer sit<>stand with LRAD and mod A of 1  Skilled Therapeutic Interventions/Progress Updates:  Ambulation/gait training;Discharge planning;Functional mobility training;Psychosocial support;Therapeutic Activities;Visual/perceptual remediation/compensation;Balance/vestibular training;Disease management/prevention;Neuromuscular re-education;Skin care/wound management;Therapeutic Exercise;Wheelchair propulsion/positioning;Cognitive remediation/compensation;DME/adaptive equipment instruction;Pain management;Splinting/orthotics;UE/LE Strength taining/ROM;Community reintegration;Functional electrical stimulation;Patient/family education;Stair training;UE/LE Coordination activities   Today's Interventions: Received pt sitting in WC, pt agreeable to PT treatment, and denied any pain during session but reported feeling "sleepy" and did not sleep well again last night. Session with emphasis on functional mobility/transfers, generalized strengthening, dynamic standing balance/coordination, and improved endurance with activity. Donned L shoe with total A and [facility]. Pt transferred WC<>mat via  slideboard to L with CGA and cues for anterior weight shifting. Sit<>stands with RW and max A of 1 x 4 reps with second person preventing L foot from sliding forward and stabilizing RW, while using mirror for visual  feedback. Pt able to scoot to EOM without cues and required only minimal cues for anterior weight shifting and upright posture/gaze today. While standing worked on dynamic balance, reaching outside BOS, and crossing midline grasping horseshoes with LUE and placing them on target on R side x 4 trials with min/mod A for balance due to mild posterior lean. Pt attempted to reach for horseshoe with RUE but demonstrated R lateral LOB due to inability to stabilize due to RLE NWB precautions. Pt occasionally TDWB on RLE requiring occasional cues for NWB. Worked on dynamic sitting balance and core strengthening batting beach ball with 1lb dowel 3x10 reps with supervision for balance. Pt then performed the following exercises sitting EOM with supervision and verbal cues for technique: -horizontal chest press at 90 degrees with 2.2lb medicine ball 2x10 -overhead chest press with 2.2lb medicine ball 2x10 -R hip flexion with 1lb ankle weight 2x10 Mat<>WC via slideboard to R with CGA and transported to ortho gym in [facility] total A. Concluded session with pt sitting in WC in ortho gym awaiting group therapy session.    Therapy Documentation Precautions:  Precautions Precautions: Fall Restrictions Weight Bearing Restrictions: Yes RLE Weight Bearing: Non weight bearing  Therapy/Group: Individual Therapy Alfonse Alpers PT, DPT   12/20/2020, 7:31 AM

## 2020-12-20 NOTE — Progress Notes (Signed)
Occupational Therapy Weekly Progress Note  Patient Details  Name: Andrea [name] MRN: [id number] Date of Birth: 1925/09/21  Beginning of progress report period: December 13, 2020 End of progress report period: December 20, 2020  Today's Date: 12/20/2020 OT Individual Time: 1003-1103 OT Individual Time Calculation (min): 60 min    Patient has met 2 of 4 short term goals.  Pt is making slow, inconsistent progress towards goals.  Pt currently requires mod assist to max +2 for slide board transfers due to decreased recall of weight shifting and sequencing secondary to memory impairments.  Pt requires mod assist for LB dressing at bed level with max multimodal cues for sequencing, problem solving, and use of AE to increase independence with LB dressing.  Pt demonstrates little to no carryover of education from previous sessions, requiring max multimodal cues for mobility and self-care tasks.  Pt has friends who can assist intermittently, however would benefit from 24/7 supervision and physical assistance due to decreased recall of education in regards to mobility and self-care tasks.  Pt would benefit from additional rehab at [facility] post rehab stay.  Patient continues to demonstrate the following deficits: muscle weakness and muscle joint tightness, decreased cardiorespiratoy endurance, decreased memory, and decreased sitting balance, decreased standing balance, decreased balance strategies, and difficulty maintaining precautions and therefore will continue to benefit from skilled OT intervention to enhance overall performance with BADL and Reduce care partner burden.  Patient progressing toward long term goals..  Continue plan of care.  OT Short Term Goals Week 2:  OT Short Term Goal 1 (Week 2): Pt will complete toilet transfer with max assist of one caregiver OT Short Term Goal 1 - Progress (Week 2): Partly met OT Short Term Goal 2 (Week 2): Pt will complete toileting with max assist OT Short Term  Goal 2 - Progress (Week 2): Progressing toward goal OT Short Term Goal 3 (Week 2): Pt will complete LB dressing with mod assist with AE as needed OT Short Term Goal 3 - Progress (Week 2): Met OT Short Term Goal 4 (Week 2): Pt will complete bathing with min assist with AE as needed OT Short Term Goal 4 - Progress (Week 2): Met Week 3:  OT Short Term Goal 1 (Week 3): Pt will complete toilet transfer with max assist of one caregiver OT Short Term Goal 2 (Week 3): Pt will complete toileting with max assist OT Short Term Goal 3 (Week 3): Pt will complete LB dressing with mod assist at sit > stand level  Skilled Therapeutic Interventions/Progress Updates:    Treatment session with focus on trunk control and weight shifting as needed for transfers, toileting, and LB dressing.  Pt received upright in w/c reporting that she has not slept last 2 nights but agreeable to therapy session.  Pt transported to therapy gym and completed slide board transfer to R max assist.  Engaged in lateral scoots along edge of mat with focus on anterior weight shift, initially providing multimodal cues fading to verbal cues.  Pt able to scoot to L but not R without additional assistance.  Pt transitioned to supine with supervision.  Engaged in single leg bridging with RLE over bolster to maintain NWB while focusing on glute strengthening as needed to weight shift and transfer to pt's R.  Pt completed 3 sets of 5 with cues for technique due to minimal carryover.  Returned to sitting upright with supervision.  Therapist directed pt in lateral leans to L with focus on lifting hip to  allow for simulated clothing management and hygiene with intermittent cues for trunk control as pt with tendency to lean backwards.  Pt transferred back to w/c via slide board with min assist to maintain anterior weight shift.  Pt remained upright in w/c with chair alarm on and all needs in reach.  Therapy Documentation Precautions:   Precautions Precautions: Fall Restrictions Weight Bearing Restrictions: Yes RLE Weight Bearing: Non weight bearing General:   Vital Signs: Therapy Vitals Temp: 98.1 F (36.7 C) Temp Source: Oral Pulse Rate: 69 Resp: 14 BP: (!) 160/73 Patient Position (if appropriate): Lying Oxygen Therapy SpO2: 97 % O2 Device: Room Air Pain:  Pt with no c/o pain   Therapy/Group: Individual Therapy  Simonne Come 12/20/2020, 8:37 AM

## 2020-12-20 NOTE — Progress Notes (Signed)
Occupational Therapy Session Note  Patient Details  Name: Andrea [name] MRN: [id number] Date of Birth: 10/25/1925  Today's Date: 12/20/2020 OT Group Time: 1401-1501 OT Group Time Calculation (min): 60 min   Short Term Goals: Week 3:  OT Short Term Goal 1 (Week 3): Pt will complete toilet transfer with max assist of one caregiver OT Short Term Goal 2 (Week 3): Pt will complete toileting with max assist OT Short Term Goal 3 (Week 3): Pt will complete LB dressing with mod assist at sit > stand level  Skilled Therapeutic Interventions/Progress Updates:  Pt participated in group session with a focus on stress mgmt, education on healthy coping strategies, and social interaction. Focus of session on providing coping strategies to manage new current level of function as a result of new diagnosis.  Session focus on breaking down stressors into "daily hassles," "major life stressors" and "life circumstances" in an effort to allow pts to chunk their stressors into groups. Pt actively sharing stressors and contributing to group conversation. Pt sharing examples of current stressors such as having too many visitors and wanting to be left alone to focus her attention on her therapy. Offered education on factors that protect [place] against stress such as "daily uplifts," "healthy coping strategies" and "protective factors." Encouraged all group members to make an effort to actively recall one event from their day that was a daily uplift in an effort to protect their mindset from stressors. Issued pt handouts on healthy coping strategies to implement into routine. Pt transported back to room by RT.  Therapy Documentation Precautions:  Precautions Precautions: Fall Restrictions Weight Bearing Restrictions: Yes RLE Weight Bearing: Non weight bearing  Pain: pt reports no pain during group session.    Therapy/Group: Group Therapy  Barron Schmid 12/20/2020, 3:56 PM

## 2020-12-20 NOTE — Progress Notes (Signed)
PROGRESS NOTE   Subjective/Complaints: Slept poorly last 2 nights and states she is feeling "drunk" though still cheerful, alert, and laughing. Discussed starting melatonin 3mg  for her at night and she is agreeable She has no other complaints.   ROS: Patient denies fever, rash, sore throat, blurred vision, nausea, vomiting, diarrhea, cough, shortness of breath or chest pain, joint or back pain, headache, or mood change.   Objective:   No results found. Recent Labs    12/18/20 0612  WBC 6.5  HGB 10.6*  HCT 34.1*  PLT 425*    No results for input(s): NA, K, CL, CO2, GLUCOSE, BUN, CREATININE, CALCIUM in the last 72 hours.   Intake/Output Summary (Last 24 hours) at 12/20/2020 1111 Last data filed at 12/20/2020 0830 Gross per 24 hour  Intake 360 ml  Output --  Net 360 ml     Pressure Injury 12/05/20 Buttocks Left Stage 2 -  Partial thickness loss of dermis presenting as a shallow open injury with a red, pink wound bed without slough. A stage 2 measuring 1cmx1cm and surrounding skin is nonblaching. entire area measures 8.5cmx (Active)  12/05/20 1700  Location: Buttocks  Location Orientation: Left  Staging: Stage 2 -  Partial thickness loss of dermis presenting as a shallow open injury with a red, pink wound bed without slough.  Wound Description (Comments): A stage 2 measuring 1cmx1cm and surrounding skin is nonblaching. entire area measures 8.5cmx3.7cm.  Present on Admission: Yes    Physical Exam: Vital Signs Blood pressure (!) 160/73, pulse 69, temperature 98.1 F (36.7 C), temperature source Oral, resp. rate 14, height 5\' 4"  (1.626 m), weight 72.4 kg, SpO2 97 %. Gen: no distress, normal appearing HEENT: oral mucosa pink and moist, NCAT Cardio: Reg rate Chest: normal effort, normal rate of breathing Abd: soft, non-distended Ext: no edema Psych: pleasant, normal affect  Skin: Warm and dry.  Incision with dressing  CDI Musc: Right thigh with edema and tenderness, l Neuro: Alert Motor: Bilateral upper extremities: 4+/5 proximal distal Right lower extremity: Hip flexion, knee extension 2+/5, ankle dorsiflexion 4+/5  Left lower extremity: Hip flexion, knee extension 3+/5, ankle dorsiflexion 4+/5  Assessment/Plan: 1. Functional deficits which require 3+ hours per day of interdisciplinary therapy in a comprehensive inpatient rehab setting. Physiatrist is providing close team supervision and 24 hour management of active medical problems listed below. Physiatrist and rehab team continue to assess barriers to discharge/monitor patient progress toward functional and medical goals  Care Tool:  Bathing    Body parts bathed by patient: Right arm, Left arm, Chest, Abdomen, Face, Right upper leg, Left upper leg   Body parts bathed by helper: Front perineal area, Buttocks     Bathing assist Assist Level: Minimal Assistance - Patient > 75%     Upper Body Dressing/Undressing Upper body dressing   What is the patient wearing?: Pull over shirt    Upper body assist Assist Level: Set up assist    Lower Body Dressing/Undressing Lower body dressing      What is the patient wearing?: Pants, Incontinence brief     Lower body assist Assist for lower body dressing: Moderate Assistance - Patient 50 - 74%  Toileting Toileting    Toileting assist Assist for toileting: Total Assistance - Patient < 25%     Transfers Chair/bed transfer  Transfers assist     Chair/bed transfer assist level: Moderate Assistance - Patient 50 - 74% Chair/bed transfer assistive device: Sliding board   Locomotion Ambulation   Ambulation assist   Ambulation activity did not occur: Safety/medical concerns          Walk 10 feet activity   Assist  Walk 10 feet activity did not occur: Safety/medical concerns (pain, weakness, decreased balance, poor adherance to RLE NWB precautions)        Walk 50 feet  activity   Assist Walk 50 feet with 2 turns activity did not occur: Safety/medical concerns         Walk 150 feet activity   Assist Walk 150 feet activity did not occur: Safety/medical concerns         Walk 10 feet on uneven surface  activity   Assist Walk 10 feet on uneven surfaces activity did not occur: Safety/medical concerns         Wheelchair     Assist Is the patient using a wheelchair?: Yes Type of Wheelchair: Manual Wheelchair activity did not occur: Safety/medical concerns  Wheelchair assist level: Supervision/Verbal cueing Max wheelchair distance: 67ft    Wheelchair 50 feet with 2 turns activity    Assist    Wheelchair 50 feet with 2 turns activity did not occur: Safety/medical concerns   Assist Level: Moderate Assistance - Patient 50 - 74%   Wheelchair 150 feet activity     Assist  Wheelchair 150 feet activity did not occur: Safety/medical concerns       Blood pressure (!) 160/73, pulse 69, temperature 98.1 F (36.7 C), temperature source Oral, resp. rate 14, height 5\' 4"  (1.626 m), weight 72.4 kg, SpO2 97 %.  Medical Problem List and Plan: 1.  Right distal femur fracture with intercondylar extension secondary to fall.  Status post ORIF 12/02/2020.  Nonweightbearing  -Continue CIR therapies including PT, OT   Max A 1 standing, will plan for SNF.  2.  Impaired mobility: -DVT/anticoagulation: Vascular study negative.  Pharmaceutical: Continue Lovenox             -antiplatelet therapy: Aspirin 81 mg daily 3. Femur fracture/right knee pain: Scheduled Tylenol 650mg  TID. Decrease Hydrocodone to 1 tab q12H as needed. Voltaren gel 2mg  QID   Controlled on 9/21 4. Mood: Provide emotional support             -antipsychotic agents: N/A 5. Neuropsych: This patient is capable of making decisions on her own behalf. 6. Stage 2 sacral pressure injury: offload q2H, wound care. Continue vitamin C 1,000mg  daily to promote wound healing.  7.  Fluids/Electrolytes/Nutrition: Routine in and outs 8.  Acute blood loss anemia.    Hemoglobin 8.2 on 9/7--> 10.6 9/19 9.  Hyperlipidemia: Lipitor 10.  Systolic BP elevated, diastolic BP soft:  discontinue Norvasc. Monitor with increased mobility  Relatively controlled on 9/20 11.  Hypothyroidism.  Synthroid 12.  History of CVA with resulting gait abnormality.  Continue aspirin 13. Overweight BMI 26.98: provide counseling.  14. Urine with foul strong odor- patient is alert and oriented and bright with no urinary complaints, afebrile- no longer has odor, no sx.  15. Constipation: continue colace 100mg  BID 16.  Sleep disturbance: restart melatonin 3mg  HS 17.  Hypoalbuminemia: encourage high protein foods 18. Incontinent of urine: continue bowel and bladder dysfunction.  19: Disposition: d/c  to SNF once bed available. HFU scheduled.   LOS: 15 days A FACE TO FACE EVALUATION WAS PERFORMED  Revere Maahs P [place] Liz 12/20/2020, 11:11 AM

## 2020-12-20 NOTE — Progress Notes (Signed)
Physical Therapy Session Note  Patient Details  Name: Andrea [name] MRN: [id number] Date of Birth: 11-17-1925  Today's Date: 12/20/2020 PT Individual Time: 1607-3710 PT Individual Time Calculation (min): 28 min   Short Term Goals: Week 1:  PT Short Term Goal 1 (Week 1): pt will transfer supine<>sitting EOB with min A PT Short Term Goal 1 - Progress (Week 1): Met PT Short Term Goal 2 (Week 1): pt will transfer bed<>chair with LRAD and max of 1 PT Short Term Goal 2 - Progress (Week 1): Progressing toward goal PT Short Term Goal 3 (Week 1): pt will transfer sit<>stand with LRAD and max of 1 PT Short Term Goal 3 - Progress (Week 1): Met Week 2:  PT Short Term Goal 1 (Week 2): pt will transfer sit<>supine with min A PT Short Term Goal 1 - Progress (Week 2): Met PT Short Term Goal 2 (Week 2): pt will transfer sit<>stand with LRAD and mod A PT Short Term Goal 2 - Progress (Week 2): Progressing toward goal PT Short Term Goal 3 (Week 2): pt will transfer bed<>chair with LRAD and mod A of 1 PT Short Term Goal 3 - Progress (Week 2): Met  Skilled Therapeutic Interventions/Progress Updates:  Pt received supine in bed, denied pain but reported she has not slept well in two days and is very fatigued. Pt reported soiled brief, performed several rolls to L & R w/CGA and bedrail for brief change and LE dressing w/max A. Supine <>sit EOB w/CGA and elevated HOB. Lateral board transfer to L side w/total A for set-up, as pt unable to recall how to properly set up transfer. Min A to perform transfer, multimodal cues for motor planning, anterolateral weight shift and hand placement. Max A to readjust hips in WC due to pt fatigue. Pt was left seated in WC in room, denied pain, all needs in reach.   Therapy Documentation Precautions:  Precautions Precautions: Fall Restrictions Weight Bearing Restrictions: Yes RLE Weight Bearing: Non weight bearing    Therapy/Group: Individual Therapy Cruzita Lederer Shontae Rosiles, PT,  DPT  12/20/2020, 8:46 AM

## 2020-12-20 NOTE — NC FL2 (Signed)
[place] MEDICAID FL2 LEVEL OF CARE SCREENING TOOL     IDENTIFICATION  Patient Name: Andrea [name] Birthdate: 09-08-1925 Sex: female Admission Date (Current Location): 12/05/2020  [place] and [place] Number:  Roda Shutters [id number] Facility and Address:  [facility]. [facility], [address] [place], [place] [address]      Provider Number: [id number]  Attending Physician Name and Address:  Horton Chin, MD  Relative Name and Phone Number:  Ameyah-niece [phone]    Current Level of Care: Other (Comment) (Rehab) Recommended Level of Care: Skilled Nursing Facility Prior Approval Number:    Date Approved/Denied:   PASRR Number: [id number]  Discharge Plan: SNF    Current Diagnoses: Patient Active Problem List   Diagnosis Date Noted   Hypoalbuminemia due to protein-calorie malnutrition (HCC)    Acute blood loss anemia    Postoperative pain    Femur fracture (HCC) 12/05/2020   Pressure injury of skin 12/05/2020   Femur fracture, right (HCC) 11/29/2020   Leukocytosis 11/29/2020   History of stroke 11/29/2020   Hyperlipidemia 11/29/2020   GERD (gastroesophageal reflux disease) 11/29/2020   Contracture of right knee 03/06/2017   Gait disturbance, post-stroke 03/06/2017   Right thalamic infarction (HCC) 01/28/2017   Thalamic stroke (HCC)    TIA (transient ischemic attack) 01/25/2017   Hypernatremia 01/25/2017   CVA (cerebral vascular accident) (HCC) 01/25/2017   Trigger middle finger of left hand 03/20/2016   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   Hypothyroidism 09/01/2014   Hypertension 09/01/2014    Orientation RESPIRATION BLADDER Height & Weight     Self, Situation, Place  Normal Incontinent Weight: 159 lb 9.8 oz (72.4 kg) Height:  5\' 4"  (162.6 cm)  BEHAVIORAL SYMPTOMS/MOOD NEUROLOGICAL BOWEL NUTRITION STATUS      Continent Diet (Regular Thin liquids)  AMBULATORY STATUS COMMUNICATION OF NEEDS Skin   Extensive Assist Verbally  Surgical wounds                       Personal Care Assistance Level of Assistance  Bathing, Dressing Bathing Assistance: Limited assistance Feeding assistance: Independent Dressing Assistance: Limited assistance     Functional Limitations Info    Sight Info: Adequate Hearing Info: Adequate Speech Info: Adequate    SPECIAL CARE FACTORS FREQUENCY  Bowel and bladder program     PT Frequency: 5x week OT Frequency: 5x week Bowel and Bladder Program Frequency: Timed tolieting every 2-3 hours          Contractures Contractures Info: Not present    Additional Factors Info  Code Status, Allergies Code Status Info: Full Code Allergies Info: NKDA           Current Medications (12/20/2020):  This is the current hospital active medication list Current Facility-Administered Medications  Medication Dose Route Frequency Provider Last Rate Last Admin   acetaminophen (TYLENOL) tablet 650 mg  650 mg Oral TID 12/22/2020, MD   650 mg at 12/20/20 0816   ascorbic acid (VITAMIN C) tablet 1,000 mg  1,000 mg Oral Daily 12/22/20, PA-C   1,000 mg at 12/20/20 12/22/20   aspirin EC tablet 81 mg  81 mg Oral Daily 5400, PA-C   81 mg at 12/20/20 0816   atorvastatin (LIPITOR) tablet 40 mg  40 mg Oral q1800 12/22/20, PA-C   40 mg at 12/19/20 1814   bisacodyl (DULCOLAX) EC tablet 5 mg  5 mg Oral Daily PRN 12/21/20, PA-C  5 mg at 12/16/20 9688   diclofenac Sodium (VOLTAREN) 1 % topical gel 2 g  2 g Topical QID Horton Chin, MD   2 g at 12/20/20 1217   docusate sodium (COLACE) capsule 100 mg  100 mg Oral BID Charlton Amor, PA-C   100 mg at 12/20/20 0816   enoxaparin (LOVENOX) injection 40 mg  40 mg Subcutaneous Q24H Charlton Amor, PA-C   40 mg at 12/20/20 6484   famotidine (PEPCID) tablet 20 mg  20 mg Oral QHS Charlton Amor, PA-C   20 mg at 12/19/20 2016   HYDROcodone-acetaminophen (NORCO/VICODIN) 5-325 MG per tablet 1 tablet  1  tablet Oral Q12H PRN Horton Chin, MD       levothyroxine (SYNTHROID) tablet 88 mcg  88 mcg Oral Daily Charlton Amor, PA-C   88 mcg at 12/20/20 7207   melatonin tablet 3 mg  3 mg Oral QHS Raulkar, Drema Pry, MD       multivitamin with minerals tablet 1 tablet  1 tablet Oral Daily Charlton Amor, PA-C   1 tablet at 12/20/20 0816   ondansetron (ZOFRAN) tablet 4 mg  4 mg Oral Q6H PRN Charlton Amor, PA-C       Or   ondansetron [facility]) injection 4 mg  4 mg Intravenous Q6H PRN Charlton Amor, PA-C         Discharge Medications: Please see discharge summary for a list of discharge medications.  Relevant Imaging Results:  Relevant Lab Results:   Additional Information SSN: [id number]  Krayton Wortley, Lemar Livings, LCSW

## 2020-12-20 NOTE — Progress Notes (Signed)
Patient requesting something for sleep.  Patient states she is having trouble sleeping.

## 2020-12-21 MED ORDER — HYDROCODONE-ACETAMINOPHEN 5-325 MG PO TABS
1.0000 | ORAL_TABLET | Freq: Every day | ORAL | Status: DC | PRN
  Administered 2020-12-25 – 2020-12-27 (×2): 1 via ORAL
  Filled 2020-12-21 (×2): qty 1

## 2020-12-21 NOTE — Progress Notes (Signed)
Physical Therapy Session Note  Patient Details  Name: Andrea [name] MRN: [id number] Date of Birth: [birth year]  Today's Date: 12/21/2020 PT Individual Time: 0900-0956 PT Individual Time Calculation (min): 56 min   Short Term Goals: Week 2:  PT Short Term Goal 1 (Week 2): pt will transfer sit<>supine with min A PT Short Term Goal 1 - Progress (Week 2): Met PT Short Term Goal 2 (Week 2): pt will transfer sit<>stand with LRAD and mod A PT Short Term Goal 2 - Progress (Week 2): Progressing toward goal PT Short Term Goal 3 (Week 2): pt will transfer bed<>chair with LRAD and mod A of 1 PT Short Term Goal 3 - Progress (Week 2): Met Week 3:  PT Short Term Goal 1 (Week 3): pt will transfer bed<>chair with LRAD and min A consistantly PT Short Term Goal 2 (Week 3): pt will perform WC mobility 82f using BUE and supervision PT Short Term Goal 3 (Week 3): pt will transfer sit<>stand with LRAD and mod A of 1  Skilled Therapeutic Interventions/Progress Updates:   Received pt semi-reclined in bed, pt agreeable to PT treatment, and denied any pain during session and reported actually sleeping well last night. Session with emphasis on hygiene management, dressing, functional mobility/transfers, generalized strengthening, WC mobility, and improved activity tolerance. Checked to ensure brief was clean and pt found to be incontinent of urine. Removed soiled brief and dressing and performed peri-care dependently. RN notified and present to apply new sacral dressing. Rolled L/R with supervision and use of bedrails and donned clean brief and pants with max A but pt able to assist in pulling pants over hips. Pt transferred supine<>sitting EOB from flat bed with supervision and use of bedrails. MD present for morning rounds and donned L shoe with total A. Pt transferred bed<>WC via slideboard to R with CGA overall. Pt able to place slideboard with mod cues and able to remove board without any cues. Pt continues to require  cues for anterior weight shifting and hand positioning on board during transfers. Pt performed WC mobility 477fusing BUE and supervision with significantly increased time with emphasis on endurance, coordination, and functional use of LUE. Pt with tendency to veer towards left due to prior CVA and required cues for propulsion technique and to increase stride; as pt with short, inefficient strokes. [facility] the [facility] and verbal cues for technique with emphasis on strength and ROM: -hip flexion 2x15 bilaterally  -LAQ 2x15 bilaterally  Concluded session with pt sitting in WC, needs within reach, and chair pad alarm on.   Therapy Documentation Precautions:  Precautions Precautions: Fall Restrictions Weight Bearing Restrictions: Yes RLE Weight Bearing: Non weight bearing  Therapy/Group: Individual Therapy AnAlfonse AlpersT, DPT   12/21/2020, 7:22 AM

## 2020-12-21 NOTE — Progress Notes (Signed)
Occupational Therapy Session Note  Patient Details  Name: Phylliss Strege MRN: [id number] Date of Birth: [birth year]  Today's Date: 12/21/2020 OT Individual Time: 1000-1100 OT Individual Time Calculation (min): 60 min    Short Term Goals: Week 3:  OT Short Term Goal 1 (Week 3): Pt will complete toilet transfer with max assist of one caregiver OT Short Term Goal 2 (Week 3): Pt will complete toileting with max assist OT Short Term Goal 3 (Week 3): Pt will complete LB dressing with mod assist at sit > stand level  Skilled Therapeutic Interventions/Progress Updates:  Pt greeted seated in w/c agreeable to OT intervention. Pt declined ADL participation, pt transported to day room with total A. Pt completed SB transfer to L side with CGA, pt unable to teach back set- up method for SB transfer. Worked on lateral leans from mat table as precursor to posterior pericare with pt able to lean laterally R<>L with CGA to retrieve bean bags underneath pts buttock. Attempted multiple sit<>stands from EOM with pt unable to come into full stand as pt with difficulty shifting weight anteriorly and transitioning UEs from EOM to RW. Pt completed additional SB transfer back to w/c with MOD A going towards pts R side. Pt transported back to room with total A where remainder of session focus on SB transfer to drop arm 3n1. Pt able to scoot to pts L side with MODA. Pt with difficulty carrying over education as pt with decreased ability to acknowledge that we were working on the same type of transfer in the gym just now in a different setting. Pt completed one more SB transfer from w/c>EOB with CGA going towards L side. Pt left supine in bed with bed alarm activated and all needs within reach.   Therapy Documentation Precautions:  Precautions Precautions: Fall Restrictions Weight Bearing Restrictions: Yes RLE Weight Bearing: Non weight bearing  Pain: pt reports no pain during session    Therapy/Group: Individual  Therapy  Barron Schmid 12/21/2020, 12:12 PM

## 2020-12-21 NOTE — Progress Notes (Signed)
PROGRESS NOTE   Subjective/Complaints: No complaints this morning Slept better last night Denies pain this morning Agreeable to SNF  ROS: Patient denies fever, rash, sore throat, blurred vision, nausea, vomiting, diarrhea, cough, shortness of breath or chest pain, joint or back pain, headache, or mood change, insomnia  Objective:   No results found. No results for input(s): WBC, HGB, HCT, PLT in the last 72 hours.   No results for input(s): NA, K, CL, CO2, GLUCOSE, BUN, CREATININE, CALCIUM in the last 72 hours.   Intake/Output Summary (Last 24 hours) at 12/21/2020 1055 Last data filed at 12/21/2020 0740 Gross per 24 hour  Intake 540 ml  Output --  Net 540 ml     Pressure Injury 12/05/20 Buttocks Left Stage 2 -  Partial thickness loss of dermis presenting as a shallow open injury with a red, pink wound bed without slough. A stage 2 measuring 1cmx1cm and surrounding skin is nonblaching. entire area measures 8.5cmx (Active)  12/05/20 1700  Location: Buttocks  Location Orientation: Left  Staging: Stage 2 -  Partial thickness loss of dermis presenting as a shallow open injury with a red, pink wound bed without slough.  Wound Description (Comments): A stage 2 measuring 1cmx1cm and surrounding skin is nonblaching. entire area measures 8.5cmx3.7cm.  Present on Admission: Yes    Physical Exam: Vital Signs Blood pressure (!) 155/58, pulse 62, temperature 97.6 F (36.4 C), temperature source Oral, resp. rate 17, height 5\' 4"  (1.626 m), weight 72.4 kg, SpO2 97 %. Gen: no distress, normal appearing HEENT: oral mucosa pink and moist, NCAT Cardio: Reg rate Chest: normal effort, normal rate of breathing Abd: soft, non-distended Ext: no edema Psych: pleasant, normal affect   Skin: Warm and dry.  Incision with dressing CDI Musc: Right thigh with edema and tenderness, l Neuro: Alert Motor: Bilateral upper extremities: 4+/5  proximal distal Right lower extremity: Hip flexion, knee extension 2+/5, ankle dorsiflexion 4+/5  Left lower extremity: Hip flexion, knee extension 3+/5, ankle dorsiflexion 4+/5  Assessment/Plan: 1. Functional deficits which require 3+ hours per day of interdisciplinary therapy in a comprehensive inpatient rehab setting. Physiatrist is providing close team supervision and 24 hour management of active medical problems listed below. Physiatrist and rehab team continue to assess barriers to discharge/monitor patient progress toward functional and medical goals  Care Tool:  Bathing    Body parts bathed by patient: Right arm, Left arm, Chest, Abdomen, Face, Right upper leg, Left upper leg   Body parts bathed by helper: Front perineal area, Buttocks     Bathing assist Assist Level: Minimal Assistance - Patient > 75%     Upper Body Dressing/Undressing Upper body dressing   What is the patient wearing?: Pull over shirt    Upper body assist Assist Level: Set up assist    Lower Body Dressing/Undressing Lower body dressing      What is the patient wearing?: Pants, Incontinence brief     Lower body assist Assist for lower body dressing: Moderate Assistance - Patient 50 - 74%     Toileting Toileting    Toileting assist Assist for toileting: Total Assistance - Patient < 25%     Transfers Chair/bed  transfer  Transfers assist     Chair/bed transfer assist level: Contact Guard/Touching assist Chair/bed transfer assistive device: Sliding board   Locomotion Ambulation   Ambulation assist   Ambulation activity did not occur: Safety/medical concerns          Walk 10 feet activity   Assist  Walk 10 feet activity did not occur: Safety/medical concerns (pain, weakness, decreased balance, poor adherance to RLE NWB precautions)        Walk 50 feet activity   Assist Walk 50 feet with 2 turns activity did not occur: Safety/medical concerns         Walk 150 feet  activity   Assist Walk 150 feet activity did not occur: Safety/medical concerns         Walk 10 feet on uneven surface  activity   Assist Walk 10 feet on uneven surfaces activity did not occur: Safety/medical concerns         Wheelchair     Assist Is the patient using a wheelchair?: Yes Type of Wheelchair: Manual Wheelchair activity did not occur: Safety/medical concerns  Wheelchair assist level: Supervision/Verbal cueing Max wheelchair distance: 77ft    Wheelchair 50 feet with 2 turns activity    Assist    Wheelchair 50 feet with 2 turns activity did not occur: Safety/medical concerns   Assist Level: Moderate Assistance - Patient 50 - 74%   Wheelchair 150 feet activity     Assist  Wheelchair 150 feet activity did not occur: Safety/medical concerns       Blood pressure (!) 155/58, pulse 62, temperature 97.6 F (36.4 C), temperature source Oral, resp. rate 17, height 5\' 4"  (1.626 m), weight 72.4 kg, SpO2 97 %.  Medical Problem List and Plan: 1.  Right distal femur fracture with intercondylar extension secondary to fall.  Status post ORIF 12/02/2020.  Nonweightbearing  Continue CIR therapies including PT, OT   Max A 1 standing, will plan for SNF.  2.  Impaired mobility: -DVT/anticoagulation: Vascular study negative.  Pharmaceutical: Continue Lovenox             -antiplatelet therapy: Aspirin 81 mg daily 3. Femur fracture/right knee pain: Scheduled Tylenol 650mg  TID. Decrease Hydrocodone to 1 tab daily as needed. Voltaren gel 2mg  QID   Controlled on 9/22 4. Mood: Provide emotional support             -antipsychotic agents: N/A 5. Neuropsych: This patient is capable of making decisions on her own behalf. 6. Stage 2 sacral pressure injury: offload q2H, wound care. Continue vitamin C 1,000mg  daily to promote wound healing.  7. Fluids/Electrolytes/Nutrition: Routine in and outs 8.  Acute blood loss anemia.    Hemoglobin 8.2 on 9/7--> 10.6 9/19 9.   Hyperlipidemia: Lipitor 10.  Systolic BP elevated, diastolic BP soft:  discontinue Norvasc. Monitor with increased mobility  Relatively controlled on 9/20 11.  Hypothyroidism.  Synthroid 12.  History of CVA with resulting gait abnormality.  Continue aspirin 13. Overweight BMI 26.98: provide counseling.  14. Urine with foul strong odor- patient is alert and oriented and bright with no urinary complaints, afebrile- no longer has odor, no sx.  15. Constipation: continue colace 100mg  BID 16.  Sleep disturbance: restart melatonin 3mg  HS 17.  Hypoalbuminemia: encourage high protein foods 18. Incontinent of urine: continue bowel and bladder dysfunction.  19: Disposition: d/c to SNF once bed available. HFU scheduled.   LOS: 16 days A FACE TO FACE EVALUATION WAS PERFORMED  10/22 Almadelia Looman 12/21/2020, 10:55 AM

## 2020-12-21 NOTE — Progress Notes (Signed)
Occupational Therapy Session Note  Patient Details  Name: Andrea [name] MRN: [id number] Date of Birth: 1925/06/15  Today's Date: 12/21/2020 OT Individual Time: 1421-1534 OT Individual Time Calculation (min): 73 min    Short Term Goals: Week 3:  OT Short Term Goal 1 (Week 3): Pt will complete toilet transfer with max assist of one caregiver OT Short Term Goal 2 (Week 3): Pt will complete toileting with max assist OT Short Term Goal 3 (Week 3): Pt will complete LB dressing with mod assist at sit > stand level  Skilled Therapeutic Interventions/Progress Updates:  Pt greeted asleep in supine, easily able to arouse and agreeable to OT intervention. Session focus on various therapeutic activities focused on dynamic sitting balance, problem solving and functional transfers. Pt completed SB transfer from EOB >w/c with CGA. Pt transported to therapy gym with total A for time mgmt. Worked on dynamic sitting balance using BITS with pt reaching out of BOS to tap stimulus with RUE and LUE. Pt scored a 60% accuracy with R hand, and 52% accuracy with her L hand (hemiparetic side). Pt enjoyed activity and completed task with supervision. Attempted to add in cognitive component  where pt to listen to work and then locate on board however pt unable to hear words, therefore downgraded task to have pt sequence through letters of alphabet with pt noted to use LUE to complete task and scoring 72% accuracy. Remainder of session to focus on transfer training with pt completing multiple SB tranfers with CGA to R and L side. Worked on anterior weight shifts from EOM as precursor to functional sit<>stands, pt instructed to place matching card on boards while shifting weight forward. Pt was able to sit<>stand x2 from EOM with MAX A with RW however pt unable to complete any reaching activities in standing. Pt returned to room with total A where pt left in bed with bed alarm activated and all needs within reach   Therapy  Documentation Precautions:  Precautions Precautions: Fall Restrictions Weight Bearing Restrictions: Yes RLE Weight Bearing: Non weight bearing  Pain: Pt reports no pain during session   Therapy/Group: Individual Therapy  [facility] 12/21/2020, 4:01 PM

## 2020-12-22 NOTE — Progress Notes (Signed)
PROGRESS NOTE   Subjective/Complaints: Tossing and turning again last night but in good mood today Has no new complaints  ROS: Patient denies fever, rash, sore throat, blurred vision, nausea, vomiting, diarrhea, cough, shortness of breath or chest pain, joint or back pain, headache, or mood change, + insomnia  Objective:   No results found. No results for input(s): WBC, HGB, HCT, PLT in the last 72 hours.   No results for input(s): NA, K, CL, CO2, GLUCOSE, BUN, CREATININE, CALCIUM in the last 72 hours.  No intake or output data in the 24 hours ending 12/22/20 1250    Pressure Injury 12/05/20 Buttocks Left Stage 2 -  Partial thickness loss of dermis presenting as a shallow open injury with a red, pink wound bed without slough. A stage 2 measuring 1cmx1cm and surrounding skin is nonblaching. entire area measures 8.5cmx (Active)  12/05/20 1700  Location: Buttocks  Location Orientation: Left  Staging: Stage 2 -  Partial thickness loss of dermis presenting as a shallow open injury with a red, pink wound bed without slough.  Wound Description (Comments): A stage 2 measuring 1cmx1cm and surrounding skin is nonblaching. entire area measures 8.5cmx3.7cm.  Present on Admission: Yes    Physical Exam: Vital Signs Blood pressure 130/72, pulse 61, temperature 97.8 F (36.6 C), temperature source Oral, resp. rate 18, height 5\' 4"  (1.626 m), weight 72.4 kg, SpO2 97 %. Gen: no distress, normal appearing HEENT: oral mucosa pink and moist, NCAT Cardio: Reg rate Chest: normal effort, normal rate of breathing Abd: soft, non-distended Ext: no edema Psych: pleasant, normal affect   Skin: Warm and dry.  Incision with dressing CDI, sacral pressure injury Musc: Right thigh with edema and tenderness, l Neuro: Alert Motor: Bilateral upper extremities: 4+/5 proximal distal Right lower extremity: Hip flexion, knee extension 2+/5, ankle  dorsiflexion 4+/5  Left lower extremity: Hip flexion, knee extension 3+/5, ankle dorsiflexion 4+/5  Assessment/Plan: 1. Functional deficits which require 3+ hours per day of interdisciplinary therapy in a comprehensive inpatient rehab setting. Physiatrist is providing close team supervision and 24 hour management of active medical problems listed below. Physiatrist and rehab team continue to assess barriers to discharge/monitor patient progress toward functional and medical goals  Care Tool:  Bathing    Body parts bathed by patient: Right arm, Left arm, Chest, Abdomen, Face, Right upper leg, Left upper leg   Body parts bathed by helper: Front perineal area, Buttocks     Bathing assist Assist Level: Minimal Assistance - Patient > 75%     Upper Body Dressing/Undressing Upper body dressing   What is the patient wearing?: Pull over shirt    Upper body assist Assist Level: Set up assist    Lower Body Dressing/Undressing Lower body dressing      What is the patient wearing?: Pants, Incontinence brief     Lower body assist Assist for lower body dressing: Moderate Assistance - Patient 50 - 74%     Toileting Toileting    Toileting assist Assist for toileting: Total Assistance - Patient < 25%     Transfers Chair/bed transfer  Transfers assist     Chair/bed transfer assist level: Contact Guard/Touching assist Chair/bed  transfer assistive device: Sliding board   Locomotion Ambulation   Ambulation assist   Ambulation activity did not occur: Safety/medical concerns          Walk 10 feet activity   Assist  Walk 10 feet activity did not occur: Safety/medical concerns (pain, weakness, decreased balance, poor adherance to RLE NWB precautions)        Walk 50 feet activity   Assist Walk 50 feet with 2 turns activity did not occur: Safety/medical concerns         Walk 150 feet activity   Assist Walk 150 feet activity did not occur: Safety/medical  concerns         Walk 10 feet on uneven surface  activity   Assist Walk 10 feet on uneven surfaces activity did not occur: Safety/medical concerns         Wheelchair     Assist Is the patient using a wheelchair?: Yes Type of Wheelchair: Manual Wheelchair activity did not occur: Safety/medical concerns  Wheelchair assist level: Supervision/Verbal cueing Max wheelchair distance: 18ft    Wheelchair 50 feet with 2 turns activity    Assist    Wheelchair 50 feet with 2 turns activity did not occur: Safety/medical concerns   Assist Level: Moderate Assistance - Patient 50 - 74%   Wheelchair 150 feet activity     Assist  Wheelchair 150 feet activity did not occur: Safety/medical concerns       Blood pressure 130/72, pulse 61, temperature 97.8 F (36.6 C), temperature source Oral, resp. rate 18, height 5\' 4"  (1.626 m), weight 72.4 kg, SpO2 97 %.  Medical Problem List and Plan: 1.  Right distal femur fracture with intercondylar extension secondary to fall.  Status post ORIF 12/02/2020.  Nonweightbearing  Continue CIR therapies including PT, OT   Max A 1 standing, will plan for SNF.  2.  Impaired mobility: -DVT/anticoagulation: Vascular study negative.  Pharmaceutical: Continue Lovenox             -antiplatelet therapy: Aspirin 81 mg daily 3. Femur fracture/right knee pain: Scheduled Tylenol 650mg  TID. Discontinue Hydrocodone. Voltaren gel 2mg  QID   Controlled on 9/23 4. Mood: Provide emotional support             -antipsychotic agents: N/A 5. Neuropsych: This patient is capable of making decisions on her own behalf. 6. Stage 2 sacral pressure injury: offload q2H, wound care. Continue vitamin C 1,000mg  daily to promote wound healing.  7. Fluids/Electrolytes/Nutrition: Routine in and outs 8.  Acute blood loss anemia.    Hemoglobin 8.2 on 9/7--> 10.6 9/19 9.  Hyperlipidemia: Lipitor 10.  Systolic BP elevated, diastolic BP soft:  discontinue Norvasc. Monitor with  increased mobility  Relatively controlled on 9/23 11.  Hypothyroidism.  Continue Synthroid 12.  History of CVA with resulting gait abnormality.  Continue aspirin 13. Overweight BMI 26.98: provide counseling.  14. Urine with foul strong odor- patient is alert and oriented and bright with no urinary complaints, afebrile- no longer has odor, no sx.  15. Constipation: continue colace 100mg  BID 16.  Sleep disturbance: restart melatonin 3mg  HS 17.  Hypoalbuminemia: encourage high protein foods 18. Incontinent of urine: continue bowel and bladder program 19: Disposition: d/c to SNF once bed available. HFU scheduled.   LOS: 17 days A FACE TO FACE EVALUATION WAS PERFORMED  10/23 Chaitanya Amedee 12/22/2020, 12:50 PM

## 2020-12-22 NOTE — Progress Notes (Signed)
Physical Therapy Session Note  Patient Details  Name: Andrea [name] MRN: [id number] Date of Birth: [birth year]  Today's Date: 12/22/2020 PT Individual Time: 0800-0855 PT Individual Time Calculation (min): 55 min   Short Term Goals: Week 2:  PT Short Term Goal 1 (Week 2): pt will transfer sit<>supine with min A PT Short Term Goal 1 - Progress (Week 2): Met PT Short Term Goal 2 (Week 2): pt will transfer sit<>stand with LRAD and mod A PT Short Term Goal 2 - Progress (Week 2): Progressing toward goal PT Short Term Goal 3 (Week 2): pt will transfer bed<>chair with LRAD and mod A of 1 PT Short Term Goal 3 - Progress (Week 2): Met Week 3:  PT Short Term Goal 1 (Week 3): pt will transfer bed<>chair with LRAD and min A consistantly PT Short Term Goal 2 (Week 3): pt will perform WC mobility 30f using BUE and supervision PT Short Term Goal 3 (Week 3): pt will transfer sit<>stand with LRAD and mod A of 1  Skilled Therapeutic Interventions/Progress Updates:   Received pt supine in bed asleep, upon wakening pt agreeable to PT treatment, and reported mild R hip pain with mobility but declined any pain medications. Pt also reported difficulty sleeping last night due to discomfort on her tailbone. Session with emphasis on dressing, functional mobility/transfers, generalized strengthening, dynamic standing balance/coordination, and improved activity tolerance. Pt pulled pants down to knees with supervision but required mod A to remove from ankles. Checked to ensure brief was clean - pt found to be incontinent of urine and soiled sacral dressing. Removed dirty brief and dressing and performed peri-care dependently. RN present to apply new sacral dressing and pt rolled L/R with supervision and use of bedrails and donned clean brief and pants with max A. Pt transferred supine<>sitting EOB from flat bed using bedrails with supervision. Doffed dirty shirt and donned clean one with min A and sat EOB and took  medications. Donned L shoe with total A. Worked on blocked practice sit<>stands with RW from elevated EOB x 4 trials with max A. Pt demonstrates improvements in remembering to get her LLE back underneath her prior to standing but continues to require cues for anterior weight shifting. Pt transferred bed<>WC via slideboard to R with CGA overall. Pt required min/mod cues to place board as pt initially placing one end of the board underneath her buttocks and forgetting to put the other [facility] Concluded session with pt sitting in WC, needs within reach, and chair pad alarm on.   Therapy Documentation Precautions:  Precautions Precautions: Fall Restrictions Weight Bearing Restrictions: Yes RLE Weight Bearing: Non weight bearing  Therapy/Group: Individual Therapy AAlfonse AlpersPT, DPT   12/22/2020, 7:18 AM

## 2020-12-22 NOTE — Progress Notes (Signed)
Physical Therapy Session Note  Patient Details  Name: Andrea [name] MRN: [id number] Date of Birth: 1925/10/28  Today's Date: 12/22/2020 PT Individual Time: 1018-1100 PT Individual Time Calculation (min): 42 min   Short Term Goals: Week 1:  PT Short Term Goal 1 (Week 1): pt will transfer supine<>sitting EOB with min A PT Short Term Goal 1 - Progress (Week 1): Met PT Short Term Goal 2 (Week 1): pt will transfer bed<>chair with LRAD and max of 1 PT Short Term Goal 2 - Progress (Week 1): Progressing toward goal PT Short Term Goal 3 (Week 1): pt will transfer sit<>stand with LRAD and max of 1 PT Short Term Goal 3 - Progress (Week 1): Met  Skilled Therapeutic Interventions/Progress Updates:      Therapy Documentation Precautions:  Precautions Precautions: Fall Restrictions Weight Bearing Restrictions: Yes RLE Weight Bearing: Non weight bearing   Pain: denies pain Pt initially oob in wc.     Wc propulsion x 64f w/min assist due to LUE weakness, slow progression. Transported to gym. In parallel bars: Worked on standing tolerance, standing therex.   Uses rails to pull self to standing w/min assist, maintains NWB well. Standing RLE hip flexion, repeated Sit to stand x 2, stood 1 min x 1, 30 sec x 1, poor posture, flexed trunk. sliding board transfer wc to mat, mat to wc level transfer w/set up assist, cga Seated LAQs, ankle pumps, overhead press w/2lb bar, forward press 2lb bar.   At end of session, pt transported back to room.  Handed off to NT in room.   Therapy/Group: Individual Therapy [place]Callie Fielding [place]9/23/2022, 11:42 AM

## 2020-12-22 NOTE — Progress Notes (Signed)
Occupational Therapy Session Note  Patient Details  Name: Andrea [name] MRN: [id number] Date of Birth: [birth year]  Today's Date: 12/22/2020 OT Individual Time: 4782-9562 OT Individual Time Calculation (min): 41 min    Short Term Goals: Week 3:  OT Short Term Goal 1 (Week 3): Pt will complete toilet transfer with max assist of one caregiver OT Short Term Goal 2 (Week 3): Pt will complete toileting with max assist OT Short Term Goal 3 (Week 3): Pt will complete LB dressing with mod assist at sit > stand level  Skilled Therapeutic Interventions/Progress Updates:    Treatment session with focus on functional transfers, sit > stand, and anterior weight shifts as needed to participate in functional mobility.  Pt received upright in w/c reporting pain in tailbone due to prolonged sitting.  Pt transported to therapy gym and completed slide board transfer to R with min assist and total assist for therapist placement of slide board.  Attempted sit > stand from mat with RW, however pt unable to stand fully despite max-total assist.  Engaged in anterior weight shifting with reaching for target and stacking cones on top to facilitate increased weight shifting as needed for functional mobility.  Engaged in sit > stand x2 after anterior reaching activity with pt able to complete with mod assist.  Completed slide board transfer to L with min assist to facilitate anterior weight shift.  Pt remained seated upright in w/c with chair alarm on and all needs in reach.  Therapy Documentation Precautions:  Precautions Precautions: Fall Restrictions Weight Bearing Restrictions: Yes RLE Weight Bearing: Non weight bearing  Pain:  Pt with c/o pain in tailbone, premedicated.  Repositioned.   Therapy/Group: Individual Therapy  Rosalio Loud 12/22/2020, 1:58 PM

## 2020-12-22 NOTE — Progress Notes (Signed)
Occupational Therapy Session Note  Patient Details  Name: Andrea [name] MRN: [id number] Date of Birth: [birth year]  Today's Date: 12/22/2020 OT Group Time: 1500-1600 OT Group Time Calculation (min): 60 min   Short Term Goals: Week 1:  OT Short Term Goal 1 (Week 1): Pt will complete bathing with mod assist OT Short Term Goal 1 - Progress (Week 1): Met OT Short Term Goal 2 (Week 1): Pt will complete LB dressing with max assist and AE as needed OT Short Term Goal 2 - Progress (Week 1): Met OT Short Term Goal 3 (Week 1): Pt will complete toilet transfer with max assist of one caregiver OT Short Term Goal 3 - Progress (Week 1): Progressing toward goal OT Short Term Goal 4 (Week 1): Pt will complete toileting with max assist OT Short Term Goal 4 - Progress (Week 1): Progressing toward goal Week 2:  OT Short Term Goal 1 (Week 2): Pt will complete toilet transfer with max assist of one caregiver OT Short Term Goal 1 - Progress (Week 2): Partly met OT Short Term Goal 2 (Week 2): Pt will complete toileting with max assist OT Short Term Goal 2 - Progress (Week 2): Progressing toward goal OT Short Term Goal 3 (Week 2): Pt will complete LB dressing with mod assist with AE as needed OT Short Term Goal 3 - Progress (Week 2): Met OT Short Term Goal 4 (Week 2): Pt will complete bathing with min assist with AE as needed OT Short Term Goal 4 - Progress (Week 2): Met  Skilled Therapeutic Interventions/Progress Updates:    Pt participated in rhythmic drumming group. No pain reported. Focus of group on BUE coordination, strengthening, endurance, timing/control, activity tolerance, and social participation and engagement. Pt performs session from seated position for energy conservation. Skilled interventions included slowing down rhythmic movements to increase coordination to beat of music. Warm up performed prior to exercises and UB stretching completed at end of group with demo from OT. Pt able to select  preferred song to share with group. Returned pt to room at end of session. Exited session with pt seated in wc, exit alarm on and call light in reach   Therapy Documentation Precautions:  Precautions Precautions: Fall Restrictions Weight Bearing Restrictions: Yes RLE Weight Bearing: Non weight bearing      Therapy/Group: Group Therapy  Mikaela Hilgeman 12/22/2020, 6:57 AM

## 2020-12-23 MED ORDER — COLLAGENASE 250 UNIT/GM EX OINT
TOPICAL_OINTMENT | Freq: Every day | CUTANEOUS | Status: DC
  Filled 2020-12-23: qty 30

## 2020-12-24 MED ORDER — POLYETHYLENE GLYCOL 3350 17 G PO PACK
17.0000 g | PACK | Freq: Every day | ORAL | Status: DC | PRN
  Administered 2020-12-27: 17 g via ORAL
  Filled 2020-12-24: qty 1

## 2020-12-24 NOTE — Progress Notes (Signed)
Physical Therapy Session Note  Patient Details  Name: Andrea [name] MRN: [id number] Date of Birth: [birth year]  Today's Date: 12/24/2020 PT Individual Time: 1300-1400 PT Individual Time Calculation (min): 60 min   Short Term Goals: Week 3:  PT Short Term Goal 1 (Week 3): pt will transfer bed<>chair with LRAD and min A consistantly PT Short Term Goal 2 (Week 3): pt will perform WC mobility 64ft using BUE and supervision PT Short Term Goal 3 (Week 3): pt will transfer sit<>stand with LRAD and mod A of 1  Skilled Therapeutic Interventions/Progress Updates:     Patient in bed upon PT arrival. Patient alert and agreeable to PT session. Patient reported 4/10 tailbone pain during session, RN made aware. PT provided repositioning, rest breaks, and distraction as pain interventions throughout session.   Therapeutic Activity: Bed Mobility: Patient performed supine to sit with supervision in a flat bed with min use of bed rails.  Transfers: Patient performed a slide board transfer bed>w/c with min A and total A for board placement. Provided cues for hand placement, board placement, and head-hips relationship for increased hip clearance and decreased assist with transfers. Patient maintained NWB throughout transfer.  Wheelchair Mobility:  Patient propelled wheelchair 55 feet with supervision with very slow propulsion requiring significant time for mobility. Provided verbal cues for increased stroke length. Discussed home set-up and w/c access, patient reports ramped entry and doors wide enough for a w/c. Discussed benefits of utralight w/c versus power w/c for energy conservation and improved independence with mobility. Patient open to these options. Educated on w/c recommendations coming from next level of care and encouraged discussion of these options over a standard w/c. Patient in agreement.   Therapeutic Exercise: Patient performed the following exercises 2 sets 10 reps with verbal and tactile cues  for proper technique. -B hip/knee flexion in supine with hold at end range -B hip abd/add -B SLR -B quad sets with 5 sec hold  Patient in w/c in the room at end of session with breaks locked, chair alarm set, and all needs within reach. BP 140/68, HR 72.   Therapy Documentation Precautions:  Precautions Precautions: Fall Restrictions Weight Bearing Restrictions: Yes RLE Weight Bearing: Non weight bearing    Therapy/Group: Individual Therapy  Ancil Dewan L Aleynah Rocchio PT, DPT  12/24/2020, 4:14 PM

## 2020-12-25 MED ORDER — ACETAMINOPHEN 325 MG PO TABS
650.0000 mg | ORAL_TABLET | Freq: Two times a day (BID) | ORAL | Status: DC
  Administered 2020-12-25 – 2020-12-26 (×2): 650 mg via ORAL
  Filled 2020-12-25 (×2): qty 2

## 2020-12-25 NOTE — Plan of Care (Signed)
  Problem: RH Simple Meal Prep Goal: LTG Patient will perform simple meal prep w/assist (OT) Description: LTG: Patient will perform simple meal prep with assistance, with/without cues (OT). Outcome: Not Applicable Flowsheets (Taken 12/25/2020 1154) LTG: Pt will perform simple meal prep with assistance level of: (D/C as change of d/c to SNF) -- Note: D/c as not a focus at this time and no longer applicable as chance of d/c to SNF   Problem: RH Dressing Goal: LTG Patient will perform lower body dressing w/assist (OT) Description: LTG: Patient will perform lower body dressing with assist, with/without cues in positioning using equipment (OT) Flowsheets (Taken 12/25/2020 1154) LTG: Pt will perform lower body dressing with assistance level of: (downgraded) Moderate Assistance - Patient 50 - 74% Note: Downgraded due to decreased postural control and difficulty with use of AE due to decreased recall of new information   Problem: RH Toileting Goal: LTG Patient will perform toileting task (3/3 steps) with assistance level (OT) Description: LTG: Patient will perform toileting task (3/3 steps) with assistance level (OT)  Flowsheets (Taken 12/25/2020 1154) LTG: Pt will perform toileting task (3/3 steps) with assistance level: (downgraded) Maximal Assistance - Patient 25 - 49% Note: Downgraded due to decreased awareness of toileting needs, but still focus on clothing management

## 2020-12-25 NOTE — Progress Notes (Signed)
Occupational Therapy Session Note  Patient Details  Name: Andrea [name] MRN: [id number] Date of Birth: [birth year]  Today's Date: 12/25/2020 OT Individual Time: 1660-6301 OT Individual Time Calculation (min): 60 min    Short Term Goals: Week 3:  OT Short Term Goal 1 (Week 3): Pt will complete toilet transfer with max assist of one caregiver OT Short Term Goal 2 (Week 3): Pt will complete toileting with max assist OT Short Term Goal 3 (Week 3): Pt will complete LB dressing with mod assist at sit > stand level  Skilled Therapeutic Interventions/Progress Updates:  Skilled OT intervention with focus on ADL retraining today. Pt received semi-supine in bed, agreeable to therapy session. Due to incontinent episode, completed perineal bathing at bed level with mod A and pt stepping in as much as possible to clean and donn brief. Supine > EOB with mod A. Pt then completed UB bathing/dressing with set up assist, LB bathing/dressing with mod-max A. Pt was able to thread L foot after the hole of the pant leg was set up, however needed assistance with threading the R foot. Pt can generally donn pant legs to hips using weight shifting and leaning techniques, but requires mod-max assist for sit > stand and is unable to pull waistband of pants up due to inability to let go from walker.Therapist reminded pt and offered pt the use of reacher for LB dressing, however pt never initiated. Pt then slide board transferred from bed > w/c requiring min A for leaning forward and cues for effective hand placements during transfer. Pt completed grooming tasks with set up A at sink while seated in w/c. Pt left seated in w/c with chair alarm activated and all needs in reach at departure.  Therapy Documentation Precautions:  Precautions Precautions: Fall Restrictions Weight Bearing Restrictions: Yes RLE Weight Bearing: Non weight bearing  Pain: No complaints of pain during session.  Therapy/Group: Individual  Therapy  [name] 12/25/2020, 11:54 AM

## 2020-12-25 NOTE — Progress Notes (Signed)
Occupational Therapy Session Note  Patient Details  Name: Andrea [name] MRN: [id number] Date of Birth: 1925/04/04  Today's Date: 12/25/2020 OT Individual Time: 9381-0175 OT Individual Time Calculation (min): 40 min    Short Term Goals: Week 3:  OT Short Term Goal 1 (Week 3): Pt will complete toilet transfer with max assist of one caregiver OT Short Term Goal 2 (Week 3): Pt will complete toileting with max assist OT Short Term Goal 3 (Week 3): Pt will complete LB dressing with mod assist at sit > stand level  Skilled Therapeutic Interventions/Progress Updates:    Patient seated in w/c, alert and denies pain.   She states that she does not have any adl or toileting needs at this time.  Completed sliding board transfer w/c to bed, moderate cues to set up w/c and board placement, min A for transfer with min cues to maintain NWB on right leg.  Able to tolerate unsupported sitting for 30 minutes with focus on trunk mobility, arm and leg conditioning exercises.  Sit to supine with min A for right leg management.  She notes that she is more tired than she realized.  She remained in bed at close of session, bed alarm set and call bell in reach.    Therapy Documentation Precautions:  Precautions Precautions: Fall Restrictions Weight Bearing Restrictions: Yes RLE Weight Bearing: Non weight bearing  Therapy/Group: Individual Therapy  Barrie Lyme 12/25/2020, 7:48 AM

## 2020-12-25 NOTE — Progress Notes (Signed)
Physical Therapy Session Note  Patient Details  Name: Andrea [name] MRN: [id number] Date of Birth: [birth year]  Today's Date: 12/25/2020 PT Individual Time: 1002-1057 and 1345-1425 PT Individual Time Calculation (min): 55 min and 40 min  Short Term Goals: Week 2:  PT Short Term Goal 1 (Week 2): pt will transfer sit<>supine with min A PT Short Term Goal 1 - Progress (Week 2): Met PT Short Term Goal 2 (Week 2): pt will transfer sit<>stand with LRAD and mod A PT Short Term Goal 2 - Progress (Week 2): Progressing toward goal PT Short Term Goal 3 (Week 2): pt will transfer bed<>chair with LRAD and mod A of 1 PT Short Term Goal 3 - Progress (Week 2): Met Week 3:  PT Short Term Goal 1 (Week 3): pt will transfer bed<>chair with LRAD and min A consistantly PT Short Term Goal 2 (Week 3): pt will perform WC mobility 76f using BUE and supervision PT Short Term Goal 3 (Week 3): pt will transfer sit<>stand with LRAD and mod A of 1  Skilled Therapeutic Interventions/Progress Updates:   Treatment Session 1 Received pt sitting in WC, pt agreeable to PT treatment, and denied any pain during session. Session with emphasis on functional mobility/transfers, generalized strengthening, simulated car transfers, dynamic sitting balance/coordination, and improved activity tolerance. Donned L shoe with max A and pt transported to/from room on 5C in WC total A for time management purposes. Pt performed simulated car transfer using slideboard and CGA for actual transfer in/out of car with mod multimodal cues for sequencing but min A to manage RLE due to increased pain in R knee and decreased R knee flexion ROM. Pt required total A to place board and increased time and cues to scoot hips towards drivers seat to allow more room to get RLE in - overall pt demonstrated occasional RLE TDWB rather than NWB when transferring. Pt transported to dayroom and transferred on/off Nustep via slideboard with CGA and cues for LLE foot  placement, hand placement on board, and anterior weight shifting. Pt performed BUE and LLE strengthening on Nustep at workload 3 for 10 minutes for a total of 269 steps for improved cardiovascular endurance. Pt required min A to position LLE on footplate and mod A to start machine. Pt then requested to comb hair; able to do so with set up assist and therapist pulled hair back with total A per pt request. Reviewed how to call for nursing on call bell - required min cues overall. Concluded session with pt sitting in WC, needs within reach, and chair pad alarm on.   Treatment Session 2 Received pt sitting in WC, pt agreeable to PT treatment, and reported mild pain in tailbone but declined any pain medications. Session with emphasis on functional mobility/transfers, generalized strengthening, dynamic standing balance/coordination, WC mobility, and improved activity tolerance. Checked to ensure brief was clean with assist of NT. Sit<>stand with RW and max A of 1 x 2 trials - cues to get LL underneath her and for anterior weight shifting. Pt able to maintain static standing balance with mod A of 1 while second person doffed pants, removed soiled brief, performed peri-care and donned clean brief. Pt required seated rest break prior to standing again to pull pants over hips. Pt demonstrated good adherence to RLE NWB precautions when standing. Pt then performed WC mobility 653fx 1 and 4570f 1 using BUEs and supervision. Pt continues to require increased time with WC mobility but requires less cues overall for sequencing.  Pt transported around [facility] to look outside to uplift mood/spirits and back to room in [facility] total A. Concluded session with pt sitting in WC, needs within reach, and chair pad alarm on awaiting next OT session.   Therapy Documentation Precautions:  Precautions Precautions: Fall Restrictions Weight Bearing Restrictions: Yes RLE Weight Bearing: Non weight bearing  Therapy/Group: Individual  Therapy Alfonse Alpers PT, DPT   12/25/2020, 7:30 AM

## 2020-12-25 NOTE — Progress Notes (Signed)
PROGRESS NOTE   Subjective/Complaints: Andrea [name] has no questions this morning Cleaning her dentures Tossing and turning at night- discussed increasing melatonin and she defers at this time  ROS: Patient denies fever, rash, sore throat, blurred vision, nausea, vomiting, diarrhea, cough, shortness of breath or chest pain, joint or back pain, headache, or mood change, + insomnia, +hip pain- well controlled  Objective:   No results found. No results for input(s): WBC, HGB, HCT, PLT in the last 72 hours.   No results for input(s): NA, K, CL, CO2, GLUCOSE, BUN, CREATININE, CALCIUM in the last 72 hours.   Intake/Output Summary (Last 24 hours) at 12/25/2020 1234 Last data filed at 12/25/2020 3818 Gross per 24 hour  Intake 920 ml  Output --  Net 920 ml      Pressure Injury 12/05/20 Buttocks Left Stage 2 -  Partial thickness loss of dermis presenting as a shallow open injury with a red, pink wound bed without slough. A stage 2 measuring 1cmx1cm and surrounding skin is nonblaching. entire area measures 8.5cmx (Active)  12/05/20 1700  Location: Buttocks  Location Orientation: Left  Staging: Stage 2 -  Partial thickness loss of dermis presenting as a shallow open injury with a red, pink wound bed without slough.  Wound Description (Comments): A stage 2 measuring 1cmx1cm and surrounding skin is nonblaching. entire area measures 8.5cmx3.7cm.  Present on Admission: Yes    Physical Exam: Vital Signs Blood pressure 138/65, pulse 63, temperature 97.7 F (36.5 C), temperature source Oral, resp. rate 20, height 5\' 4"  (1.626 m), weight 72.4 kg, SpO2 95 %. Gen: no distress, normal appearing HEENT: oral mucosa pink and moist, NCAT Cardio: Reg rate Chest: normal effort, normal rate of breathing Abd: soft, non-distended Ext: no edema Psych: pleasant, normal affect   Skin: Warm and dry.  Incision with dressing CDI, sacral pressure  injury Musc: Right thigh with edema and tenderness, l Neuro: Alert Motor: Bilateral upper extremities: 4+/5 proximal distal Right lower extremity: Hip flexion, knee extension 2+/5, ankle dorsiflexion 4+/5  Left lower extremity: Hip flexion, knee extension 3+/5, ankle dorsiflexion 4+/5  Assessment/Plan: 1. Functional deficits which require 3+ hours per day of interdisciplinary therapy in a comprehensive inpatient rehab setting. Physiatrist is providing close team supervision and 24 hour management of active medical problems listed below. Physiatrist and rehab team continue to assess barriers to discharge/monitor patient progress toward functional and medical goals  Care Tool:  Bathing    Body parts bathed by patient: Right arm, Left arm, Chest, Abdomen, Face, Right upper leg, Left upper leg   Body parts bathed by helper: Front perineal area, Buttocks     Bathing assist Assist Level: Minimal Assistance - Patient > 75%     Upper Body Dressing/Undressing Upper body dressing   What is the patient wearing?: Pull over shirt    Upper body assist Assist Level: Set up assist    Lower Body Dressing/Undressing Lower body dressing      What is the patient wearing?: Pants, Incontinence brief     Lower body assist Assist for lower body dressing: Moderate Assistance - Patient 50 - 74%     Toileting Toileting  Toileting assist Assist for toileting: Total Assistance - Patient < 25%     Transfers Chair/bed transfer  Transfers assist     Chair/bed transfer assist level: Contact Guard/Touching assist Chair/bed transfer assistive device: Sliding board   Locomotion Ambulation   Ambulation assist   Ambulation activity did not occur: Safety/medical concerns          Walk 10 feet activity   Assist  Walk 10 feet activity did not occur: Safety/medical concerns (pain, weakness, decreased balance, poor adherance to RLE NWB precautions)        Walk 50 feet  activity   Assist Walk 50 feet with 2 turns activity did not occur: Safety/medical concerns         Walk 150 feet activity   Assist Walk 150 feet activity did not occur: Safety/medical concerns         Walk 10 feet on uneven surface  activity   Assist Walk 10 feet on uneven surfaces activity did not occur: Safety/medical concerns         Wheelchair     Assist Is the patient using a wheelchair?: Yes Type of Wheelchair: Manual Wheelchair activity did not occur: Safety/medical concerns  Wheelchair assist level: Supervision/Verbal cueing Max wheelchair distance: 12ft    Wheelchair 50 feet with 2 turns activity    Assist    Wheelchair 50 feet with 2 turns activity did not occur: Safety/medical concerns   Assist Level: Moderate Assistance - Patient 50 - 74%   Wheelchair 150 feet activity     Assist  Wheelchair 150 feet activity did not occur: Safety/medical concerns       Blood pressure 138/65, pulse 63, temperature 97.7 F (36.5 C), temperature source Oral, resp. rate 20, height 5\' 4"  (1.626 m), weight 72.4 kg, SpO2 95 %.  Medical Problem List and Plan: 1.  Right distal femur fracture with intercondylar extension secondary to fall.  Status post ORIF 12/02/2020.  Nonweightbearing  Continue CIR therapies including PT, OT   Max A 1 standing, will plan for SNF.  2.  Impaired mobility: -DVT/anticoagulation: Vascular study negative.  Pharmaceutical: Continue Lovenox             -antiplatelet therapy: Aspirin 81 mg daily 3. Femur fracture/right knee pain: Decrease Tylenol to 650mg  BID. Discontinue Hydrocodone. Voltaren gel 2mg  QID   Controlled on 9/26 4. Mood: Provide emotional support             -antipsychotic agents: N/A 5. Neuropsych: This patient is capable of making decisions on her own behalf. 6. Stage 2 sacral pressure injury: offload q2H, wound care. Continue vitamin C 1,000mg  daily to promote wound healing.  7. Fluids/Electrolytes/Nutrition:  Routine in and outs 8.  Acute blood loss anemia.    Hemoglobin 8.2 on 9/7--> 10.6 9/19 9.  Hyperlipidemia: Lipitor 10.  Systolic BP elevated, diastolic BP soft:  discontinue Norvasc. Monitor with increased mobility  Relatively controlled on 9/26 11.  Hypothyroidism.  Continue Synthroid 12.  History of CVA with resulting gait abnormality.  Continue aspirin 13. Overweight BMI 26.98: provide counseling.  14. Urine with foul strong odor- patient is alert and oriented and bright with no urinary complaints, afebrile- no longer has odor, no sx.  15. Constipation: continue colace 100mg  BID 16.  Sleep disturbance: restart melatonin 3mg  HS 17.  Hypoalbuminemia: encourage high protein foods 18. Incontinent of urine: continue bowel and bladder program 19: Disposition: d/c to SNF once bed available. HFU scheduled.   LOS: 20 days A FACE TO  FACE EVALUATION WAS PERFORMED  Horton Chin 12/25/2020, 12:34 PM

## 2020-12-26 MED ORDER — AMLODIPINE BESYLATE 2.5 MG PO TABS
2.5000 mg | ORAL_TABLET | Freq: Every day | ORAL | Status: DC
  Administered 2020-12-26 – 2020-12-28 (×3): 2.5 mg via ORAL
  Filled 2020-12-26 (×3): qty 1

## 2020-12-26 MED ORDER — ACETAMINOPHEN 325 MG PO TABS
650.0000 mg | ORAL_TABLET | Freq: Every day | ORAL | Status: DC
  Administered 2020-12-27: 650 mg via ORAL
  Filled 2020-12-26: qty 2

## 2020-12-26 NOTE — Progress Notes (Signed)
PROGRESS NOTE   Subjective/Complaints: No complaints this morning On the phone  ROS: Patient denies fever, rash, sore throat, blurred vision, nausea, vomiting, diarrhea, cough, shortness of breath or chest pain, joint or back pain, headache, or mood change, + insomnia, +hip pain- well controlled  Objective:   No results found. No results for input(s): WBC, HGB, HCT, PLT in the last 72 hours.   No results for input(s): NA, K, CL, CO2, GLUCOSE, BUN, CREATININE, CALCIUM in the last 72 hours.   Intake/Output Summary (Last 24 hours) at 12/26/2020 1131 Last data filed at 12/26/2020 0900 Gross per 24 hour  Intake 860 ml  Output --  Net 860 ml      Pressure Injury 12/05/20 Buttocks Left Stage 2 -  Partial thickness loss of dermis presenting as a shallow open injury with a red, pink wound bed without slough. A stage 2 measuring 1cmx1cm and surrounding skin is nonblaching. entire area measures 8.5cmx (Active)  12/05/20 1700  Location: Buttocks  Location Orientation: Left  Staging: Stage 2 -  Partial thickness loss of dermis presenting as a shallow open injury with a red, pink wound bed without slough.  Wound Description (Comments): A stage 2 measuring 1cmx1cm and surrounding skin is nonblaching. entire area measures 8.5cmx3.7cm.  Present on Admission: Yes    Physical Exam: Vital Signs Blood pressure (!) 158/66, pulse 61, temperature 97.8 F (36.6 C), temperature source Oral, resp. rate 20, height 5\' 4"  (1.626 m), weight 72.4 kg, SpO2 97 %. Gen: no distress, normal appearing HEENT: oral mucosa pink and moist, NCAT Cardio: Reg rate Chest: normal effort, normal rate of breathing Abd: soft, non-distended Ext: no edema Psych: pleasant, normal affect Skin: Warm and dry.  Incision with dressing CDI, sacral pressure injury Musc: Right thigh with edema and tenderness, l Neuro: Alert Motor: Bilateral upper extremities: 4+/5 proximal  distal Right lower extremity: Hip flexion, knee extension 2+/5, ankle dorsiflexion 4+/5  Left lower extremity: Hip flexion, knee extension 3+/5, ankle dorsiflexion 4+/5  Assessment/Plan: 1. Functional deficits which require 3+ hours per day of interdisciplinary therapy in a comprehensive inpatient rehab setting. Physiatrist is providing close team supervision and 24 hour management of active medical problems listed below. Physiatrist and rehab team continue to assess barriers to discharge/monitor patient progress toward functional and medical goals  Care Tool:  Bathing    Body parts bathed by patient: Right arm, Left arm, Chest, Abdomen, Face, Right upper leg, Left upper leg, Right lower leg, Front perineal area   Body parts bathed by helper: Buttocks, Left lower leg     Bathing assist Assist Level: Minimal Assistance - Patient > 75%     Upper Body Dressing/Undressing Upper body dressing   What is the patient wearing?: Pull over shirt    Upper body assist Assist Level: Set up assist    Lower Body Dressing/Undressing Lower body dressing      What is the patient wearing?: Pants, Incontinence brief     Lower body assist Assist for lower body dressing: Moderate Assistance - Patient 50 - 74%     Toileting Toileting    Toileting assist Assist for toileting: Total Assistance - Patient < 25%  Transfers Chair/bed transfer  Transfers assist     Chair/bed transfer assist level: Supervision/Verbal cueing Chair/bed transfer assistive device: Sliding board   Locomotion Ambulation   Ambulation assist   Ambulation activity did not occur: Safety/medical concerns          Walk 10 feet activity   Assist  Walk 10 feet activity did not occur: Safety/medical concerns (pain, weakness, decreased balance, poor adherance to RLE NWB precautions)        Walk 50 feet activity   Assist Walk 50 feet with 2 turns activity did not occur: Safety/medical concerns          Walk 150 feet activity   Assist Walk 150 feet activity did not occur: Safety/medical concerns         Walk 10 feet on uneven surface  activity   Assist Walk 10 feet on uneven surfaces activity did not occur: Safety/medical concerns         Wheelchair     Assist Is the patient using a wheelchair?: Yes Type of Wheelchair: Manual Wheelchair activity did not occur: Safety/medical concerns  Wheelchair assist level: Supervision/Verbal cueing Max wheelchair distance: 52ft    Wheelchair 50 feet with 2 turns activity    Assist    Wheelchair 50 feet with 2 turns activity did not occur: Safety/medical concerns   Assist Level: Supervision/Verbal cueing   Wheelchair 150 feet activity     Assist  Wheelchair 150 feet activity did not occur: Safety/medical concerns       Blood pressure (!) 158/66, pulse 61, temperature 97.8 F (36.6 C), temperature source Oral, resp. rate 20, height 5\' 4"  (1.626 m), weight 72.4 kg, SpO2 97 %.  Medical Problem List and Plan: 1.  Right distal femur fracture with intercondylar extension secondary to fall.  Status post ORIF 12/02/2020.  Nonweightbearing  Continue CIR therapies including PT, OT   Max A 1 standing, will plan for SNF.   -Interdisciplinary Team Conference today   2.  Impaired mobility: -DVT/anticoagulation: Vascular study negative.  Pharmaceutical: Continue Lovenox             -antiplatelet therapy: Aspirin 81 mg daily 3. Femur fracture/right knee pain: Decrease Tylenol to 650mg  daily. Discontinue Hydrocodone. Voltaren gel 2mg  QID   Controlled on 9/27 4. Mood: Provide emotional support             -antipsychotic agents: N/A 5. Neuropsych: This patient is capable of making decisions on her own behalf. 6. Stage 2 sacral pressure injury: offload q2H, wound care. Continue vitamin C 1,000mg  daily to promote wound healing.  7. Fluids/Electrolytes/Nutrition: Routine in and outs 8.  Acute blood loss anemia.    Hemoglobin  8.2 on 9/7--> 10.6 9/19 9.  Hyperlipidemia: Lipitor 10.  Restart Norvasc 2.5mg  daily.  11.  Hypothyroidism.  Continue Synthroid 12.  History of CVA with resulting gait abnormality.  Continue aspirin 13. Overweight BMI 26.98: provide counseling.  14. Urine with foul strong odor- patient is alert and oriented and bright with no urinary complaints, afebrile- no longer has odor, no sx.  15. Constipation: continue colace 100mg  BID 16.  Sleep disturbance: restart melatonin 3mg  HS 17.  Hypoalbuminemia: encourage high protein foods 18. Incontinent of urine: continue bowel and bladder program 19: Disposition: d/c to SNF once bed available. HFU scheduled.   LOS: 21 days A FACE TO FACE EVALUATION WAS PERFORMED  Andrea [name] 12/26/2020, 11:31 AM

## 2020-12-26 NOTE — Patient Care Conference (Signed)
Inpatient RehabilitationTeam Conference and Plan of Care Update Date: 12/26/2020   Time: 13:40 PM    Patient Name: Andrea [name]      Medical Record Number: [id number]  Date of Birth: 06-08-1925 Sex: Female         Room/Bed: 5C06C/5C06C-01 Payor Info: Payor: MEDICARE / Plan: MEDICARE PART A AND B / Product Type: *No Product type* /    Admit Date/Time:  12/05/2020  4:23 PM  Primary Diagnosis:  Femur fracture [facility])  Hospital Problems: Principal Problem:   Femur fracture (HCC) Active Problems:   Pressure injury of skin   Hypoalbuminemia due to protein-calorie malnutrition (HCC)   Acute blood loss anemia   Postoperative pain    Expected Discharge Date: Expected Discharge Date: 12/30/20  Team Members Present: Physician leading conference: Dr. Sula Soda Social Worker Present: Dossie Der, LCSW Nurse Present: Chana Bode, RN PT Present: Raechel Chute, PT OT Present: Rosalio Loud, OT PPS Coordinator present : Fae Pippin, SLP     Current Status/Progress Goal Weekly Team Focus  Bowel/Bladder   incontinent of bowel and bladder  pt be able to move bowels and empty bladder continently  timed toileting, keep skin dry   Swallow/Nutrition/ Hydration             ADL's   Setup UB bathing/dressing, Mod-max assist LB bathing/dressing EOB requiring Max assist sit to stand, Min assist slide board transfers  Min A overall Supervision UB dressing and grooming  ADL retraining, transfers, sit to stand, AE use, adherence to WB precautions, improved activity tolerance   Mobility   bed mobility supervision, slideboard transfers CGA/min A, sit<>stand with RW max A, supervision WC mobility 41ft  min A overall, supervision for bed and WC mobility  functional mobility/transfers, generalized strengthening, dynamic standing balance/coordination, adherance to WB precautions, D/C planning, and improved activity tolerance.   Communication             Safety/Cognition/ Behavioral Observations             Pain   occasional pain on R knee, no pain on surgical site  remain pain free  assess pain q shift and PRN   Skin   surgical incision to R thigh, sacral ulcer with slough  surgical incision without complication; healing of sacral ulcer with current treatment  assess skin Q shift/PRN     Discharge Planning:  Looking for SNF bed for pt, niece has never returned call. Pt agreeable now and understands reasoning   Team Discussion: Functional status fluctuates and poor recall/memory impair ability to go home without assistance. Stage 2 on buttocks healing and incision doing well.  Remains incontinent and unable to get a hold on continence. Patient appears to have plateaued.  Patient on target to meet rehab goals: Requires max cues for set up and sequencing of transfers. Able to complete bed mobility with supervision and slide board transfers with supervision and max cues. Able to propel the wheelchair and complete upper body care with supervision however needs mod - max assist for lower body care. Does better standing for clothes management and hygiene however , still needs max assist to stand at the sink. Min assist goals set for discharge.  *See Care Plan and progress notes for long and short-term goals.   Revisions to Treatment Plan:  Downgraded goals for toileting   Teaching Needs: Safety, medications, transfers, skin care, etc  Current Barriers to Discharge: Home enviroment access/layout, Incontinence, Wound care, and Lack of/limited family support  Possible Resolutions to  Barriers: SNF recommended     Medical Summary Current Status: neurogenic bowel and bladder, hip pain and incision  Barriers to Discharge: Neurogenic Bowel & Bladder;Decreased family/caregiver support;Medical stability;Wound care  Barriers to Discharge Comments: neurogenic bowel and bladder, hip pain and incision Possible Resolutions to Barriers/Weekly Focus: continue bowel and bladder progream, continue to  monitor hip wound daily and wean hydrocodone as tolerated   Continued Need for Acute Rehabilitation Level of Care: The patient requires daily medical management by a physician with specialized training in physical medicine and rehabilitation for the following reasons: Direction of a multidisciplinary physical rehabilitation program to maximize functional independence : Yes Medical management of patient stability for increased activity during participation in an intensive rehabilitation regime.: Yes Analysis of laboratory values and/or radiology reports with any subsequent need for medication adjustment and/or medical intervention. : Yes   I attest that I was present, lead the team conference, and concur with the assessment and plan of the team.   Chana Bode B 12/26/2020, 2:03 PM

## 2020-12-26 NOTE — Progress Notes (Signed)
Physical Therapy Session Note  Patient Details  Name: Andrea [name] MRN: [id number] Date of Birth: 10-Feb-1926  Today's Date: 12/26/2020 PT Individual Time: 1000-1054 and 1302-1327 PT Individual Time Calculation (min): 54 min and 25 min  Short Term Goals: Week 2:  PT Short Term Goal 1 (Week 2): pt will transfer sit<>supine with min A PT Short Term Goal 1 - Progress (Week 2): Met PT Short Term Goal 2 (Week 2): pt will transfer sit<>stand with LRAD and mod A PT Short Term Goal 2 - Progress (Week 2): Progressing toward goal PT Short Term Goal 3 (Week 2): pt will transfer bed<>chair with LRAD and mod A of 1 PT Short Term Goal 3 - Progress (Week 2): Met Week 3:  PT Short Term Goal 1 (Week 3): pt will transfer bed<>chair with LRAD and min A consistantly PT Short Term Goal 2 (Week 3): pt will perform WC mobility 32f using BUE and supervision PT Short Term Goal 3 (Week 3): pt will transfer sit<>stand with LRAD and mod A of 1  Skilled Therapeutic Interventions/Progress Updates:   Treatment Session 1 Received pt supine in bed on the phone, pt agreeable to PT treatment, and denied any pain during session but reported not sleeping well again last night and slightly disoriented asking if therapist was here for therapy and if it was morning; reoriented pt to date, time, and reason for therapist's presence. Session with emphasis on hygiene management, dressing, functional mobility/transfers, generalized strengthening, dynamic standing balance/coordination, and improved endurance. Checked to ensure brief was clean and pt rolled L/R with supervision and use of bedrails. Pt found to be saturated in urine. Removed soiled brief and sacral dressing and RN notified and present to apply clean dressing. Donned clean brief with max A and pants in supine with max A to thread LE's through but pt able to pull pants over hips without assist via rolling. Supine<>sitting EOB from flat bed with supervision and use of bedrails.  Pt reported feeling "drunk" and in "la la land" upon sitting up and required increased time to scoot to EOB. Doffed dirty shirt and donned clean one with supervision. Donned L shoe with max A and worked on blocked practice sit<>stands with RW and max A from elevated EOB x 5 reps - cues for hand placement on RW, to get LLE underneath her, upright posture/gaze, and anterior weight shifting to keep RW wheels on floor as pt with slight posterior lean in standing. While standing, performed 2x10 R hip flexion and 2x10 hip abduction with min A for balance with emphasis on RLE strengthening and ROM. Also performed alternating UE raises from RW x 5 reps bilaterally. Pt able to maintain balance with min A when lifting LUE but required mod/max A for balance to prevent LOB when lifting RUE due to NWB status on RLE and able to maintain balance longer with raising LUE>RUE. Pt transferred bed<>WC to L via slideboard with min A and max cues to place board and close supervision for actual transfer with therapist blocking LLE from sliding forward. Reviewed again how to use call button to call for help and encouraged pt to drink water to stay hydrated. Concluded session with pt sitting in WC, needs within reach, and chair pad alarm on.  Treatment Session 2 Received pt sitting in WC, pt agreeable to PT treatment, and denied any pain during session but reported feeling sleepy from lunch - unable to recall what she had when therapist asked. Session with emphasis on functional mobility, generalized  strengthening, and improved endurance. Pt performed the following exercises sitting in [facility] with supervision and verbal cues for technique with emphasis on LE strengthening and ROM: -hip flexion 2x15 bilaterally -LAQ 2x15 bilaterally  -RLE heel slides on towel with caution not to place weight through RLE - 2x10 with emphasis on R knee flexion ROM (able to achieve ~60 degrees) -hip adduction pillow squeezes 2x15 -small range WC pushups 2x5  with emphasis on pressure relief while maintaining RLE NWB precautions Concluded session with pt sitting in WC, needs within reach, and chair pad alarm on.   Therapy Documentation Precautions:  Precautions Precautions: Fall Restrictions Weight Bearing Restrictions: Yes RLE Weight Bearing: Non weight bearing  Therapy/Group: Individual Therapy Alfonse Alpers PT, DPT   12/26/2020, 7:25 AM

## 2020-12-26 NOTE — Plan of Care (Signed)
  Problem: RH Ambulation Goal: LTG Patient will ambulate in controlled environment (PT) Description: LTG: Patient will ambulate in a controlled environment, # of feet with assistance (PT). Outcome: Not Applicable Flowsheets (Taken 12/26/2020 1346) LTG: Pt will ambulate in controlled environ  assist needed:: (D/C) -- Note: D/C   Problem: RH Wheelchair Mobility Goal: LTG Patient will propel w/c in controlled environment (PT) Description: LTG: Patient will propel wheelchair in controlled environment, # of feet with assist (PT) Flowsheets (Taken 12/26/2020 1346) LTG: Pt will propel w/c in controlled environ  assist needed:: (downgraded due to decreased endurance, prior CVA, and fatigue) Supervision/Verbal cueing LTG: Propel w/c distance in controlled environment: 63ft Note: downgraded due to decreased endurance, prior CVA, and fatigue Goal: LTG Patient will propel w/c in home environment (PT) Description: LTG: Patient will propel wheelchair in home environment, # of feet with assistance (PT). Flowsheets (Taken 12/26/2020 1346) LTG: Pt will propel w/c in home environ  assist needed:: (downgraded due to decreased endurance, prior CVA, and fatigue) Supervision/Verbal cueing LTG: Propel w/c distance in home environment: 26ft Note: downgraded due to decreased endurance, prior CVA, and fatigue

## 2020-12-26 NOTE — Progress Notes (Signed)
Occupational Therapy Session Note  Patient Details  Name: Andrea [name] MRN: [id number] Date of Birth: 1925-10-31  Today's Date: 12/26/2020 OT Individual Time: 1130-1200 and 1400-1500 OT Individual Time Calculation (min): 30 min and 60 min   Short Term Goals: Week 3:  OT Short Term Goal 1 (Week 3): Pt will complete toilet transfer with max assist of one caregiver OT Short Term Goal 2 (Week 3): Pt will complete toileting with max assist OT Short Term Goal 3 (Week 3): Pt will complete LB dressing with mod assist at sit > stand level  Skilled Therapeutic Interventions/Progress Updates:    1) Treatment session with focus on BUE strengthening and endurance.  Pt received upright in w/c agreeable to therapy session.  Pt engaged in theraband exercises with level 1 theraband with min cues for technique.  Therapist provided pt with theraband HEP handout as conducted during session.  Pt remained upright in w/c with all needs in reach.  2) Treatment session with focus on transfers and anterior and lateral weight shifting for transfers.  Pt received upright in w/c reporting fatigue but agreeable to therapy session.  Pt completed slide board transfer to therapy mat with total assist to place slide board and then min assist for anterior weight shift during transfers.  Therapist directed pt in lateral leans and anterior weight shifting with reaching for items on R and L to engage in table top puzzle.  Therapist then increased challenge by lowering items to facilitate increased reach for both transfers and LB dressing.  Pt completed slide board transfer mat > w/c > bed with min assist.  Pt completed bed mobility supervision and left semi-reclined with all needs in reach.  Therapy Documentation Precautions:  Precautions Precautions: Fall Restrictions Weight Bearing Restrictions: Yes RLE Weight Bearing: Non weight bearing Pain: Pt with no c/o pain   Therapy/Group: Individual Therapy  Rosalio Loud 12/26/2020, 12:29 PM

## 2020-12-27 LAB — SARS CORONAVIRUS 2 (TAT 6-24 HRS): SARS Coronavirus 2: NEGATIVE

## 2020-12-27 MED ORDER — ACETAMINOPHEN 325 MG PO TABS: 650.0000 mg | ORAL_TABLET | Freq: Four times a day (QID) | ORAL | Status: DC | PRN

## 2020-12-27 MED ORDER — DOCUSATE SODIUM 100 MG PO CAPS: 100.0000 mg | ORAL_CAPSULE | Freq: Two times a day (BID) | ORAL | 0 refills | Status: AC

## 2020-12-27 MED ORDER — ACETAMINOPHEN 325 MG PO TABS: 650.0000 mg | ORAL_TABLET | Freq: Four times a day (QID) | ORAL | Status: AC | PRN

## 2020-12-27 MED ORDER — VITAMIN D (ERGOCALCIFEROL) 1.25 MG (50000 UNIT) PO CAPS: 50000.0000 [IU] | ORAL_CAPSULE | ORAL | Status: AC

## 2020-12-27 MED ORDER — MELATONIN 3 MG PO TABS: 3.0000 mg | ORAL_TABLET | Freq: Every day | ORAL | 0 refills | Status: AC

## 2020-12-27 MED ORDER — DICLOFENAC SODIUM 1 % EX GEL: 2.0000 g | Freq: Four times a day (QID) | CUTANEOUS | Status: AC

## 2020-12-27 MED ORDER — ATORVASTATIN CALCIUM 40 MG PO TABS: 40.0000 mg | ORAL_TABLET | Freq: Every day | ORAL | 0 refills | Status: AC

## 2020-12-27 MED ORDER — AMLODIPINE BESYLATE 2.5 MG PO TABS: 2.5000 mg | ORAL_TABLET | Freq: Every day | ORAL | Status: AC

## 2020-12-27 MED ORDER — ADULT MULTIVITAMIN W/MINERALS CH: 1.0000 | ORAL_TABLET | Freq: Every day | ORAL | Status: AC

## 2020-12-27 MED ORDER — ASCORBIC ACID 1000 MG PO TABS: 1000.0000 mg | ORAL_TABLET | Freq: Every day | ORAL | Status: AC

## 2020-12-27 MED ORDER — HYDROCODONE-ACETAMINOPHEN 5-325 MG PO TABS: 1.0000 | ORAL_TABLET | Freq: Every day | ORAL | 0 refills | Status: AC | PRN

## 2020-12-27 MED ORDER — FAMOTIDINE 20 MG PO TABS: 20.0000 mg | ORAL_TABLET | Freq: Every day | ORAL | Status: AC

## 2020-12-27 NOTE — Progress Notes (Signed)
Physical Therapy Session Note  Patient Details  Name: Andrea [name] MRN: [id number] Date of Birth: 01-15-1926  Today's Date: 12/27/2020 PT Individual Time: 1937-9024 PT Individual Time Calculation (min): 26 min   Short Term Goals: Week 1:  PT Short Term Goal 1 (Week 1): pt will transfer supine<>sitting EOB with min A PT Short Term Goal 1 - Progress (Week 1): Met PT Short Term Goal 2 (Week 1): pt will transfer bed<>chair with LRAD and max of 1 PT Short Term Goal 2 - Progress (Week 1): Progressing toward goal PT Short Term Goal 3 (Week 1): pt will transfer sit<>stand with LRAD and max of 1 PT Short Term Goal 3 - Progress (Week 1): Met Week 2:  PT Short Term Goal 1 (Week 2): pt will transfer sit<>supine with min A PT Short Term Goal 1 - Progress (Week 2): Met PT Short Term Goal 2 (Week 2): pt will transfer sit<>stand with LRAD and mod A PT Short Term Goal 2 - Progress (Week 2): Progressing toward goal PT Short Term Goal 3 (Week 2): pt will transfer bed<>chair with LRAD and mod A of 1 PT Short Term Goal 3 - Progress (Week 2): Met Week 3:  PT Short Term Goal 1 (Week 3): pt will transfer bed<>chair with LRAD and min A consistantly PT Short Term Goal 2 (Week 3): pt will perform WC mobility 61f using BUE and supervision PT Short Term Goal 3 (Week 3): pt will transfer sit<>stand with LRAD and mod A of 1  Skilled Therapeutic Interventions/Progress Updates:  Pt received seated in WC in room, denied pain and was agreeable to PT. Majority of session spent answering pt questions regarding DC plans, as pt could not remember name of CSW, where she was going after DC, or when she was leaving CIR. Instructed pt to write down name of CSW and any questions therapist was unable to answer so that pt could remember to ask. Instructed pt to teach back steps to complete lateral board transfer, pt unable to recall more than 25% of the process. Educated pt on removing armrests and locking WC brakes, pt able to teach  back. Performed LAQ and seated marches, 2x20 per side, while pt speaking about [place] for dual-tasking and LE strengthening. Pt was left seated in WC in room, all needs in reach.   Therapy Documentation Precautions:  Precautions Precautions: Fall Restrictions Weight Bearing Restrictions: Yes RLE Weight Bearing: Non weight bearing   Therapy/Group: Individual Therapy JCruzita LedererPlaster, PT, DPT  12/27/2020, 8:58 AM

## 2020-12-27 NOTE — Progress Notes (Signed)
Physical Therapy Session Note  Patient Details  Name: Andrea [name] MRN: [id number] Date of Birth: 21-Sep-1925  Today's Date: 12/27/2020 PT Individual Time: 1000-1038 PT Individual Time Calculation (min): 38 min   Short Term Goals: Week 2:  PT Short Term Goal 1 (Week 2): pt will transfer sit<>supine with min A PT Short Term Goal 1 - Progress (Week 2): Met PT Short Term Goal 2 (Week 2): pt will transfer sit<>stand with LRAD and mod A PT Short Term Goal 2 - Progress (Week 2): Progressing toward goal PT Short Term Goal 3 (Week 2): pt will transfer bed<>chair with LRAD and mod A of 1 PT Short Term Goal 3 - Progress (Week 2): Met Week 3:  PT Short Term Goal 1 (Week 3): pt will transfer bed<>chair with LRAD and min A consistantly PT Short Term Goal 2 (Week 3): pt will perform WC mobility 92f using BUE and supervision PT Short Term Goal 3 (Week 3): pt will transfer sit<>stand with LRAD and mod A of 1  Skilled Therapeutic Interventions/Progress Updates:   Received pt sitting in WC, pt agreeable to PT treatment, and denied any pain during session. Session with emphasis on functional mobility/transfers, generalized strengthening, dynamic standing balance/coordination, NMR, and improved activity tolerance. [facility]. Pt transferred WC<>mat via slideboard to R with supervision but total A to place board. Pt able to slide over without cues but therapist blocked LLE from sliding forwards. Sit<>stand from elevated mat with RW and max A x 4 trials -cues for anterior weight shifting when standing. Worked on dynamic standing balance and functional use of LUE clipping/unclipping 5 clothespins to clothesline using LUE and min A overall for balance. Pt with difficulty grasping clips and with moderate tremors. Mat<>WC via slideboard in same manner to L side. Concluded session with pt sitting in WC, needs within reach, and chair pad alarm on.   Therapy  Documentation Precautions:  Precautions Precautions: Fall Restrictions Weight Bearing Restrictions: Yes RLE Weight Bearing: Non weight bearing  Therapy/Group: Individual Therapy AAlfonse AlpersPT, DPT   12/27/2020, 7:18 AM

## 2020-12-27 NOTE — Progress Notes (Signed)
PROGRESS NOTE   Subjective/Complaints: No complaints this morning.  Positive attitude, bright Discussed plan of d/c to SNF Tolerated OT today  ROS: Patient denies fever, rash, sore throat, blurred vision, nausea, vomiting, diarrhea, cough, shortness of breath or chest pain, joint or back pain, headache, or mood change, + insomnia, +hip pain- well controlled  Objective:   No results found. No results for input(s): WBC, HGB, HCT, PLT in the last 72 hours.   No results for input(s): NA, K, CL, CO2, GLUCOSE, BUN, CREATININE, CALCIUM in the last 72 hours.   Intake/Output Summary (Last 24 hours) at 12/27/2020 0958 Last data filed at 12/27/2020 0700 Gross per 24 hour  Intake 425 ml  Output --  Net 425 ml      Pressure Injury 12/05/20 Buttocks Left Stage 2 -  Partial thickness loss of dermis presenting as a shallow open injury with a red, pink wound bed without slough. A stage 2 measuring 1cmx1cm and surrounding skin is nonblaching. entire area measures 8.5cmx (Active)  12/05/20 1700  Location: Buttocks  Location Orientation: Left  Staging: Stage 2 -  Partial thickness loss of dermis presenting as a shallow open injury with a red, pink wound bed without slough.  Wound Description (Comments): A stage 2 measuring 1cmx1cm and surrounding skin is nonblaching. entire area measures 8.5cmx3.7cm.  Present on Admission: Yes    Physical Exam: Vital Signs Blood pressure (!) 135/50, pulse 64, temperature 97.6 F (36.4 C), temperature source Oral, resp. rate 18, height 5\' 4"  (1.626 m), weight 72.4 kg, SpO2 95 %. Gen: no distress, normal appearing HEENT: oral mucosa pink and moist, NCAT Cardio: Reg rate Chest: normal effort, normal rate of breathing Abd: soft, non-distended Ext: no edema Psych: pleasant, normal affect  Skin: Warm and dry.  Incision with dressing CDI, sacral pressure injury Musc: Right thigh with edema and tenderness,  l Neuro: Alert Motor: Bilateral upper extremities: 4+/5 proximal distal Right lower extremity: Hip flexion, knee extension 2+/5, ankle dorsiflexion 4+/5  Left lower extremity: Hip flexion, knee extension 3+/5, ankle dorsiflexion 4+/5  Assessment/Plan: 1. Functional deficits which require 3+ hours per day of interdisciplinary therapy in a comprehensive inpatient rehab setting. Physiatrist is providing close team supervision and 24 hour management of active medical problems listed below. Physiatrist and rehab team continue to assess barriers to discharge/monitor patient progress toward functional and medical goals  Care Tool:  Bathing    Body parts bathed by patient: Right arm, Left arm, Chest, Abdomen, Face, Right upper leg, Left upper leg, Front perineal area   Body parts bathed by helper: Right lower leg, Left lower leg, Buttocks     Bathing assist Assist Level: Minimal Assistance - Patient > 75%     Upper Body Dressing/Undressing Upper body dressing   What is the patient wearing?: Pull over shirt    Upper body assist Assist Level: Set up assist    Lower Body Dressing/Undressing Lower body dressing      What is the patient wearing?: Pants, Incontinence brief     Lower body assist Assist for lower body dressing: 2 Helpers     Toileting Toileting    Toileting assist Assist for toileting: Total  Assistance - Patient < 25%     Transfers Chair/bed transfer  Transfers assist     Chair/bed transfer assist level: Moderate Assistance - Patient 50 - 74% Chair/bed transfer assistive device: Sliding board   Locomotion Ambulation   Ambulation assist   Ambulation activity did not occur: Safety/medical concerns          Walk 10 feet activity   Assist  Walk 10 feet activity did not occur: Safety/medical concerns (pain, weakness, decreased balance, poor adherance to RLE NWB precautions)        Walk 50 feet activity   Assist Walk 50 feet with 2 turns  activity did not occur: Safety/medical concerns         Walk 150 feet activity   Assist Walk 150 feet activity did not occur: Safety/medical concerns         Walk 10 feet on uneven surface  activity   Assist Walk 10 feet on uneven surfaces activity did not occur: Safety/medical concerns         Wheelchair     Assist Is the patient using a wheelchair?: Yes Type of Wheelchair: Manual Wheelchair activity did not occur: Safety/medical concerns  Wheelchair assist level: Supervision/Verbal cueing Max wheelchair distance: 79ft    Wheelchair 50 feet with 2 turns activity    Assist    Wheelchair 50 feet with 2 turns activity did not occur: Safety/medical concerns   Assist Level: Supervision/Verbal cueing   Wheelchair 150 feet activity     Assist  Wheelchair 150 feet activity did not occur: Safety/medical concerns       Blood pressure (!) 135/50, pulse 64, temperature 97.6 F (36.4 C), temperature source Oral, resp. rate 18, height 5\' 4"  (1.626 m), weight 72.4 kg, SpO2 95 %.  Medical Problem List and Plan: 1.  Right distal femur fracture with intercondylar extension secondary to fall.  Status post ORIF 12/02/2020.  Nonweightbearing  Continue CIR therapies including PT, OT   Max A 1 standing, will plan for SNF.  2.  Impaired mobility: -DVT/anticoagulation: Vascular study negative.  Pharmaceutical: Continue Lovenox             -antiplatelet therapy: Aspirin 81 mg daily 3. Femur fracture/right knee pain: Change Tylenol to PRN. Discontinue Hydrocodone. Voltaren gel 2mg  QID   Controlled on 9/28 4. Mood: Provide emotional support             -antipsychotic agents: N/A 5. Neuropsych: This patient is capable of making decisions on her own behalf. 6. Stage 2 sacral pressure injury: offload q2H, wound care. Continue vitamin C 1,000mg  daily to promote wound healing.  7. Fluids/Electrolytes/Nutrition: Routine in and outs 8.  Acute blood loss anemia.    Hemoglobin  8.2 on 9/7--> 10.6 9/19 9.  Hyperlipidemia: Lipitor 10.  Restart Norvasc 2.5mg  daily- continue current dose 11.  Hypothyroidism.  Continue Synthroid 12.  History of CVA with resulting gait abnormality.  Continue aspirin 13. Overweight BMI 26.98: provide counseling.  14. Urine with foul strong odor- patient is alert and oriented and bright with no urinary complaints, afebrile- no longer has odor, no sx.  15. Constipation: continue colace 100mg  BID 16.  Sleep disturbance: restart melatonin 3mg  HS 17.  Hypoalbuminemia: encourage high protein foods 18. Incontinent of urine: continue bowel and bladder program 19: Disposition: d/c to SNF once bed available. HFU scheduled.   LOS: 22 days A FACE TO FACE EVALUATION WAS PERFORMED  10/28 Rachelanne Whidby 12/27/2020, 9:58 AM

## 2020-12-27 NOTE — Progress Notes (Addendum)
Patient ID: Andrea [name], female   DOB: [birth year], 87 y.o.   MRN: [id number]  Met with pt to discuss team conference progress this week and the need to go to a SNF until she is ambulating with a walker more independently. She is in agreement and has accepted the bed offer [facility] closer to her home. Have left a message for Debbie-Adm at [facility] await return call regarding when available bed.  2:42 PM Spoke with [facility] pt has decided to accept the bed offer and will go into a private room. Dan-PA aware and will order COVID test and do priority DC summary. Did speak with niece-Najmo who would like for her to go closer to [facility] and pt has refused this. Will let niece know of the plan. Tried to discuss with pt being closer to family is good so they can visit but pt feels can call and talk on the phone. She wants to be closer to her home which [place] area

## 2020-12-27 NOTE — Progress Notes (Signed)
Physical Therapy Discharge Summary  Patient Details  Name: Andrea [name] MRN: [id number] Date of Birth: [birth year]  Today's Date: 12/28/2020 PT Individual Time: 1017-1033 PT Individual Time Calculation (min): 16 min   Today's Date: 12/28/2020 PT Missed Time: 80 Minutes Missed Time Reason: Other (Comment) (D/C to SNF)  Patient has met 3 of 8 long term goals due to improved activity tolerance, improved balance, improved postural control, increased strength, and improved coordination. Patient to discharge at a wheelchair level Supervision. Patient's care partner unavailable to provide the necessary physical and cognitive assistance at discharge. Pt continues to require mod multimodal cues for sequencing and problem solving with all functional mobility. Pt is primarily transferring via slideboard and requires cues to get LLE underneath her, for hand placement on board, anterior weight shifting, and scooting technique. However, pt does demonstrate good adherence to RLE NWB precautions with slideboard transfers.   Reasons goals not met: Pt did not meet standing goals of min A as pt currently requires max A to stand and mod A for dynamic standing balance due to weakness, fatigue, deconditioning, and altered balance strategies due to RLE NWB precautions. Pt did not meet car transfer goal of CGA as pt currently requires min A due to difficulty sequencing/motor planning. Pt did not meet WC mobility goal of 60f with supervision as pt is only able to propel up to 652fwith supervision due to generalized weakness and UE fatigue.   Recommendation:  Patient will benefit from ongoing skilled PT services in skilled nursing facility setting to continue to advance safe functional mobility, address ongoing impairments in transfers, generalized strengthening, dynamic standing balance/coordination, endurance, and to minimize fall risk.  Equipment: No equipment provided due to SNF placement   Reasons for discharge:  treatment goals met and discharge from hospital  Patient/family agrees with progress made and goals achieved: Yes  Today's Interventions: Received pt supine in bed with NT assist pt with bathing/dressing to prepare to discharge to SNF at any minute. Therapist provided pt with clean scrub set/socks while NT finished washing pt. Pt transferred semi-reclined<>long sitting in bed with supervision and doffed scrub top and donned clean one with light min A. Pt able to pull pants down but required max A to remove from ankles. Donned clean pants in supine with mod A to thread LEs through but pt able to roll L/R with supervision to pull pants over hips without assist. Concluded session with pt semi-reclined in bed with NT assisting pt with brushing teeth in preparation to leave. 44 minutes missed of skilled physical therapy due to D/C to SNF.   PT Discharge Precautions/Restrictions Precautions Precautions: Fall Restrictions Weight Bearing Restrictions: Yes RLE Weight Bearing: Non weight bearing Pain Interference Pain Interference Pain Effect on Sleep: 8. Unable to answer (moved up D/C date due to available SNF bed) Pain Interference with Therapy Activities: 8. Unable to answer (moved up D/C date due to available SNF bed) Pain Interference with Day-to-Day Activities: 8. Unable to answer (moved up D/C date due to available SNF bed) Cognition Overall Cognitive Status: Impaired/Different from baseline Arousal/Alertness: Awake/alert Orientation Level: Oriented to person;Oriented to place;Oriented to situation Memory: Impaired Awareness: Impaired Problem Solving: Impaired Safety/Judgment: Impaired Sensation Sensation Light Touch: Impaired by gross assessment Proprioception: Impaired by gross assessment Additional Comments: decreased awareness of placement of BLE during sit<>stand transfers Coordination Gross Motor Movements are Fluid and Coordinated: No Fine Motor Movements are Fluid and  Coordinated: No Coordination and Movement Description: grossly uncoordinated due to L hemiparesis from prior  CVA, generalized weakness/deconditioning, decreased balance/postural control, cognitive impairments, and altered balance strategies due to RLE NWB status. Finger Nose Finger Test: WFL on RUE, slower with dysmetria on LUE due to previous CVA Motor  Motor Motor: Abnormal postural alignment and control;Hemiplegia Motor - Skilled Clinical Observations: grossly uncoordinated due to L hemiparesis from prior CVA, generalized weakness/deconditioning, decreased balance/postural control, cognitive impairments, and altered balance strategies due to RLE NWB status.  Mobility Bed Mobility Bed Mobility: Rolling Right;Rolling Left;Supine to Sit;Sit to Supine Rolling Right: Supervision/verbal cueing Rolling Left: Supervision/Verbal cueing Supine to Sit: Supervision/Verbal cueing Sit to Supine: Supervision/Verbal cueing Transfers Transfers: Sit to Stand;Stand to Sit;Lateral/Scoot Transfers Sit to Stand: Maximal Assistance - Patient 25-49% Stand to Sit: Minimal Assistance - Patient > 75% Lateral/Scoot Transfers: Supervision/Verbal cueing (slideboard) Transfer (Assistive device): Other (Comment) (RW for standing, slideboard for transfers) Locomotion  Gait Ambulation: No Gait Gait: No Stairs / Additional Locomotion Stairs: No Wheelchair Mobility Wheelchair Mobility: Yes Wheelchair Assistance: Chartered loss adjuster: Both upper extremities Wheelchair Parts Management: Needs assistance Distance: 86f  Trunk/Postural Assessment  Cervical Assessment Cervical Assessment: Ex[facility] to [facility] Thoracic Assessment Thoracic Assessment: Ex[facility] to [facility](ky[facility]osis) Lumbar Assessment Lumbar Assessment: Exceptions to [facility](posterior pelvic tilt) Postural Control Postural Control: Deficits on evaluation  Balance Balance Balance Assessed: Yes Static Sitting  Balance Static Sitting - Balance Support: Feet unsupported;Bilateral upper extremity supported Static Sitting - Level of Assistance: 5: Stand by assistance (supervision) Dynamic Sitting Balance Dynamic Sitting - Balance Support: Feet supported;No upper extremity supported;During functional activity Dynamic Sitting - Level of Assistance: 5: Stand by assistance (supervision/CGA) Static Standing Balance Static Standing - Balance Support: Bilateral upper extremity supported (RW) Static Standing - Level of Assistance: 4: Min assist Dynamic Standing Balance Dynamic Standing - Balance Support: Bilateral upper extremity supported (RW) Dynamic Standing - Level of Assistance: 3: Mod assist Extremity Assessment  RLE Assessment RLE Assessment: Exceptions to [facility] Strength Comments: not formally tested due to moving up D/C for available SNF bed; grossly 3+/5 LLE Assessment LLE Assessment: Exceptions to [facility] Strength Comments: not formally tested due to moving up D/C for available SNF bed; grossly 4-/5  AAlfonse AlpersPT, DPT  12/27/2020, 3:46 PM

## 2020-12-27 NOTE — Discharge Summary (Signed)
Physician Discharge Summary  Patient ID: Andrea [name] MRN: [id number] DOB/AGE: Aug 23, 1925 87 y.o.  Admit date: 12/05/2020 Discharge date: 12/28/2020  Discharge Diagnoses:  Principal Problem:   Femur fracture (HCC) Active Problems:   Pressure injury of skin   Hypoalbuminemia due to protein-calorie malnutrition (HCC)   Acute blood loss anemia   Postoperative pain DVT prophylaxis Hyperlipidemia Constipation Hypertension Stage II sacral pressure injury  Discharged Condition: Stable  Significant Diagnostic Studies: CT Head Wo Contrast  Result Date: 11/29/2020 CLINICAL DATA:  Polytrauma EXAM: CT HEAD WITHOUT CONTRAST CT CERVICAL SPINE WITHOUT CONTRAST TECHNIQUE: Multidetector CT imaging of the head and cervical spine was performed following the standard protocol without intravenous contrast. Multiplanar CT image reconstructions of the cervical spine were also generated. COMPARISON:  CT head dated January 25, 2017 FINDINGS: CT HEAD FINDINGS Brain: No evidence of acute infarction, hemorrhage, hydrocephalus, extra-axial collection or mass lesion/mass effect. Chronic white matter ischemic change. Vascular: No hyperdense vessel or unexpected calcification. Skull: Normal. Negative for fracture or focal lesion. Sinuses/Orbits: No acute finding. Other: Mild soft tissue swelling swelling overlying the right frontal bone. CT CERVICAL SPINE FINDINGS Alignment: Mild grade 1 anterolisthesis of C2 on C3 and mild retrolisthesis of C4 on C5, favored to be degenerative. Skull base and vertebrae: No acute fracture. No primary bone lesion or focal pathologic process. Soft tissues and spinal canal: No prevertebral fluid or swelling. No visible canal hematoma. Disc levels:  Multilevel moderate degenerative disc disease. Upper chest: Negative. Other: None IMPRESSION: No acute intracranial abnormality. No evidence of skull or cervical spine fracture. Electronically Signed   By: Allegra Lai M.D.   On: 11/29/2020 13:36    CT Cervical Spine Wo Contrast  Result Date: 11/29/2020 CLINICAL DATA:  Polytrauma EXAM: CT HEAD WITHOUT CONTRAST CT CERVICAL SPINE WITHOUT CONTRAST TECHNIQUE: Multidetector CT imaging of the head and cervical spine was performed following the standard protocol without intravenous contrast. Multiplanar CT image reconstructions of the cervical spine were also generated. COMPARISON:  CT head dated January 25, 2017 FINDINGS: CT HEAD FINDINGS Brain: No evidence of acute infarction, hemorrhage, hydrocephalus, extra-axial collection or mass lesion/mass effect. Chronic white matter ischemic change. Vascular: No hyperdense vessel or unexpected calcification. Skull: Normal. Negative for fracture or focal lesion. Sinuses/Orbits: No acute finding. Other: Mild soft tissue swelling swelling overlying the right frontal bone. CT CERVICAL SPINE FINDINGS Alignment: Mild grade 1 anterolisthesis of C2 on C3 and mild retrolisthesis of C4 on C5, favored to be degenerative. Skull base and vertebrae: No acute fracture. No primary bone lesion or focal pathologic process. Soft tissues and spinal canal: No prevertebral fluid or swelling. No visible canal hematoma. Disc levels:  Multilevel moderate degenerative disc disease. Upper chest: Negative. Other: None IMPRESSION: No acute intracranial abnormality. No evidence of skull or cervical spine fracture. Electronically Signed   By: Allegra Lai M.D.   On: 11/29/2020 13:36   CT KNEE RIGHT WO CONTRAST  Result Date: 11/29/2020 CLINICAL DATA:  Fracture, knee EXAM: CT OF THE right KNEE WITHOUT CONTRAST TECHNIQUE: Multidetector CT imaging of the right knee was performed according to the standard protocol. Multiplanar CT image reconstructions were also generated. COMPARISON:  Radiograph 11/30/2018 FINDINGS: Bones/Joint/Cartilage Diffuse osteopenia. There is an impacted fracture through the distal femoral metaphysis, which extends into the knee joint through the intercondylar notch  anteriorly. There is half shaft posterior displacement, approximately 1.5 cm, and 1.7 cm lateral displacement. There is lateral angulation. There is a small linear ossific fragment at the expected location of the [facility]  attachment on the medial femoral condyle (coronal series 5, image 83). There is severe tricompartment osteoarthritis with articular surface irregularity and moderate-severe joint space narrowing. Chondrocalcinosis. Small joint effusion. Patella baja. Ligaments Suboptimally assessed by CT. Muscles and Tendons No intramuscular fluid collection.  Extensive edema. Soft tissues Superficial soft tissue swelling.  No focal fluid collection. IMPRESSION: Displaced, angulated, and impacted distal femoral metaphyseal fracture, with intra-articular extension into the knee joint through the intercondylar notch. Small joint effusion. Diffuse osteopenia. Possible small proximal MCL avulsion fracture. Severe tricompartment osteoarthritis. Electronically Signed   By: Caprice Renshaw M.D.   On: 11/29/2020 16:22   Chest Portable 1 View  Result Date: 11/29/2020 CLINICAL DATA:  Preoperative. Right knee fracture. History of hypertension EXAM: PORTABLE CHEST 1 VIEW COMPARISON:  Chest x-ray 01/25/17 FINDINGS: The heart and mediastinal contours unchanged.  Aortic calcification. No focal consolidation. No pulmonary edema. No pleural effusion. No pneumothorax. No acute osseous abnormality. Bilateral shoulder degenerative changes. IMPRESSION: No active disease. Electronically Signed   By: Tish Frederickson M.D.   On: 11/29/2020 16:31   DG Knee Complete 4 Views Right  Result Date: 11/29/2020 CLINICAL DATA:  Fall, pain and swelling of the right knee EXAM: RIGHT KNEE - COMPLETE 4+ VIEW COMPARISON:  None. FINDINGS: Osteopenia. There is a transverse, impacted fracture of the distal right femoral metadiaphysis. There is no definite intercondylar fracture component appreciated. Severe tricompartmental arthrosis. Soft tissue edema  anteriorly. IMPRESSION: 1. Transverse, impacted fracture of the distal right femoral metadiaphysis. There is no definite intercondylar fracture component appreciated, however assessment of fracture anatomy is limited by osteopenia. 2. Severe tricompartmental arthrosis. Electronically Signed   By: Lauralyn Primes M.D.   On: 11/29/2020 13:39   DG Knee Right Port  Result Date: 11/30/2020 CLINICAL DATA:  Postop EXAM: PORTABLE RIGHT KNEE - 1-2 VIEW COMPARISON:  11/29/2020 FINDINGS: Interval surgical plate and multiple screw fixation of comminuted distal femoral fracture with intact hardware and near anatomic alignment. Gas in the soft tissues consistent with recent surgery. Severe degenerative changes at the knee IMPRESSION: Interval surgical fixation of comminuted distal femoral fracture with expected postsurgical change. Electronically Signed   By: Jasmine Pang M.D.   On: 11/30/2020 20:31   DG C-Arm 1-60 Min-No Report  Result Date: 11/30/2020 Fluoroscopy was utilized by the requesting physician.  No radiographic interpretation.   DG FEMUR, MIN 2 VIEWS RIGHT  Result Date: 11/30/2020 CLINICAL DATA:  ORIF right distal femur EXAM: RIGHT FEMUR 2 VIEWS COMPARISON:  CT knee dated 11/29/2020 FLUOROSCOPY TIME:  1 minute 5 seconds 3.98 mGy FINDINGS: Intraoperative fluoroscopic radiographs during lateral compression plate and screw fixation of a comminuted distal femur fracture. Fracture fragments are in near anatomic alignment and position. IMPRESSION: Intraoperative fluoroscopic radiographs during ORIF of a distal femur fracture, as above. Electronically Signed   By: Charline Bills M.D.   On: 11/30/2020 21:14   VAS [place] LOWER EXTREMITY VENOUS (DVT)  Result Date: 12/06/2020  Lower Venous DVT Study Patient Name:  ERICCA ZUHLKE  Date of Exam:   12/06/2020 Medical Rec #: [id number]      Accession #:    [id number] Date of Birth: 18-Oct-1925       Patient Gender: F Patient Age:   30 years Exam Location:  [facility]  Procedure:      VAS [place] LOWER EXTREMITY VENOUS (DVT) Referring Phys: Mariam Dollar --------------------------------------------------------------------------------  Indications: Swelling of RLE post femur fracture/surgical repair.  Risk Factors: Surgery RLE ORIF of femur fracture (11/30/20) Trauma Recent fall  with RLE femur fracture. Limitations: Poor ultrasound/tissue interface and edema. Comparison Study: No previous exams Performing Technologist: Jody Hill RVT, RDMS  Examination Guidelines: A complete evaluation includes B-mode imaging, spectral Doppler, color Doppler, and power Doppler as needed of all accessible portions of each vessel. Bilateral testing is considered an integral part of a complete examination. Limited examinations for reoccurring indications may be performed as noted. The reflux portion of the exam is performed with the patient in reverse Trendelenburg.  +---------+---------------+---------+-----------+----------+--------------+ RIGHT    CompressibilityPhasicitySpontaneityPropertiesThrombus Aging +---------+---------------+---------+-----------+----------+--------------+ CFV      Full           Yes      Yes                                 +---------+---------------+---------+-----------+----------+--------------+ SFJ      Full                                                        +---------+---------------+---------+-----------+----------+--------------+ FV Prox  Full           Yes      Yes                                 +---------+---------------+---------+-----------+----------+--------------+ FV Mid   Full           Yes      Yes                                 +---------+---------------+---------+-----------+----------+--------------+ FV DistalFull           Yes      Yes                                 +---------+---------------+---------+-----------+----------+--------------+ PFV      Full                                                         +---------+---------------+---------+-----------+----------+--------------+ POP      Full           Yes      No                                  +---------+---------------+---------+-----------+----------+--------------+ PTV      Full                                                        +---------+---------------+---------+-----------+----------+--------------+ PERO     Full                                                        +---------+---------------+---------+-----------+----------+--------------+   +---------+---------------+---------+-----------+----------+--------------+  LEFT     CompressibilityPhasicitySpontaneityPropertiesThrombus Aging +---------+---------------+---------+-----------+----------+--------------+ CFV      Full           Yes      Yes                                 +---------+---------------+---------+-----------+----------+--------------+ SFJ      Full                                                        +---------+---------------+---------+-----------+----------+--------------+ FV Prox  Full           Yes      Yes                                 +---------+---------------+---------+-----------+----------+--------------+ FV Mid   Full           Yes      Yes                                 +---------+---------------+---------+-----------+----------+--------------+ FV DistalFull           Yes      Yes                                 +---------+---------------+---------+-----------+----------+--------------+ PFV      Full                                                        +---------+---------------+---------+-----------+----------+--------------+ POP      Full           Yes      Yes                                 +---------+---------------+---------+-----------+----------+--------------+ PTV      Full                                                         +---------+---------------+---------+-----------+----------+--------------+ PERO     Full                                                        +---------+---------------+---------+-----------+----------+--------------+     Summary: BILATERAL: - No evidence of deep vein thrombosis seen in the lower extremities, bilaterally. - No evidence of superficial venous thrombosis in the lower extremities, bilaterally. -No evidence of popliteal cyst, bilaterally. RIGHT: Subcutaneous edema throughout RLE   *See table(s) above for measurements and observations. Electronically signed by Sherald Hess MD on 12/06/2020 at 6:52:47 PM.  Final     Labs:  Basic Metabolic Panel: No results for input(s): NA, K, CL, CO2, GLUCOSE, BUN, CREATININE, CALCIUM, MG, PHOS in the last 168 hours.  CBC: No results for input(s): WBC, NEUTROABS, HGB, HCT, MCV, PLT in the last 168 hours.  CBG: No results for input(s): GLUCAP in the last 168 hours.  Family history.  Mother with CAD Father with congestive heart failure.  Denies any colon cancer esophageal cancer or rectal cancer  Brief HPI:   Andrea [name] is a 87 y.o. 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  Patient lives alone mobile home ramped entrance.  Presented 11/29/2020 with mechanical fall while feeding her cats without loss of consciousness.  Cranial CT scan CT cervical spine negative.  Sustained right distal femur fracture with intercondylar extension.  Underwent ORIF 12/02/2020 per Dr. Carola Frost.  Nonweightbearing right lower extremity.  Acute blood loss anemia 8.0 and monitored.  Placed on Lovenox for DVT prophylaxis.  Therapy evaluations completed due to patient decreased functional ability was admitted for a comprehensive rehab program.   Hospital Course: Andrea [name] was admitted to rehab 12/05/2020 for inpatient therapies to  consist of PT, ST and OT at least three hours five days a week. Past admission physiatrist, therapy team and rehab RN have worked together to provide customized collaborative inpatient rehab.  Pertaining to patient's right distal femur fracture intercondylar extension secondary to fall status post ORIF 12/02/2020 nonweightbearing.  Patient follow-up orthopedic services.  Lovenox for DVT prophylaxis vascular studies negative she also continue low-dose aspirin.  Femur fracture right knee pain Tylenol as needed as well as Voltaren gel.  Acute blood loss anemia stable latest hemoglobin 10.6.  Hyperlipidemia Lipitor ongoing.  Blood pressure monitored with low-dose Norvasc.  Hormone supplement for hypothyroidism.  History of CVA with resulting gait abnormality maintained on aspirin.  Constipation resolved with laxative assistance.  Stage II sacral pressure injury offload every 2 hours wound care as directed.   Blood pressures were monitored on TID basis and soft and monitored     Rehab course: During patient's stay in rehab weekly team conferences were held to monitor patient's progress, set goals and discuss barriers to discharge. At admission, patient required minimal assist upper body bathing max is lower body bathing max assist lateral scoot transfers  Physical exam.  Blood pressure 113/51 pulse 88 temperature 98 respirations 18 oxygen saturation 97% room air Constitutional.  No acute distress HEENT Head.  Normocephalic and atraumatic Eyes.  Pupils round and reactive to light no discharge without nystagmus Neck.  Supple nontender no JVD without thyromegaly Cardiac regular rate rhythm not extra sounds or murmur heard Abdomen.  Soft nontender positive bowel sounds without rebound Respiratory effort normal no respiratory distress without wheeze Neurologic.  Alert oriented good insight and awareness.  Motor 4+/5 upper extremity bilaterally right lower extremity 1/5 inhibited by pain  He/She  has had  improvement in activity tolerance, balance, postural control as well as ability to compensate for deficits. He/She has had improvement in functional use RUE/LUE  and RLE/LLE as well as improvement in awareness.  Sessions with emphasis on functional mobility transfers maintaining weightbearing precautions.  Transferred wheelchair to mat sliding board to the right with supervision but total assist to place board.  Completed perineal bathing at bed level moderate assist and patient stepping in as much as possible to clean and don brief.  Supine edge of bed  moderate assist.  Completed upper body bathing dressing with set up assist lower body bathing dressing mod max assist.  Due to limited support at home recommendation for discharge to skilled nursing facility.       Disposition: Discharged to skilled nursing facility    Diet: Regular  Special Instructions: No driving smoking or alcohol  Nonweightbearing right lower extremity  Santyl applied to sacral wound change as needed  Medications at discharge. 1.  Tylenol as needed 2.  Norvasc 2.5 mg p.o. daily 3.  Vitamin C 1000 mg p.o. daily 4.  Aspirin 81 mg p.o. daily 5.  Lipitor 40 mg p.o. daily 6.  Voltaren gel 2 g 4 times daily 7.  Colace 100 mg p.o. twice daily 8.  Pepcid 20 mg p.o. nightly 9.  Hydrocodone 5-325 mg 1 tablet daily as needed pain 10.  Synthroid 88 mcg p.o. daily 11.  Melatonin 3 mg p.o. nightly 12.  Multivitamin daily  30-35 minutes were spent completing discharge summary and discharge planning  Discharge Instructions     Ambulatory referral to Physical Medicine Rehab   Complete by: As directed    Follow-up 1 month patient for skilled nursing facility placement status post right femur fracture/multi medical        Contact information for follow-up providers     Raulkar, Drema Pry, MD Follow up.   Specialty: Physical Medicine and Rehabilitation Why: 03/05/21 please arrive at 10:40am for 11:00am appointment,  thank you! Contact information: [address] [place] [address] [phone]         Myrene Galas, MD Follow up.   Specialty: Orthopedic Surgery Why: Call for appointment Contact information: [address] [place] [address] [phone]              Contact information for after-discharge care     Destination     [facility] .   Service: Skilled Nursing Contact information: [address] [place] [address] [phone]                     Signed: Charlton Amor 12/27/2020, 2:36 PM

## 2020-12-27 NOTE — Progress Notes (Signed)
Occupational Therapy Discharge Summary  Patient Details  Name: Andrea [name] MRN: [id number] Date of Birth: [birth year]   Patient has met 5 of 9 long term goals due to improved activity tolerance, improved balance, and postural control.  Patient to discharge at overall  min-mod assist  level.  Patient's care partner unavailable to provide the necessary physical and cognitive assistance at discharge.  Patient continues to require mod multimodal cues for sequencing and problem solving and recall of education.  Pt continues to require assistance for anterior weight shift for slide board transfers and sit > stand and cues for NWB through RLE.    Reasons goals not met: Pt continues to require max assist for LB dressing, sit > stand, and dynamic standing due to decreased anterior weight shift and difficulty maintaining NWB through RLE.  Recommendation:  Patient will benefit from ongoing skilled OT services in skilled nursing facility setting to continue to advance functional skills in the area of BADL and Reduce care partner burden.  Equipment: No equipment provided  Reasons for discharge: discharge from hospital  Patient/family agrees with progress made and goals achieved: Yes  OT Discharge Precautions/Restrictions  Precautions Precautions: Fall Restrictions Weight Bearing Restrictions: Yes RLE Weight Bearing: Non weight bearing General   Vital Signs Therapy Vitals Temp: 98.8 F (37.1 C) Temp Source: Oral Pulse Rate: 79 Resp: 17 BP: (!) 120/56 Patient Position (if appropriate): Sitting Oxygen Therapy SpO2: 99 % O2 Device: Room Air Pain Pain Assessment Pain Scale: 0-10 Pain Score: 0-No pain ADL ADL Equipment Provided: Reacher Eating: Independent Where Assessed-Eating: Chair Grooming: Setup Where Assessed-Grooming: Sitting at sink Upper Body Bathing: Supervision/safety, Setup Where Assessed-Upper Body Bathing: Sitting at sink Lower Body Bathing: Moderate assistance Where  Assessed-Lower Body Bathing: Bed level, Edge of bed Upper Body Dressing: Supervision/safety, Setup Where Assessed-Upper Body Dressing: Sitting at sink Lower Body Dressing: Maximal assistance Where Assessed-Lower Body Dressing: Edge of bed, Bed level Toileting: Maximal assistance Where Assessed-Toileting: Bed level Toilet Transfer: Moderate assistance Toilet Transfer Method: Theatre manager: Drop arm bedside commode Vision Baseline Vision/History: 0 No visual deficits Patient Visual Report: No change from baseline Vision Assessment?: No apparent visual deficits Perception  Perception: Within Functional Limits Praxis Praxis: Intact Cognition Overall Cognitive Status: Impaired/Different from baseline Arousal/Alertness: Awake/alert Orientation Level: Oriented to person;Oriented to place;Oriented to situation Year:  (unable to complete due to short notice of SNF d/c) Attention: Selective Selective Attention: Appears intact Memory: Impaired Awareness: Impaired Problem Solving: Impaired Safety/Judgment: Impaired Sensation Sensation Light Touch: Impaired by gross assessment Proprioception: Impaired by gross assessment Additional Comments: decreased awareness of placement of BLE during attempts at sit > stand and decreased sensation in feet with LB dressing and bathing Coordination Gross Motor Movements are Fluid and Coordinated: No Fine Motor Movements are Fluid and Coordinated: No Coordination and Movement Description: grossly uncoordinated due to pain, decreased balance, generalized weakness/deconditioning, and difficutly adhering to RLE NWB precautions Finger Nose Finger Test: WFL on RUE, slower with dysmetria on LUE due to previous CVA Motor  Motor Motor: Abnormal postural alignment and control;Hemiplegia Motor - Skilled Clinical Observations: grossly uncoordinated due to L hemiparesis, generalized weakness/deconditioning, decreased balance/postural  conotrl, and altered balance strategies. Mobility  Bed Mobility Bed Mobility: Rolling Right;Rolling Left;Supine to Sit;Sit to Supine Rolling Right: Supervision/verbal cueing Rolling Left: Supervision/Verbal cueing Supine to Sit: Supervision/Verbal cueing Sit to Supine: Supervision/Verbal cueing Transfers Sit to Stand: Maximal Assistance - Patient 25-49% Stand to Sit: Minimal Assistance - Patient > 75%  Balance Static Sitting Balance  Static Sitting - Balance Support: Feet unsupported;Bilateral upper extremity supported Static Sitting - Level of Assistance: 5: Stand by assistance Dynamic Sitting Balance Dynamic Sitting - Balance Support: Feet supported;Bilateral upper extremity supported;During functional activity Dynamic Sitting - Level of Assistance: 5: Stand by assistance Dynamic Sitting - Balance Activities: Lateral lean/weight shifting;Forward lean/weight shifting Sitting balance - Comments: during LB bathing/dressing Extremity/Trunk Assessment RUE Assessment RUE Assessment: Within Functional Limits General Strength Comments: grossly 4+/5 LUE Assessment LUE Assessment: Exceptions to [facility] Active Range of Motion (AROM) Comments: WNL active ROM in gravity eliminated, able to use as gross assist during self-care tasks General Strength Comments: strength grossly 3+/5 and weak grasp   Halsey Hammen, [facility] 12/27/2020, 3:59 PM

## 2020-12-27 NOTE — Progress Notes (Signed)
Occupational Therapy Session Note  Patient Details  Name: Andrea [name] MRN: [id number] Date of Birth: 1925/11/25  Today's Date: 12/27/2020 OT Individual Time: 1305-1402 OT Individual Time Calculation (min): 57 min    Short Term Goals: Week 3:  OT Short Term Goal 1 (Week 3): Pt will complete toilet transfer with max assist of one caregiver OT Short Term Goal 2 (Week 3): Pt will complete toileting with max assist OT Short Term Goal 3 (Week 3): Pt will complete LB dressing with mod assist at sit > stand level  Skilled Therapeutic Interventions/Progress Updates:  Skilled OT intervention completed with focus on strengthening and endurance to increase independence during functional ADLs. Pt received seated in w/c, agreeable to therapy session to be completed partly outside for increased arousal. Pt completed BUE exercises using theraband and handout given in previous session, with mod cues and demonstrations required for recall of hand placement and mechanisms of each set. Transported back to pt's room via w/c, then completed seated core twists with 4 # med ball for 2x10. Pt pleased with receiving therapy outside, and complimentary of all staff. Pt left seated in w/c, chair alarm activated, and all needs in reach at end of session.   Therapy Documentation Precautions:  Precautions Precautions: Fall Restrictions Weight Bearing Restrictions: Yes RLE Weight Bearing: Non weight bearing  Vital Signs: Therapy Vitals Temp: 98.8 F (37.1 C) Temp Source: Oral Pulse Rate: 79 Resp: 17 BP: (!) 120/56 Patient Position (if appropriate): Sitting Oxygen Therapy SpO2: 99 % O2 Device: Room Air  Pain: Pt with no c/o pain today.  Therapy/Group: Individual Therapy  [name] 12/27/2020, 3:04 PM

## 2020-12-27 NOTE — Progress Notes (Signed)
Occupational Therapy Session Note  Patient Details  Name: Andrea [name] MRN: [id number] Date of Birth: [birth year]  Today's Date: 12/27/2020 OT Individual Time: 0732-0829 OT Individual Time Calculation (min): 57 min    Short Term Goals: Week 3:  OT Short Term Goal 1 (Week 3): Pt will complete toilet transfer with max assist of one caregiver OT Short Term Goal 2 (Week 3): Pt will complete toileting with max assist OT Short Term Goal 3 (Week 3): Pt will complete LB dressing with mod assist at sit > stand level  Skilled Therapeutic Interventions/Progress Updates:    Treatment session with focus on self-care retraining with bathing/dressing at sit > stand level from EOB and functional transfers. Pt received supine in bed agreeable to therapy session.  Pt noted to have been incontinent of urine, therefore engaged in perineal hygiene at bed level with total assist from therapist while pt rolled Supervision.  Pt completed sidelying to sitting at EOB with supervision.  Engaged in LB bathing and dressing seated EOB with pt demonstrating increased reach when washing lower legs and donning pants.  Noted pt with decreased sensation in feet with getting caught in pant legs during dressing and pt standing on therapist's foot with LLE with no awareness.  Pt unable to complete sit > stand with max of one this session, requiring +2 from elevated surface to stand.  Therapist pulled pants over hips while maintaining standing with +2 assist.  Pt completed slide board transfer with total assist setup of slide board and mod assist for transfer due to decreased anterior weight shift.  Pt engaged in UB bathing/dressing and grooming seated at sink with setup assist.  Pt remained upright in w/c with chair alarm on and all needs in reach.  Therapy Documentation Precautions:  Precautions Precautions: Fall Restrictions Weight Bearing Restrictions: Yes RLE Weight Bearing: Non weight bearing Pain: Pain Assessment Pain  Scale: 0-10 Pain Score: 2  Pain Type: Chronic pain Pain Location: Knee Pain Orientation: Right Pain Descriptors / Indicators: Aching Pain Frequency: Intermittent Pain Onset: Awakened from sleep Pain Intervention(s): Medication (See eMAR)    Therapy/Group: Individual Therapy  Rosalio Loud 12/27/2020, 8:51 AM

## 2020-12-27 NOTE — Plan of Care (Signed)
Patient with stage 2 to buttocks needs others to provide wound care Patient is incontinent despite timed/prompted toileting

## 2020-12-28 NOTE — Progress Notes (Addendum)
Discharge--Inpatient Rehabilitation Medication Review by a Pharmacist  A complete drug regimen review was completed for this patient to identify any potential clinically significant medication issues.  High Risk Drug Classes Is patient taking? Indication by Medication  Antipsychotic No   Anticoagulant No   Antibiotic No   Opioid Yes Hydrocodone s/p femur fx.  Antiplatelet Yes ASA for h/o CVA  Hypoglycemics/insulin No   Vasoactive Medication Yes Norvasc for HTN  Chemotherapy No   Other No      Clinically significant medication issues were identified that warrant physician communication and completion of prescribed/recommended actions by midnight of the next day:  No  Time spent performing this drug regimen review (minutes):   [name] [name], PharmD, BCPS Clinical Staff Pharmacist [email or web address]   [name] 12/28/2020 9:40 AM

## 2020-12-28 NOTE — Progress Notes (Signed)
Inpatient Rehabilitation Care Coordinator Discharge Note   Patient Details  Name: Andrea [name] MRN: [id number] Date of Birth: June 08, 1925   Discharge location: [facility]  Length of Stay: 23 DAYS  Discharge activity level: MIN-MOD LEVEL  Home/community participation: ACTIVE  Patient response QA:STMHDQ Literacy - How often do you need to have someone help you when you read instructions, pamphlets, or other written material from your doctor or pharmacy?: Sometimes  Patient response QI:WLNLGX Isolation - How often do you feel lonely or isolated from those around you?: Never  Services provided included: MD, RD, PT, OT, SLP, RN, CM, Pharmacy, SW  Financial Services:  Financial Services Utilized: Medicare    Choices offered to/list presented to: PT  Follow-up services arranged:  Other (Comment) SNF           Patient response to transportation need: Is the patient able to respond to transportation needs?: Yes In the past 12 months, has lack of transportation kept you from medical appointments or from getting medications?: No In the past 12 months, has lack of transportation kept you from meetings, work, or from getting things needed for daily living?: No    Comments (or additional information):PT IS NOT ABLE TO RETURN HOME DUE TO HAS NO CAREGIVER JUST HAS INTERMITTENT ASSIST. WILL NEED TO BE MOD/I BEFORE RETURNING HOME. PT DOES HAVE SOME MEMORY ISSUES AND RECALL WHICH WILL MAKE IT DIFFICULT TO RETURN HOME INDEPENDENTLY. Andrea [name] IS AWARE OF THIS PLAN.  Patient/Family verbalized understanding of follow-up arrangements:  Yes  Individual responsible for coordination of the follow-up plan: Andrea [name] [phone]  Confirmed correct DME delivered: Andrea [name]    Andrea [name], Andrea [name]

## 2020-12-28 NOTE — Progress Notes (Signed)
PROGRESS NOTE   Subjective/Complaints: No complaints this morning.   ROS: Patient denies fever, rash, sore throat, blurred vision, nausea, vomiting, diarrhea, cough, shortness of breath or chest pain, joint or back pain, headache, or mood change, + insomnia, +hip pain- well controlled  Objective:   No results found. No results for input(s): WBC, HGB, HCT, PLT in the last 72 hours.   No results for input(s): NA, K, CL, CO2, GLUCOSE, BUN, CREATININE, CALCIUM in the last 72 hours.   Intake/Output Summary (Last 24 hours) at 12/28/2020 0951 Last data filed at 12/28/2020 0700 Gross per 24 hour  Intake 480 ml  Output --  Net 480 ml      Pressure Injury 12/05/20 Buttocks Left Stage 2 -  Partial thickness loss of dermis presenting as a shallow open injury with a red, pink wound bed without slough. A stage 2 measuring 1cmx1cm and surrounding skin is nonblaching. entire area measures 8.5cmx (Active)  12/05/20 1700  Location: Buttocks  Location Orientation: Left  Staging: Stage 2 -  Partial thickness loss of dermis presenting as a shallow open injury with a red, pink wound bed without slough.  Wound Description (Comments): A stage 2 measuring 1cmx1cm and surrounding skin is nonblaching. entire area measures 8.5cmx3.7cm.  Present on Admission: Yes    Physical Exam: Vital Signs Blood pressure (!) 148/58, pulse (!) 58, temperature 97.7 F (36.5 C), temperature source Oral, resp. rate 15, height 5\' 4"  (1.626 m), weight 72.4 kg, SpO2 97 %. Gen: no distress, normal appearing HEENT: oral mucosa pink and moist, NCAT Cardio: Bradycardic Chest: normal effort, normal rate of breathing Abd: soft, non-distended Ext: no edema Psych: pleasant, normal affect  Skin: Warm and dry.  Incision with dressing CDI, sacral pressure injury Musc: Right thigh with edema and tenderness, l Neuro: Alert Motor: Bilateral upper extremities: 4+/5 proximal  distal Right lower extremity: Hip flexion, knee extension 2+/5, ankle dorsiflexion 4+/5  Left lower extremity: Hip flexion, knee extension 3+/5, ankle dorsiflexion 4+/5  Assessment/Plan: 1. Functional deficits which require 3+ hours per day of interdisciplinary therapy in a comprehensive inpatient rehab setting. Physiatrist is providing close team supervision and 24 hour management of active medical problems listed below. Physiatrist and rehab team continue to assess barriers to discharge/monitor patient progress toward functional and medical goals  Care Tool:  Bathing    Body parts bathed by patient: Right arm, Left arm, Chest, Abdomen, Face, Right upper leg, Left upper leg, Front perineal area   Body parts bathed by helper: Right lower leg, Left lower leg, Buttocks     Bathing assist Assist Level: Minimal Assistance - Patient > 75%     Upper Body Dressing/Undressing Upper body dressing   What is the patient wearing?: Pull over shirt    Upper body assist Assist Level: Set up assist    Lower Body Dressing/Undressing Lower body dressing      What is the patient wearing?: Pants, Incontinence brief     Lower body assist Assist for lower body dressing: Maximal Assistance - Patient 25 - 49%     Toileting Toileting    Toileting assist Assist for toileting: Maximal Assistance - Patient 25 - 49%  Transfers Chair/bed transfer  Transfers assist     Chair/bed transfer assist level: Supervision/Verbal cueing Chair/bed transfer assistive device: Sliding board   Locomotion Ambulation   Ambulation assist   Ambulation activity did not occur: Safety/medical concerns          Walk 10 feet activity   Assist  Walk 10 feet activity did not occur: Safety/medical concerns (pain, weakness, decreased balance, poor adherance to RLE NWB precautions)        Walk 50 feet activity   Assist Walk 50 feet with 2 turns activity did not occur: Safety/medical concerns          Walk 150 feet activity   Assist Walk 150 feet activity did not occur: Safety/medical concerns         Walk 10 feet on uneven surface  activity   Assist Walk 10 feet on uneven surfaces activity did not occur: Safety/medical concerns         Wheelchair     Assist Is the patient using a wheelchair?: Yes Type of Wheelchair: Manual Wheelchair activity did not occur: Safety/medical concerns  Wheelchair assist level: Supervision/Verbal cueing Max wheelchair distance: 88ft    Wheelchair 50 feet with 2 turns activity    Assist    Wheelchair 50 feet with 2 turns activity did not occur: Safety/medical concerns   Assist Level: Supervision/Verbal cueing   Wheelchair 150 feet activity     Assist  Wheelchair 150 feet activity did not occur: Safety/medical concerns       Blood pressure (!) 148/58, pulse (!) 58, temperature 97.7 F (36.5 C), temperature source Oral, resp. rate 15, height 5\' 4"  (1.626 m), weight 72.4 kg, SpO2 97 %.  Medical Problem List and Plan: 1.  Right distal femur fracture with intercondylar extension secondary to fall.  Status post ORIF 12/02/2020.  Nonweightbearing  Cd/c to SNF today  Max A 1 standing, will plan for SNF.  2.  Impaired mobility: -DVT/anticoagulation: Vascular study negative.  Pharmaceutical: Continue Lovenox             -antiplatelet therapy: Aspirin 81 mg daily 3. Femur fracture/right knee pain: Continue Tylenol PRN. Discontinue Hydrocodone. Voltaren gel 2mg  QID   Controlled on 9/29 4. Mood: Provide emotional support             -antipsychotic agents: N/A 5. Neuropsych: This patient is capable of making decisions on her own behalf. 6. Stage 2 sacral pressure injury: offload q2H, wound care. Continue vitamin C 1,000mg  daily to promote wound healing.  7. Fluids/Electrolytes/Nutrition: Routine in and outs 8.  Acute blood loss anemia.    Hemoglobin 8.2 on 9/7--> 10.6 9/19 9.  Hyperlipidemia: Lipitor 10.  Restart  Norvasc 2.5mg  daily- continue current dose 11.  Hypothyroidism.  Continue Synthroid 12.  History of CVA with resulting gait abnormality. Continue aspirin 13. Overweight BMI 26.98: provide counseling.  14. Urine with foul strong odor- patient is alert and oriented and bright with no urinary complaints, afebrile- no longer has odor, no sx.  15. Constipation: Continue colace 100mg  BID 16.  Sleep disturbance: restart melatonin 3mg  HS 17.  Hypoalbuminemia: encourage high protein foods 18. Incontinent of urine: continue bowel and bladder program 19: Disposition: d/c to SNF once bed available. HFU scheduled.   LOS: 23 days A FACE TO FACE EVALUATION WAS PERFORMED  10/29 Andrea [name] 12/28/2020, 9:51 AM

## 2020-12-28 NOTE — Progress Notes (Signed)
Patient to be D/C' d today to [facility] Report called to charge nurse Harriett Sine. Patient has no questions or concerns at this time. Denies pain or discomfort. Pharmacy called no medications stored in pharmacy. Patients items packed and ready to be transported.

## 2021-03-05 ENCOUNTER — Encounter
Payer: Medicare Other | Attending: Physical Medicine and Rehabilitation | Admitting: Physical Medicine and Rehabilitation

## 2021-04-30 ENCOUNTER — Other Ambulatory Visit: Payer: Self-pay | Admitting: Physician Assistant

## 2021-04-30 ENCOUNTER — Other Ambulatory Visit (HOSPITAL_COMMUNITY): Payer: Self-pay | Admitting: Family Medicine

## 2021-04-30 ENCOUNTER — Other Ambulatory Visit: Payer: Self-pay | Admitting: Family Medicine

## 2021-04-30 DIAGNOSIS — R2241 Localized swelling, mass and lump, right lower limb: Secondary | ICD-10-CM

## 2021-05-07 ENCOUNTER — Other Ambulatory Visit: Payer: Self-pay

## 2021-05-07 ENCOUNTER — Ambulatory Visit (HOSPITAL_COMMUNITY)
Admission: RE | Admit: 2021-05-07 | Discharge: 2021-05-07 | Disposition: A | Discharge status: Home / Self Care | Payer: Medicare Other | Source: Ambulatory Visit | Attending: Family Medicine | Admitting: Family Medicine

## 2021-05-07 DIAGNOSIS — R2241 Localized swelling, mass and lump, right lower limb: Secondary | ICD-10-CM | POA: Diagnosis not present

## 2021-07-02 ENCOUNTER — Other Ambulatory Visit: Payer: Self-pay

## 2021-07-02 ENCOUNTER — Encounter (HOSPITAL_COMMUNITY): Payer: Self-pay

## 2021-07-02 ENCOUNTER — Emergency Department (HOSPITAL_COMMUNITY)
Admission: EM | Admit: 2021-07-02 | Discharge: 2021-07-02 | Disposition: A | Discharge status: Home / Self Care | Payer: Medicare Other | Attending: Emergency Medicine | Admitting: Emergency Medicine

## 2021-07-02 DIAGNOSIS — J189 Pneumonia, unspecified organism: Secondary | ICD-10-CM | POA: Insufficient documentation

## 2021-07-02 DIAGNOSIS — Z7982 Long term (current) use of aspirin: Secondary | ICD-10-CM | POA: Insufficient documentation

## 2021-07-02 DIAGNOSIS — R059 Cough, unspecified: Secondary | ICD-10-CM | POA: Diagnosis present

## 2021-07-02 MED ORDER — AMOXICILLIN-POT CLAVULANATE 875-125 MG PO TABS: 1.0000 | ORAL_TABLET | Freq: Two times a day (BID) | ORAL | 0 refills | Status: AC

## 2021-07-02 NOTE — ED Provider Notes (Signed)
?[facility] ?Provider Note ? ? ?CSN: [id number] ?Arrival date & time: 07/02/21  1804 ? ?  ? ?History ? ?Chief Complaint  ?Patient presents with  ? abnormal x-ray  ? ? ?Andrea [name] is a 88 y.o. female. ? ?Pt had an xray of her chest done today at the facility where she resides.  It was sent to the emergency department for evaluation of possible pneumonia on her chest x-ray.  Patient reports she has had a cough for the past week.  Patient tells me she did not feel like doing her physical therapy today so they did an x-ray of her chest.  Patient denies any shortness of breath she denies having any fever.  No known COVID or influenza exposure.  Patient reports she currently feels fine.  Patient states she did not want to come to the emergency department. ? ?The history is provided by the patient. No language interpreter was used.  ? ?  ? ?Home Medications ?Prior to Admission medications   ?Medication Sig Start Date End Date Taking? Authorizing Provider  ?amoxicillin-clavulanate (AUGMENTIN) 875-125 MG tablet Take 1 tablet by mouth 2 (two) times daily for 10 days. 07/02/21 07/12/21 Yes Elson Areas, PA-C  ?acetaminophen (TYLENOL) 325 MG tablet Take 2 tablets (650 mg total) by mouth every 6 (six) hours as needed for mild pain. 12/27/20   Angiulli, Mcarthur Rossetti, PA-C  ?amLODipine (NORVASC) 2.5 MG tablet Take 1 tablet (2.5 mg total) by mouth daily. 12/28/20   Angiulli, Mcarthur Rossetti, PA-C  ?ascorbic acid (VITAMIN C) 1000 MG tablet Take 1 tablet (1,000 mg total) by mouth daily. 12/28/20   Angiulli, Mcarthur Rossetti, PA-C  ?ASPIRIN ADULT LOW STRENGTH 81 MG EC tablet Take 81 mg by mouth daily. 10/13/20   [provider]  ?atorvastatin (LIPITOR) 40 MG tablet Take 1 tablet (40 mg total) by mouth daily at 6 PM. 12/27/20 01/26/21  Angiulli, Mcarthur Rossetti, PA-C  ?diclofenac Sodium (VOLTAREN) 1 % GEL Apply 2 g topically 4 (four) times daily. 12/27/20   Angiulli, Mcarthur Rossetti, PA-C  ?docusate sodium (COLACE) 100 MG capsule Take 1  capsule (100 mg total) by mouth 2 (two) times daily. 12/27/20   Angiulli, Mcarthur Rossetti, PA-C  ?famotidine (PEPCID) 20 MG tablet Take 1 tablet (20 mg total) by mouth at bedtime. 12/27/20   Angiulli, Mcarthur Rossetti, PA-C  ?HYDROcodone-acetaminophen (NORCO/VICODIN) 5-325 MG tablet Take 1 tablet by mouth daily as needed for moderate pain. 12/27/20   Angiulli, Mcarthur Rossetti, PA-C  ?levothyroxine (SYNTHROID) 88 MCG tablet Take 88 mcg by mouth daily. 10/13/20   [provider]  ?melatonin 3 MG TABS tablet Take 1 tablet (3 mg total) by mouth at bedtime. 12/27/20   Angiulli, Mcarthur Rossetti, PA-C  ?Multiple Vitamin (MULTIVITAMIN WITH MINERALS) TABS tablet Take 1 tablet by mouth daily. 12/28/20   Angiulli, Mcarthur Rossetti, PA-C  ?Vitamin D, Ergocalciferol, (DRISDOL) 1.25 MG (50000 UNIT) CAPS capsule Take 1 capsule (50,000 Units total) by mouth once a week. 12/27/20   Angiulli, Mcarthur Rossetti, PA-C  ?   ? ?Allergies    ?Patient has no known allergies.   ? ?Review of Systems   ?Review of Systems  ?Constitutional:  Negative for fever.  ?Respiratory:  Positive for cough.   ?All other systems reviewed and are negative. ? ?Physical Exam ?Updated Vital Signs ?BP (!) 157/70   Pulse 85   Temp 98.6 ?F (37 ?C) (Oral)   Resp 18   Ht 5\' 4"  (1.626 m)   Wt 73  kg   SpO2 96%   BMI 27.62 kg/m?  ?Physical Exam ?Vitals and nursing note reviewed.  ?Constitutional:   ?   Appearance: She is well-developed.  ?HENT:  ?   Head: Normocephalic.  ?   Right Ear: External ear normal.  ?   Left Ear: External ear normal.  ?   Nose: Nose normal.  ?   Mouth/Throat:  ?   Mouth: Mucous membranes are moist.  ?Eyes:  ?   Pupils: Pupils are equal, round, and reactive to light.  ?Cardiovascular:  ?   Rate and Rhythm: Normal rate and regular rhythm.  ?Pulmonary:  ?   Effort: Pulmonary effort is normal.  ?   Breath sounds: Normal breath sounds.  ?Abdominal:  ?   General: Abdomen is flat. There is no distension.  ?Musculoskeletal:     ?   General: Normal range of motion.  ?   Cervical back:  Normal range of motion and neck supple.  ?Neurological:  ?   Mental Status: She is alert and oriented to person, place, and time.  ?Psychiatric:     ?   Mood and Affect: Mood normal.  ? ? ?ED Results / Procedures / Treatments   ?Labs ?(all labs ordered are listed, but only abnormal results are displayed) ?Labs Reviewed - No data to display ? ?EKG ?None ? ?Radiology ?No results found. ? ?Procedures ?Procedures  ? ? ?Medications Ordered in ED ?Medications - No data to display ? ?ED Course/ Medical Decision Making/ A&P ?  ?                        ?Medical Decision Making ?Pt reports she feels well except for having a cough.  Pt does not feel like she needed to come to the hospital.   ? ?Amount and/or Complexity of Data Reviewed ?Independent Historian: caregiver ?   Details: I spoke with Rn at facility.  He reports pt has not had a fever or shortness of breath ?External Data Reviewed: radiology and notes. ?   Details: xray report from facility concerning for infiltrate ? ?Risk ?Prescription drug management. ?Risk Details: Pt does not want further evaluation.  Pt is agreeable to taking an antibiotic.  I will cover with Augmentin.   ? ? ? ? ? ? ? ? ? ?Final Clinical Impression(s) / ED Diagnoses ?Final diagnoses:  ?Community acquired pneumonia, unspecified laterality  ? ? ?Rx / DC Orders ?ED Discharge Orders   ? ?      Ordered  ?  amoxicillin-clavulanate (AUGMENTIN) 875-125 MG tablet  2 times daily       ? 07/02/21 2040  ? ?  ?  ? ?  ? ?An After Visit Summary was printed and given to the patient.  ?  ?Elson Areas, PA-C ?07/02/21 2223 ? ?  ?Mancel Bale, MD ?07/03/21 0011 ? ?

## 2021-07-02 NOTE — Discharge Instructions (Addendum)
Return if any problems.

## 2021-07-02 NOTE — ED Triage Notes (Signed)
Patient coming from [facility] for abnormal x-ray taken on 06/15/21. Patient denies any pain and only foot pain. Patient denies [facility]. Patient does not want anything done per her.  ?

## 2021-07-02 NOTE — ED Notes (Signed)
Attempted to call [facility] for discharge report. Unable to contact.  ?

## 2021-07-05 ENCOUNTER — Emergency Department (HOSPITAL_COMMUNITY)
Admission: EM | Admit: 2021-07-05 | Discharge: 2021-07-05 | Disposition: A | Discharge status: Skilled Nursing Facility | Payer: Medicare Other | Attending: Emergency Medicine | Admitting: Emergency Medicine

## 2021-07-05 ENCOUNTER — Emergency Department (HOSPITAL_COMMUNITY): Payer: Medicare Other

## 2021-07-05 ENCOUNTER — Encounter (HOSPITAL_COMMUNITY): Payer: Self-pay

## 2021-07-05 ENCOUNTER — Other Ambulatory Visit: Payer: Self-pay

## 2021-07-05 DIAGNOSIS — M25572 Pain in left ankle and joints of left foot: Secondary | ICD-10-CM | POA: Diagnosis present

## 2021-07-05 DIAGNOSIS — I10 Essential (primary) hypertension: Secondary | ICD-10-CM | POA: Insufficient documentation

## 2021-07-05 DIAGNOSIS — Z7982 Long term (current) use of aspirin: Secondary | ICD-10-CM | POA: Diagnosis not present

## 2021-07-05 DIAGNOSIS — Y92129 Unspecified place in nursing home as the place of occurrence of the external cause: Secondary | ICD-10-CM | POA: Diagnosis not present

## 2021-07-05 DIAGNOSIS — X501XXA Overexertion from prolonged static or awkward postures, initial encounter: Secondary | ICD-10-CM | POA: Diagnosis not present

## 2021-07-05 DIAGNOSIS — Z79899 Other long term (current) drug therapy: Secondary | ICD-10-CM | POA: Diagnosis not present

## 2021-07-05 MED ORDER — CEPHALEXIN 500 MG PO CAPS: 500.0000 mg | ORAL_CAPSULE | Freq: Three times a day (TID) | ORAL | 0 refills | Status: AC

## 2021-07-05 MED ORDER — CEPHALEXIN 500 MG PO CAPS
500.0000 mg | ORAL_CAPSULE | Freq: Once | ORAL | Status: AC
  Administered 2021-07-05: 500 mg via ORAL
  Filled 2021-07-05: qty 1

## 2021-07-05 NOTE — ED Triage Notes (Signed)
Pt arrives via EMS from [facility]. Pt states she was discharged from here on Sunday. Reports that when ems took her back to her facility on Sunday, she twisted her left ankle. Pt denies any other injury.  ?

## 2021-07-05 NOTE — Discharge Instructions (Addendum)
Return to the ED with any new symptoms such as decreased sensation in the foot ?Please follow-up with orthopedics.  I have referred you to a group.  Please call make an appointment on Monday ?Please continue taking antibiotics I prescribed you for suspected cellulitis of left foot ?Please remain in [facility] until you are seen and evaluated by orthopedics ?

## 2021-07-05 NOTE — ED Provider Notes (Signed)
? EMERGENCY DEPARTMENT ?Provider Note ? ? ?CSN: [id number] ?Arrival date & time: 07/05/21  1703 ? ?  ? ?History ? ?Chief Complaint  ?Patient presents with  ? Ankle Pain  ? ? ?Andrea Hatchetancy Tegtmeyer is a 88 y.o. female with medical history significant for hypertension and thyroid disease.  Patient presents ED for evaluation of left ankle pain.  Patient states that sometime in the last week she was brought out of her nursing facility in order to attend doctor's appointment.  Patient reports while she was being transported via EMS, her ankle was caught in a doorway causing it to twist.  Patient states that since this time, she has had increased left-sided ankle pain and swelling.  Patient states that she is still able to ambulate however with increased difficulty.  Patient denies any numbness or tingling into the leg. ? ? ?Ankle Pain ? ?  ? ?Home Medications ?Prior to Admission medications   ?Medication Sig Start Date End Date Taking? Authorizing Provider  ?acetaminophen (TYLENOL) 325 MG tablet Take 2 tablets (650 mg total) by mouth every 6 (six) hours as needed for mild pain. 12/27/20   Angiulli, Mcarthur Rossettianiel J, PA-C  ?amLODipine (NORVASC) 2.5 MG tablet Take 1 tablet (2.5 mg total) by mouth daily. 12/28/20   Angiulli, Mcarthur Rossettianiel J, PA-C  ?amoxicillin-clavulanate (AUGMENTIN) 875-125 MG tablet Take 1 tablet by mouth 2 (two) times daily for 10 days. 07/02/21 07/12/21  Elson AreasSofia, Leslie K, PA-C  ?ascorbic acid (VITAMIN C) 1000 MG tablet Take 1 tablet (1,000 mg total) by mouth daily. 12/28/20   Angiulli, Mcarthur Rossettianiel J, PA-C  ?ASPIRIN ADULT LOW STRENGTH 81 MG EC tablet Take 81 mg by mouth daily. 10/13/20   [provider]  ?atorvastatin (LIPITOR) 40 MG tablet Take 1 tablet (40 mg total) by mouth daily at 6 PM. 12/27/20 01/26/21  Angiulli, Mcarthur Rossettianiel J, PA-C  ?diclofenac Sodium (VOLTAREN) 1 % GEL Apply 2 g topically 4 (four) times daily. 12/27/20   Angiulli, Mcarthur Rossettianiel J, PA-C  ?docusate sodium (COLACE) 100 MG capsule Take 1 capsule (100 mg total) by  mouth 2 (two) times daily. 12/27/20   Angiulli, Mcarthur Rossettianiel J, PA-C  ?famotidine (PEPCID) 20 MG tablet Take 1 tablet (20 mg total) by mouth at bedtime. 12/27/20   Angiulli, Mcarthur Rossettianiel J, PA-C  ?HYDROcodone-acetaminophen (NORCO/VICODIN) 5-325 MG tablet Take 1 tablet by mouth daily as needed for moderate pain. 12/27/20   Angiulli, Mcarthur Rossettianiel J, PA-C  ?levothyroxine (SYNTHROID) 88 MCG tablet Take 88 mcg by mouth daily. 10/13/20   [provider]  ?melatonin 3 MG TABS tablet Take 1 tablet (3 mg total) by mouth at bedtime. 12/27/20   Angiulli, Mcarthur Rossettianiel J, PA-C  ?Multiple Vitamin (MULTIVITAMIN WITH MINERALS) TABS tablet Take 1 tablet by mouth daily. 12/28/20   Angiulli, Mcarthur Rossettianiel J, PA-C  ?Vitamin D, Ergocalciferol, (DRISDOL) 1.25 MG (50000 UNIT) CAPS capsule Take 1 capsule (50,000 Units total) by mouth once a week. 12/27/20   Angiulli, Mcarthur Rossettianiel J, PA-C  ?   ? ?Allergies    ?Patient has no known allergies.   ? ?Review of Systems   ?Review of Systems  ?Musculoskeletal:  Positive for arthralgias, gait problem and joint swelling.  ?Neurological:  Negative for numbness.  ?All other systems reviewed and are negative. ? ?Physical Exam ?Updated Vital Signs ?BP (!) 167/75 (BP Location: Right Arm)   Pulse 77   Temp 97.7 ?F (36.5 ?C) (Oral)   Resp 14   SpO2 97%  ?Physical Exam ?Vitals and nursing note reviewed.  ?Constitutional:   ?  General: She is not in acute distress. ?   Appearance: Normal appearance. She is not ill-appearing, toxic-appearing or diaphoretic.  ?HENT:  ?   Head: Normocephalic and atraumatic.  ?   Nose: Nose normal. No congestion.  ?   Mouth/Throat:  ?   Mouth: Mucous membranes are moist.  ?   Pharynx: Oropharynx is clear.  ?Eyes:  ?   Extraocular Movements: Extraocular movements intact.  ?   Conjunctiva/sclera: Conjunctivae normal.  ?   Pupils: Pupils are equal, round, and reactive to light.  ?Cardiovascular:  ?   Rate and Rhythm: Normal rate and regular rhythm.  ?   Pulses:     ?     Dorsalis pedis pulses are 2+ on the  right side and 2+ on the left side.  ?Pulmonary:  ?   Effort: Pulmonary effort is normal.  ?   Breath sounds: Normal breath sounds. No wheezing.  ?Abdominal:  ?   General: Abdomen is flat. Bowel sounds are normal.  ?   Palpations: Abdomen is soft.  ?   Tenderness: There is no abdominal tenderness.  ?Musculoskeletal:  ?   Cervical back: Normal range of motion and neck supple. No tenderness.  ?   Right ankle: Normal.  ?   Left ankle: Swelling present. No deformity, ecchymosis or lacerations. Tenderness present. Decreased range of motion.  ?   Comments: Decreased range of motion both actively and passively to patient left ankle.  There is significant swelling surrounding the left ankle.  ?Skin: ?   General: Skin is warm and dry.  ?   Capillary Refill: Capillary refill takes less than 2 seconds.  ?   Findings: Erythema present.  ?   Comments: Patient left foot has erythema to volar side concerning for early cellulitis  ?Neurological:  ?   Mental Status: She is alert and oriented to person, place, and time.  ? ? ?ED Results / Procedures / Treatments   ?Labs ?(all labs ordered are listed, but only abnormal results are displayed) ?Labs Reviewed - No data to display ? ?EKG ?None ? ?Radiology ?DG Ankle Left Port ? ?Result Date: 07/05/2021 ?CLINICAL DATA:  Ankle pain and swelling after twisting injury on Sunday. EXAM: PORTABLE LEFT ANKLE - 2 VIEW COMPARISON:  None. FINDINGS: The bones are diffusely under mineralized. Slight irregularity of the distal fibular tip may represent a nondisplaced fracture. No other fracture. Normal alignment, ankle mortise is preserved. No ankle joint effusion. Small plantar calcaneal spur and Achilles tendon enthesophyte. Generalized soft tissue edema about the lateral ankle, soft tissue edema about the dorsum of the included foot. IMPRESSION: 1. Slight irregularity of the distal fibular tip may represent a nondisplaced fracture. 2. Small plantar calcaneal spur and Achilles tendon enthesophyte. 3.  Diffuse soft tissue edema. Electronically Signed   By: Narda Rutherford M.D.   On: 07/05/2021 18:12   ? ?Procedures ?Procedures  ? ? ?Medications Ordered in ED ?Medications  ?cephALEXin (KEFLEX) capsule 500 mg (500 mg Oral Given 07/05/21 2010)  ? ? ?ED Course/ Medical Decision Making/ A&P ?  ?                        ?Medical Decision Making ?Amount and/or Complexity of Data Reviewed ?Radiology: ordered. ? ?Risk ?Prescription drug management. ? ? ?88 year old female presents to ED for evaluation of left ankle pain.  Please see HPI for further details. ? ?On examination, the patient's left ankle is significantly swollen.  There is  no ecchymosis, lacerations.  There is decreased range of motion and tenderness noted to the patient lateral side of her ankle.  Patient neurovascularly intact, 2+ DP pulse to left foot.  Patient compartments of leg and foot are soft, compressible. ? ?Patient worked up utilizing the following imaging studies interpreted by me personally: ?- DG left ankle shows slight irregularity of the distal tip of fibula which is concerning for nondisplaced fracture ? ?Patient placed in cam boot.  Patient will be advised to follow-up with orthopedics.  Patient also be placed on Keflex 5 mg twice daily for 7 days for suspected early cellulitis. ? ?Patient case discussed with my attending Dr. Lorrine Kin who is in agreement with plan of management.  Patient has been advised of the plan and is in agreement.  Patient has had all of her questions answered to her satisfaction.  Patient has been provided with return precautions.  Patient stable at this time for discharge. ? ? ?Final Clinical Impression(s) / ED Diagnoses ?Final diagnoses:  ?Acute left ankle pain  ? ? ?Rx / DC Orders ?ED Discharge Orders   ? ? None  ? ?  ? ? ?  ?Al Decant, PA-C ?07/05/21 2011 ? ?  ?Cathren Laine, MD ?07/10/21 1237 ? ?

## 2021-07-19 ENCOUNTER — Ambulatory Visit (INDEPENDENT_AMBULATORY_CARE_PROVIDER_SITE_OTHER): Payer: Medicare Other | Admitting: Orthopaedic Surgery

## 2021-07-19 ENCOUNTER — Encounter: Payer: Self-pay | Admitting: Orthopaedic Surgery

## 2021-07-19 VITALS — BP 133/51 | HR 77 | Ht 64.0 in | Wt 160.0 lb

## 2021-07-19 DIAGNOSIS — S8262XA Displaced fracture of lateral malleolus of left fibula, initial encounter for closed fracture: Secondary | ICD-10-CM | POA: Diagnosis not present

## 2021-07-19 NOTE — Progress Notes (Signed)
? ?Subjective:  ? ? Patient ID: Andrea [name], female    DOB: 10/04/1925, 88 y.o.   MRN: [id number] ? ?HPI ?She is resident at local nursing home.  She had injury to the left ankle the end of March.  Her left ankle continued to hurt.  She went to the ER on 07-05-21.  X-rays were done and they showed: ?IMPRESSION: ?1. Slight irregularity of the distal fibular tip may represent a ?nondisplaced fracture. ?2. Small plantar calcaneal spur and Achilles tendon enthesophyte. ?3. Diffuse soft tissue edema. ? ?She was given CAM walker and told to come here.  She has no other injury. ? ?Her ankle is better, she has less pain and little swelling now.  She had no other injury. ? ?I have reviewed the ER notes. ? ?I have independently reviewed and interpreted x-rays of this patient done at another site by another physician or qualified health professional. ? ?I last saw her in 2016 for hip fracture and surgery.  She did well from that. ? ? ? ?Review of Systems  ?Constitutional:  Positive for activity change.  ?Musculoskeletal:  Positive for arthralgias, gait problem and joint swelling.  ?All other systems reviewed and are negative. ?For Review of Systems, all other systems reviewed and are negative. ? ?The following is a summary of the past history medically, past history surgically, known current medicines, social history and family history.  This information is gathered electronically by the computer from prior information and documentation.  I review this each visit and have found including this information at this point in the chart is beneficial and informative.  ? ?Past Medical History:  ?Diagnosis Date  ? Hypertension   ? Thyroid disease   ? ? ?Past Surgical History:  ?Procedure Laterality Date  ? ABDOMINAL HYSTERECTOMY    ? ORIF FEMUR FRACTURE Right 11/30/2020  ? Procedure: OPEN REDUCTION INTERNAL FIXATION (ORIF) DISTAL FEMUR FRACTURE;  Surgeon: Myrene GalasHandy, Michael, MD;  Location: [facility] OR;  Service: Orthopedics;  Laterality: Right;  ?  ORIF HIP FRACTURE Left 09/02/2014  ? Procedure: OPEN REDUCTION INTERNAL FIXATION LEFT HIP FRACTURE;  Surgeon: Darreld McleanWayne Obinna Ehresman, MD;  Location: [facility];  Service: Orthopedics;  Laterality: Left;  ? ? ?Current Outpatient Medications on File Prior to Visit  ?Medication Sig Dispense Refill  ? acetaminophen (TYLENOL) 325 MG tablet Take 2 tablets (650 mg total) by mouth every 6 (six) hours as needed for mild pain.    ? amLODipine (NORVASC) 2.5 MG tablet Take 1 tablet (2.5 mg total) by mouth daily.    ? ascorbic acid (VITAMIN C) 1000 MG tablet Take 1 tablet (1,000 mg total) by mouth daily.    ? ASPIRIN ADULT LOW STRENGTH 81 MG EC tablet Take 81 mg by mouth daily.    ? cephALEXin (KEFLEX) 500 MG capsule Take 1 capsule (500 mg total) by mouth 3 (three) times daily. 20 capsule 0  ? diclofenac Sodium (VOLTAREN) 1 % GEL Apply 2 g topically 4 (four) times daily.    ? docusate sodium (COLACE) 100 MG capsule Take 1 capsule (100 mg total) by mouth 2 (two) times daily. 10 capsule 0  ? famotidine (PEPCID) 20 MG tablet Take 1 tablet (20 mg total) by mouth at bedtime.    ? HYDROcodone-acetaminophen (NORCO/VICODIN) 5-325 MG tablet Take 1 tablet by mouth daily as needed for moderate pain. 3 tablet 0  ? levothyroxine (SYNTHROID) 88 MCG tablet Take 88 mcg by mouth daily.    ? melatonin 3 MG TABS tablet Take 1  tablet (3 mg total) by mouth at bedtime.  0  ? Multiple Vitamin (MULTIVITAMIN WITH MINERALS) TABS tablet Take 1 tablet by mouth daily.    ? Vitamin D, Ergocalciferol, (DRISDOL) 1.25 MG (50000 UNIT) CAPS capsule Take 1 capsule (50,000 Units total) by mouth once a week. 5 capsule   ? atorvastatin (LIPITOR) 40 MG tablet Take 1 tablet (40 mg total) by mouth daily at 6 PM. 30 tablet 0  ? ?No current facility-administered medications on file prior to visit.  ? ? ?Social History  ? ?Socioeconomic History  ? Marital status: Divorced  ?  Spouse name: Not on file  ? Number of children: Not on file  ? Years of education: Not on file  ? Highest  education level: Not on file  ?Occupational History  ? Not on file  ?Tobacco Use  ? Smoking status: Never  ? Smokeless tobacco: Never  ?Substance and Sexual Activity  ? Alcohol use: No  ? Drug use: No  ? Sexual activity: Not Currently  ?Other Topics Concern  ? Not on file  ?Social History Narrative  ? Not on file  ? ?Social Determinants of Health  ? ?Financial Resource Strain: Not on file  ?Food Insecurity: Not on file  ?Transportation Needs: Not on file  ?Physical Activity: Not on file  ?Stress: Not on file  ?Social Connections: Not on file  ?Intimate Partner Violence: Not on file  ? ? ?Family History  ?Problem Relation Age of Onset  ? CAD Mother 57  ?     Died of MI  ? Heart failure Father   ? CAD Father   ? ? ?BP (!) 133/51   Pulse 77   Ht 5\' 4"  (1.626 m)   Wt 160 lb (72.6 kg)   BMI 27.46 kg/m?  ? ?Body mass index is 27.46 kg/m?. ? ?   ?Objective:  ? Physical Exam ?Vitals and nursing note reviewed. Exam conducted with a chaperone present.  ?Constitutional:   ?   Appearance: She is well-developed.  ?HENT:  ?   Head: Normocephalic and atraumatic.  ?Eyes:  ?   Conjunctiva/sclera: Conjunctivae normal.  ?   Pupils: Pupils are equal, round, and reactive to light.  ?Cardiovascular:  ?   Rate and Rhythm: Normal rate and regular rhythm.  ?Pulmonary:  ?   Effort: Pulmonary effort is normal.  ?Abdominal:  ?   Palpations: Abdomen is soft.  ?Musculoskeletal:  ?   Cervical back: Normal range of motion and neck supple.  ?     Feet: ? ?Skin: ?   General: Skin is warm and dry.  ?Neurological:  ?   Mental Status: She is alert and oriented to person, place, and time.  ?   Cranial Nerves: No cranial nerve deficit.  ?   Motor: No abnormal muscle tone.  ?   Coordination: Coordination normal.  ?   Deep Tendon Reflexes: Reflexes are normal and symmetric. Reflexes normal.  ?Psychiatric:     ?   Behavior: Behavior normal.     ?   Thought Content: Thought content normal.     ?   Judgment: Judgment normal.  ? ? ? ? ? ?   ?Assessment &  Plan:  ? ?Encounter Diagnosis  ?Name Primary?  ? Avulsion fracture of lateral malleolus of left fibula, closed, initial encounter Yes  ? ?Continue the CAM walker. ? ?Return in two weeks. ? ?X-rays of the left ankle then. ? ?Call if any problem. ? ?Precautions discussed. ? ?  Forms completed for nursing home. ? ?Electronically Signed ?Darreld Mclean, MD ?4/20/202310:01 AM ? ?

## 2021-08-02 ENCOUNTER — Ambulatory Visit (INDEPENDENT_AMBULATORY_CARE_PROVIDER_SITE_OTHER): Payer: Medicare Other | Admitting: Orthopaedic Surgery

## 2021-08-02 ENCOUNTER — Encounter: Payer: Self-pay | Admitting: Orthopaedic Surgery

## 2021-08-02 ENCOUNTER — Ambulatory Visit (INDEPENDENT_AMBULATORY_CARE_PROVIDER_SITE_OTHER): Payer: Medicare Other

## 2021-08-02 DIAGNOSIS — S8262XD Displaced fracture of lateral malleolus of left fibula, subsequent encounter for closed fracture with routine healing: Secondary | ICD-10-CM | POA: Diagnosis not present

## 2021-08-02 NOTE — Progress Notes (Signed)
I do not hurt ? ?She has no pain of the left lateral ankle. She has been using the CAM walker.   ? ?X-rays were done of the left ankle, reported separately. ? ?NV intact. Full ROM of the left ankle.  No pain laterally, no redness. ? ?Encounter Diagnosis  ?Name Primary?  ? Closed avulsion fracture of lateral malleolus of left fibula with routine healing, subsequent encounter Yes  ? ?I will see her as needed. ? ?Stop the CAM walker. ? ?Forms completed for nursing home. ? ?Call if any problem. ? ?Precautions discussed. ? ?Electronically Signed ?Darreld Mclean, MD ?5/4/20239:35 AM ? ?
